# Patient Record
Sex: Male | Born: 1975 | Race: Black or African American | Hispanic: No | Marital: Single | Smoking: Current some day smoker
Health system: Southern US, Community
[De-identification: ages and names within clinical notes are randomized; demographics above are authoritative.]

## PROBLEM LIST (undated history)

## (undated) DIAGNOSIS — J849 Interstitial pulmonary disease, unspecified: Secondary | ICD-10-CM

## (undated) DIAGNOSIS — J9610 Chronic respiratory failure, unspecified whether with hypoxia or hypercapnia: Secondary | ICD-10-CM

## (undated) DIAGNOSIS — I10 Essential (primary) hypertension: Secondary | ICD-10-CM

## (undated) DIAGNOSIS — N529 Male erectile dysfunction, unspecified: Secondary | ICD-10-CM

## (undated) DIAGNOSIS — K859 Acute pancreatitis without necrosis or infection, unspecified: Secondary | ICD-10-CM

## (undated) DIAGNOSIS — E119 Type 2 diabetes mellitus without complications: Secondary | ICD-10-CM

## (undated) DIAGNOSIS — J8289 Other pulmonary eosinophilia, not elsewhere classified: Secondary | ICD-10-CM

## (undated) DIAGNOSIS — Z8701 Personal history of pneumonia (recurrent): Secondary | ICD-10-CM

## (undated) DIAGNOSIS — F319 Bipolar disorder, unspecified: Secondary | ICD-10-CM

## (undated) DIAGNOSIS — R000 Tachycardia, unspecified: Secondary | ICD-10-CM

## (undated) DIAGNOSIS — Z720 Tobacco use: Secondary | ICD-10-CM

## (undated) DIAGNOSIS — E785 Hyperlipidemia, unspecified: Secondary | ICD-10-CM

## (undated) DIAGNOSIS — K219 Gastro-esophageal reflux disease without esophagitis: Secondary | ICD-10-CM

## (undated) DIAGNOSIS — E669 Obesity, unspecified: Secondary | ICD-10-CM

## (undated) HISTORY — DX: Male erectile dysfunction, unspecified: N52.9

## (undated) HISTORY — DX: Essential (primary) hypertension: I10

## (undated) HISTORY — PX: INCISION AND DRAINAGE ABSCESS / HEMATOMA OF BURSA / KNEE / THIGH: SUR668

## (undated) HISTORY — DX: Interstitial pulmonary disease, unspecified: J84.9

## (undated) HISTORY — DX: Hyperlipidemia, unspecified: E78.5

## (undated) HISTORY — DX: Type 2 diabetes mellitus without complications: E11.9

## (undated) HISTORY — DX: Personal history of pneumonia (recurrent): Z87.01

## (undated) HISTORY — PX: APPENDECTOMY: SHX54

## (undated) HISTORY — DX: Bipolar disorder, unspecified: F31.9

## (undated) HISTORY — DX: Gastro-esophageal reflux disease without esophagitis: K21.9

## (undated) HISTORY — PX: KNEE SURGERY: SHX244

---

## 2003-09-11 ENCOUNTER — Emergency Department (HOSPITAL_COMMUNITY): Admission: EM | Admit: 2003-09-11 | Discharge: 2003-09-11 | Payer: Self-pay | Admitting: *Deleted

## 2003-10-20 ENCOUNTER — Ambulatory Visit (HOSPITAL_COMMUNITY): Admission: RE | Admit: 2003-10-20 | Discharge: 2003-10-20 | Payer: Self-pay | Admitting: Pulmonary Disease

## 2003-11-15 ENCOUNTER — Emergency Department (HOSPITAL_COMMUNITY): Admission: EM | Admit: 2003-11-15 | Discharge: 2003-11-15 | Payer: Self-pay | Admitting: Emergency Medicine

## 2004-03-18 ENCOUNTER — Emergency Department (HOSPITAL_COMMUNITY): Admission: EM | Admit: 2004-03-18 | Discharge: 2004-03-18 | Payer: Self-pay | Admitting: Emergency Medicine

## 2004-04-12 ENCOUNTER — Ambulatory Visit (HOSPITAL_COMMUNITY): Admission: RE | Admit: 2004-04-12 | Discharge: 2004-04-12 | Payer: Self-pay | Admitting: Pulmonary Disease

## 2004-05-05 ENCOUNTER — Emergency Department (HOSPITAL_COMMUNITY): Admission: EM | Admit: 2004-05-05 | Discharge: 2004-05-05 | Payer: Self-pay | Admitting: Emergency Medicine

## 2004-05-07 ENCOUNTER — Ambulatory Visit (HOSPITAL_COMMUNITY): Admission: RE | Admit: 2004-05-07 | Discharge: 2004-05-07 | Payer: Self-pay | Admitting: Pulmonary Disease

## 2004-05-14 ENCOUNTER — Emergency Department (HOSPITAL_COMMUNITY): Admission: EM | Admit: 2004-05-14 | Discharge: 2004-05-14 | Payer: Self-pay | Admitting: Emergency Medicine

## 2004-05-27 ENCOUNTER — Emergency Department (HOSPITAL_COMMUNITY): Admission: EM | Admit: 2004-05-27 | Discharge: 2004-05-27 | Payer: Self-pay | Admitting: Emergency Medicine

## 2004-07-06 ENCOUNTER — Inpatient Hospital Stay (HOSPITAL_COMMUNITY): Admission: EM | Admit: 2004-07-06 | Discharge: 2004-07-10 | Payer: Self-pay | Admitting: Emergency Medicine

## 2004-12-28 ENCOUNTER — Inpatient Hospital Stay (HOSPITAL_COMMUNITY): Admission: EM | Admit: 2004-12-28 | Discharge: 2005-01-03 | Payer: Self-pay | Admitting: *Deleted

## 2005-03-17 ENCOUNTER — Emergency Department (HOSPITAL_COMMUNITY): Admission: EM | Admit: 2005-03-17 | Discharge: 2005-03-17 | Payer: Self-pay

## 2006-04-28 ENCOUNTER — Emergency Department (HOSPITAL_COMMUNITY): Admission: EM | Admit: 2006-04-28 | Discharge: 2006-04-28 | Payer: Self-pay | Admitting: Emergency Medicine

## 2006-06-25 ENCOUNTER — Inpatient Hospital Stay (HOSPITAL_COMMUNITY): Admission: EM | Admit: 2006-06-25 | Discharge: 2006-06-29 | Payer: Self-pay | Admitting: *Deleted

## 2006-06-25 ENCOUNTER — Ambulatory Visit: Payer: Self-pay | Admitting: *Deleted

## 2006-07-15 ENCOUNTER — Emergency Department (HOSPITAL_COMMUNITY): Admission: EM | Admit: 2006-07-15 | Discharge: 2006-07-16 | Payer: Self-pay | Admitting: Emergency Medicine

## 2006-07-17 ENCOUNTER — Emergency Department (HOSPITAL_COMMUNITY): Admission: EM | Admit: 2006-07-17 | Discharge: 2006-07-18 | Payer: Self-pay | Admitting: Emergency Medicine

## 2006-07-18 ENCOUNTER — Emergency Department (HOSPITAL_COMMUNITY): Admission: EM | Admit: 2006-07-18 | Discharge: 2006-07-18 | Payer: Self-pay | Admitting: Emergency Medicine

## 2006-07-29 ENCOUNTER — Emergency Department (HOSPITAL_COMMUNITY): Admission: EM | Admit: 2006-07-29 | Discharge: 2006-07-30 | Payer: Self-pay | Admitting: Emergency Medicine

## 2006-08-16 ENCOUNTER — Emergency Department (HOSPITAL_COMMUNITY): Admission: EM | Admit: 2006-08-16 | Discharge: 2006-08-16 | Payer: Self-pay | Admitting: Emergency Medicine

## 2006-08-30 ENCOUNTER — Emergency Department (HOSPITAL_COMMUNITY): Admission: EM | Admit: 2006-08-30 | Discharge: 2006-08-30 | Payer: Self-pay | Admitting: Emergency Medicine

## 2007-05-11 ENCOUNTER — Inpatient Hospital Stay (HOSPITAL_COMMUNITY): Admission: EM | Admit: 2007-05-11 | Discharge: 2007-05-13 | Payer: Self-pay | Admitting: *Deleted

## 2008-03-11 ENCOUNTER — Other Ambulatory Visit: Payer: Self-pay | Admitting: Emergency Medicine

## 2008-03-11 ENCOUNTER — Ambulatory Visit: Payer: Self-pay | Admitting: Psychiatry

## 2008-03-11 ENCOUNTER — Other Ambulatory Visit: Payer: Self-pay

## 2008-03-11 ENCOUNTER — Inpatient Hospital Stay (HOSPITAL_COMMUNITY): Admission: AD | Admit: 2008-03-11 | Discharge: 2008-03-15 | Payer: Self-pay | Admitting: Psychiatry

## 2008-03-12 ENCOUNTER — Other Ambulatory Visit: Payer: Self-pay | Admitting: Emergency Medicine

## 2008-11-29 ENCOUNTER — Emergency Department (HOSPITAL_COMMUNITY): Admission: EM | Admit: 2008-11-29 | Discharge: 2008-11-30 | Payer: Self-pay | Admitting: Emergency Medicine

## 2010-06-22 ENCOUNTER — Encounter: Payer: Self-pay | Admitting: Pulmonary Disease

## 2010-09-08 LAB — BASIC METABOLIC PANEL
BUN: 7 mg/dL (ref 6–23)
CO2: 27 mEq/L (ref 19–32)
Calcium: 9.7 mg/dL (ref 8.4–10.5)
Chloride: 102 mEq/L (ref 96–112)
Creatinine, Ser: 0.72 mg/dL (ref 0.4–1.5)
GFR calc Af Amer: >60 mL/min (ref >60)
GFR calc non Af Amer: >60 mL/min (ref >60)
Glucose, Bld: 131 mg/dL — ABNORMAL HIGH (ref 70–99)
Potassium: 3.5 mEq/L (ref 3.5–5.1)
Sodium: 137 mEq/L (ref 135–145)

## 2010-09-08 LAB — URINALYSIS, ROUTINE W REFLEX MICROSCOPIC
Bilirubin Urine: NEGATIVE
Glucose, UA: NEGATIVE mg/dL
Hgb urine dipstick: NEGATIVE
Ketones, ur: NEGATIVE mg/dL
Nitrite: NEGATIVE
Protein, ur: NEGATIVE mg/dL
Specific Gravity, Urine: 1.02 (ref 1.005–1.030)
Urobilinogen, UA: 1 mg/dL (ref 0.0–1.0)
pH: 8.5 — ABNORMAL HIGH (ref 5.0–8.0)

## 2010-09-08 LAB — DIFFERENTIAL
Basophils Absolute: 0.1 10*3/uL (ref 0.0–0.1)
Basophils Relative: 1 % (ref 0–1)
Eosinophils Absolute: 0.6 10*3/uL (ref 0.0–0.7)
Eosinophils Relative: 6 % — ABNORMAL HIGH (ref 0–5)
Lymphocytes Relative: 23 % (ref 12–46)
Lymphs Abs: 2.3 10*3/uL (ref 0.7–4.0)
Monocytes Absolute: 1.2 10*3/uL — ABNORMAL HIGH (ref 0.1–1.0)
Monocytes Relative: 11 % (ref 3–12)
Neutro Abs: 6 10*3/uL (ref 1.7–7.7)
Neutrophils Relative %: 59 % (ref 43–77)

## 2010-09-08 LAB — CBC
HCT: 39.4 % (ref 39.0–52.0)
Hemoglobin: 14.1 g/dL (ref 13.0–17.0)
MCHC: 35.7 g/dL (ref 30.0–36.0)
MCV: 93.4 fL (ref 78.0–100.0)
Platelets: 160 10*3/uL (ref 150–400)
RBC: 4.22 MIL/uL (ref 4.22–5.81)
RDW: 14.4 % (ref 11.5–15.5)
WBC: 10.1 10*3/uL (ref 4.0–10.5)

## 2010-10-15 NOTE — H&P (Signed)
[name], [name]           ACCOUNT NO.:  [id number]   MEDICAL RECORD NO.:  [id number]          PATIENT TYPE:  INP   LOCATION:  A218                          FACILITY:  [facility]   PHYSICIAN:  Edward L. Juanetta Gosling, M.D.DATE OF BIRTH:  03-Oct-1975   DATE OF ADMISSION:  05/10/2007  DATE OF DISCHARGE:  LH                              HISTORY & PHYSICAL   [name] is a 35 year old African American male who came to the  emergency room with nausea and vomiting and was found to have diabetic  ketoacidosis.  He has a long known history of diabetes.  He has been on  insulin.  He has bipolar disease, so he frequently is noncompliant with  his medications.  He does live with his mother, but she has little  ability to get him to cooperate with medical treatment.  He says he had  been sick for a few days.  He has had nausea and vomiting and just does  not feel well. He has not had any fever.  He has not had any infection,  as far as he knows.   PAST MEDICAL HISTORY:  1. Diabetes.  2. Bipolar disorder.  3. He is had an abscess on his buttocks in the past.   FAMILY HISTORY:  Positive for hypertension, blood dyscrasias, diabetes,  and coronary disease.   PAST SURGICAL HISTORY:  1. He has had an appendectomy.  2. He has had some sort of an orthopedic procedure on his leg.   SOCIAL HISTORY:  He lives at home with his mother.  He does not drink  any alcohol.  He does not use any illicit drugs.  He does smoke about a  package of cigarettes or so a day, and has for several years.   He has no known drug allergies.   PHYSICAL EXAMINATION:  GENERAL:  He is awake and alert.  Says he is more  comfortable.  VITAL SIGNS:  Temperature is 98.1, pulse 116, respirations 20, blood  pressure 135/75, O2 saturations 98% on room air.  His last blood sugar  was 188.  HEENT:  His mucous membranes are slightly dry.  His pupils do react.  His nose and throat are clear otherwise.  NECK:  Supple, without  masses, bruits, or jugular venous distention.  CHEST:  Fairly clear, with some mild rhonchi bilaterally.  HEART:  Regular, without gallop.  ABDOMEN:  Soft.  No masses are felt.  He has minimal tenderness.  No  rebound tenderness.  Bowel sounds are present and active.  EXTREMITIES:  Showed no edema.  CENTRAL NERVOUS SYSTEM:  Grossly intact.   LABORATORY WORK:  CBC shows white count 8700, hemoglobin 16.8, platelets  220.  Urine negative.  Urine microscopic negative.  Comprehensive  metabolic profile showed his blood sugar was 743, BUN of 13, creatinine  1.71, bilirubin is 1.9, alkaline phosphatase 165, lipase 29.  Ketones  positive, small amount.  Chest x-ray not done yet.   ASSESSMENT:  He has diabetic ketoacidosis.  He is a smoker.  I am going  to go ahead and get a chest x-ray to be sure that  he has not had  pneumonia that set this off, although he does not have an elevated white  blood cell count.  He has been on an insulin drip.  He is now going to  be on a sliding scale with some Lantus.  I am going to have him  reevaluated by mental health.  He has not been terribly cooperative with  treatments from them, and I will otherwise see how he does.      Edward L. Juanetta Gosling, M.D.  Electronically Signed     ELH/MEDQ  D:  05/11/2007  T:  05/11/2007  Job:  [id number]

## 2010-10-15 NOTE — Group Therapy Note (Signed)
NAMETRINTON, PREWITT           ACCOUNT NO.:  [id number]   MEDICAL RECORD NO.:  [id number]          PATIENT TYPE:  INP   LOCATION:  A325                          FACILITY:  [facility]   PHYSICIAN:  Edward L. Juanetta Gosling, M.D.DATE OF BIRTH:  15-Jun-1975   DATE OF PROCEDURE:  DATE OF DISCHARGE:                                 PROGRESS NOTE   Mr. Wettstein was admitted with diabetic ketoacidosis.  He had also had  significant nausea and vomiting.  I am going to get a ultrasound of the  abdomen.  He says he is better.  He is not nauseated.  He is not  vomiting.  His blood sugar is better but still in the 200s.  I had a  long discussion with him about what is going on with his bipolar disease  and he says he is having no hallucinations.  He is not having any other  problems.  In the past, when he has had difficulty with hallucinations,  he has freely admitted it, and I do not do not know exactly what is  going on.  His mother, with whom he lives, says that he is having  terrible problems.  He says he is not.  He says he is taking his  insulin.  As best I can tell he has not been evaluated by the ACT team  yet.  I have told him he is going to have to have a psychiatric  evaluation to try to decide for sure what is going on.  I do not think  he is quite ready for discharge but he is getting close.      Edward L. Juanetta Gosling, M.D.  Electronically Signed     ELH/MEDQ  D:  05/12/2007  T:  05/12/2007  Job:  [id number]

## 2010-10-15 NOTE — Group Therapy Note (Signed)
[name], [name]           ACCOUNT NO.:  [id number]   MEDICAL RECORD NO.:  [id number]          PATIENT TYPE:  INP   LOCATION:  A325                          FACILITY:  [facility]   PHYSICIAN:  Edward L. Juanetta Gosling, M.D.DATE OF BIRTH:  [birth year]   DATE OF PROCEDURE:  DATE OF DISCHARGE:                                 PROGRESS NOTE   Mr. [name] seems to be doing better.  He has no new complaints.  His  blood sugars are now around 250, not perfect but certainly better.  He  is not in DKA.  He has had evaluation by the ACT team.  He does not need  to be committed, but they have had some suggestions for his home  situation and for followup with mental health.   EXAM:  Temperature is 98.3, pulse 69, respirations 20, blood pressure  114/66, blood sugar 217.  His chest is clear.  ABDOMEN:  Soft.  He looks very comfortable.   ASSESSMENT:  He is going to be discharged home.  Please see discharge  summary for details.      Edward L. Juanetta Gosling, M.D.  Electronically Signed     ELH/MEDQ  D:  05/13/2007  T:  05/13/2007  Job:  [id number]

## 2010-10-18 NOTE — H&P (Signed)
NAMEEVARISTO, TSUDA           ACCOUNT NO.:  [id number]   MEDICAL RECORD NO.:  [id number]          PATIENT TYPE:  INP   LOCATION:  A213                          FACILITY:  [facility]   PHYSICIAN:  Kingsley Callander. Ouida Sills, MD       DATE OF BIRTH:  12/06/1975   DATE OF ADMISSION:  07/06/2004  DATE OF DISCHARGE:  LH                                HISTORY & PHYSICAL   CHIEF COMPLAINT:  Vomiting.   HISTORY OF THE PRESENT ILLNESS:  This patient is a 35 year old African-  American male with bipolar disorder who presented to the emergency room  after vomiting twice today.  On evaluation, he was found to be hyperglycemia  at 874.  He admits recent polyuria, polydipsia and polyphagia.  He has had  fluctuation in his weight.  He also had a recent abscess on his left buttock  which was incised and drained by Dr. Margretta Ditty here in the emergency room.  He has not experienced fever or diarrhea.  He has felt constipated and has  not had a bowel movement in approximately a week.   PAST MEDICAL HISTORY:  1.  Bipolar disorder.  2.  Appendectomy.  3.  Seizures as a child.  4.  Asthma.   MEDICATIONS:  1.  Risperdal 1 mg b.i.d.  2.  Clonazepam 0.5 mg b.i.d.  3.  Depakote ER 1500 mg at bedtime.  4.  __________ 50 mg q.2w.   ALLERGIES:  None.   SOCIAL HISTORY:  He smokes cigarettes but does not drink alcohol or use  recreational drugs now.   FAMILY HISTORY:  His mother has diabetes.   REVIEW OF SYSTEMS:  Noncontributory.   PHYSICAL EXAMINATION:  VITAL SIGNS:  Temperature 98.4, pulse 101,  respirations 16, blood pressure 153/87.  GENERAL:  Alert and fully oriented.  HEENT:  The eyes are unremarkable.  The oropharynx is dry.  The neck is  supple without JVD or thyromegaly.  LUNGS:  Clear.  HEART:  Tachycardic with no murmurs.  ABDOMEN:  Soft, nontender, with no hepatosplenomegaly.  EXTREMITIES:  No cyanosis, clubbing or edema.  NEUROLOGIC:  Grossly intact.  SKIN:  He has a wound on his left buttock  where he has undergone an incision  and drainage and packing of a boil.   LABORATORY DATA:  Sodium 120, potassium 4.6, chloride 80, bicarb 27, glucose  874, BUN 13, creatinine 1.4, calcium 9.4, albumin 4.1.  Urinalysis reveals  trace ketones and no protein.  White count 7.9, hemoglobin 15.2, platelets  208.   IMPRESSION:  1.  New-onset type 2 diabetes.  He is being hydrated with normal saline, an      insulin drip has been started, he will be hospitalized and monitored      closely with hourly Accu-Cheks and modification of his insulin doses and      fluids.  His MET-7 will be repeated.  2.  Left buttock boil.  We will continue dressing changes and obtain surgery      consultation if needed.  3.  Bipolar disorder.  Continue Risperdal and Depakote ER.  4.  Smoker.  A Nicoderm patch will be provided.  5.  History of seizures.  6.  History of asthma.  7.  Hyponatremia.      ROF/MEDQ  D:  07/06/2004  T:  07/06/2004  Job:  [id number]   cc:   Ramon Dredge L. Juanetta Gosling, M.D.  [address]  [place]  [place] [address]  Fax: [phone]

## 2010-10-18 NOTE — Discharge Summary (Signed)
NAMEAMAL, [name]           ACCOUNT NO.:  [id number]   MEDICAL RECORD NO.:  [id number]          PATIENT TYPE:  INP   LOCATION:  A325                          FACILITY:  [facility]   PHYSICIAN:  Edward L. Juanetta Gosling, M.D.DATE OF BIRTH:  [birth year]   DATE OF ADMISSION:  05/10/2007  DATE OF DISCHARGE:  12/11/2008LH                               DISCHARGE SUMMARY   FINAL DISCHARGE DIAGNOSES:  1. Diabetic ketoacidosis.  2. Longstanding history of noncompliance with medications.  3. Bipolar disease.  4. History of abscess of the buttocks.  5. Nausea and vomiting likely related to DKA.  6. Mild acute renal insufficiency   HISTORY OF PRESENT ILLNESS:  Mr. [name] is a 35 year old African  American male who came to the emergency room and nausea, vomiting and  was found to be in DKA.  He has a long known history of diabetes. He has  been on insulin,  but he has bipolar disease so he is frequently  noncompliant with his medications both for his bipolar disease and for  his diabetes.  He has not had any recent active hallucinations, but he  has been uncooperative with his care.  He does live at home with his  mother and they have been budding head  over his care and he has  become resistant to any of her efforts to try to make him take his  medications.   PHYSICAL EXAMINATION:  Exam on admission:  VITAL SIGNS:  Temperature  is 98.1, pulse 116, respirations 20, blood  pressure 135/75, O2 sat 98% on room air.  HEENT: Mucous membranes were dry.  Pupils are reactive.  CHEST:  His chest showed some mild rhonchi bilaterally.  HEART:  Heart was regular without gallop.  ABDOMEN:  His abdomen was soft.  No real tenderness.  No evidence of any  sort of obstruction.   LABORATORY DATA:  Lab work white count 8700, hemoglobin 16.8, platelets  were 220,000.  His blood sugar was 743 on admission. BUN 13, creatinine  1.71  ketones were positive.   HOSPITAL COURSE:  He was started on insulin and showed  marked  improvement.  His blood sugar came under control very quickly.  He was  taken off of the insulin drip.  The rest of his hospitalization was  spent trying to make sure that he was safe at home. He had an evaluation  by the ACT team and it was felt that he was not suicidal. He did not  need to be committed and he was therefore discharged home on the 11th  to continue with his previous medications at home, if he will take them,  which of course is somewhat questionable.  He may need to be on Lantus.  He has been on a sliding scale of regular insulin, but this just does  not appear to me that we are going to have a good  solution to his problem.  I do not think he is competent, but I do think  he is resistant to taking his medications.  He is going to be on 70/30  insulin, 70 units b.i.d.  He  is going to be back on Depakote that he  takes through the health department. He had been receiving long-acting  Risperdal through my office and I am hopeful that he will take that.      Edward L. Juanetta Gosling, M.D.  Electronically Signed     ELH/MEDQ  D:  05/28/2007  T:  05/28/2007  Job:  [id number]

## 2010-10-18 NOTE — Group Therapy Note (Signed)
NAMEJACQUEZ, [name]           ACCOUNT NO.:  [id number]   MEDICAL RECORD NO.:  [id number]          PATIENT TYPE:  INP   LOCATION:  A215                          FACILITY:  [facility]   PHYSICIAN:  Edward L. Juanetta Gosling, M.D.DATE OF BIRTH:  10-05-1975   DATE OF PROCEDURE:  12/31/2004  DATE OF DISCHARGE:                                   PROGRESS NOTE   PROBLEM LIST:  1.  Diabetes.  2.  Bipolar disease.   SUBJECTIVE:  [name] says he is feeling a little better.  His blood  sugar, however, is still not controlled.  He has no new complaints.   OBJECTIVE:  VITAL SIGNS:  His temperature 97, pulse 77, respirations 20,  blood sugar 282.  It has been 280, then 282.  Blood pressure 121/64.  CHEST:  His chest is clear.  HEART:  Regular.  He looks more comfortable.   ASSESSMENT:  He has diabetes which is not well-controlled.   PLAN:  Continue his current treatments.  Increase his insulin by 5 units in  the morning and evening and continue sliding scale diabetic teaching,  although I am not sure how much he will absorb of that considering his  severe bipolar disease.       ELH/MEDQ  D:  12/31/2004  T:  12/31/2004  Job:  [id number]

## 2010-10-18 NOTE — Op Note (Signed)
[name], [name]           ACCOUNT NO.:  [id number]   MEDICAL RECORD NO.:  [id number]          PATIENT TYPE:  INP   LOCATION:  A213                          FACILITY:  [facility]   PHYSICIAN:  Barbaraann Barthel, M.D. DATE OF BIRTH:  12-31-1975   DATE OF PROCEDURE:  07/08/2004  DATE OF DISCHARGE:                                 OPERATIVE REPORT   PREOPERATIVE DIAGNOSIS:  Gluteal abscess, left gluteus.   POSTOPERATIVE DIAGNOSIS:  Gluteal abscess, left gluteus.   PROCEDURE:  Incision and drainage and debridement of left gluteal abscess.   SURGEON:  Barbaraann Barthel, M.D.   SPECIMENS:  Cultures.   NOTE:  This is a 35 year old black male who presented to the emergency room  with a gluteal abscess, and he was seen by the medical service for a recent  diagnosis of diabetes for management.  He had an abscess on his left gluteus  partially drained.  It required further drainage.  We made arrangements and  discussed this with the patient and his mother.  We discussed the  complications not limited to but including bleeding, infection, the  possibility that more debridement may be required.  Informed consent was  obtained.  The patient was begun on antibiotics, and we planned to take him  down to the minor room in the surgery suite for treatment.   TECHNIQUE:  The patient was placed in a prone position.  After prepping with  Betadine solution and draping in the usual manner, 1% Xylocaine was used to  anesthetize the area around the wound.  The wound was then extended and  opened in a T-like manner in order to remove the loculations that were  superior.  These were then debrided sharply and the wound was irrigated with  normal saline solution and the bleeding was controlled with the cautery  device.  The wound was then packed open with iodoform gauze and a sterile  dressing was applied.  Prior to closure, all sponge, needle and instrument  counts were found to be correct.  Estimated blood  loss was minimal.  The  patient received 8 mL of 1% Xylocaine without epinephrine.  There were no  complications.   The patient is returned to the floor without a change of service, and Dr.  Juanetta Gosling will continue to see him and treat him for his diabetes, etc.      WB/MEDQ  D:  07/08/2004  T:  07/08/2004  Job:  [id number]   cc:   Ramon Dredge L. Juanetta Gosling, M.D.  [address]  [place]  [place] [address]  Fax: [phone]

## 2010-10-18 NOTE — Consult Note (Signed)
NAMEYEHIA, MCBAIN NO.:  [id number]   MEDICAL RECORD NO.:  [id number]           PATIENT TYPE:  INP   LOCATION:  A213                          FACILITY:  [facility]   PHYSICIAN:  Barbaraann Barthel, M.D. DATE OF BIRTH:  1975/08/06   DATE OF CONSULTATION:  07/08/2004  DATE OF DISCHARGE:                                   CONSULTATION   Surgery was asked to see this 35 year old black male who has an abscess on  his left gluteus.  In essence, he is a type 2 diabetic on a sliding scale at  the present, who was admitted to the medical service and he was found to  have an abscess on his left gluteal area.  This was seen in the emergency  room and partially drained.  He does not recall how long he has had this  abscess.  This was not completely drained and packed  in the emergency room.  This needs further debridement.  We can do this under local anesthesia in  the minor room and I have discussed this with him.  An informed consent was  obtained.   PAST MEDICAL HISTORY:  Please see Dr. Juanetta Gosling admission note.  But in  essence, I remember this patient from 18 when he presented to me with  acute appendicitis.  Other medical problems have included a history of  seizure disorder and bipolar problems.   PLAN:  We will initiate antibiotics at this time and plan for an I&D in the  minor room down in surgery as soon as this is available to me.      WB/MEDQ  D:  07/08/2004  T:  07/08/2004  Job:  [id number]   cc:   Ramon Dredge L. Juanetta Gosling, M.D.  [address]  [place]  [place] [address]  Fax: [phone]

## 2010-10-18 NOTE — Group Therapy Note (Signed)
[name], [name]           ACCOUNT NO.:  [id number]   MEDICAL RECORD NO.:  [id number]          PATIENT TYPE:  INP   LOCATION:  A215                          FACILITY:  [facility]   PHYSICIAN:  Edward L. Juanetta Gosling, M.D.DATE OF BIRTH:  03-15-1976   DATE OF PROCEDURE:  12/30/2004  DATE OF DISCHARGE:                                   PROGRESS NOTE   PROBLEM:  Diabetes with DKA.   SUBJECTIVE:  [name] says he feels much better.  He is able to eat  now.  He has been able to keep some medicine down.   OBJECTIVE:  VITAL SIGNS:  His temperature 97.8, pulse 107, respirations 18.  Blood sugars 273.  Blood pressure 111/62.  CHEST:  Clear.  HEART:  Regular.  ABDOMEN:  Soft.   Liver function this morning normal.  CBC shows white count 8700, hemoglobin  17.  BMET shows his sodium is 130, potassium 3.2, CO2 is 20, which is  normal, BUN 13, creatinine 1.3.   ASSESSMENT:  Even though he does have elevated blood sugars, he appears to  be out of the diabetic ketoacidosis.   PLAN:  I am going to go ahead with reducing and tapering off the insulin  drip.  Replace his potassium.  Try to get him up and moving around.  Plans  for home are problematic because of his mental illness and his poor ability  to manage, but we will see what help we can get him at home.       ELH/MEDQ  D:  12/30/2004  T:  12/30/2004  Job:  [id number]

## 2010-10-18 NOTE — Discharge Summary (Signed)
NAMELEVONE, OTTEN           ACCOUNT NO.:  [id number]   MEDICAL RECORD NO.:  [id number]          PATIENT TYPE:  INP   LOCATION:  A213                          FACILITY:  [facility]   PHYSICIAN:  Edward L. Juanetta Gosling, M.D.DATE OF BIRTH:  07-11-1975   DATE OF ADMISSION:  07/06/2004  DATE OF DISCHARGE:  02/08/2006LH                                 DISCHARGE SUMMARY   FINAL DISCHARGE DIAGNOSES:  1.  New onset diabetes.  2.  Abscess of buttocks.  3.  Bipolar disorder.  4.  Hyponatremia.   HISTORY:  This is a 35 year old who has a known history of bipolar disorder,  who came to the emergency room after vomiting.  When he was seen in the  emergency room, he was hyperglycemia at 874.  He has had polyuria,  polydipsia, and polyphagia.  He has had some marked fluctuation in his  weight.  He developed an abscess on his left buttock which was incised and  drained in the emergency room.  He has had his blood sugar checked several  times because of a very positive family history of diabetes.   He is taking:  1.  Risperdal 1 mg b.i.d.  2.  Clonazepam 0.5 mg b.i.d.  3.  Depakote ER 1500 mg at bedtime.  4.  A long-acting version of Risperdal 50 mg every two weeks.   PHYSICAL EXAMINATION:  VITAL SIGNS:  Show his temp is 98.4, pulse 101,  respirations 16, blood pressure 153/87.  CHEST:  Clear.  HEART:  Tachycardic.  No murmurs.  ABDOMEN:  Soft.  BACK: He had a wound on his left buttock where he had the incision and  drainage done.   LABORATORY WORK:  Showed a sodium of 120, potassium 4.6, chloride 80, bicarb  27, glucose 874, BUN 13, creatinine 1.4.  White blood count 7,900,  hemoglobin 15.2.   Assessment at the time of admission was that he had new onset type 2  diabetes.  He was treated with intravenous fluids and given insulin drip.  He was put on a Nicoderm patch.  He was improved over the next several days.  His blood sugar came down into the 200-400 range, but he became very anxious  about being in the hospital and even though it was not the ideal situation,  I went ahead and discharged him home with home health services and to have  his mother who is also diabetic to help with management of his diabetes.  I  was concerned that he would check out AMA if I did not go ahead and  discharge him.  At least this way, he will have a good followup.   He is discharged home on:  1.  Humulin N 30 units in the morning and 25 units in the evening.  2.  Humalog by sliding scale:  Greater than 450 - 12 units; 400 to 450 - 10      units; 350 to 399 - 8 units; 300 to 349 - 6 units; 250 to 299 - 4 units;      200 to 249 - 2 units; less than 200 -  0 units.  3.  He is going to take Darvocet-N 100 one q.6h. p.r.n. pain.  4.  Cipro 500 mg b.i.d. x 5 days.   He is going to have home health followup.   He is going to check his blood sugars four times a day.  He has an Accu-Chek  machine.  He knows how to use it.   He is going to be on an 1800 calorie ADA diet and then follow up after that.      ELH/MEDQ  D:  07/10/2004  T:  07/10/2004  Job:  [id number]

## 2010-10-18 NOTE — Group Therapy Note (Signed)
NAMEJAYLON, BOYLEN           ACCOUNT NO.:  [id number]   MEDICAL RECORD NO.:  [id number]          PATIENT TYPE:  INP   LOCATION:  A306                          FACILITY:  [facility]   PHYSICIAN:  Edward L. Juanetta Gosling, M.D.DATE OF BIRTH:  10/13/1975   DATE OF PROCEDURE:  DATE OF DISCHARGE:  01/03/2005                                   PROGRESS NOTE   PROBLEM:  Diabetes.   SUBJECTIVE:  Mr. Ricketson says he feels much better.  He has no new  complaints.   His exam shows that his temperature is 97.7, pulse is 72, respirations are  20.  His blood sugar this morning 135.  Blood pressure 109/63.  O2 sats 99%  on room air.  His chest is clear.  His heart is regular. His abdomen is  soft.   His ultrasound was negative.   ASSESSMENT:  He is much improved.  Bipolar disease  does not appear to be  causing hallucinations, at least now.   My plan is to discharge him home.  Please see discharge summary for details.       ELH/MEDQ  D:  01/03/2005  T:  01/03/2005  Job:  [id number]
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
## 2010-10-18 NOTE — Group Therapy Note (Signed)
NAMEOTIS, PORTAL           ACCOUNT NO.:  [id number]   MEDICAL RECORD NO.:  [id number]          PATIENT TYPE:  INP   LOCATION:  A306                          FACILITY:  [facility]   PHYSICIAN:  Edward L. Juanetta Gosling, M.D.DATE OF BIRTH:  Sep 05, 1975   DATE OF PROCEDURE:  01/01/2005  DATE OF DISCHARGE:                                   PROGRESS NOTE   PROBLEM:  Diabetes.   SUBJECTIVE:  Mr. Latour overall is about the same.  His blood sugar this  morning is 216, so he is getting closer to being controlled.  He says that  his mother was concerned that he his abdomen appears to be enlarged.  His  abdomen is soft.  He does not have any peripheral edema, but he does  complain of some constipation, perhaps he has just not evacuated his bowels.  His mother also wants him switch to something that he can take one shot a  day which I think may be possible once he gets home and gets stabilized, but  right now he is on two long-acting injections plus sliding scale and he is  not controlled, so I don't think that switching him to something like Lantus  is feasible or advisable.   PHYSICAL EXAMINATION:  GENERAL:  His exam shows he is awake and alert.  VITAL SIGNS:  Temperature is 98.2, pulse 81, respirations 20, blood sugar  216, blood pressure 116/67, O2 saturations 99%.   ASSESSMENT:  She is doing well.   PLAN:  Plan is to continue treatments and follow.  Hopefully, his sugars  will come around the 200 mark all the time and I will be able to let him go  home soon.       ELH/MEDQ  D:  01/01/2005  T:  01/01/2005  Job:  [id number]

## 2010-10-18 NOTE — Group Therapy Note (Signed)
NAMEBERTEL, VENARD           ACCOUNT NO.:  [id number]   MEDICAL RECORD NO.:  [id number]          PATIENT TYPE:  INP   LOCATION:  A213                          [facility]ACILITY:  [facility]   PHYSICIAN:  Edward L. Juanetta Gosling, M.D.DATE OF BIRTH:  1975-06-03   DATE OF PROCEDURE:  DATE OF DISCHARGE:                                   PROGRESS NOTE   Mr. Melucci is about the same.  He is still hyperglycemic but better.  He  has no new complaints.  His temp is 97.4, pulse 75, respirations 18.  Blood  sugar this morning is 244.  Blood pressure 133/76.  His chest is much  clearer.  His heart is regular.  His BMET this morning shows potassium is up  to 4, and sodium is 133.   ASSESSMENT:  He is better.   PLAN:  I am going to discontinue the insulin drip, try go get him on some  long-acting insulin and see if he tolerates this.  I do not have any new  treatment planned.  I will ask Dr. Malvin Johns to see him for his abscess on  his buttocks.      ELH/MEDQ  D:  07/08/2004  T:  07/08/2004  Job:  [id number]

## 2010-10-18 NOTE — Group Therapy Note (Signed)
[name]           ACCOUNT NO.:  [id number]   MEDICAL RECORD NO.:  [id number]          PATIENT TYPE:  INP   LOCATION:  A213                          [facility]ACILITY:  [facility]   PHYSICIAN:  [name], M.D.DATE OF BIRTH:  02-21-1976   DATE OF PROCEDURE:  07/09/2004  DATE OF DISCHARGE:                                   PROGRESS NOTE   PROBLEM:  Diabetes, abscess on buttocks.   SUBJECTIVE:  [name] says he feels better this morning but he is still  having some problems.   PHYSICAL EXAMINATION:  VITAL SIGNS:  Blood pressure 113/65, pulse 69,  respirations 20, temperature 97.5.  His blood sugar 268, was 422.  I want to  add increasing doses of Humulin N today.  He is to receive his diabetic  teaching, start using his own monitor, etc.  PT has been consulted for help  with his buttocks.  Otherwise, will plan to continue with his treatments and  no other changes today.  CHEST:  Fairly clear without wheezes.  HEART:  Regular.  ABDOMEN:  Soft.   ASSESSMENT:  He is better.   PLAN:  Continue with his treatments and follow.      ELH/MEDQ  D:  07/09/2004  T:  07/09/2004  Job:  [id number]

## 2010-10-18 NOTE — Discharge Summary (Signed)
[name], [name]           ACCOUNT NO.:  [id number]   MEDICAL RECORD NO.:  [id number]          PATIENT TYPE:  INP   LOCATION:  A306                          [facility]ACILITY:  [facility]   PHYSICIAN:  Edward L. Juanetta Gosling, M.D.DATE OF BIRTH:  11-08-1975   DATE OF ADMISSION:  12/28/2004  DATE OF DISCHARGE:  08/04/2006LH                                 DISCHARGE SUMMARY   FINAL DISCHARGE DIAGNOSES:  1.  Diabetic ketoacidosis.  2.  Bipolar disorder.  3.  Tobacco abuse.   HISTORY OF PRESENT ILLNESS:  [name] is a 35 year old who has history  of diabetes that was diagnosed February 2006.  He had an abscess on his left  leg at that time that was incised and drained.  He was discharged home, was  on insulin, but somehow misunderstood instructions and was thought that he  was told that he should stop his insulin.  He had stopped his insulin for  about a month, came to my office, was found to have a blood sugar then of  about 400 on no medications.  He was started on Glucovance, but continued  having problems and came to the emergency room.  He was also given  Phenergan.  He had been seen at [facility] with his problems with his  bipolar disease, but he has hallucinations which are active with his bipolar  disease.  He has been receiving Risperdal injections in my office, but he is  not following with a psychiatrist at this point.   PHYSICAL EXAMINATION:  GENERAL APPEARANCE:  Well-developed, well-nourished,  mildly obese male who is in no acute distress.  VITAL SIGNS:  Blood pressure 133/84, weight 250.3, pulse 80, temperature  97.6.  HEENT:  Mucous membranes moist.  NECK:  Supple.  CHEST:  Decreased breath sounds.  HEART:  Regular.  ABDOMEN:  Obese, soft, nontender.  He had no edema.  Pulses were normal.  NEUROLOGICAL:  Normal except for his hallucinations.   LABORATORY DATA:  Blood sugar was very elevated with a low CO2 suggesting  BKA.  He was admitted and started on insulin drip.  His blood sugars came  under better control, and then he was titrated with Humulin N.  By the time  of discharge, his fasting blood sugar was in the 140's, and he is going to  be discharged home on Humulin N 45  units b.i.d.  He is going to continue with a sliding scale of Humulin R that  he already has at home.  He is going to check his blood sugars four times a  day.  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       ELH/MEDQ  D:  01/03/2005  T:  01/03/2005  Job:  [id number]

## 2010-10-18 NOTE — Group Therapy Note (Signed)
[name], [name]           ACCOUNT NO.:  [id number]   MEDICAL RECORD NO.:  [id number]          PATIENT TYPE:  INP   LOCATION:  A213                          [facility]ACILITY:  [facility]   PHYSICIAN:  Edward L. Juanetta Gosling, M.D.DATE OF BIRTH:  12/02/1975   DATE OF PROCEDURE:  07/07/2004  DATE OF DISCHARGE:                                   PROGRESS NOTE   SUBJECTIVE:  [name] came in yesterday with new onset diabetes.  He  has an abscess on his buttocks as well.  He says he is feeling a little  better now.  He has no new complaints.   PHYSICAL EXAMINATION:  VITAL SIGNS:  Temperature 97.8, pulse 82,  respirations 18, blood sugar was 317, blood pressure 106/66.  CHEST:  Relatively clear.  The abscessed area looks apparently better than  before.   LABORATORY DATA:  This morning, his sodium is 123, potassium 3.4, glucose  408 on the BMET, BUN 11, creatinine 1.   ASSESSMENT:  He has diabetes with very high blood sugars.  He is  hypokalemic.  He has mental illness which may be bipolar disease may be  schizophrenia, and he has a buttocks abscess.  I have discussed this with  him and told him that I think he is going to need to have an evaluation of  his abscess by a surgeon.  He is going to wait and talk to his family about  who they want to see him.  In the meantime, we will do wet-to-dry dressings,  get some teaching, continue with his insulin and continuous infusion,  replace his potassium, recheck labs tomorrow.      ELH/MEDQ  D:  07/07/2004  T:  07/07/2004  Job:  [id number]

## 2010-10-18 NOTE — H&P (Signed)
[name], [name]           ACCOUNT NO.:  [id number]   MEDICAL RECORD NO.:  [id number]          PATIENT TYPE:  INP   LOCATION:  A215                          [facility]ACILITY:  [facility]   PHYSICIAN:  [name], M.D.DATE OF BIRTH:  01/16/1976   DATE OF ADMISSION:  12/28/2004  DATE OF DISCHARGE:  LH                                HISTORY & PHYSICAL   HISTORY OF PRESENT ILLNESS:  The patient is a 35 year old African gentleman  who carries the diagnosis of diabetes mellitus diagnosed in February of 2006  when he presented to the hospital with a glucose of 874, and at that time  had a left buttock abscess which was incised and drained. He was discharged  home to be followed as an outpatient. He was apparently discharged on  Insulin which the patient stated he took for a while but then discontinued  it as his sugars were controlled. Apparently he lives with his mother who  assists with giving him his medication. He also carries a diagnosis of  bipolar disorder though the patient describes hallucinations which are  persistent despite his medications. He is followed by Mental Health as well,  though I am not sure he is compliant with follow up. In any event, he  presented to the emergency room with a several-day history of nausea and  vomiting, abdominal pain. He was seen by his primary care physician on the  day prior to admission and started on Glucovance. He was given Phenergan as  well but his symptoms persisted. The patient also gives a history of poor  appetite, fatigue, polyuria and decreased p.o. intake over the last couple  of weeks and weight loss as well.   In any event, he presented to the emergency room where his glucose was 596.  A CO2 was 16, his pH was slightly low at 7.34, and he is admitted to the  hospital with diabetic ketoacidosis. The patient was orthostatic at the time  of presentation. There was no fever. He is started on IV fluids and an  Insulin drip. The  patient has little insight into his medical issues. He  continues to smoke a pack per day. He denies any alcohol or tobacco use. He  lives with his mother. He is single and has no children. He has no siblings.  He is not employed.   SURGERIES:  Appendectomy.   FAMILY HISTORY:  Pertinent for a father with coronary artery disease,  apparently in his 59's. Mother with diabetes.   REVIEW OF SYSTEMS:  He is independent. He describes his hallucination as  cartoon stick figures carrying on a conversation, and script continuously  in his peripheral vision even as we are talking. These figures do not talk  to him or tell him anything to do. He is not afraid but he cannot control  them. He states the medications have been of no benefit. He was previously  on Depakote. He states he takes no medications now but his mother apparently  continues to give him p.r.n. Klonopin and Risperdal b.i.d. though he is  unaware. He is also getting Risperdal-Depo shots. The  patient otherwise  denies any GI, GU complaints up until the last 2 days. He has no shortness  of breath, cough, chest pain. He states he has chronic headaches. He states  no mental status changes. He has had no edema. Usually his appetite is good  up until the last couple of weeks.   PHYSICAL EXAMINATION:  GENERAL:  Well-developed, well-nourished young male  in no distress.  VITAL SIGNS:  Blood pressure sitting is 133/84. Weight 250.3. Pulse is in  the 80's now and regular. Temperature is 97.6.  HEENT:  His oropharynx is moist, teeth are in good repair.  NECK:  Supple, no JVD, adenopathy, or thyromegaly.  LUNGS:  Diminished but clear.  HEART:  Regular, no murmur or gallop.  ABDOMEN:  Obese, soft, nontender to deep palpation.  EXTREMITIES:  Without clubbing, cyanosis or edema. He has good dorsalis  pedis and posterior tibial pulses.  SKIN:  Without rash, lesion or break down.  NEUROLOGIC EXAM:  Except for the hallucinations as described  above, his  affect is appropriate, alert and pleasant, cooperative. No focal signs are  noted.  BUTTOCK:  Reveals a heaped up scar in the area of his I&D but no  fluctuance, tenderness or induration.   ASSESSMENT/PLAN:  1. Diabetic ketoacidosis.  2. Diabetes mellitus, untreated.  3. Bipolar disorder versus other. I am not sure what is going on here.      Certainly Mental Health follow up would be of benefit. Will continue      his medications as his mother says he has been taking.  4. Tobacco abuse. This was discussed with the patient, particularly in      light of his diagnosis.   The patient will be admitted to the hospital with treatment of his DKA and  monitoring of his electrolytes. He will resume Insulin, Lantus may be a good  choice for this gentleman.  It would be reasonable to see if he has reasonable Insulin levels and then  he may be classified as type 2 and would be able to tolerate oral agents in  additional to basal Insulin. This will be deferred to his primary care  physician.       FED/MEDQ  D:  12/29/2004  T:  12/29/2004  Job:  [id number]   cc:   [name], M.D.  [address]  [place]  [place] [address]  Fax: [phone]
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
## 2011-03-03 LAB — RAPID URINE DRUG SCREEN, HOSP PERFORMED
Amphetamines: NOT DETECTED
Barbiturates: NOT DETECTED
Benzodiazepines: NOT DETECTED
Cocaine: NOT DETECTED
Opiates: NOT DETECTED
Tetrahydrocannabinol: NOT DETECTED

## 2011-03-03 LAB — GLUCOSE, CAPILLARY
Glucose-Capillary: 325 — ABNORMAL HIGH
Glucose-Capillary: 355 — ABNORMAL HIGH
Glucose-Capillary: 174 — ABNORMAL HIGH
Glucose-Capillary: 207 — ABNORMAL HIGH
Glucose-Capillary: 219 — ABNORMAL HIGH
Glucose-Capillary: 222 — ABNORMAL HIGH
Glucose-Capillary: 225 — ABNORMAL HIGH
Glucose-Capillary: 235 — ABNORMAL HIGH
Glucose-Capillary: 269 — ABNORMAL HIGH
Glucose-Capillary: 270 — ABNORMAL HIGH
Glucose-Capillary: 274 — ABNORMAL HIGH
Glucose-Capillary: 275 — ABNORMAL HIGH

## 2011-03-03 LAB — CBC
HCT: 46.3
Hemoglobin: 15.6
MCHC: 33.8
MCV: 93.6
Platelets: 163
RBC: 4.94
RDW: 13.5
WBC: 11.2 — ABNORMAL HIGH

## 2011-03-03 LAB — DIFFERENTIAL
Basophils Absolute: 0.2 — ABNORMAL HIGH
Basophils Relative: 1
Eosinophils Absolute: 0.7
Eosinophils Relative: 6 — ABNORMAL HIGH
Lymphocytes Relative: 33
Lymphs Abs: 3.7
Monocytes Absolute: 0.9
Monocytes Relative: 8
Neutro Abs: 5.7
Neutrophils Relative %: 51

## 2011-03-03 LAB — BASIC METABOLIC PANEL
BUN: 10
CO2: 24
Calcium: 9.4
Chloride: 103
Creatinine, Ser: 0.74
GFR calc Af Amer: >60
GFR calc non Af Amer: >60
Glucose, Bld: 281 — ABNORMAL HIGH
Potassium: 4.1
Sodium: 136

## 2011-03-03 LAB — VALPROIC ACID LEVEL: Valproic Acid Lvl: <10 — ABNORMAL LOW

## 2011-03-03 LAB — ETHANOL: Alcohol, Ethyl (B): <5

## 2011-03-04 LAB — COMPREHENSIVE METABOLIC PANEL
Albumin: 3.4 — ABNORMAL LOW
Alkaline Phosphatase: 107
BUN: 14
Calcium: 9
Glucose, Bld: 181 — ABNORMAL HIGH
Potassium: 4.6
Total Protein: 6.8

## 2011-03-04 LAB — DIFFERENTIAL
Basophils Relative: 2 — ABNORMAL HIGH
Lymphocytes Relative: 27
Lymphs Abs: 2.4
Monocytes Absolute: 0.8
Monocytes Relative: 9
Neutro Abs: 5.1
Neutrophils Relative %: 56

## 2011-03-04 LAB — GLUCOSE, CAPILLARY: Glucose-Capillary: 189 — ABNORMAL HIGH

## 2011-03-04 LAB — CBC
HCT: 45.6
Hemoglobin: 15.2
MCHC: 33.2
Platelets: 188
RDW: 13.4

## 2011-03-10 LAB — DIFFERENTIAL
Eosinophils Absolute: 0.1 — ABNORMAL LOW
Eosinophils Relative: 1
Lymphocytes Relative: 24
Lymphs Abs: 2.1
Monocytes Absolute: 0.9

## 2011-03-10 LAB — BASIC METABOLIC PANEL
CO2: 28
CO2: 29
Chloride: 94 — ABNORMAL LOW
Chloride: 99
GFR calc Af Amer: >60
Glucose, Bld: 294 — ABNORMAL HIGH
Glucose, Bld: 383 — ABNORMAL HIGH
Potassium: 4.2
Sodium: 129 — ABNORMAL LOW
Sodium: 137

## 2011-03-10 LAB — COMPREHENSIVE METABOLIC PANEL
ALT: 22
AST: 20
Albumin: 3.7
CO2: 24
Chloride: 86 — ABNORMAL LOW
Creatinine, Ser: 1.71 — ABNORMAL HIGH
GFR calc Af Amer: 58 — ABNORMAL LOW
Sodium: 131 — ABNORMAL LOW
Total Bilirubin: 1.9 — ABNORMAL HIGH

## 2011-03-10 LAB — URINE MICROSCOPIC-ADD ON: Urine-Other: NONE SEEN

## 2011-03-10 LAB — CBC
MCV: 90.1
Platelets: 220
RBC: 5.61
WBC: 8.7

## 2011-03-10 LAB — URINALYSIS, ROUTINE W REFLEX MICROSCOPIC
Bilirubin Urine: NEGATIVE
Leukocytes, UA: NEGATIVE
Nitrite: NEGATIVE
Specific Gravity, Urine: <1.005 — ABNORMAL LOW
Urobilinogen, UA: 0.2
pH: 5.5

## 2013-05-28 ENCOUNTER — Emergency Department (HOSPITAL_COMMUNITY): Payer: Medicaid Other

## 2013-05-28 ENCOUNTER — Encounter (HOSPITAL_COMMUNITY): Payer: Self-pay | Admitting: Emergency Medicine

## 2013-05-28 ENCOUNTER — Emergency Department (HOSPITAL_COMMUNITY)
Admission: EM | Admit: 2013-05-28 | Discharge: 2013-05-29 | Disposition: A | Discharge status: Home / Self Care | Payer: Medicaid Other | Attending: Emergency Medicine | Admitting: Emergency Medicine

## 2013-05-28 DIAGNOSIS — E119 Type 2 diabetes mellitus without complications: Secondary | ICD-10-CM | POA: Insufficient documentation

## 2013-05-28 DIAGNOSIS — F17200 Nicotine dependence, unspecified, uncomplicated: Secondary | ICD-10-CM | POA: Insufficient documentation

## 2013-05-28 DIAGNOSIS — Z79899 Other long term (current) drug therapy: Secondary | ICD-10-CM | POA: Insufficient documentation

## 2013-05-28 DIAGNOSIS — K859 Acute pancreatitis without necrosis or infection, unspecified: Secondary | ICD-10-CM | POA: Insufficient documentation

## 2013-05-28 DIAGNOSIS — M549 Dorsalgia, unspecified: Secondary | ICD-10-CM | POA: Insufficient documentation

## 2013-05-28 DIAGNOSIS — Z794 Long term (current) use of insulin: Secondary | ICD-10-CM | POA: Insufficient documentation

## 2013-05-28 HISTORY — DX: Type 2 diabetes mellitus without complications: E11.9

## 2013-05-28 LAB — COMPREHENSIVE METABOLIC PANEL
ALT: 15 U/L (ref 0–53)
AST: 11 U/L (ref 0–37)
Albumin: 3.8 g/dL (ref 3.5–5.2)
Calcium: 10.2 mg/dL (ref 8.4–10.5)
Creatinine, Ser: 0.68 mg/dL (ref 0.50–1.35)
Sodium: 129 mEq/L — ABNORMAL LOW (ref 135–145)
Total Bilirubin: 0.3 mg/dL (ref 0.3–1.2)
Total Protein: 9 g/dL — ABNORMAL HIGH (ref 6.0–8.3)

## 2013-05-28 LAB — CBC WITH DIFFERENTIAL/PLATELET
Basophils Absolute: 0.1 10*3/uL (ref 0.0–0.1)
Basophils Relative: 1 % (ref 0–1)
Eosinophils Absolute: 0.5 10*3/uL (ref 0.0–0.7)
Eosinophils Relative: 4 % (ref 0–5)
Lymphocytes Relative: 27 % (ref 12–46)
MCH: 32.6 pg (ref 26.0–34.0)
MCHC: 35.8 g/dL (ref 30.0–36.0)
MCV: 91.3 fL (ref 78.0–100.0)
Neutrophils Relative %: 61 % (ref 43–77)
Platelets: 219 10*3/uL (ref 150–400)
RDW: 13 % (ref 11.5–15.5)
WBC: 13.5 10*3/uL — ABNORMAL HIGH (ref 4.0–10.5)

## 2013-05-28 MED ORDER — ONDANSETRON HCL 4 MG/2ML IJ SOLN
4.0000 mg | Freq: Once | INTRAMUSCULAR | Status: AC
  Administered 2013-05-28: 4 mg via INTRAVENOUS
  Filled 2013-05-28: qty 2

## 2013-05-28 MED ORDER — HYDROMORPHONE HCL PF 1 MG/ML IJ SOLN
1.0000 mg | Freq: Once | INTRAMUSCULAR | Status: AC
  Administered 2013-05-28: 1 mg via INTRAVENOUS
  Filled 2013-05-28: qty 1

## 2013-05-28 MED ORDER — SODIUM CHLORIDE 0.9 % IV BOLUS (SEPSIS)
1000.0000 mL | Freq: Once | INTRAVENOUS | Status: AC
  Administered 2013-05-28: 1000 mL via INTRAVENOUS

## 2013-05-28 NOTE — ED Provider Notes (Signed)
CSN: [id number]     Arrival date & time 05/28/13  1937 History  This chart was scribed for Benny Lennert, MD by Clydene Laming, ED Scribe. This patient was seen in room APA03/APA03 and the patient's care was started at 10:14 PM.   Chief Complaint  Patient presents with  . Abdominal Pain  . Back Pain    Patient is a 37 y.o. male presenting with abdominal pain and back pain. The history is provided by the patient. No language interpreter was used.  Abdominal Pain Pain location:  Suprapubic Pain quality: sharp   Pain radiates to:  L flank Pain severity:  Mild Onset quality:  Sudden Duration:  5 days Timing:  Constant Progression:  Worsening Associated symptoms: nausea   Associated symptoms: no constipation, no diarrhea, no dysuria, no hematochezia and no vomiting   Back Pain Associated symptoms: abdominal pain   Associated symptoms: no dysuria    HPI Comments: [name] is a 37 y.o. male who presents to the Emergency Department complaining of sudden abdominal pain that is radiating to the back with associated nausea onset 5 days ago. He states he begin to feel symptoms after eating on Monday. Pt denies abnormal bowel or bladder functions. Pt has a surgical hx of appendectomy when he was 37 y.o (19 years ago).   Past Medical History  Diagnosis Date  . Diabetes mellitus without complication    Past Surgical History  Procedure Laterality Date  . Appendectomy    . Knee surgery Bilateral    History reviewed. No pertinent family history. History  Substance Use Topics  . Smoking status: Current Every Day Smoker  . Smokeless tobacco: Not on file  . Alcohol Use: No    Review of Systems  Gastrointestinal: Positive for nausea and abdominal pain. Negative for vomiting, diarrhea, constipation and hematochezia.  Genitourinary: Negative for dysuria.  Musculoskeletal: Positive for back pain.    Allergies  Review of patient's allergies indicates no known allergies.  Home  Medications   Current Outpatient Rx  Name  Route  Sig  Dispense  Refill  . bismuth subsalicylate (PEPTO BISMOL) 262 MG/15ML suspension   Oral   Take 30 mLs by mouth once as needed for indigestion or diarrhea or loose stools.         . divalproex (DEPAKOTE) 500 MG DR tablet   Oral   Take 1,000 mg by mouth at bedtime.         . insulin aspart (NOVOLOG) 100 UNIT/ML injection   Subcutaneous   Inject 40 Units into the skin 2 (two) times daily.         . Insulin Aspart Prot & Aspart (NOVOLOG MIX 70/30 FLEXPEN) (70-30) 100 UNIT/ML SUPN   Subcutaneous   Inject 70 Units into the skin 2 (two) times daily.         . risperiDONE (RISPERDAL) 1 MG tablet   Oral   Take 1 mg by mouth at bedtime.         . simvastatin (ZOCOR) 20 MG tablet   Oral   Take 20 mg by mouth at bedtime.         . solifenacin (VESICARE) 10 MG tablet   Oral   Take 10 mg by mouth daily.          BP 128/86  Pulse 122  Temp(Src) 98.5 F (36.9 C)  Resp 18  Ht 6\' 1"  (1.854 m)  Wt 225 lb (102.059 kg)  BMI 29.69 kg/m2  SpO2  99% Physical Exam  Constitutional: He is oriented to person, place, and time. He appears well-developed.  HENT:  Head: Normocephalic.  Eyes: Conjunctivae and EOM are normal. No scleral icterus.  Neck: Neck supple. No thyromegaly present.  Cardiovascular: Normal rate and regular rhythm.   Pulmonary/Chest: No stridor. He has no wheezes. He has no rales. He exhibits no tenderness.  Abdominal: He exhibits no distension. There is no tenderness. There is no rebound.  Moderate suprapubic tenderness, left flank tenderness   Musculoskeletal: Normal range of motion. He exhibits no edema.  Lymphadenopathy:    He has no cervical adenopathy.  Neurological: He is alert and oriented to person, place, and time. He exhibits normal muscle tone. Coordination normal.  Skin: No rash noted. No erythema.  Psychiatric: He has a normal mood and affect. His behavior is normal.    ED Course   Procedures (including critical care time) DIAGNOSTIC STUDIES: Oxygen Saturation is 99% on RA, normal by my interpretation.    COORDINATION OF CARE: 10:18 PM- Discussed treatment plan with pt at bedside. Pt verbalized understanding and agreement with plan.   Labs Review Labs Reviewed - No data to display Imaging Review No results found.  EKG Interpretation   None       MDM  Pt improving with tx.  Will tx pancreatitis as out pt   Benny Lennert, MD 05/29/13 [phone]

## 2013-05-28 NOTE — ED Notes (Signed)
A couple of days ago I ate, and then I started getting sick. I am having abdominal pain and pain in my back per pt. Nauseated per pt. Normal bowel and bladder function per pt.

## 2013-05-29 LAB — URINALYSIS, ROUTINE W REFLEX MICROSCOPIC
Leukocytes, UA: NEGATIVE
Protein, ur: NEGATIVE mg/dL
Specific Gravity, Urine: 1.025 (ref 1.005–1.030)
Urobilinogen, UA: 0.2 mg/dL (ref 0.0–1.0)
pH: 6 (ref 5.0–8.0)

## 2013-05-29 LAB — LIPASE, BLOOD: Lipase: 95 U/L — ABNORMAL HIGH (ref 11–59)

## 2013-05-29 LAB — URINE MICROSCOPIC-ADD ON

## 2013-05-29 MED ORDER — OXYCODONE-ACETAMINOPHEN 5-325 MG PO TABS: 1.0000 | ORAL_TABLET | Freq: Four times a day (QID) | ORAL | Status: DC | PRN

## 2013-05-29 MED ORDER — PROMETHAZINE HCL 25 MG PO TABS: 25.0000 mg | ORAL_TABLET | Freq: Four times a day (QID) | ORAL | Status: DC | PRN

## 2013-06-03 ENCOUNTER — Inpatient Hospital Stay (HOSPITAL_COMMUNITY): Payer: Medicaid Other

## 2013-06-03 ENCOUNTER — Inpatient Hospital Stay (HOSPITAL_COMMUNITY)
Admission: EM | Admit: 2013-06-03 | Discharge: 2013-06-06 | DRG: 637 | Disposition: A | Discharge status: Home / Self Care | Payer: Medicaid Other | Attending: Pulmonary Disease | Admitting: Pulmonary Disease

## 2013-06-03 ENCOUNTER — Encounter: Payer: Self-pay | Admitting: Gastroenterology

## 2013-06-03 ENCOUNTER — Telehealth: Payer: Self-pay | Admitting: Gastroenterology

## 2013-06-03 ENCOUNTER — Ambulatory Visit (INDEPENDENT_AMBULATORY_CARE_PROVIDER_SITE_OTHER): Payer: Medicaid Other | Admitting: Gastroenterology

## 2013-06-03 ENCOUNTER — Encounter (HOSPITAL_COMMUNITY): Payer: Self-pay | Admitting: Emergency Medicine

## 2013-06-03 VITALS — BP 135/88 | HR 125 | Temp 98.4°F | Ht 73.0 in | Wt 223.6 lb

## 2013-06-03 DIAGNOSIS — R945 Abnormal results of liver function studies: Secondary | ICD-10-CM | POA: Diagnosis present

## 2013-06-03 DIAGNOSIS — Z23 Encounter for immunization: Secondary | ICD-10-CM

## 2013-06-03 DIAGNOSIS — R7989 Other specified abnormal findings of blood chemistry: Secondary | ICD-10-CM

## 2013-06-03 DIAGNOSIS — E785 Hyperlipidemia, unspecified: Secondary | ICD-10-CM | POA: Diagnosis present

## 2013-06-03 DIAGNOSIS — F3162 Bipolar disorder, current episode mixed, moderate: Secondary | ICD-10-CM | POA: Diagnosis present

## 2013-06-03 DIAGNOSIS — K8020 Calculus of gallbladder without cholecystitis without obstruction: Secondary | ICD-10-CM | POA: Diagnosis present

## 2013-06-03 DIAGNOSIS — F17200 Nicotine dependence, unspecified, uncomplicated: Secondary | ICD-10-CM | POA: Diagnosis present

## 2013-06-03 DIAGNOSIS — Z794 Long term (current) use of insulin: Secondary | ICD-10-CM

## 2013-06-03 DIAGNOSIS — K8010 Calculus of gallbladder with chronic cholecystitis without obstruction: Secondary | ICD-10-CM | POA: Diagnosis present

## 2013-06-03 DIAGNOSIS — E1010 Type 1 diabetes mellitus with ketoacidosis without coma: Principal | ICD-10-CM | POA: Diagnosis present

## 2013-06-03 DIAGNOSIS — E1110 Type 2 diabetes mellitus with ketoacidosis without coma: Secondary | ICD-10-CM | POA: Diagnosis present

## 2013-06-03 DIAGNOSIS — K859 Acute pancreatitis without necrosis or infection, unspecified: Secondary | ICD-10-CM | POA: Diagnosis present

## 2013-06-03 DIAGNOSIS — E1169 Type 2 diabetes mellitus with other specified complication: Secondary | ICD-10-CM | POA: Diagnosis present

## 2013-06-03 HISTORY — DX: Acute pancreatitis without necrosis or infection, unspecified: K85.90

## 2013-06-03 LAB — BASIC METABOLIC PANEL
BUN: 15 mg/dL (ref 6–23)
BUN: 15 mg/dL (ref 6–23)
BUN: 15 mg/dL (ref 6–23)
CALCIUM: 10 mg/dL (ref 8.4–10.5)
CALCIUM: 10.4 mg/dL (ref 8.4–10.5)
CO2: 18 meq/L — AB (ref 19–32)
CO2: 23 mEq/L (ref 19–32)
CO2: 23 mEq/L (ref 19–32)
CREATININE: 0.67 mg/dL (ref 0.50–1.35)
Calcium: 9.6 mg/dL (ref 8.4–10.5)
Chloride: 89 mEq/L — ABNORMAL LOW (ref 96–112)
Chloride: 95 mEq/L — ABNORMAL LOW (ref 96–112)
Chloride: 97 mEq/L (ref 96–112)
Creatinine, Ser: 0.68 mg/dL (ref 0.50–1.35)
Creatinine, Ser: 0.58 mg/dL (ref 0.50–1.35)
GFR calc non Af Amer: >90 mL/min (ref >90)
GLUCOSE: 546 mg/dL — AB (ref 70–99)
Glucose, Bld: 360 mg/dL — ABNORMAL HIGH (ref 70–99)
Glucose, Bld: 293 mg/dL — ABNORMAL HIGH (ref 70–99)
POTASSIUM: 4.3 meq/L (ref 3.7–5.3)
Potassium: 4.8 mEq/L (ref 3.5–5.3)
Potassium: 4.5 mEq/L (ref 3.7–5.3)
SODIUM: 132 meq/L — AB (ref 137–147)
SODIUM: 133 meq/L — AB (ref 137–147)
Sodium: 127 mEq/L — ABNORMAL LOW (ref 135–145)

## 2013-06-03 LAB — CBC WITH DIFFERENTIAL/PLATELET
Basophils Absolute: 0 10*3/uL (ref 0.0–0.1)
Basophils Relative: 0 % (ref 0–1)
EOS ABS: 0.4 10*3/uL (ref 0.0–0.7)
Eosinophils Relative: 3 % (ref 0–5)
HCT: 41.2 % (ref 39.0–52.0)
Hemoglobin: 14.6 g/dL (ref 13.0–17.0)
LYMPHS ABS: 2.6 10*3/uL (ref 0.7–4.0)
Lymphocytes Relative: 20 % (ref 12–46)
MCH: 32.7 pg (ref 26.0–34.0)
MCHC: 35.4 g/dL (ref 30.0–36.0)
MCV: 92.4 fL (ref 78.0–100.0)
Monocytes Absolute: 0.8 10*3/uL (ref 0.1–1.0)
Monocytes Relative: 6 % (ref 3–12)
NEUTROS ABS: 8.7 10*3/uL — AB (ref 1.7–7.7)
NEUTROS PCT: 69 % (ref 43–77)
Platelets: 278 10*3/uL (ref 150–400)
RBC: 4.46 MIL/uL (ref 4.22–5.81)
RDW: 12.9 % (ref 11.5–15.5)
WBC: 12.7 10*3/uL — ABNORMAL HIGH (ref 4.0–10.5)

## 2013-06-03 LAB — HEPATIC FUNCTION PANEL
ALK PHOS: 147 U/L — AB (ref 39–117)
ALT: 12 U/L (ref 0–53)
AST: 10 U/L (ref 0–37)
Albumin: 3.5 g/dL (ref 3.5–5.2)
BILIRUBIN TOTAL: 0.3 mg/dL (ref 0.3–1.2)
Bilirubin, Direct: <0.1 mg/dL (ref 0.0–0.3)
Total Protein: 9.1 g/dL — ABNORMAL HIGH (ref 6.0–8.3)

## 2013-06-03 LAB — CBC
HCT: 42.1 % (ref 39.0–52.0)
HEMOGLOBIN: 14.9 g/dL (ref 13.0–17.0)
MCH: 32.3 pg (ref 26.0–34.0)
MCHC: 35.4 g/dL (ref 30.0–36.0)
MCV: 91.3 fL (ref 78.0–100.0)
Platelets: 260 10*3/uL (ref 150–400)
RBC: 4.61 MIL/uL (ref 4.22–5.81)
RDW: 12.8 % (ref 11.5–15.5)
WBC: 13.2 10*3/uL — AB (ref 4.0–10.5)

## 2013-06-03 LAB — GLUCOSE, CAPILLARY
GLUCOSE-CAPILLARY: 287 mg/dL — AB (ref 70–99)
Glucose-Capillary: 355 mg/dL — ABNORMAL HIGH (ref 70–99)

## 2013-06-03 LAB — MRSA PCR SCREENING: MRSA by PCR: NEGATIVE

## 2013-06-03 LAB — LIPASE: LIPASE: 192 U/L — AB (ref 11–59)

## 2013-06-03 MED ORDER — SIMVASTATIN 20 MG PO TABS
20.0000 mg | ORAL_TABLET | Freq: Every day | ORAL | Status: DC
  Administered 2013-06-03 – 2013-06-05 (×3): 20 mg via ORAL
  Filled 2013-06-03 (×3): qty 1

## 2013-06-03 MED ORDER — LINACLOTIDE 145 MCG PO CAPS
145.0000 ug | ORAL_CAPSULE | Freq: Every day | ORAL | Status: DC
Start: 2013-06-04 — End: 2013-06-06
  Administered 2013-06-04 – 2013-06-06 (×3): 145 ug via ORAL
  Filled 2013-06-03 (×5): qty 1

## 2013-06-03 MED ORDER — RISPERIDONE 1 MG PO TABS
1.0000 mg | ORAL_TABLET | Freq: Every day | ORAL | Status: DC
  Administered 2013-06-03 – 2013-06-05 (×3): 1 mg via ORAL
  Filled 2013-06-03 (×4): qty 1

## 2013-06-03 MED ORDER — HYDROMORPHONE HCL PF 1 MG/ML IJ SOLN
0.5000 mg | INTRAMUSCULAR | Status: DC | PRN
  Administered 2013-06-03 – 2013-06-05 (×5): 0.5 mg via INTRAVENOUS
  Filled 2013-06-03 (×5): qty 1

## 2013-06-03 MED ORDER — SODIUM CHLORIDE 0.9 % IV SOLN
INTRAVENOUS | Status: DC
  Administered 2013-06-03: 3 [IU]/h via INTRAVENOUS
  Filled 2013-06-03: qty 1

## 2013-06-03 MED ORDER — ONDANSETRON HCL 4 MG PO TABS: 4.0000 mg | ORAL_TABLET | Freq: Four times a day (QID) | ORAL | Status: DC | PRN

## 2013-06-03 MED ORDER — LINACLOTIDE 145 MCG PO CAPS: 145.0000 ug | ORAL_CAPSULE | Freq: Every day | ORAL | Status: DC

## 2013-06-03 MED ORDER — ACETAMINOPHEN 325 MG PO TABS: 650.0000 mg | ORAL_TABLET | Freq: Four times a day (QID) | ORAL | Status: DC | PRN

## 2013-06-03 MED ORDER — ONDANSETRON HCL 4 MG/2ML IJ SOLN: 4.0000 mg | Freq: Four times a day (QID) | INTRAMUSCULAR | Status: DC | PRN

## 2013-06-03 MED ORDER — DEXTROSE-NACL 5-0.45 % IV SOLN
INTRAVENOUS | Status: DC
  Administered 2013-06-04 (×2): via INTRAVENOUS

## 2013-06-03 MED ORDER — ZOLPIDEM TARTRATE 5 MG PO TABS
5.0000 mg | ORAL_TABLET | Freq: Every evening | ORAL | Status: DC | PRN
  Administered 2013-06-04: 5 mg via ORAL
  Filled 2013-06-03: qty 1

## 2013-06-03 MED ORDER — SODIUM CHLORIDE 0.9 % IV SOLN
INTRAVENOUS | Status: DC
Start: 2013-06-03 — End: 2013-06-04
  Administered 2013-06-03: 22:00:00 via INTRAVENOUS

## 2013-06-03 MED ORDER — SODIUM CHLORIDE 0.9 % IJ SOLN
3.0000 mL | Freq: Two times a day (BID) | INTRAMUSCULAR | Status: DC
  Administered 2013-06-03 – 2013-06-06 (×5): 3 mL via INTRAVENOUS

## 2013-06-03 MED ORDER — ENOXAPARIN SODIUM 40 MG/0.4ML ~~LOC~~ SOLN
40.0000 mg | SUBCUTANEOUS | Status: DC
  Administered 2013-06-03 – 2013-06-05 (×3): 40 mg via SUBCUTANEOUS
  Filled 2013-06-03 (×3): qty 0.4

## 2013-06-03 MED ORDER — DARIFENACIN HYDROBROMIDE ER 7.5 MG PO TB24
7.5000 mg | ORAL_TABLET | Freq: Every day | ORAL | Status: DC
  Administered 2013-06-04 – 2013-06-06 (×3): 7.5 mg via ORAL
  Filled 2013-06-03 (×5): qty 1

## 2013-06-03 MED ORDER — ALUM & MAG HYDROXIDE-SIMETH 200-200-20 MG/5ML PO SUSP: 30.0000 mL | Freq: Four times a day (QID) | ORAL | Status: DC | PRN

## 2013-06-03 MED ORDER — DEXTROSE 50 % IV SOLN
25.0000 mL | INTRAVENOUS | Status: DC | PRN
Start: 2013-06-03 — End: 2013-06-06

## 2013-06-03 MED ORDER — DIVALPROEX SODIUM 250 MG PO DR TAB
1000.0000 mg | DELAYED_RELEASE_TABLET | Freq: Every day | ORAL | Status: DC
  Administered 2013-06-03 – 2013-06-05 (×3): 1000 mg via ORAL
  Filled 2013-06-03 (×3): qty 4

## 2013-06-03 MED ORDER — POTASSIUM CHLORIDE 10 MEQ/100ML IV SOLN
10.0000 meq | INTRAVENOUS | Status: AC
  Filled 2013-06-03: qty 100

## 2013-06-03 MED ORDER — PROMETHAZINE HCL 12.5 MG PO TABS: 25.0000 mg | ORAL_TABLET | Freq: Four times a day (QID) | ORAL | Status: DC | PRN

## 2013-06-03 MED ORDER — SODIUM CHLORIDE 0.9 % IV SOLN
INTRAVENOUS | Status: AC
  Administered 2013-06-03: 20:00:00 via INTRAVENOUS

## 2013-06-03 MED ORDER — HYDROCODONE-ACETAMINOPHEN 5-325 MG PO TABS
1.0000 | ORAL_TABLET | ORAL | Status: DC | PRN
  Administered 2013-06-03: 2 via ORAL
  Administered 2013-06-04 (×3): 1 via ORAL
  Administered 2013-06-05 – 2013-06-06 (×5): 2 via ORAL
  Filled 2013-06-03: qty 1
  Filled 2013-06-03 (×6): qty 2
  Filled 2013-06-03: qty 1
  Filled 2013-06-03: qty 2
  Filled 2013-06-03: qty 1

## 2013-06-03 NOTE — Patient Instructions (Addendum)
1. Please go to the lab at [facility] to have STAT labs drawn. 2. If your abdominal pain worsens, you develop fever or vomiting, go to the ER for admission. 3. For constipation, take Linzess 126mcg one pill daily. Samples provided.  4. For the pancreatitis, you need to stick to a strict clear liquid only diet until your abdominal pain improves. Then you may add back low-fat diet as tolerated. You will likely need to be on liquids only for 2-3 days.   Clear Liquid Diet The clear liquid diet consists of foods that are liquid or will become liquid at room temperature. Examples of foods allowed on a clear liquid diet include fruit juice, broth or bouillon, gelatin, or frozen ice pops. You should be able to see through the liquid. The purpose of this diet is to provide the necessary fluids, electrolytes (such as sodium and potassium), and energy to keep the body functioning during times when you are not able to consume a regular diet. A clear liquid diet should not be continued for long periods of time, as it is not nutritionally adequate.  A CLEAR LIQUID DIET MAY BE NEEDED:  When a sudden-onset (acute) condition occurs before or after surgery.   As the first step in oral feeding.   For fluid and electrolyte replacement in diarrheal diseases.   As a diet before certain medical tests are performed.  ADEQUACY The clear liquid diet is adequate only in ascorbic acid, according to the Recommended Dietary Allowances of the [employer].  CHOOSING FOODS Breads and Starches  Allowed: None are allowed.   Avoid: All are to be avoided.  Vegetables  Allowed: Strained vegetable juices.   Avoid: Any others.  Fruit  Allowed: Strained fruit juices and fruit drinks. Include 1 serving of citrus or vitamin C-enriched fruit juice daily.   Avoid: Any others.  Meat and Meat Substitutes  Allowed: None are allowed.   Avoid: All are to be avoided.  Milk  Products  Allowed: None are allowed.   Avoid: All are to be avoided.  Soups and Combination Foods  Allowed: Clear bouillon, broth, or strained broth-based soups.   Avoid: Any others.  Desserts and Sweets  Allowed: Sugar, honey. High-protein gelatin. Flavored gelatin, ices, or frozen ice pops that do not contain milk.   Avoid: Any others.  Fats and Oils  Allowed: None are allowed.   Avoid: All are to be avoided.  Beverages  Allowed: Cereal beverages, coffee (regular or decaffeinated), tea, or soda at the discretion of your health care provider.   Avoid: Any others.  Condiments  Allowed: Salt.   Avoid: Any others, including pepper.  Supplements  Allowed: Liquid nutrition beverages that you can see through.   Avoid: Any others that contain lactose or fiber. SAMPLE MEAL PLAN Breakfast  4 oz (120 mL) strained orange juice.   to 1 cup (120 to 240 mL) gelatin (plain or fortified).  1 cup (240 mL) beverage (coffee or tea).  Sugar, if desired. Midmorning Snack   cup (120 mL) gelatin (plain or fortified). Lunch  1 cup (240 mL) broth or consomm.  4 oz (120 mL) strained grapefruit juice.   cup (120 mL) gelatin (plain or fortified).  1 cup (240 mL) beverage (coffee or tea).  Sugar, if desired. Midafternoon Snack   cup (120 mL) fruit ice.   cup (120 mL) strained fruit juice. Dinner  1 cup (240 mL) broth or consomm.   cup (120 mL) cranberry juice.  cup (120 mL) flavored gelatin (plain or fortified).  1 cup (240 mL) beverage (coffee or tea).  Sugar, if desired. Evening Snack  4 oz (120 mL) strained apple juice (vitamin C-fortified).   cup (120 mL) flavored gelatin (plain or fortified). MAKE SURE YOU:  Understand these instructions.  Will watch your child's condition.  Will get help right away if your child is not doing well or gets worse. Document Released: 05/19/2005 Document Revised: 01/19/2013  Document Reviewed: 10/19/2012 [facility] Patient Information 2014 [place].  Acute Pancreatitis Acute pancreatitis is a disease in which the pancreas becomes suddenly irritated (inflamed). The pancreas is a large gland behind your stomach. The pancreas makes enzymes that help digest food. The pancreas also makes 2 hormones that help control your blood sugar. Acute pancreatitis happens when the enzymes attack and damage the pancreas. Most attacks last a couple of days and can cause serious problems. HOME CARE  Follow your doctor's diet instructions. You may need to avoid alcohol and limit fat in your diet.  Eat small meals often.  Drink enough fluids to keep your pee (urine) clear or pale yellow.  Only take medicines as told by your doctor.  Avoid drinking alcohol if it caused your disease.  Do not smoke.  Get plenty of rest.  Check your blood sugar at home as told by your doctor.  Keep all doctor visits as told. GET HELP RIGHT AWAY IF:   You are unable to eat or keep fluids down.  Your pain becomes severe.  You have a fever or lasting symptoms for more than 2 to 3 days.  You have a fever and your symptoms suddenly get worse.  Your skin or the white part of your eyes turn yellow (jaundice).  You throw up (vomit).  You feel dizzy, or you pass out (faint).  Your blood sugar is high (over 300 mg/dL).  You do not get better as quickly as expected.  You have new or worsening symptoms.  You have lasting pain, weakness, or feel sick to your stomach (nauseous).  You get better and then have another pain attack. MAKE SURE YOU:   Understand these instructions.  Will watch your condition.  Will get help right away if you are not doing well or get worse. Document Released: 11/05/2007 Document Revised: 11/18/2011 Document Reviewed: 08/28/2011 [facility] Patient Information 2014 [place].

## 2013-06-03 NOTE — Progress Notes (Signed)
REVIEWED.  

## 2013-06-03 NOTE — Assessment & Plan Note (Signed)
38 y/o male with acute pancreatitis (?biliary) diagnosed on 05/28/13. He denies etoh use. He has cholelithiasis noted on CT. Continues to have abdominal pain. No vomiting. Never followed clear liquid diet. Had chicken strip/french fries yesterday. Pain medication helps pain. Urine is clear in color. Discussed options of stat labs and outpatient management versus hospitalization. Patient did not want hospitalization.   STAT labs. Clear liquids for 2-3 days.  Go to ER if symptoms worse, fever, increased abdominal pain, vomiting.  OV in one week. Can address chronic rectal bleeding at that time as well as etiology of pancreatitis. Suspect next step would be getting gallbladder removed.

## 2013-06-03 NOTE — ED Notes (Signed)
Pt took 40units of novolog insulin today around 1pm and 70units of 70/30 insulin around1pm.

## 2013-06-03 NOTE — Progress Notes (Signed)
Primary Care Physician:  Alonza Bogus, MD  Primary Gastroenterologist:  Garfield Cornea, MD (per mother's request)    Chief Complaint  Patient presents with  . Abdominal Pain  . Pancreatitis    also has gallstones    HPI:  [name] is a 38 y.o. male here as a new patient appointment for further evaluation of pancreatitis, gallstones. His mother is a patient of Dr. Gala Romney.  Symptoms started about 7 days ago. After a sandwich, he developed acute onset left upper quadrant pain with radiation into his back. Denies any vomiting. Appetite has been poor. He was evaluated on December 27 in emergency department. He was noted to have mild acute pancreatitis, cholelithiasis at that time. His white blood cell count was 13,500, sodium 129, chloride 90, glucose 349, alkaline phosphatase 147. Lipase was 95 at that time. It was felt that he to be managed as an outpatient with pain medication.  First epidsode of pancreatitis. Patient states that he's been drinking plenty of liquids however he has also been eating solid foods. He never consumed just a clear liquid diet. He saw Dr. Luan Pulling on Monday and had repeat blood work as listed below. Continues to have significant pain luq and into back.  Last 24 hours, one chicken strip and three fries. Pain worse with meals. Chronic brbpr, small volume. BM regular before got sick. Last BM Saturday. No heartburn. Right now pain at about 7. Pain medications puts to sleep.  Current Outpatient Prescriptions  Medication Sig Dispense Refill  . divalproex (DEPAKOTE) 500 MG DR tablet Take 1,000 mg by mouth at bedtime.      . insulin aspart (NOVOLOG) 100 UNIT/ML injection Inject 40 Units into the skin 2 (two) times daily.      . Insulin Aspart Prot & Aspart (NOVOLOG MIX 70/30 FLEXPEN) (70-30) 100 UNIT/ML SUPN Inject 70 Units into the skin 2 (two) times daily.      . promethazine (PHENERGAN) 25 MG tablet Take 1 tablet (25 mg total) by mouth every 6 (six) hours as  needed for nausea or vomiting.  15 tablet  0  . risperiDONE (RISPERDAL) 1 MG tablet Take 1 mg by mouth at bedtime.      . simvastatin (ZOCOR) 20 MG tablet Take 20 mg by mouth at bedtime.      . solifenacin (VESICARE) 10 MG tablet Take 10 mg by mouth daily.       No current facility-administered medications for this visit.    Allergies as of 06/03/2013  . (No Known Allergies)    Past Medical History  Diagnosis Date  . Diabetes mellitus without complication   . Bipolar disorder   . Hyperlipidemia     Past Surgical History  Procedure Laterality Date  . Appendectomy    . Knee surgery Bilateral     Family History  Problem Relation Age of Onset  . Pancreatitis Neg Hx   . Colon cancer Neg Hx     History   Social History  . Marital Status: Single    Spouse Name: N/A    Number of Children: 0  . Years of Education: N/A   Occupational History  . Not on file.   Social History Main Topics  . Smoking status: Current Every Day Smoker  . Smokeless tobacco: Not on file  . Alcohol Use: No  . Drug Use: No  . Sexual Activity: Not on file   Other Topics Concern  . Not on file   Social History Narrative  .  No narrative on file      ROS:  General: Negative for anorexia, weight loss, fever, chills, fatigue, weakness. Eyes: Negative for vision changes.  ENT: Negative for hoarseness, difficulty swallowing , nasal congestion. CV: Negative for chest pain, angina, palpitations, dyspnea on exertion, peripheral edema.  Respiratory: Negative for dyspnea at rest, dyspnea on exertion, cough, sputum, wheezing.  GI: See history of present illness. GU:  Negative for dysuria, hematuria, urinary incontinence, urinary frequency, nocturnal urination.  MS: Negative for joint pain, low back pain.  Derm: Negative for rash or itching.  Neuro: Negative for weakness, abnormal sensation, seizure, frequent headaches, memory loss, confusion.  Psych: Negative for anxiety, depression, suicidal  ideation, hallucinations.  Endo: Negative for unusual weight change.  Heme: Negative for bruising or bleeding. Allergy: Negative for rash or hives.    Physical Examination:  BP 135/88  Pulse 125  Temp(Src) 98.4 F (36.9 C) (Oral)  Ht 6\' 1"  (1.854 m)  Wt 223 lb 9.6 oz (101.424 kg)  BMI 29.51 kg/m2   General: Well-nourished, well-developed in no acute distress. Accompanied by mother. Head: Normocephalic, atraumatic.   Eyes: Conjunctiva pink, no icterus. Mouth: Oropharyngeal mucosa moist and pink , no lesions erythema or exudate. Neck: Supple without thyromegaly, masses, or lymphadenopathy.  Lungs: Clear to auscultation bilaterally.  Heart: Regular rhythm, tachycardia,  no murmurs rubs or gallops.  Abdomen: Bowel sounds are normal, moderate LUQ tenderness, nondistended, no hepatosplenomegaly or masses, no abdominal bruits or    hernia , no rebound or guarding.   Rectal: not performed Extremities: No lower extremity edema. No clubbing or deformities.  Neuro: Alert and oriented x 4 , grossly normal neurologically.  Skin: Warm and dry, no rash or jaundice.   Psych: Alert and cooperative, normal mood and affect.  Labs: Lab Results  Component Value Date   CREATININE 0.68 05/28/2013   BUN 16 05/28/2013   NA 129* 05/28/2013   K 4.4 05/28/2013   CL 90* 05/28/2013   CO2 24 05/28/2013   Lab Results  Component Value Date   ALT 15 05/28/2013   AST 11 05/28/2013   ALKPHOS 147* 05/28/2013   BILITOT 0.3 05/28/2013   Lab Results  Component Value Date   CALCIUM 10.2 05/28/2013    Lab Results  Component Value Date   WBC 13.5* 05/28/2013   HGB 15.7 05/28/2013   HCT 43.9 05/28/2013   MCV 91.3 05/28/2013   PLT 219 05/28/2013   Lab Results  Component Value Date   LIPASE 95* 05/28/2013   Labs from 05/30/2013 show white blood cell count 12,600, hemoglobin 14.4, platelets 251,000, lipase 115  Imaging Studies: Ct Abdomen Pelvis Wo Contrast  05/29/2013   CLINICAL DATA:   Left-sided flank pain.  EXAM: CT ABDOMEN AND PELVIS WITHOUT CONTRAST  TECHNIQUE: Multidetector CT imaging of the abdomen and pelvis was performed following the standard protocol without intravenous contrast.  COMPARISON:  CT of the abdomen and pelvis from 11/30/2008  FINDINGS: Minimal bibasilar atelectasis is noted.  Mild soft tissue stranding is noted about the body and tail of the pancreas, raising concern for mild acute pancreatitis. The head of the pancreas is unremarkable in appearance. There is no evidence of pseudocyst formation. Evaluation for devascularization is limited without contrast.  There is diffuse fatty infiltration within the liver. The liver is otherwise unremarkable in appearance. The spleen is within normal limits. A small stone is noted within the gallbladder; the gallbladder is otherwise unremarkable. The adrenal glands are unremarkable.  The kidneys are unremarkable  in appearance. There is no evidence of hydronephrosis. No renal or ureteral stones are seen. No perinephric stranding is appreciated.  No free fluid is identified. The small bowel is unremarkable in appearance. The stomach is within normal limits. No acute vascular abnormalities are seen. Scattered calcification is noted along the abdominal aorta and its branches, advanced for age.  The patient is status post appendectomy. Contrast progresses to the level of the transverse colon. The colon is unremarkable in appearance.  The bladder is mildly distended and grossly unremarkable. The prostate remains normal in size. No inguinal lymphadenopathy is seen.  No acute osseous abnormalities are identified.  IMPRESSION: 1. Mild acute pancreatitis, with mild soft tissue inflammation about the body and tail of the pancreas. No evidence of pseudocyst formation. Evaluation for devascularization is limited without contrast. 2. Scattered calcification along the abdominal aorta and its branches, advanced for age. 3. Diffuse fatty infiltration  within the liver. 4. Cholelithiasis; gallbladder otherwise unremarkable in appearance.   Electronically Signed   By: Garald Balding M.D.   On: 05/29/2013 00:07

## 2013-06-03 NOTE — ED Notes (Signed)
cbg in triage was Dyess r.n. Aware. Franck Vinal

## 2013-06-03 NOTE — ED Notes (Signed)
Pt reports was told by Neil Crouch PA to bring pt to ER for evaluation of elevated blood sugar.  Family says was told pt was to be admitted to Dr. Luan Pulling.  Reports had blood work this am and was told glucose was over 500.  Pt has history of pancreatitis.

## 2013-06-03 NOTE — H&P (Signed)
[name] MRN: [id number] DOB/AGE: [birth year] 38 y.o. Primary Care Physician:Damonta Cossey L, MD Admit date: 06/03/2013 Chief Complaint: Abdominal Pain HPI: This is a 38 year old who was diagnosed with pancreatitis approximately a week ago. He initially did pretty well and had been seen in my office twice and seemed to be getting better. However today I had him seen by the gastroenterologist a.m. it was recommended that he be admitted. He did not want to do that he was told that if he continued having abdominal pain particularly if hoarse he should come to the emergency department. He did have more problems with abdominal pain he was also found to have uncontrolled diabetes and diabetic ketoacidosis. His lipase level has gone up. His pain is worse. He has not been vomiting he has been nauseated. This CT of the abdomen when the diagnosis was made also showed he had stones in his gallbladder but did not appear to have choledocholithiasis. He also did not seem to have cholecystitis. Is admitted for control of his blood sugar in treatment of his abdominal pain.  Past Medical History  Diagnosis Date  . Diabetes mellitus without complication   . Bipolar disorder   . Hyperlipidemia   . Pancreatitis    Past Surgical History  Procedure Laterality Date  . Appendectomy    . Knee surgery Bilateral         Family History  Problem Relation Age of Onset  . Pancreatitis Neg Hx   . Colon cancer Neg Hx     Social History:  reports that he has been smoking.  He does not have any smokeless tobacco history on file. He reports that he does not drink alcohol or use illicit drugs.   Allergies: No Known Allergies  Medications Prior to Admission  Medication Sig Dispense Refill  . divalproex (DEPAKOTE) 500 MG DR tablet Take 1,000 mg by mouth at bedtime.      Marland Kitchen Hydrocodone-Acetaminophen (VICODIN PO) Take 1 tablet by mouth every 6 (six) hours.      . insulin aspart (NOVOLOG) 100 UNIT/ML injection  Inject 40 Units into the skin 2 (two) times daily.      . Insulin Aspart Prot & Aspart (NOVOLOG MIX 70/30 FLEXPEN) (70-30) 100 UNIT/ML SUPN Inject 70 Units into the skin 2 (two) times daily.      . Linaclotide (LINZESS) 145 MCG CAPS capsule Take 1 capsule (145 mcg total) by mouth daily.  5 capsule  0  . promethazine (PHENERGAN) 25 MG tablet Take 1 tablet (25 mg total) by mouth every 6 (six) hours as needed for nausea or vomiting.  15 tablet  0  . risperiDONE (RISPERDAL) 1 MG tablet Take 1 mg by mouth at bedtime.      . simvastatin (ZOCOR) 20 MG tablet Take 20 mg by mouth at bedtime.      . solifenacin (VESICARE) 10 MG tablet Take 10 mg by mouth daily.           NTI:RWERX from the symptoms mentioned above,there are no other symptoms referable to all systems reviewed.  Physical Exam: Blood pressure 130/93, pulse 103, temperature 97.9 F (36.6 C), temperature source Axillary, resp. rate 18, height 6\' 1"  (1.854 m), weight 99.6 kg (219 lb 9.3 oz), SpO2 97.00%. He is awake and alert. He does not appear to be in acute distress. His  pupils are reactive. His mucous membranes are dry. His neck is supple. His chest is clear. His heart is regular without gallop. His abdomen is mildly tender  diffusely more so in the epigastric region. Extremities showed no edema. Central nervous system examination is grossly intact.     Recent Labs  06/03/13 1040  WBC 12.7*  NEUTROABS 8.7*  HGB 14.6  HCT 41.2  MCV 92.4  PLT 278    Recent Labs  06/03/13 1040  NA 127*  K 4.8  CL 89*  CO2 18*  GLUCOSE 546*  BUN 15  CREATININE 0.67  CALCIUM 10.0  lablast2(ast:2,ALT:2,alkphos:2,bilitot:2,prot:2,albumin:2)@    No results found for this or any previous visit (from the past 240 hour(s)).   Ct Abdomen Pelvis Wo Contrast  05/29/2013   CLINICAL DATA:  Left-sided flank pain.  EXAM: CT ABDOMEN AND PELVIS WITHOUT CONTRAST  TECHNIQUE: Multidetector CT imaging of the abdomen and pelvis was performed following  the standard protocol without intravenous contrast.  COMPARISON:  CT of the abdomen and pelvis from 11/30/2008  FINDINGS: Minimal bibasilar atelectasis is noted.  Mild soft tissue stranding is noted about the body and tail of the pancreas, raising concern for mild acute pancreatitis. The head of the pancreas is unremarkable in appearance. There is no evidence of pseudocyst formation. Evaluation for devascularization is limited without contrast.  There is diffuse fatty infiltration within the liver. The liver is otherwise unremarkable in appearance. The spleen is within normal limits. A small stone is noted within the gallbladder; the gallbladder is otherwise unremarkable. The adrenal glands are unremarkable.  The kidneys are unremarkable in appearance. There is no evidence of hydronephrosis. No renal or ureteral stones are seen. No perinephric stranding is appreciated.  No free fluid is identified. The small bowel is unremarkable in appearance. The stomach is within normal limits. No acute vascular abnormalities are seen. Scattered calcification is noted along the abdominal aorta and its branches, advanced for age.  The patient is status post appendectomy. Contrast progresses to the level of the transverse colon. The colon is unremarkable in appearance.  The bladder is mildly distended and grossly unremarkable. The prostate remains normal in size. No inguinal lymphadenopathy is seen.  No acute osseous abnormalities are identified.  IMPRESSION: 1. Mild acute pancreatitis, with mild soft tissue inflammation about the body and tail of the pancreas. No evidence of pseudocyst formation. Evaluation for devascularization is limited without contrast. 2. Scattered calcification along the abdominal aorta and its branches, advanced for age. 3. Diffuse fatty infiltration within the liver. 4. Cholelithiasis; gallbladder otherwise unremarkable in appearance.   Electronically Signed   By: Garald Balding M.D.   On: 05/29/2013 00:07    Impression: He has what appears to be diabetic ketoacidosis. He has pancreatitis. He has cholelithiasis but I don't think he has cholecystitis. He has baseline diabetes which has been poorly controlled. He has some mental illness which I believe is bipolar disease Active Problems:   DKA (diabetic ketoacidosis)   DKA (diabetic ketoacidoses)     Plan: He will be started on insulin continuous infusion. He will have pain medications ordered. To have an ultrasound of the gallbladder area tomorrow. He will need to have a cholecystectomy at some point.      Odeal Welden L Pager [phone]  06/03/2013, 7:41 PM

## 2013-06-03 NOTE — Telephone Encounter (Signed)
Spoke with mother, please schedule patient with me for 06/09/13 at 2:30.

## 2013-06-03 NOTE — Progress Notes (Signed)
REVIEWED.  PT CONTACTED PCP FOR DIRECT ADMISSION JAN 2. AG 24.

## 2013-06-04 ENCOUNTER — Inpatient Hospital Stay (HOSPITAL_COMMUNITY): Payer: Medicaid Other

## 2013-06-04 DIAGNOSIS — F3162 Bipolar disorder, current episode mixed, moderate: Secondary | ICD-10-CM | POA: Diagnosis present

## 2013-06-04 LAB — CBC WITH DIFFERENTIAL/PLATELET
Basophils Absolute: 0.1 10*3/uL (ref 0.0–0.1)
Basophils Relative: 0 % (ref 0–1)
EOS PCT: 6 % — AB (ref 0–5)
Eosinophils Absolute: 0.7 10*3/uL (ref 0.0–0.7)
HCT: 38.4 % — ABNORMAL LOW (ref 39.0–52.0)
Hemoglobin: 13.5 g/dL (ref 13.0–17.0)
LYMPHS ABS: 3.3 10*3/uL (ref 0.7–4.0)
LYMPHS PCT: 27 % (ref 12–46)
MCH: 31.9 pg (ref 26.0–34.0)
MCHC: 35.2 g/dL (ref 30.0–36.0)
MCV: 90.8 fL (ref 78.0–100.0)
Monocytes Absolute: 1.1 10*3/uL — ABNORMAL HIGH (ref 0.1–1.0)
Monocytes Relative: 9 % (ref 3–12)
NEUTROS ABS: 7.3 10*3/uL (ref 1.7–7.7)
Neutrophils Relative %: 59 % (ref 43–77)
PLATELETS: 281 10*3/uL (ref 150–400)
RBC: 4.23 MIL/uL (ref 4.22–5.81)
RDW: 12.8 % (ref 11.5–15.5)
WBC: 12.4 10*3/uL — AB (ref 4.0–10.5)

## 2013-06-04 LAB — GLUCOSE, CAPILLARY
GLUCOSE-CAPILLARY: 138 mg/dL — AB (ref 70–99)
GLUCOSE-CAPILLARY: 170 mg/dL — AB (ref 70–99)
GLUCOSE-CAPILLARY: 179 mg/dL — AB (ref 70–99)
GLUCOSE-CAPILLARY: 166 mg/dL — AB (ref 70–99)
GLUCOSE-CAPILLARY: 231 mg/dL — AB (ref 70–99)
Glucose-Capillary: 184 mg/dL — ABNORMAL HIGH (ref 70–99)
Glucose-Capillary: 170 mg/dL — ABNORMAL HIGH (ref 70–99)
Glucose-Capillary: 154 mg/dL — ABNORMAL HIGH (ref 70–99)
Glucose-Capillary: 152 mg/dL — ABNORMAL HIGH (ref 70–99)
Glucose-Capillary: 153 mg/dL — ABNORMAL HIGH (ref 70–99)
Glucose-Capillary: 228 mg/dL — ABNORMAL HIGH (ref 70–99)
Glucose-Capillary: 233 mg/dL — ABNORMAL HIGH (ref 70–99)
Glucose-Capillary: 171 mg/dL — ABNORMAL HIGH (ref 70–99)
Glucose-Capillary: 170 mg/dL — ABNORMAL HIGH (ref 70–99)
Glucose-Capillary: 270 mg/dL — ABNORMAL HIGH (ref 70–99)
Glucose-Capillary: 251 mg/dL — ABNORMAL HIGH (ref 70–99)
Glucose-Capillary: 256 mg/dL — ABNORMAL HIGH (ref 70–99)

## 2013-06-04 LAB — BASIC METABOLIC PANEL
BUN: 15 mg/dL (ref 6–23)
BUN: 15 mg/dL (ref 6–23)
BUN: 15 mg/dL (ref 6–23)
CALCIUM: 9.5 mg/dL (ref 8.4–10.5)
CHLORIDE: 100 meq/L (ref 96–112)
CHLORIDE: 99 meq/L (ref 96–112)
CO2: 24 mEq/L (ref 19–32)
CO2: 24 mEq/L (ref 19–32)
CO2: 25 meq/L (ref 19–32)
Calcium: 9.5 mg/dL (ref 8.4–10.5)
Calcium: 9.4 mg/dL (ref 8.4–10.5)
Chloride: 99 mEq/L (ref 96–112)
Creatinine, Ser: 0.54 mg/dL (ref 0.50–1.35)
Creatinine, Ser: 0.57 mg/dL (ref 0.50–1.35)
Creatinine, Ser: 0.51 mg/dL (ref 0.50–1.35)
GFR calc Af Amer: >90 mL/min (ref >90)
GFR calc Af Amer: >90 mL/min (ref >90)
GFR calc non Af Amer: >90 mL/min (ref >90)
GFR calc non Af Amer: >90 mL/min (ref >90)
GFR calc non Af Amer: >90 mL/min (ref >90)
GLUCOSE: 159 mg/dL — AB (ref 70–99)
Glucose, Bld: 224 mg/dL — ABNORMAL HIGH (ref 70–99)
Glucose, Bld: 165 mg/dL — ABNORMAL HIGH (ref 70–99)
POTASSIUM: 3.7 meq/L (ref 3.7–5.3)
Potassium: 3.4 mEq/L — ABNORMAL LOW (ref 3.7–5.3)
Potassium: 3.5 mEq/L — ABNORMAL LOW (ref 3.7–5.3)
SODIUM: 134 meq/L — AB (ref 137–147)
Sodium: 134 mEq/L — ABNORMAL LOW (ref 137–147)
Sodium: 134 mEq/L — ABNORMAL LOW (ref 137–147)

## 2013-06-04 LAB — LIPASE, BLOOD: Lipase: 211 U/L — ABNORMAL HIGH (ref 11–59)

## 2013-06-04 MED ORDER — INSULIN ASPART 100 UNIT/ML ~~LOC~~ SOLN
6.0000 [IU] | Freq: Three times a day (TID) | SUBCUTANEOUS | Status: DC
  Administered 2013-06-04 – 2013-06-05 (×3): 6 [IU] via SUBCUTANEOUS

## 2013-06-04 MED ORDER — INSULIN ASPART 100 UNIT/ML ~~LOC~~ SOLN
0.0000 [IU] | Freq: Three times a day (TID) | SUBCUTANEOUS | Status: DC
  Administered 2013-06-04: 7 [IU] via SUBCUTANEOUS
  Administered 2013-06-05 (×2): 15 [IU] via SUBCUTANEOUS
  Administered 2013-06-05: 11 [IU] via SUBCUTANEOUS
  Administered 2013-06-06: 7 [IU] via SUBCUTANEOUS

## 2013-06-04 MED ORDER — INSULIN DETEMIR 100 UNIT/ML ~~LOC~~ SOLN
30.0000 [IU] | Freq: Every day | SUBCUTANEOUS | Status: DC
  Administered 2013-06-04: 30 [IU] via SUBCUTANEOUS
  Filled 2013-06-04 (×2): qty 0.3

## 2013-06-04 MED ORDER — INSULIN DETEMIR 100 UNIT/ML ~~LOC~~ SOLN
30.0000 [IU] | Freq: Every day | SUBCUTANEOUS | Status: DC
  Filled 2013-06-04 (×2): qty 0.3

## 2013-06-04 MED ORDER — INSULIN ASPART 100 UNIT/ML ~~LOC~~ SOLN
0.0000 [IU] | Freq: Every day | SUBCUTANEOUS | Status: DC
  Administered 2013-06-04: 3 [IU] via SUBCUTANEOUS
  Administered 2013-06-05: 4 [IU] via SUBCUTANEOUS

## 2013-06-04 NOTE — Progress Notes (Signed)
Subjective: He was admitted last night with worsening trouble with pancreatitis and what appeared to be in mild diabetic ketoacidosis. He says this morning he feels better. He is hungry. His blood sugar has been less than 200 for the last 3 checks. His abdominal pain is better  Objective: Vital signs in last 24 hours: Temp:  [97.9 F (36.6 C)-98.7 F (37.1 C)] 98.2 F (36.8 C) (01/03 0730) Pulse Rate:  [88-122] 92 (01/03 0800) Resp:  [10-26] 10 (01/03 0700) BP: (114-144)/(69-101) 114/80 mmHg (01/03 0800) SpO2:  [93 %-100 %] 94 % (01/03 0800) Weight:  [99.6 kg (219 lb 9.3 oz)-102.3 kg (225 lb 8.5 oz)] 102.3 kg (225 lb 8.5 oz) (01/03 0500) Weight change:     Intake/Output from previous day: 01/02 0701 - 01/03 0700 In: 1000 [I.V.:1000] Out: -   PHYSICAL EXAM General appearance: alert, cooperative and mild distress Resp: clear to auscultation bilaterally Cardio: regular rate and rhythm, S1, S2 normal, no murmur, click, rub or gallop GI: Mildly diffusely tender particularly in the epigastric region Extremities: extremities normal, atraumatic, no cyanosis or edema  Lab Results:    Basic Metabolic Panel:  Recent Labs  06/04/13 0246 06/04/13 0531  NA 134* 134*  K 3.4* 3.5*  CL 99 99  CO2 25 24  GLUCOSE 165* 159*  BUN 15 15  CREATININE 0.57 0.51  CALCIUM 9.5 9.4   Liver Function Tests:  Recent Labs  06/03/13 1040  AST 10  ALT 12  ALKPHOS 147*  BILITOT 0.3  PROT 9.1*  ALBUMIN 3.5    Recent Labs  06/03/13 1040 06/04/13 0531  LIPASE 192* 211*   No results found for this basename: AMMONIA,  in the last 72 hours CBC:  Recent Labs  06/03/13 1040 06/03/13 1959 06/04/13 0531  WBC 12.7* 13.2* 12.4*  NEUTROABS 8.7*  --  7.3  HGB 14.6 14.9 13.5  HCT 41.2 42.1 38.4*  MCV 92.4 91.3 90.8  PLT 278 260 281   Cardiac Enzymes: No results found for this basename: CKTOTAL, CKMB, CKMBINDEX, TROPONINI,  in the last 72 hours BNP: No results found for this  basename: PROBNP,  in the last 72 hours D-Dimer: No results found for this basename: DDIMER,  in the last 72 hours CBG:  Recent Labs  06/04/13 0418 06/04/13 0512 06/04/13 0614 06/04/13 0719 06/04/13 0815 06/04/13 0941  GLUCAP 154* 152* 153* 171* 170* 179*   Hemoglobin A1C: No results found for this basename: HGBA1C,  in the last 72 hours Fasting Lipid Panel: No results found for this basename: CHOL, HDL, LDLCALC, TRIG, CHOLHDL, LDLDIRECT,  in the last 72 hours Thyroid Function Tests: No results found for this basename: TSH, T4TOTAL, FREET4, T3FREE, THYROIDAB,  in the last 72 hours Anemia Panel: No results found for this basename: VITAMINB12, FOLATE, FERRITIN, TIBC, IRON, RETICCTPCT,  in the last 72 hours Coagulation: No results found for this basename: LABPROT, INR,  in the last 72 hours Urine Drug Screen: Drugs of Abuse     Component Value Date/Time   LABOPIA NONE DETECTED 03/11/2008 0801   COCAINSCRNUR NONE DETECTED 03/11/2008 0801   LABBENZ NONE DETECTED 03/11/2008 0801   AMPHETMU NONE DETECTED 03/11/2008 0801   THCU NONE DETECTED 03/11/2008 0801   LABBARB  Value: NONE DETECTED        DRUG SCREEN FOR MEDICAL PURPOSES ONLY.  IF CONFIRMATION IS NEEDED FOR ANY PURPOSE, NOTIFY LAB WITHIN 5 DAYS. 03/11/2008 0801    Alcohol Level: No results found for this basename: ETH,  in the last 72 hours Urinalysis: No results found for this basename: COLORURINE, APPERANCEUR, LABSPEC, PHURINE, GLUCOSEU, HGBUR, BILIRUBINUR, KETONESUR, PROTEINUR, UROBILINOGEN, NITRITE, LEUKOCYTESUR,  in the last 72 hours Misc. Labs:  ABGS No results found for this basename: PHART, PCO2, PO2ART, TCO2, HCO3,  in the last 72 hours CULTURES Recent Results (from the past 240 hour(s))  MRSA PCR SCREENING     Status: None   Collection Time    06/03/13  8:25 PM      Result Value Range Status   MRSA by PCR NEGATIVE  NEGATIVE Final   Comment:            The GeneXpert MRSA Assay (FDA     approved for NASAL  specimens     only), is one component of a     comprehensive MRSA colonization     surveillance program. It is not     intended to diagnose MRSA     infection nor to guide or     monitor treatment for     MRSA infections.   Studies/Results: Acute Abdominal Series  06/04/2013   CLINICAL DATA:  Pancreatitis  EXAM: ACUTE ABDOMEN SERIES (ABDOMEN 2 VIEW & CHEST 1 VIEW)  COMPARISON:  None.  FINDINGS: Low lung volumes with interstitial opacities bilaterally. No effusion or cephalized pulmonary blood flow. Normal heart size.  No pneumoperitoneum or dilated bowel. No abnormal mass effect or intra-abdominal calcification. Negative osseous structures.  IMPRESSION: 1. Low lung volumes with basilar atelectasis. 2. Nonobstructive bowel gas pattern.   Electronically Signed   By: Jorje Guild M.D.   On: 06/04/2013 02:39    Medications:  Prior to Admission:  Prescriptions prior to admission  Medication Sig Dispense Refill  . divalproex (DEPAKOTE) 500 MG DR tablet Take 1,000 mg by mouth at bedtime.      Marland Kitchen Hydrocodone-Acetaminophen (VICODIN PO) Take 1 tablet by mouth every 6 (six) hours.      . insulin aspart (NOVOLOG) 100 UNIT/ML injection Inject 40 Units into the skin 2 (two) times daily.      . Insulin Aspart Prot & Aspart (NOVOLOG MIX 70/30 FLEXPEN) (70-30) 100 UNIT/ML SUPN Inject 70 Units into the skin 2 (two) times daily.      . Linaclotide (LINZESS) 145 MCG CAPS capsule Take 1 capsule (145 mcg total) by mouth daily.  5 capsule  0  . promethazine (PHENERGAN) 25 MG tablet Take 1 tablet (25 mg total) by mouth every 6 (six) hours as needed for nausea or vomiting.  15 tablet  0  . risperiDONE (RISPERDAL) 1 MG tablet Take 1 mg by mouth at bedtime.      . simvastatin (ZOCOR) 20 MG tablet Take 20 mg by mouth at bedtime.      . solifenacin (VESICARE) 10 MG tablet Take 10 mg by mouth daily.       Scheduled: . darifenacin  7.5 mg Oral Daily  . divalproex  1,000 mg Oral QHS  . enoxaparin (LOVENOX) injection   40 mg Subcutaneous Q24H  . insulin aspart  0-20 Units Subcutaneous TID WC  . insulin aspart  0-5 Units Subcutaneous QHS  . insulin aspart  6 Units Subcutaneous TID WC  . insulin detemir  30 Units Subcutaneous QHS  . Linaclotide  145 mcg Oral Daily  . risperiDONE  1 mg Oral QHS  . simvastatin  20 mg Oral QHS  . sodium chloride  3 mL Intravenous Q12H   Continuous: . sodium chloride 150 mL/hr at 06/03/13 2215  .  dextrose 5 % and 0.45% NaCl 50 mL/hr at 06/04/13 0010   SWF:[id number], alum & mag hydroxide-simeth, dextrose, HYDROcodone-acetaminophen, HYDROmorphone (DILAUDID) injection, ondansetron (ZOFRAN) IV, ondansetron, promethazine, zolpidem  Assesment: He has mild diabetic ketoacidosis and is doing better. He has cholelithiasis not cholecystitis clinically. He has bipolar disease. He has pancreatitis in gotten worse clinically and by laboratory. Active Problems:   DKA (diabetic ketoacidosis)   DKA (diabetic ketoacidoses)    Plan: I hope I can transition him off of the continuous insulin infusion. He'll have ultrasound of the abdomen. I'm going to go and get surgical consult is going to have to have cholecystectomy at some point although I don't think he will be done this admission.    LOS: 1 day   Sharon Rubis L 06/04/2013, 10:11 AM

## 2013-06-04 NOTE — Progress Notes (Addendum)
Nutrition Brief Note  Patient identified on the Malnutrition Screening Tool (MST) Report  Wt Readings from Last 15 Encounters:  06/04/13 225 lb 8.5 oz (102.3 kg)  06/03/13 223 lb 9.6 oz (101.424 kg)  05/28/13 225 lb (102.059 kg)    Body mass index is 29.76 kg/(m^2). Patient meets criteria for overweight based on current BMI.   Current diet order is full liquid, patient is consuming approximately n/a% of meals at this time. Labs and medications reviewed.   No nutrition interventions warranted at this time. If nutrition issues arise, please consult RD.   Edita Weyenberg A. Jimmye Norman, RD, LDN Pager: [phone]

## 2013-06-05 LAB — GLUCOSE, CAPILLARY
GLUCOSE-CAPILLARY: 306 mg/dL — AB (ref 70–99)
Glucose-Capillary: 315 mg/dL — ABNORMAL HIGH (ref 70–99)
Glucose-Capillary: 328 mg/dL — ABNORMAL HIGH (ref 70–99)
Glucose-Capillary: 260 mg/dL — ABNORMAL HIGH (ref 70–99)

## 2013-06-05 LAB — CBC WITH DIFFERENTIAL/PLATELET
BASOS PCT: 0 % (ref 0–1)
Basophils Absolute: 0 10*3/uL (ref 0.0–0.1)
EOS ABS: 0.7 10*3/uL (ref 0.0–0.7)
Eosinophils Relative: 8 % — ABNORMAL HIGH (ref 0–5)
HCT: 37.2 % — ABNORMAL LOW (ref 39.0–52.0)
Hemoglobin: 12.9 g/dL — ABNORMAL LOW (ref 13.0–17.0)
LYMPHS ABS: 3 10*3/uL (ref 0.7–4.0)
Lymphocytes Relative: 30 % (ref 12–46)
MCH: 31.5 pg (ref 26.0–34.0)
MCHC: 34.7 g/dL (ref 30.0–36.0)
MCV: 90.7 fL (ref 78.0–100.0)
Monocytes Absolute: 0.7 10*3/uL (ref 0.1–1.0)
Monocytes Relative: 7 % (ref 3–12)
NEUTROS PCT: 56 % (ref 43–77)
Neutro Abs: 5.5 10*3/uL (ref 1.7–7.7)
Platelets: 261 10*3/uL (ref 150–400)
RBC: 4.1 MIL/uL — AB (ref 4.22–5.81)
RDW: 12.7 % (ref 11.5–15.5)
WBC: 9.9 10*3/uL (ref 4.0–10.5)

## 2013-06-05 LAB — BASIC METABOLIC PANEL
BUN: 12 mg/dL (ref 6–23)
CO2: 24 mEq/L (ref 19–32)
Calcium: 9.5 mg/dL (ref 8.4–10.5)
Chloride: 94 mEq/L — ABNORMAL LOW (ref 96–112)
Creatinine, Ser: 0.54 mg/dL (ref 0.50–1.35)
Glucose, Bld: 296 mg/dL — ABNORMAL HIGH (ref 70–99)
Potassium: 3.5 mEq/L — ABNORMAL LOW (ref 3.7–5.3)
SODIUM: 133 meq/L — AB (ref 137–147)

## 2013-06-05 LAB — LIPASE, BLOOD: Lipase: 88 U/L — ABNORMAL HIGH (ref 11–59)

## 2013-06-05 MED ORDER — INSULIN DETEMIR 100 UNIT/ML ~~LOC~~ SOLN
50.0000 [IU] | Freq: Every day | SUBCUTANEOUS | Status: DC
  Administered 2013-06-05: 50 [IU] via SUBCUTANEOUS
  Filled 2013-06-05 (×2): qty 0.5

## 2013-06-05 MED ORDER — INSULIN ASPART 100 UNIT/ML ~~LOC~~ SOLN
10.0000 [IU] | Freq: Three times a day (TID) | SUBCUTANEOUS | Status: DC
  Administered 2013-06-05 – 2013-06-06 (×2): 10 [IU] via SUBCUTANEOUS

## 2013-06-05 MED ORDER — INFLUENZA VAC SPLIT QUAD 0.5 ML IM SUSP
0.5000 mL | INTRAMUSCULAR | Status: AC
  Administered 2013-06-06: 0.5 mL via INTRAMUSCULAR
  Filled 2013-06-05: qty 0.5

## 2013-06-05 NOTE — Progress Notes (Signed)
[name], [name]           ACCOUNT NO.:  [id number]  MEDICAL RECORD NO.:  [id number]  LOCATION:  D326                          FACILITY:  [facility]  PHYSICIAN:  Xinyi Batton L. Luan Pulling, M.D.DATE OF BIRTH:  11/15/1975  DATE OF PROCEDURE: DATE OF DISCHARGE:                                PROGRESS NOTE   [name] was admitted with pancreatitis, abdominal pain, cholelithiasis, but not cholecystitis, diabetes, mild DKA.  He says he feels better.  He was able to eat without much difficulty.  He has no other new complaints.  He wants to know when he is going to be able to go home.  PHYSICAL EXAMINATION:  VITAL SIGNS:  Shows his temperature is 98.3, pulse 93, respirations 20, blood pressure 130/86, O2 sats 96%. CHEST:  Clear. ABDOMEN:  Minimally if any tender.  His bowel sounds are present and active. EXTREMITIES:  No edema.  Laboratory work shows that his white count is down to 9900.  His sodium 133, potassium 3.5, his lipase is down in the 80s now.  Blood sugar has been in the 300.  My assessment then is that he has diabetic ketoacidosis that was mild. His blood sugar is better, but not controlled.  He has pancreatitis which seems to be improving.  He has cholelithiasis without cholecystitis and he has bipolar disease.  His blood sugar has been up and I think some of it is because as part of protocol.  He was given D5 W IV and I am going to discontinue that.  I am going to advance his diet.  I am going to add mealtime insulin.  He will have surgical and GI consultation.  I do not think he will be ready for surgery this admission, but I think he will need to have a cholecystectomy at some point.  He understands.     Monette Omara L. Luan Pulling, M.D.     ELH/MEDQ  D:  06/05/2013  T:  06/05/2013  Job:  [id number]

## 2013-06-06 DIAGNOSIS — K8020 Calculus of gallbladder without cholecystitis without obstruction: Secondary | ICD-10-CM | POA: Diagnosis present

## 2013-06-06 DIAGNOSIS — E1010 Type 1 diabetes mellitus with ketoacidosis without coma: Secondary | ICD-10-CM

## 2013-06-06 DIAGNOSIS — K859 Acute pancreatitis without necrosis or infection, unspecified: Secondary | ICD-10-CM

## 2013-06-06 LAB — CBC WITH DIFFERENTIAL/PLATELET
HCT: 42 %
HGB: 14.4 g/dL
LIPASE: 115 U/L — AB (ref 0–53)
Platelets: 251 10*3/uL
WBC: 12.6

## 2013-06-06 LAB — HEPATIC FUNCTION PANEL
ALT: 13 U/L (ref 0–53)
AST: 18 U/L (ref 0–37)
Albumin: 2.9 g/dL — ABNORMAL LOW (ref 3.5–5.2)
Alkaline Phosphatase: 121 U/L — ABNORMAL HIGH (ref 39–117)
Bilirubin, Direct: <0.2 mg/dL (ref 0.0–0.3)
Total Protein: 7.5 g/dL (ref 6.0–8.3)

## 2013-06-06 LAB — GLUCOSE, CAPILLARY: Glucose-Capillary: 214 mg/dL — ABNORMAL HIGH (ref 70–99)

## 2013-06-06 LAB — TRIGLYCERIDES: TRIGLYCERIDES: 171 mg/dL — AB (ref <150)

## 2013-06-06 NOTE — Progress Notes (Signed)
Subjective:  Please see full consult note from 06/03/13. I encouraged patient to be admitted to hospital for pancreatitis and glucose/electrolyte abnormalities but at that time he refused. Subsequently was admitted. He has h/o first episode of pancreatitis diagnosed on 05/28/13 in ER by CT and lipase. Patient was not admitted at that time. He had mild pancreatitis and cholelithiasis on CT. F/u abd u/s on 06/03/13 showed cholelithiasis. No biliary dilation. Only new medication was vesicare. Patient is feeling much better and is ready to go home. Abdominal pain minimal at this time. Tolerating low-fat diet.   Objective: Vital signs in last 24 hours: Temp:  [97.7 F (36.5 C)-98.1 F (36.7 C)] 97.7 F (36.5 C) (01/05 0557) Pulse Rate:  [71-89] 71 (01/05 0557) Resp:  [20] 20 (01/05 0557) BP: (115-124)/(73-83) 115/73 mmHg (01/05 0557) SpO2:  [97 %-99 %] 97 % (01/05 0557) Weight:  [223 lb 3.2 oz (101.243 kg)] 223 lb 3.2 oz (101.243 kg) (01/05 0557) Last BM Date: 06/04/13 General:   Alert,  Well-developed, well-nourished, pleasant and cooperative in NAD Head:  Normocephalic and atraumatic. Eyes:  Sclera clear, no icterus.   Abdomen:  Soft, nontender and nondistended. No masses, hepatosplenomegaly or hernias noted. Normal bowel sounds, without guarding, and without rebound.   Extremities:  Without clubbing, deformity or edema. Neurologic:  Alert and  oriented x4;  grossly normal neurologically. Skin:  Intact without significant lesions or rashes. Psych:  Alert and cooperative. Normal mood and affect.  Intake/Output from previous day: 01/04 0701 - 01/05 0700 In: 1080 [P.O.:1080] Out: -  Intake/Output this shift:    Lab Results: CBC  Recent Labs  06/03/13 1959 06/04/13 0531 06/05/13 0626  WBC 13.2* 12.4* 9.9  HGB 14.9 13.5 12.9*  HCT 42.1 38.4* 37.2*  MCV 91.3 90.8 90.7  PLT 260 281 261   BMET  Recent Labs  06/04/13 0246 06/04/13 0531 06/05/13 0626  NA 134* 134* 133*  K 3.4*  3.5* 3.5*  CL 99 99 94*  CO2 25 24 24   GLUCOSE 165* 159* 296*  BUN 15 15 12   CREATININE 0.57 0.51 0.54  CALCIUM 9.5 9.4 9.5   LFTs  Recent Labs  06/03/13 1040  BILITOT 0.3  BILIDIR <0.1  ALKPHOS 147*  AST 10  ALT 12  PROT 9.1*  ALBUMIN 3.5    Recent Labs  06/03/13 1040 06/04/13 0531 06/05/13 0626  LIPASE 192* 211* 88*   PT/INR No results found for this basename: LABPROT, INR,  in the last 72 hours    Imaging Studies: Ct Abdomen Pelvis Wo Contrast  05/29/2013   CLINICAL DATA:  Left-sided flank pain.  EXAM: CT ABDOMEN AND PELVIS WITHOUT CONTRAST  TECHNIQUE: Multidetector CT imaging of the abdomen and pelvis was performed following the standard protocol without intravenous contrast.  COMPARISON:  CT of the abdomen and pelvis from 11/30/2008  FINDINGS: Minimal bibasilar atelectasis is noted.  Mild soft tissue stranding is noted about the body and tail of the pancreas, raising concern for mild acute pancreatitis. The head of the pancreas is unremarkable in appearance. There is no evidence of pseudocyst formation. Evaluation for devascularization is limited without contrast.  There is diffuse fatty infiltration within the liver. The liver is otherwise unremarkable in appearance. The spleen is within normal limits. A small stone is noted within the gallbladder; the gallbladder is otherwise unremarkable. The adrenal glands are unremarkable.  The kidneys are unremarkable in appearance. There is no evidence of hydronephrosis. No renal or ureteral stones are seen. No perinephric  stranding is appreciated.  No free fluid is identified. The small bowel is unremarkable in appearance. The stomach is within normal limits. No acute vascular abnormalities are seen. Scattered calcification is noted along the abdominal aorta and its branches, advanced for age.  The patient is status post appendectomy. Contrast progresses to the level of the transverse colon. The colon is unremarkable in appearance.   The bladder is mildly distended and grossly unremarkable. The prostate remains normal in size. No inguinal lymphadenopathy is seen.  No acute osseous abnormalities are identified.  IMPRESSION: 1. Mild acute pancreatitis, with mild soft tissue inflammation about the body and tail of the pancreas. No evidence of pseudocyst formation. Evaluation for devascularization is limited without contrast. 2. Scattered calcification along the abdominal aorta and its branches, advanced for age. 3. Diffuse fatty infiltration within the liver. 4. Cholelithiasis; gallbladder otherwise unremarkable in appearance.   Electronically Signed   By: Garald Balding M.D.   On: 05/29/2013 00:07   US Abdomen Complete  06/04/2013   CLINICAL DATA:  Pancreatitis, cholecystitis  EXAM: ULTRASOUND ABDOMEN COMPLETE  COMPARISON:  CT abdomen 05/28/2013  FINDINGS: Gallbladder:  There are gallstones present. There is no pericholecystic fluid or gallbladder wall thickening. Negative sonographic Murphy's sign.  Common bile duct:  Diameter: 5.3 mm  Liver:  No focal lesion identified. The liver is increased in echogenicity as can be seen with hepatic steatosis. There is relative hypo echogenicity surrounding the gallbladder fossa likely reflecting focal fatty sparing.  IVC:  No abnormality visualized.  Pancreas:  Visualized portion unremarkable. The pancreatic tail is obscured by overlying bowel gas.  Spleen:  Size and appearance within normal limits.  Right Kidney:  Length: 13.5 cm. Echogenicity within normal limits. No mass or hydronephrosis visualized.  Left Kidney:  Length: 12.6 cm. Echogenicity within normal limits. No mass or hydronephrosis visualized.  Abdominal aorta:  No aneurysm visualized.  Other findings:  None.  IMPRESSION: 1. Cholelithiasis without sonographic evidence of acute cholecystitis.  2.  Hepatic steatosis.   Electronically Signed   By: Kathreen Devoid   On: 06/04/2013 10:56   Acute Abdominal Series  06/04/2013   CLINICAL DATA:   Pancreatitis  EXAM: ACUTE ABDOMEN SERIES (ABDOMEN 2 VIEW & CHEST 1 VIEW)  COMPARISON:  None.  FINDINGS: Low lung volumes with interstitial opacities bilaterally. No effusion or cephalized pulmonary blood flow. Normal heart size.  No pneumoperitoneum or dilated bowel. No abnormal mass effect or intra-abdominal calcification. Negative osseous structures.  IMPRESSION: 1. Low lung volumes with basilar atelectasis. 2. Nonobstructive bowel gas pattern.   Electronically Signed   By: Jorje Guild M.D.   On: 06/04/2013 02:39  [2 weeks]   Assessment: 38 y/o male with history of insulin-dependent diabetes who was admitted for pancreatitis and DKA. Noted to have gallstones. No etoh history. Only new medication was vesicare which is not likely cause of pancreatitis. Zocor is a Class 1a drug and Risperidone Class IV with association with pancreatitis (although still very rare to develop drug-induced pancreatitis). We need to evaluate his triglycerides and recheck his LFTs. Suspect biliary etiology for his pancreatitis.    Plan: 1. Discussed with Dr. Luan Pulling, patient is clinically stable for discharge from GI standpoint.  2. We will add-on LFTs and triglycerides.  3. Consider elective cholecystectomy.  4. Outpatient follow-up.   LOS: 3 days   Neil Crouch  06/06/2013, 9:56 AM

## 2013-06-06 NOTE — Progress Notes (Signed)
cc'd to pcp 

## 2013-06-06 NOTE — Telephone Encounter (Signed)
Routing to [employer].

## 2013-06-06 NOTE — Progress Notes (Signed)
Utilization Review Complete  

## 2013-06-06 NOTE — Telephone Encounter (Signed)
Discussed with patient at hospital today. Likely not necessary to see him this week, would prefer waiting couple of weeks. OK to leave as scheduled if patient wishes (we probably should ask the patient since he is of age).

## 2013-06-06 NOTE — Telephone Encounter (Signed)
Pt's mother called back about the appointment. Mother wants to keep appointment for 06/09/13 at 2:30. Pt's mother states pt is in the hospital and should be out by Thursday if not she will call back 06/08/13 to cancel.

## 2013-06-06 NOTE — Telephone Encounter (Signed)
Called both numbers for pt, LMOM about appointment 06/09/13 2:30.

## 2013-06-06 NOTE — Progress Notes (Signed)
Patient being d/c home with instructions. IV cath removed and intact. No pain/swelling at site. Will follow up with GI if have increased abdominal pain.

## 2013-06-07 ENCOUNTER — Encounter: Payer: Self-pay | Admitting: Gastroenterology

## 2013-06-07 NOTE — Discharge Summary (Signed)
Physician Discharge Summary  Patient ID: [name] MRN: [id number] DOB/AGE: [birth year] 38 y.o. Primary Care Physician:Nicolaos Mitrano L, MD Admit date: 06/03/2013 Discharge date: 06/07/2013    Discharge Diagnoses:   Active Problems:   Abnormal LFTs   Pancreatitis, acute   DKA (diabetic ketoacidosis)   DKA (diabetic ketoacidoses)   Bipolar 1 disorder, mixed, moderate   Cholelithiasis     Medication List         divalproex 500 MG DR tablet  Commonly known as:  DEPAKOTE  Take 1,000 mg by mouth at bedtime.     insulin aspart 100 UNIT/ML injection  Commonly known as:  novoLOG  Inject 40 Units into the skin 2 (two) times daily.     Linaclotide 145 MCG Caps capsule  Commonly known as:  LINZESS  Take 1 capsule (145 mcg total) by mouth daily.     NOVOLOG MIX 70/30 FLEXPEN (70-30) 100 UNIT/ML Pen  Generic drug:  Insulin Aspart Prot & Aspart  Inject 70 Units into the skin 2 (two) times daily.     promethazine 25 MG tablet  Commonly known as:  PHENERGAN  Take 1 tablet (25 mg total) by mouth every 6 (six) hours as needed for nausea or vomiting.     risperiDONE 1 MG tablet  Commonly known as:  RISPERDAL  Take 1 mg by mouth at bedtime.     simvastatin 20 MG tablet  Commonly known as:  ZOCOR  Take 20 mg by mouth at bedtime.     solifenacin 10 MG tablet  Commonly known as:  VESICARE  Take 10 mg by mouth daily.     VICODIN PO  Take 1 tablet by mouth every 6 (six) hours.     HYDROcodone-acetaminophen 10-325 MG per tablet  Commonly known as:  NORCO  Take 1 tablet by mouth 4 (four) times daily.        Discharged Condition: Improved    Consults: Gastroenterology  Significant Diagnostic Studies: Ct Abdomen Pelvis Wo Contrast  05/29/2013   CLINICAL DATA:  Left-sided flank pain.  EXAM: CT ABDOMEN AND PELVIS WITHOUT CONTRAST  TECHNIQUE: Multidetector CT imaging of the abdomen and pelvis was performed following the standard protocol without intravenous contrast.   COMPARISON:  CT of the abdomen and pelvis from 11/30/2008  FINDINGS: Minimal bibasilar atelectasis is noted.  Mild soft tissue stranding is noted about the body and tail of the pancreas, raising concern for mild acute pancreatitis. The head of the pancreas is unremarkable in appearance. There is no evidence of pseudocyst formation. Evaluation for devascularization is limited without contrast.  There is diffuse fatty infiltration within the liver. The liver is otherwise unremarkable in appearance. The spleen is within normal limits. A small stone is noted within the gallbladder; the gallbladder is otherwise unremarkable. The adrenal glands are unremarkable.  The kidneys are unremarkable in appearance. There is no evidence of hydronephrosis. No renal or ureteral stones are seen. No perinephric stranding is appreciated.  No free fluid is identified. The small bowel is unremarkable in appearance. The stomach is within normal limits. No acute vascular abnormalities are seen. Scattered calcification is noted along the abdominal aorta and its branches, advanced for age.  The patient is status post appendectomy. Contrast progresses to the level of the transverse colon. The colon is unremarkable in appearance.  The bladder is mildly distended and grossly unremarkable. The prostate remains normal in size. No inguinal lymphadenopathy is seen.  No acute osseous abnormalities are identified.  IMPRESSION: 1. Mild acute  pancreatitis, with mild soft tissue inflammation about the body and tail of the pancreas. No evidence of pseudocyst formation. Evaluation for devascularization is limited without contrast. 2. Scattered calcification along the abdominal aorta and its branches, advanced for age. 3. Diffuse fatty infiltration within the liver. 4. Cholelithiasis; gallbladder otherwise unremarkable in appearance.   Electronically Signed   By: Garald Balding M.D.   On: 05/29/2013 00:07   US Abdomen Complete  06/04/2013   CLINICAL DATA:   Pancreatitis, cholecystitis  EXAM: ULTRASOUND ABDOMEN COMPLETE  COMPARISON:  CT abdomen 05/28/2013  FINDINGS: Gallbladder:  There are gallstones present. There is no pericholecystic fluid or gallbladder wall thickening. Negative sonographic Murphy's sign.  Common bile duct:  Diameter: 5.3 mm  Liver:  No focal lesion identified. The liver is increased in echogenicity as can be seen with hepatic steatosis. There is relative hypo echogenicity surrounding the gallbladder fossa likely reflecting focal fatty sparing.  IVC:  No abnormality visualized.  Pancreas:  Visualized portion unremarkable. The pancreatic tail is obscured by overlying bowel gas.  Spleen:  Size and appearance within normal limits.  Right Kidney:  Length: 13.5 cm. Echogenicity within normal limits. No mass or hydronephrosis visualized.  Left Kidney:  Length: 12.6 cm. Echogenicity within normal limits. No mass or hydronephrosis visualized.  Abdominal aorta:  No aneurysm visualized.  Other findings:  None.  IMPRESSION: 1. Cholelithiasis without sonographic evidence of acute cholecystitis.  2.  Hepatic steatosis.   Electronically Signed   By: Kathreen Devoid   On: 06/04/2013 10:56   Acute Abdominal Series  06/04/2013   CLINICAL DATA:  Pancreatitis  EXAM: ACUTE ABDOMEN SERIES (ABDOMEN 2 VIEW & CHEST 1 VIEW)  COMPARISON:  None.  FINDINGS: Low lung volumes with interstitial opacities bilaterally. No effusion or cephalized pulmonary blood flow. Normal heart size.  No pneumoperitoneum or dilated bowel. No abnormal mass effect or intra-abdominal calcification. Negative osseous structures.  IMPRESSION: 1. Low lung volumes with basilar atelectasis. 2. Nonobstructive bowel gas pattern.   Electronically Signed   By: Jorje Guild M.D.   On: 06/04/2013 02:39    Lab Results: Basic Metabolic Panel:  Recent Labs  06/05/13 0626  NA 133*  K 3.5*  CL 94*  CO2 24  GLUCOSE 296*  BUN 12  CREATININE 0.54  CALCIUM 9.5   Liver Function Tests:  Recent Labs   06/06/13 0952  AST 18  ALT 13  ALKPHOS 121*  BILITOT <0.2*  PROT 7.5  ALBUMIN 2.9*     CBC:  Recent Labs  06/05/13 0626  WBC 9.9  NEUTROABS 5.5  HGB 12.9*  HCT 37.2*  MCV 90.7  PLT 261    Recent Results (from the past 240 hour(s))  MRSA PCR SCREENING     Status: None   Collection Time    06/03/13  8:25 PM      Result Value Range Status   MRSA by PCR NEGATIVE  NEGATIVE Final   Comment:            The GeneXpert MRSA Assay (FDA     approved for NASAL specimens     only), is one component of a     comprehensive MRSA colonization     surveillance program. It is not     intended to diagnose MRSA     infection nor to guide or     monitor treatment for     MRSA infections.     Hospital Course: This is a 38 year old who was diagnosed with pancreatitis  approximately a week ago in the emergency department. He was able to keep down fluids and his pain was not too bad so he was discharged home. He has followed up in my office twice and seemed to be doing pretty well and arrangements were made for him to have GI consultation. When he went to the GI physicians office he was found to be in some distress his lipase had gone up and he was having much more problem with keeping food down with abdominal pain. It was strongly suggested that he be admitted to the hospital but he refused. Later that day he agreed that he would go to the hospital. He was found to be in mild diabetic ketoacidosis. He was admitted to this and was treated with intravenous insulin he was made n.p.o. given pain medications and improved. His abdominal pain improved rapidly his blood sugar came under much better control. He was transferred to the floor his diet was advanced with no difficulty. He had GI consultation it was felt that he did not need any further inpatient procedures.  Discharge Exam: Blood pressure 115/73, pulse 71, temperature 97.7 F (36.5 C), temperature source Oral, resp. rate 20, height 6\' 1"  (1.854  m), weight 101.243 kg (223 lb 3.2 oz), SpO2 97.00%. He is awake and alert. His chest is clear. His heart is regular. His abdomen is soft.  Disposition: Home he will need surgical consultation for cholecystectomy      Discharge Orders   Future Appointments Provider Department Dept Phone   06/09/2013 2:30 PM [facility] [phone]   Future Orders Complete By Expires   Discharge patient  As directed         Signed: Maeli Spacek L Pager [phone]  06/07/2013, 8:55 AM

## 2013-06-07 NOTE — Telephone Encounter (Signed)
Pt went home yesterday from the hospital. Called pt and [facility] to return my call about changing appointment.

## 2013-06-09 ENCOUNTER — Ambulatory Visit: Payer: Medicaid Other | Admitting: Gastroenterology

## 2013-06-30 ENCOUNTER — Encounter (HOSPITAL_COMMUNITY): Payer: Self-pay

## 2013-06-30 NOTE — H&P (Signed)
  NTS SOAP Note  Vital Signs:  Vitals as of: 7/74/1287: Systolic 867: Diastolic 89: Heart Rate 672: Temp 97.43F: Height 88ft 1in: Weight 237Lbs 0 Ounces: BMI 31.27  BMI : 31.27 kg/m2  Subjective:w This 38 Years 10 Months old Male presents for of gallstone pancreatitis. Was hospitalized, found on u/s to have cholelithiasis, normal common bile duct.  Now presents for cholecystectomy.  Currently is asymptomatic.   Review of Symptoms:  Constitutional:unremarkable   Head:unremarkable    Eyes:unremarkable   Nose/Mouth/Throat:unremarkable Cardiovascular:  unremarkable   Respiratory:unremarkable   Gastrointestinal:  unremarkable   Genitourinary:unremarkable     Musculoskeletal:unremarkable   Skin:unremarkable Hematolgic/Lymphatic:unremarkable     Allergic/Immunologic:unremarkable     Past Medical History:    Reviewed  Past Medical History  Surgical History: knee surgery, appendectomy Medical Problems: IDDM, bipolar disorder Allergies: nkda Medications: depakote, insulin, linzess, risperidone, phenergansimvastatin, folifenacin, vicodin   Social History:Reviewed  Social History  Preferred Language: English Race:  Black or African American Ethnicity: Not Hispanic / Latino Age: 38 Years 0 Months Marital Status:  S Alcohol: unknown   Smoking Status: Current every day smoker reviewed on 06/30/2013 Started Date: 06/02/1996 Packs per day: 1.00 Functional Status reviewed on 06/30/2013 ------------------------------------------------ Bathing: Normal Cooking: Normal Dressing: Normal Driving: Normal Eating: Normal Managing Meds: Normal Oral Care: Normal Shopping: Normal Toileting: Normal Transferring: Normal Walking: Normal Cognitive Status reviewed on 06/30/2013 ------------------------------------------------ Attention: Normal Decision Making: Normal Language: Normal Memory: Normal Motor: Normal Perception: Normal Problem  Solving: Normal Visual and Spatial: Normal   Family History:  [place] History Mother, Living; Diabetes mellitus, unspecified type;  Father, Living; Healthy; healthy    Objective Information: General:  Well appearing, well nourished in no distress.   no scleral icterus Heart:  RRR, no murmur Lungs:    CTA bilaterally, no wheezes, rhonchi, rales.  Breathing unlabored. Abdomen:Soft, NT/ND, normal bowel sounds, no HSM, no masses.  No peritoneal signs.  Assessment:Cholelithiasis, biliary colic, h/o pancreatitis  Diagnoses: 574.20 Gallstone (Calculus of gallbladder without cholecystitis without obstruction)  Procedures: 09470 - OFFICE OUTPATIENT NEW 30 MINUTES    Plan:  Scheduled for laparoscopic cholecystectomy with cholangiograms on 07/11/13.   Patient Education:Alternative treatments to surgery were discussed with patient (and family).  Risks and benefits  of procedure including bleeding, infection, hepatobiliary injury, and the possibility of an open procedure were fully explained to the patient (and family) who gave informed consent. Patient/family questions were addressed.  Follow-up:Pending Surgery

## 2013-07-04 NOTE — Patient Instructions (Signed)
[name]  07/04/2013   Your procedure is scheduled on:  Monday,February 9  Report to [facility] at 0710 AM.  Call this number if you have problems the morning of surgery: [phone]   Remember:   Do not eat food or drink liquids after midnight.   Take these medicines the morning of surgery with A SIP OF WATER:    Do not wear jewelry, make-up or nail polish.  Do not wear lotions, powders, or perfumes. You may wear deodorant.  Do not shave 48 hours prior to surgery. Men may shave face and neck.  Do not bring valuables to the hospital.  [facility] is not responsible for any belongings or valuables.               Contacts, dentures or bridgework may not be worn into surgery.  Leave suitcase in the car. After surgery it may be brought to your room.  For patients admitted to the hospital, discharge time is determined by your  treatment team.               Patients discharged the day of surgery will not be allowed to drive home.  Name and phone number of your driver: Family or friend  Special Instructions: Shower using CHG 2 nights before surgery and the night before surgery.  If you shower the day of surgery use CHG.  Use special wash - you have one bottle of CHG for all showers.  You should use approximately 1/3 of the bottle for each shower.   Please read over the following fact sheets that you were given: Pain Booklet, Coughing and Deep Breathing, Surgical Site Infection Prevention, Anesthesia Post-op Instructions and Care and Recovery After Surgery  PATIENT INSTRUCTIONS POST-ANESTHESIA  IMMEDIATELY FOLLOWING SURGERY:  Do not drive or operate machinery for the first twenty four hours after surgery.  Do not make any important decisions for twenty four hours after surgery or while taking narcotic pain medications or sedatives.  If you develop intractable nausea and vomiting or a severe headache please notify your doctor immediately.  FOLLOW-UP:  Please make an appointment with  your surgeon as instructed. You do not need to follow up with anesthesia unless specifically instructed to do so.  WOUND CARE INSTRUCTIONS (if applicable):  Keep a dry clean dressing on the anesthesia/puncture wound site if there is drainage.  Once the wound has quit draining you may leave it open to air.  Generally you should leave the bandage intact for twenty four hours unless there is drainage.  If the epidural site drains for more than 36-48 hours please call the anesthesia department.  QUESTIONS?:  Please feel free to call your physician or the hospital operator if you have any questions, and they will be happy to assist you.     Laparoscopic Cholecystectomy Laparoscopic cholecystectomy is surgery to remove the gallbladder. The gallbladder is located in the upper right part of the abdomen, behind the liver. It is a storage sac for bile produced in the liver. Bile aids in the digestion and absorption of fats. Cholecystectomy is often done for inflammation of the gallbladder (cholecystitis). This condition is usually caused by a buildup of gallstones (cholelithiasis) in your gallbladder. Gallstones can block the flow of bile, resulting in inflammation and pain. In severe cases, emergency surgery may be required. When emergency surgery is not required, you will have time to prepare for the procedure. Laparoscopic surgery is an alternative to open surgery. Laparoscopic surgery has a  shorter recovery time. Your common bile duct may also need to be examined during the procedure. If stones are found in the common bile duct, they may be removed. LET [facility] CARE PROVIDER KNOW ABOUT:  Any allergies you have.  All medicines you are taking, including vitamins, herbs, eye drops, creams, and over-the-counter medicines.  Previous problems you or members of your family have had with the use of anesthetics.  Any blood disorders you have.  Previous surgeries you have had.  Medical conditions you  have. RISKS AND COMPLICATIONS Generally, this is a safe procedure. However, as with any procedure, complications can occur. Possible complications include:  Infection.  Damage to the common bile duct, nerves, arteries, veins, or other internal organs such as the stomach, liver, or intestines.  Bleeding.  A stone may remain in the common bile duct.  A bile leak from the cyst duct that is clipped when your gallbladder is removed.  The need to convert to open surgery, which requires a larger incision in the abdomen. This may be necessary if your surgeon thinks it is not safe to continue with a laparoscopic procedure. BEFORE THE PROCEDURE  Ask your health care provider about changing or stopping any regular medicines. You will need to stop taking aspirin or blood thinners at least 5 days prior to surgery.  Do not eat or drink anything after midnight the night before surgery.  Let your health care provider know if you develop a cold or other infectious problem before surgery. PROCEDURE   You will be given medicine to make you sleep through the procedure (general anesthetic). A breathing tube will be placed in your mouth.  When you are asleep, your surgeon will make several small cuts (incisions) in your abdomen.  A thin, lighted tube with a tiny camera on the end (laparoscope) is inserted through one of the small incisions. The camera on the laparoscope sends a picture to a TV screen in the operating room. This gives the surgeon a good view inside your abdomen.  A gas will be pumped into your abdomen. This expands your abdomen so that the surgeon has more room to perform the surgery.  Other tools needed for the procedure are inserted through the other incisions. The gallbladder is removed through one of the incisions.  After the removal of your gallbladder, the incisions will be closed with stitches, staples, or skin glue. AFTER THE PROCEDURE  You will be taken to a recovery area  where your progress will be checked often.  You may be allowed to go home the same day if your pain is controlled and you can tolerate liquids. Document Released: 05/19/2005 Document Revised: 03/09/2013 Document Reviewed: 12/29/2012 [facility] Patient Information 2014 [place].

## 2013-07-05 ENCOUNTER — Encounter (HOSPITAL_COMMUNITY)
Admission: RE | Admit: 2013-07-05 | Discharge: 2013-07-05 | Disposition: A | Discharge status: Home / Self Care | Payer: Medicaid Other | Source: Ambulatory Visit | Attending: General Surgery | Admitting: General Surgery

## 2013-07-05 ENCOUNTER — Encounter (HOSPITAL_COMMUNITY): Payer: Self-pay

## 2013-07-05 DIAGNOSIS — Z01812 Encounter for preprocedural laboratory examination: Secondary | ICD-10-CM | POA: Insufficient documentation

## 2013-07-05 LAB — CBC WITH DIFFERENTIAL/PLATELET
Basophils Absolute: 0 10*3/uL (ref 0.0–0.1)
Basophils Relative: 0 % (ref 0–1)
Eosinophils Absolute: 0.8 10*3/uL — ABNORMAL HIGH (ref 0.0–0.7)
Eosinophils Relative: 6 % — ABNORMAL HIGH (ref 0–5)
HEMATOCRIT: 46.4 % (ref 39.0–52.0)
HEMOGLOBIN: 15.9 g/dL (ref 13.0–17.0)
LYMPHS ABS: 3.3 10*3/uL (ref 0.7–4.0)
Lymphocytes Relative: 26 % (ref 12–46)
MCH: 32.1 pg (ref 26.0–34.0)
MCHC: 34.3 g/dL (ref 30.0–36.0)
MCV: 93.7 fL (ref 78.0–100.0)
MONO ABS: 1.5 10*3/uL — AB (ref 0.1–1.0)
Monocytes Relative: 12 % (ref 3–12)
NEUTROS PCT: 56 % (ref 43–77)
Neutro Abs: 7.2 10*3/uL (ref 1.7–7.7)
Platelets: 227 10*3/uL (ref 150–400)
RBC: 4.95 MIL/uL (ref 4.22–5.81)
RDW: 14.4 % (ref 11.5–15.5)
WBC: 12.8 10*3/uL — ABNORMAL HIGH (ref 4.0–10.5)

## 2013-07-05 LAB — HEPATIC FUNCTION PANEL
ALBUMIN: 3.7 g/dL (ref 3.5–5.2)
ALT: 30 U/L (ref 0–53)
AST: 14 U/L (ref 0–37)
Alkaline Phosphatase: 159 U/L — ABNORMAL HIGH (ref 39–117)
Bilirubin, Direct: <0.2 mg/dL (ref 0.0–0.3)
Total Bilirubin: 0.5 mg/dL (ref 0.3–1.2)
Total Protein: 8 g/dL (ref 6.0–8.3)

## 2013-07-05 LAB — BASIC METABOLIC PANEL
BUN: 27 mg/dL — AB (ref 6–23)
CHLORIDE: 93 meq/L — AB (ref 96–112)
CO2: 20 meq/L (ref 19–32)
Calcium: 9.7 mg/dL (ref 8.4–10.5)
Creatinine, Ser: 1.27 mg/dL (ref 0.50–1.35)
GFR calc Af Amer: 83 mL/min — ABNORMAL LOW (ref >90)
GFR, EST NON AFRICAN AMERICAN: 71 mL/min — AB (ref >90)
Glucose, Bld: 328 mg/dL — ABNORMAL HIGH (ref 70–99)
POTASSIUM: 5.5 meq/L — AB (ref 3.7–5.3)
Sodium: 133 mEq/L — ABNORMAL LOW (ref 137–147)

## 2013-07-06 ENCOUNTER — Ambulatory Visit: Payer: Medicaid Other | Admitting: Gastroenterology

## 2013-07-06 NOTE — Pre-Procedure Instructions (Signed)
Dr Patsey Berthold notified of glucose 328. Patient called and instructed to take full dosage of Insulin the night before his surgery per order.

## 2013-07-08 NOTE — OR Nursing (Signed)
Potassium reported to Dr. Patsey Berthold and orderes in epic for istat on arrival on morning of surgery.

## 2013-07-11 ENCOUNTER — Encounter (HOSPITAL_COMMUNITY): Payer: Medicaid Other | Admitting: Anesthesiology

## 2013-07-11 ENCOUNTER — Ambulatory Visit (HOSPITAL_COMMUNITY): Payer: Medicaid Other | Admitting: Anesthesiology

## 2013-07-11 ENCOUNTER — Ambulatory Visit (HOSPITAL_COMMUNITY): Payer: Medicaid Other

## 2013-07-11 ENCOUNTER — Ambulatory Visit (HOSPITAL_COMMUNITY)
Admission: RE | Admit: 2013-07-11 | Discharge: 2013-07-11 | Disposition: A | Discharge status: Home / Self Care | Payer: Medicaid Other | Source: Ambulatory Visit | Attending: General Surgery | Admitting: General Surgery

## 2013-07-11 ENCOUNTER — Encounter (HOSPITAL_COMMUNITY): Payer: Self-pay | Admitting: *Deleted

## 2013-07-11 ENCOUNTER — Encounter (HOSPITAL_COMMUNITY)
Admission: RE | Disposition: A | Discharge status: Home / Self Care | Payer: Self-pay | Source: Ambulatory Visit | Attending: General Surgery

## 2013-07-11 DIAGNOSIS — E119 Type 2 diabetes mellitus without complications: Secondary | ICD-10-CM | POA: Insufficient documentation

## 2013-07-11 DIAGNOSIS — F17200 Nicotine dependence, unspecified, uncomplicated: Secondary | ICD-10-CM | POA: Insufficient documentation

## 2013-07-11 DIAGNOSIS — Z794 Long term (current) use of insulin: Secondary | ICD-10-CM | POA: Insufficient documentation

## 2013-07-11 DIAGNOSIS — K8010 Calculus of gallbladder with chronic cholecystitis without obstruction: Secondary | ICD-10-CM | POA: Insufficient documentation

## 2013-07-11 DIAGNOSIS — Z01812 Encounter for preprocedural laboratory examination: Secondary | ICD-10-CM | POA: Insufficient documentation

## 2013-07-11 HISTORY — PX: CHOLECYSTECTOMY: SHX55

## 2013-07-11 LAB — POCT I-STAT 4, (NA,K, GLUC, HGB,HCT)
Glucose, Bld: 274 mg/dL — ABNORMAL HIGH (ref 70–99)
HEMATOCRIT: 41 % (ref 39.0–52.0)
HEMOGLOBIN: 13.9 g/dL (ref 13.0–17.0)
Potassium: 4.3 mEq/L (ref 3.7–5.3)
SODIUM: 140 meq/L (ref 137–147)

## 2013-07-11 LAB — GLUCOSE, CAPILLARY: GLUCOSE-CAPILLARY: 236 mg/dL — AB (ref 70–99)

## 2013-07-11 SURGERY — LAPAROSCOPIC CHOLECYSTECTOMY
Anesthesia: General | Site: Abdomen

## 2013-07-11 MED ORDER — 0.9 % SODIUM CHLORIDE (POUR BTL) OPTIME
TOPICAL | Status: DC | PRN
  Administered 2013-07-11: 1000 mL

## 2013-07-11 MED ORDER — SUCCINYLCHOLINE CHLORIDE 20 MG/ML IJ SOLN
INTRAMUSCULAR | Status: AC
  Filled 2013-07-11: qty 1

## 2013-07-11 MED ORDER — GLYCOPYRROLATE 0.2 MG/ML IJ SOLN
INTRAMUSCULAR | Status: AC
  Filled 2013-07-11: qty 1

## 2013-07-11 MED ORDER — OXYCODONE-ACETAMINOPHEN 5-325 MG PO TABS: 1.0000 | ORAL_TABLET | ORAL | Status: DC | PRN

## 2013-07-11 MED ORDER — LACTATED RINGERS IV SOLN
INTRAVENOUS | Status: DC
  Administered 2013-07-11: 08:00:00 via INTRAVENOUS
  Administered 2013-07-11: 1000 mL via INTRAVENOUS

## 2013-07-11 MED ORDER — BUPIVACAINE HCL 0.5 % IJ SOLN
INTRAMUSCULAR | Status: DC | PRN
  Administered 2013-07-11: 10 mL

## 2013-07-11 MED ORDER — CLINDAMYCIN PHOSPHATE 900 MG/50ML IV SOLN
INTRAVENOUS | Status: AC
  Filled 2013-07-11: qty 50

## 2013-07-11 MED ORDER — LIDOCAINE HCL (PF) 1 % IJ SOLN
INTRAMUSCULAR | Status: AC
  Filled 2013-07-11: qty 5

## 2013-07-11 MED ORDER — FENTANYL CITRATE 0.05 MG/ML IJ SOLN
INTRAMUSCULAR | Status: DC | PRN
  Administered 2013-07-11 (×2): 50 ug via INTRAVENOUS
  Administered 2013-07-11: 100 ug via INTRAVENOUS
  Administered 2013-07-11 (×3): 50 ug via INTRAVENOUS

## 2013-07-11 MED ORDER — BUPIVACAINE HCL (PF) 0.5 % IJ SOLN
INTRAMUSCULAR | Status: AC
  Filled 2013-07-11: qty 30

## 2013-07-11 MED ORDER — ONDANSETRON HCL 4 MG/2ML IJ SOLN
INTRAMUSCULAR | Status: AC
  Filled 2013-07-11: qty 2

## 2013-07-11 MED ORDER — CLINDAMYCIN PHOSPHATE 900 MG/50ML IV SOLN
900.0000 mg | Freq: Once | INTRAVENOUS | Status: AC
  Administered 2013-07-11: 900 mg via INTRAVENOUS

## 2013-07-11 MED ORDER — ONDANSETRON HCL 4 MG/2ML IJ SOLN
4.0000 mg | Freq: Once | INTRAMUSCULAR | Status: AC
  Administered 2013-07-11: 4 mg via INTRAVENOUS

## 2013-07-11 MED ORDER — FENTANYL CITRATE 0.05 MG/ML IJ SOLN
INTRAMUSCULAR | Status: AC
  Filled 2013-07-11: qty 5

## 2013-07-11 MED ORDER — KETOROLAC TROMETHAMINE 30 MG/ML IJ SOLN
INTRAMUSCULAR | Status: AC
  Filled 2013-07-11: qty 1

## 2013-07-11 MED ORDER — CHLORHEXIDINE GLUCONATE 4 % EX LIQD: 1.0000 "application " | Freq: Once | CUTANEOUS | Status: DC

## 2013-07-11 MED ORDER — ROCURONIUM BROMIDE 50 MG/5ML IV SOLN
INTRAVENOUS | Status: AC
  Filled 2013-07-11: qty 1

## 2013-07-11 MED ORDER — PROPOFOL 10 MG/ML IV BOLUS
INTRAVENOUS | Status: DC | PRN
  Administered 2013-07-11: 200 mg via INTRAVENOUS

## 2013-07-11 MED ORDER — NEOSTIGMINE METHYLSULFATE 1 MG/ML IJ SOLN
INTRAMUSCULAR | Status: DC | PRN
  Administered 2013-07-11: 3.5 mg via INTRAVENOUS

## 2013-07-11 MED ORDER — FENTANYL CITRATE 0.05 MG/ML IJ SOLN
25.0000 ug | INTRAMUSCULAR | Status: DC | PRN
  Administered 2013-07-11 (×2): 25 ug via INTRAVENOUS

## 2013-07-11 MED ORDER — KETOROLAC TROMETHAMINE 30 MG/ML IJ SOLN
30.0000 mg | Freq: Once | INTRAMUSCULAR | Status: AC
  Administered 2013-07-11: 30 mg via INTRAVENOUS

## 2013-07-11 MED ORDER — SUCCINYLCHOLINE CHLORIDE 20 MG/ML IJ SOLN
INTRAMUSCULAR | Status: DC | PRN
  Administered 2013-07-11: 200 mg via INTRAVENOUS

## 2013-07-11 MED ORDER — PROPOFOL 10 MG/ML IV BOLUS
INTRAVENOUS | Status: AC
  Filled 2013-07-11: qty 20

## 2013-07-11 MED ORDER — ROCURONIUM BROMIDE 100 MG/10ML IV SOLN
INTRAVENOUS | Status: DC | PRN
  Administered 2013-07-11: 25 mg via INTRAVENOUS
  Administered 2013-07-11: 5 mg via INTRAVENOUS

## 2013-07-11 MED ORDER — MIDAZOLAM HCL 2 MG/2ML IJ SOLN
1.0000 mg | INTRAMUSCULAR | Status: DC | PRN
  Administered 2013-07-11: 2 mg via INTRAVENOUS

## 2013-07-11 MED ORDER — GLYCOPYRROLATE 0.2 MG/ML IJ SOLN
INTRAMUSCULAR | Status: DC | PRN
  Administered 2013-07-11: .6 mg via INTRAVENOUS

## 2013-07-11 MED ORDER — LIDOCAINE HCL 1 % IJ SOLN
INTRAMUSCULAR | Status: DC | PRN
  Administered 2013-07-11: 5 mg via INTRADERMAL

## 2013-07-11 MED ORDER — MIDAZOLAM HCL 2 MG/2ML IJ SOLN
INTRAMUSCULAR | Status: AC
  Filled 2013-07-11: qty 2

## 2013-07-11 MED ORDER — ONDANSETRON HCL 4 MG/2ML IJ SOLN: 4.0000 mg | Freq: Once | INTRAMUSCULAR | Status: DC | PRN

## 2013-07-11 MED ORDER — GLYCOPYRROLATE 0.2 MG/ML IJ SOLN
0.2000 mg | Freq: Once | INTRAMUSCULAR | Status: AC
  Administered 2013-07-11: 0.2 mg via INTRAVENOUS

## 2013-07-11 MED ORDER — GLYCOPYRROLATE 0.2 MG/ML IJ SOLN
INTRAMUSCULAR | Status: AC
  Filled 2013-07-11: qty 4

## 2013-07-11 MED ORDER — NEOSTIGMINE METHYLSULFATE 1 MG/ML IJ SOLN
INTRAMUSCULAR | Status: AC
  Filled 2013-07-11: qty 1

## 2013-07-11 SURGICAL SUPPLY — 53 items
APPLIER CLIP LAPSCP 10X32 DD (CLIP) ×4 IMPLANT
BAG HAMPER (MISCELLANEOUS) ×4 IMPLANT
BAG SPEC RTRVL LRG 6X4 10 (ENDOMECHANICALS) ×2
CATH CHOLANGIOGRAM 4.5FR (CATHETERS) ×4 IMPLANT
CLOTH BEACON ORANGE TIMEOUT ST (SAFETY) ×4 IMPLANT
COVER LIGHT HANDLE STERIS (MISCELLANEOUS) ×8 IMPLANT
COVER MAYO STAND XLG (DRAPE) ×4 IMPLANT
DECANTER SPIKE VIAL GLASS SM (MISCELLANEOUS) ×4 IMPLANT
DISSECTOR BLUNT TIP ENDO 5MM (MISCELLANEOUS) IMPLANT
DRAPE C-ARM FOLDED MOBILE STRL (DRAPES) ×4 IMPLANT
DURAPREP 26ML APPLICATOR (WOUND CARE) ×4 IMPLANT
ELECT REM PT RETURN 9FT ADLT (ELECTROSURGICAL) ×4
ELECTRODE REM PT RTRN 9FT ADLT (ELECTROSURGICAL) ×2 IMPLANT
FILTER SMOKE EVAC LAPAROSHD (FILTER) ×4 IMPLANT
FORMALIN 10 PREFIL 120ML (MISCELLANEOUS) ×4 IMPLANT
GLOVE BIO SURGEON STRL SZ7.5 (GLOVE) ×4 IMPLANT
GLOVE BIOGEL PI IND STRL 7.0 (GLOVE) ×2 IMPLANT
GLOVE BIOGEL PI IND STRL 8 (GLOVE) ×2 IMPLANT
GLOVE BIOGEL PI IND STRL 8.5 (GLOVE) ×1 IMPLANT
GLOVE BIOGEL PI INDICATOR 7.0 (GLOVE) ×4
GLOVE BIOGEL PI INDICATOR 8 (GLOVE) ×2
GLOVE BIOGEL PI INDICATOR 8.5 (GLOVE) ×2
GLOVE ECLIPSE 7.0 STRL STRAW (GLOVE) ×3 IMPLANT
GLOVE SS BIOGEL STRL SZ 6.5 (GLOVE) ×1 IMPLANT
GLOVE SUPERSENSE BIOGEL SZ 6.5 (GLOVE) ×2
GOWN STRL REUS W/TWL LRG LVL3 (GOWN DISPOSABLE) ×12 IMPLANT
HEMOSTAT SNOW SURGICEL 2X4 (HEMOSTASIS) ×4 IMPLANT
INST SET LAPROSCOPIC AP (KITS) ×4 IMPLANT
KIT ROOM TURNOVER APOR (KITS) ×4 IMPLANT
LIGASURE LAP ATLAS 10MM 37CM (INSTRUMENTS) ×3 IMPLANT
MANIFOLD NEPTUNE II (INSTRUMENTS) ×4 IMPLANT
NEEDLE INSUFFLATION 120MM (ENDOMECHANICALS) ×4 IMPLANT
NS IRRIG 1000ML POUR BTL (IV SOLUTION) ×4 IMPLANT
OMNIPAQUE 350MG/50ML ×147 IMPLANT
PACK LAP CHOLE LZT030E (CUSTOM PROCEDURE TRAY) ×4 IMPLANT
PAD ARMBOARD 7.5X6 YLW CONV (MISCELLANEOUS) ×4 IMPLANT
POUCH SPECIMEN RETRIEVAL 10MM (ENDOMECHANICALS) ×4 IMPLANT
SET BASIN LINEN APH (SET/KITS/TRAYS/PACK) ×4 IMPLANT
SET TUBE IRRIG SUCTION NO TIP (IRRIGATION / IRRIGATOR) IMPLANT
SLEEVE ENDOPATH XCEL 5M (ENDOMECHANICALS) ×4 IMPLANT
SPONGE GAUZE 2X2 8PLY STER LF (GAUZE/BANDAGES/DRESSINGS) ×4
SPONGE GAUZE 2X2 8PLY STRL LF (GAUZE/BANDAGES/DRESSINGS) ×12 IMPLANT
STAPLER VISISTAT (STAPLE) ×4 IMPLANT
SUT VICRYL 0 UR6 27IN ABS (SUTURE) ×4 IMPLANT
SYR 20CC LL (SYRINGE) ×4 IMPLANT
SYR 30ML LL (SYRINGE) ×4 IMPLANT
TAPE CLOTH SURG 4X10 WHT LF (GAUZE/BANDAGES/DRESSINGS) ×3 IMPLANT
TROCAR ENDO BLADELESS 11MM (ENDOMECHANICALS) ×4 IMPLANT
TROCAR XCEL NON-BLD 5MMX100MML (ENDOMECHANICALS) ×4 IMPLANT
TROCAR XCEL UNIV SLVE 11M 100M (ENDOMECHANICALS) ×4 IMPLANT
TUBING INSUFFLATION (TUBING) ×4 IMPLANT
WARMER LAPAROSCOPE (MISCELLANEOUS) ×4 IMPLANT
YANKAUER SUCT 12FT TUBE ARGYLE (SUCTIONS) ×4 IMPLANT

## 2013-07-11 NOTE — Interval H&P Note (Signed)
History and Physical Interval Note:  07/11/2013 8:08 AM  [name]  has presented today for surgery, with the diagnosis of cholelithiasis  The various methods of treatment have been discussed with the patient and family. After consideration of risks, benefits and other options for treatment, the patient has consented to  Procedure(s): LAPAROSCOPIC CHOLECYSTECTOMY WITH INTRAOPERATIVE CHOLANGIOGRAM (N/A) as a surgical intervention .  The patient's history has been reviewed, patient examined, no change in status, stable for surgery.  I have reviewed the patient's chart and labs.  Questions were answered to the patient's satisfaction.     Aviva Signs A

## 2013-07-11 NOTE — Interval H&P Note (Signed)
History and Physical Interval Note:  07/11/2013 8:09 AM  [name]  has presented today for surgery, with the diagnosis of cholelithiasis  The various methods of treatment have been discussed with the patient and family. After consideration of risks, benefits and other options for treatment, the patient has consented to  Procedure(s): LAPAROSCOPIC CHOLECYSTECTOMY WITH INTRAOPERATIVE CHOLANGIOGRAM (N/A) as a surgical intervention .  The patient's history has been reviewed, patient examined, no change in status, stable for surgery.  I have reviewed the patient's chart and labs.  Questions were answered to the patient's satisfaction.     Aviva Signs A

## 2013-07-11 NOTE — Transfer of Care (Signed)
Immediate Anesthesia Transfer of Care Note  Patient: [name]  Procedure(s) Performed: Procedure(s): LAPAROSCOPIC CHOLECYSTECTOMY (N/A)  Patient Location: PACU  Anesthesia Type:General  Level of Consciousness: awake  Airway & Oxygen Therapy: Patient Spontanous Breathing and Patient connected to face mask oxygen  Post-op Assessment: Report given to PACU RN  Post vital signs: Reviewed and stable  Complications: No apparent anesthesia complications

## 2013-07-11 NOTE — Anesthesia Postprocedure Evaluation (Signed)
  Anesthesia Post-op Note  Patient: [name]  Procedure(s) Performed: Procedure(s): LAPAROSCOPIC CHOLECYSTECTOMY (N/A)  Patient Location: PACU  Anesthesia Type:General  Level of Consciousness: awake and alert   Airway and Oxygen Therapy: Patient Spontanous Breathing and Patient connected to face mask oxygen  Post-op Pain: none  Post-op Assessment: Post-op Vital signs reviewed, Patient's Cardiovascular Status Stable, Respiratory Function Stable, Patent Airway and No signs of Nausea or vomiting  Post-op Vital Signs: Reviewed and stable  Complications: No apparent anesthesia complications

## 2013-07-11 NOTE — Anesthesia Preprocedure Evaluation (Signed)
Anesthesia Evaluation  Patient identified by MRN, date of birth, ID band Patient awake    Reviewed: Allergy & Precautions, H&P , NPO status , Patient's Chart, lab work & pertinent test results  Airway Mallampati: II TM Distance: >3 FB     Dental  (+) Teeth Intact   Pulmonary Current Smoker,  breath sounds clear to auscultation        Cardiovascular negative cardio ROS  Rhythm:Regular Rate:Normal     Neuro/Psych PSYCHIATRIC DISORDERS Bipolar Disorder    GI/Hepatic negative GI ROS, Pancratitis   Endo/Other  diabetes, Type 2, Oral Hypoglycemic Agents and Insulin Dependent  Renal/GU      Musculoskeletal   Abdominal   Peds  Hematology   Anesthesia Other Findings   Reproductive/Obstetrics                           Anesthesia Physical Anesthesia Plan  ASA: III  Anesthesia Plan: General   Post-op Pain Management:    Induction: Intravenous, Rapid sequence and Cricoid pressure planned  Airway Management Planned: Oral ETT  Additional Equipment:   Intra-op Plan:   Post-operative Plan: Extubation in OR  Informed Consent: I have reviewed the patients History and Physical, chart, labs and discussed the procedure including the risks, benefits and alternatives for the proposed anesthesia with the patient or authorized representative who has indicated his/her understanding and acceptance.     Plan Discussed with:   Anesthesia Plan Comments:         Anesthesia Quick Evaluation

## 2013-07-11 NOTE — Discharge Instructions (Signed)
Laparoscopic Cholecystectomy, Care After °Refer to this sheet in the next few weeks. These instructions provide you with information on caring for yourself after your procedure. Your health care provider may also give you more specific instructions. Your treatment has been planned according to current medical practices, but problems sometimes occur. Call your health care provider if you have any problems or questions after your procedure. °WHAT TO EXPECT AFTER THE PROCEDURE °After your procedure, it is typical to have the following: °· Pain at your incision sites. You will be given pain medicines to control the pain. °· Mild nausea or vomiting. This should improve after the first 24 hours. °· Bloating and possibly shoulder pain from the gas used during the procedure. This will improve after the first 24 hours. °HOME CARE INSTRUCTIONS  °· Change bandages (dressings) as directed by your health care provider. °· Keep the wound dry and clean. You may wash the wound gently with soap and water. Gently blot or dab the area dry. °· Do not take baths or use swimming pools or hot tubs for 2 weeks or until your health care provider approves. °· Only take over-the-counter or prescription medicines as directed by your health care provider. °· Continue your normal diet as directed by your health care provider. °· Do not lift anything heavier than 10 pounds (4.5 kg) until your health care provider approves. °· Do not play contact sports for 1 week or until your health care provider approves. °SEEK MEDICAL CARE IF:  °· You have redness, swelling, or increasing pain in the wound. °· You notice yellowish-white fluid (pus) coming from the wound. °· You have drainage from the wound that lasts longer than 1 day. °· You notice a bad smell coming from the wound or dressing. °· Your surgical cuts (incisions) break open. °SEEK IMMEDIATE MEDICAL CARE IF:  °· You develop a rash. °· You have difficulty breathing. °· You have chest pain. °· You  have a fever. °· You have increasing pain in the shoulders (shoulder strap areas). °· You have dizzy episodes or faint while standing. °· You have severe abdominal pain. °· You feel sick to your stomach (nauseous) or throw up (vomit) and this lasts for more than 1 day. °Document Released: 05/19/2005 Document Revised: 03/09/2013 Document Reviewed: 12/29/2012 °[employer]® Patient Information ©2014 [employer]. ° °

## 2013-07-11 NOTE — Op Note (Signed)
Patient:  [name]  DOB:  07/23/1975  MRN:  [id number]   Preop Diagnosis:  Cholecystitis, cholelithiasis, history of pancreatitis  Postop Diagnosis:  Same  Procedure:  Laparoscopic cholecystectomy  Surgeon:  Aviva Signs, M.D.  Anes:  General endotracheal  Indications:  Patient is a 38 year old black male who previously was diagnosed with gallstone pancreatitis. Ultrasound of gallbladder revealed cholelithiasis but a normal common bile duct. He now presents for laparoscopic cholecystectomy with possible intraoperative cholangiogram. The risks and benefits of the procedure including bleeding, infection, hepatobiliary injury, and the possibility of an open procedure were fully explained to the patient, who gave informed consent.  Procedure note:  The patient was placed in the supine position. After induction of general endotracheal anesthesia, the abdomen was prepped and draped using usual sterile technique with DuraPrep. Surgical site confirmation was performed.  A supraumbilical incision was made down to the fascia. A Veress needle was introduced into the abdominal cavity and confirmation of placement was done using the saline drop test. The abdomen was then insufflated to 16 mm mercury pressure. An 11 mm trocar was introduced into the abdominal cavity under direct visualization without difficulty. The patient was placed in reverse Trendelenburg position and additional 11 mm trocar was placed the epigastric region and 5 mm trochars were placed in the right upper quadrant and right flank regions. The liver was inspected and noted within normal limits. There were some adhesions of omentum to the intra-abdominal wall which were taken down using the LigaSure. This is probably from his previous open appendectomy. The gallbladder was then retracted in a dynamic fashion in order to expose the triangle of Calot. The cystic duct was first identified. Its junction to the infundibulum was fully  identified. The cystic duct was small in caliber and somewhat sclerotic. This precluded an attempt to do a cholangiogram. Endoclips were placed proximally and distally on the cystic duct, and the cystic duct was divided. This was likewise done the cystic artery. The gallbladder was then freed away from the gallbladder fossa using Bovie electrocautery. The gallbladder was delivered through the epigastric trocar site using an Endo Catch bag. The gallbladder fossa was inspected and no abnormal bleeding or bile leakage was noted. Surgicel is placed the gallbladder fossa. All fluid and air were then evacuated from the abdominal cavity prior to removal of the trochars.  All wounds were irrigated with normal saline. All wounds were injected with 0.5% Sensorcaine. The supraumbilical fascia was reapproximated using 0 Vicryl interrupted suture. All skin incisions were closed using staples. Betadine ointment and dry sterile dressings were applied.  All tape and needle counts were correct at the end of the procedure. Patient was extubated in the operating room and transferred to PACU in stable condition.  Complications:  None  EBL:  Minimal  Specimen:  Gallbladder

## 2013-07-11 NOTE — Anesthesia Procedure Notes (Signed)
Procedure Name: Intubation Date/Time: 07/11/2013 8:26 AM Performed by: Tressie Stalker E Pre-anesthesia Checklist: Patient identified, Patient being monitored, Timeout performed, Emergency Drugs available and Suction available Patient Re-evaluated:Patient Re-evaluated prior to inductionOxygen Delivery Method: Circle System Utilized Preoxygenation: Pre-oxygenation with 100% oxygen Intubation Type: IV induction, Rapid sequence and Cricoid Pressure applied Ventilation: Mask ventilation without difficulty Laryngoscope Size: Mac and 3 Grade View: Grade I Tube type: Oral Tube size: 7.0 mm Number of attempts: 1 Airway Equipment and Method: stylet Placement Confirmation: ETT inserted through vocal cords under direct vision,  positive ETCO2 and breath sounds checked- equal and bilateral Secured at: 23 cm Tube secured with: Tape Dental Injury: Teeth and Oropharynx as per pre-operative assessment

## 2013-07-14 ENCOUNTER — Encounter (HOSPITAL_COMMUNITY): Payer: Self-pay | Admitting: General Surgery

## 2014-02-10 ENCOUNTER — Ambulatory Visit: Payer: Medicaid Other | Admitting: Endocrinology

## 2014-02-17 ENCOUNTER — Ambulatory Visit (INDEPENDENT_AMBULATORY_CARE_PROVIDER_SITE_OTHER): Payer: Medicaid Other | Admitting: Endocrinology

## 2014-02-17 ENCOUNTER — Encounter: Payer: Self-pay | Admitting: Endocrinology

## 2014-02-17 VITALS — BP 112/66 | HR 94 | Temp 98.1°F | Ht 73.0 in | Wt 261.0 lb

## 2014-02-17 DIAGNOSIS — IMO0002 Reserved for concepts with insufficient information to code with codable children: Secondary | ICD-10-CM

## 2014-02-17 DIAGNOSIS — E1065 Type 1 diabetes mellitus with hyperglycemia: Secondary | ICD-10-CM

## 2014-02-17 MED ORDER — INSULIN GLARGINE 100 UNIT/ML SOLOSTAR PEN: 200.0000 [IU] | PEN_INJECTOR | SUBCUTANEOUS | Status: DC

## 2014-02-17 NOTE — Patient Instructions (Addendum)
good diet and exercise habits significanly improve the control of your diabetes.  please let me know if you wish to be referred to a dietician.  high blood sugar is very risky to your health.  you should see an eye doctor and dentist every year.  You are at higher than average risk for pneumonia and hepatitis-B.  You should be vaccinated against both.   controlling your blood pressure and cholesterol drastically reduces the damage diabetes does to your body.  this also applies to quitting smoking.  please discuss these with your doctor.  check your blood sugar 4 times a day: before the 3 meals, and at bedtime.  also check if you have symptoms of your blood sugar being too high or too low.  please keep a record of the readings and bring it to your next appointment here.  You can write it on any piece of paper.  please call us sooner if your blood sugar goes below 70, or if you have a lot of readings over 200. Please stop taking the novolog mix, and: Take lantus, 200 units each morning, and: Take novolog 5 units for any blood sugar in the 200's, and 10 whenever it is over 300.   Please come back for a follow-up appointment in 2 weeks.

## 2014-02-17 NOTE — Progress Notes (Signed)
Subjective:    Patient ID: [name], male    DOB: 01/18/1976, 38 y.o.   MRN: [id number]  HPI pt states DM was dx'ed in 2004, when he presented with DKA; he has had 3 more episodes of DKA since then--each with going off his insulin; he has mild if any neuropathy of the lower extremities; he is unaware of any associated chronic complications; he has been on insulin since dx; pt says his diet and exercise are "ok;"  he had gallstone pancreatitis in late 2014 and early 2015; he has never had severe hypoglycemia. He says he has mild hypoglycemia after he misses a meal, approx once a month.   Past Medical History  Diagnosis Date  . Diabetes mellitus without complication   . Bipolar disorder   . Hyperlipidemia   . Pancreatitis     Past Surgical History  Procedure Laterality Date  . Appendectomy    . Knee surgery Bilateral   . Cholecystectomy N/A 07/11/2013    Procedure: LAPAROSCOPIC CHOLECYSTECTOMY;  Surgeon: Jamesetta So, MD;  Location: [facility];  Service: General;  Laterality: N/A;    History   Social History  . Marital Status: Single    Spouse Name: N/A    Number of Children: 0  . Years of Education: N/A   Occupational History  . Not on file.   Social History Main Topics  . Smoking status: Current Every Day Smoker -- 0.50 packs/day for 10 years    Types: Cigarettes  . Smokeless tobacco: Not on file  . Alcohol Use: No  . Drug Use: No  . Sexual Activity: No   Other Topics Concern  . Not on file   Social History Narrative  . No narrative on file    Current Outpatient Prescriptions on File Prior to Visit  Medication Sig Dispense Refill  . divalproex (DEPAKOTE) 500 MG DR tablet Take 1,000 mg by mouth at bedtime.      . risperiDONE (RISPERDAL) 1 MG tablet Take 1 mg by mouth at bedtime.      . simvastatin (ZOCOR) 20 MG tablet Take 20 mg by mouth at bedtime.      . solifenacin (VESICARE) 10 MG tablet Take 10 mg by mouth daily.      . Testosterone (ANDROGEL PUMP  TD) Place 2 application onto the skin 2 (two) times daily.       No current facility-administered medications on file prior to visit.    No Known Allergies  Family History  Problem Relation Age of Onset  . Pancreatitis Neg Hx   . Colon cancer Neg Hx   . Diabetes Mother   . Diabetes Maternal Grandmother     BP 112/66  Pulse 94  Temp(Src) 98.1 F (36.7 C) (Oral)  Ht 6\' 1"  (1.854 m)  Wt 261 lb (118.389 kg)  BMI 34.44 kg/m2  SpO2 98%   Review of Systems denies weight loss, headache, chest pain, sob, n/v, urinary frequency, depression, cold intolerance, rhinorrhea, and easy bruising.  He has lost a few lbs.  He has blurry vision (will see opthal soon).  He has leg cramps and excessive diaphoresis.    Objective:   Physical Exam VS: see vs page GEN: no distress HEAD: head: no deformity eyes: no periorbital swelling, no proptosis external nose and ears are normal mouth: no lesion seen NECK: supple, thyroid is not enlarged CHEST WALL: no deformity LUNGS: clear to auscultation BREASTS:  No gynecomastia CV: reg rate and rhythm, no murmur  ABD: abdomen is soft, nontender.  no hepatosplenomegaly.  not distended.  no hernia MUSCULOSKELETAL: muscle bulk and strength are grossly normal.  no obvious joint swelling.  gait is normal and steady EXTEMITIES: no deformity.  no ulcer on the feet.  feet are of normal color and temp.  no edema PULSES: dorsalis pedis intact bilat.  no carotid bruit NEURO:  cn 2-12 grossly intact.   readily moves all 4's.  sensation is intact to touch on the feet SKIN:  Normal texture and temperature.  No rash or suspicious lesion is visible.   NODES:  None palpable at the neck PSYCH: alert, well-oriented.  Does not appear anxious nor depressed.   outside test results are reviewed: A1c=10.2%  Radiol: i reviewed [place] and CT abd reports from 2014.  i have reviewed the following outside records: 2014 and 2015 notes    Assessment & Plan:  DM: severe  exacerbation Bipolar disorder, new to me.  This limits rx of DM.  We discussed different insulin regimens, and he chooses qd lantus, and prn novolog. Pancreatitis: uncertain if this is related to DM   Patient is advised the following: Patient Instructions  good diet and exercise habits significanly improve the control of your diabetes.  please let me know if you wish to be referred to a dietician.  high blood sugar is very risky to your health.  you should see an eye doctor and dentist every year.  You are at higher than average risk for pneumonia and hepatitis-B.  You should be vaccinated against both.   controlling your blood pressure and cholesterol drastically reduces the damage diabetes does to your body.  this also applies to quitting smoking.  please discuss these with your doctor.  check your blood sugar 4 times a day: before the 3 meals, and at bedtime.  also check if you have symptoms of your blood sugar being too high or too low.  please keep a record of the readings and bring it to your next appointment here.  You can write it on any piece of paper.  please call us sooner if your blood sugar goes below 70, or if you have a lot of readings over 200. Please stop taking the novolog mix, and: Take lantus, 200 units each morning, and: Take novolog 5 units for any blood sugar in the 200's, and 10 whenever it is over 300.   Please come back for a follow-up appointment in 2 weeks.

## 2014-02-18 DIAGNOSIS — E1065 Type 1 diabetes mellitus with hyperglycemia: Secondary | ICD-10-CM | POA: Insufficient documentation

## 2014-02-18 DIAGNOSIS — IMO0002 Reserved for concepts with insufficient information to code with codable children: Secondary | ICD-10-CM | POA: Insufficient documentation

## 2014-03-03 ENCOUNTER — Ambulatory Visit: Payer: Medicaid Other | Admitting: Endocrinology

## 2014-03-10 ENCOUNTER — Encounter: Payer: Self-pay | Admitting: Endocrinology

## 2014-03-10 ENCOUNTER — Ambulatory Visit (INDEPENDENT_AMBULATORY_CARE_PROVIDER_SITE_OTHER): Payer: Medicaid Other | Admitting: Endocrinology

## 2014-03-10 VITALS — BP 130/82 | HR 107 | Temp 98.2°F | Ht 73.0 in | Wt 251.0 lb

## 2014-03-10 DIAGNOSIS — E1065 Type 1 diabetes mellitus with hyperglycemia: Secondary | ICD-10-CM

## 2014-03-10 DIAGNOSIS — IMO0002 Reserved for concepts with insufficient information to code with codable children: Secondary | ICD-10-CM

## 2014-03-10 NOTE — Progress Notes (Signed)
Subjective:    Patient ID: [name], male    DOB: [birth year], 38 y.o.   MRN: [id number]  HPI Pt returns for f/u of diabetes mellitus: DM type: 1 Dx'ed: 0109 Complications: none Therapy: insulin since dx.   DKA: he has had 3 more episodes since dx--each with going off his insulin Severe hypoglycemia: never Pancreatitis: he had gallstone pancreatitis in late 2014 Other: we discussed different insulin regimens, and he chooses qd lantus, and prn novolog Interval history:  History is from pt and his mother.  no cbg record, but states cbg's vary from 110-190.  It is in general higher as the day goes on.  He decreased the lantus to 150 units qd, due to hypoglycemia.  He says he is not having to take the novolog.  pt states he feels well in general. Past Medical History  Diagnosis Date  . Diabetes mellitus without complication   . Bipolar disorder   . Hyperlipidemia   . Pancreatitis     Past Surgical History  Procedure Laterality Date  . Appendectomy    . Knee surgery Bilateral   . Cholecystectomy N/A 07/11/2013    Procedure: LAPAROSCOPIC CHOLECYSTECTOMY;  Surgeon: Jamesetta So, MD;  Location: [facility];  Service: General;  Laterality: N/A;    History   Social History  . Marital Status: Single    Spouse Name: N/A    Number of Children: 0  . Years of Education: N/A   Occupational History  . Not on file.   Social History Main Topics  . Smoking status: Current Every Day Smoker -- 0.50 packs/day for 10 years    Types: Cigarettes  . Smokeless tobacco: Not on file  . Alcohol Use: No  . Drug Use: No  . Sexual Activity: No   Other Topics Concern  . Not on file   Social History Narrative  . No narrative on file    Current Outpatient Prescriptions on File Prior to Visit  Medication Sig Dispense Refill  . amoxicillin (AMOXIL) 500 MG capsule Take 500 mg by mouth 3 (three) times daily.      . divalproex (DEPAKOTE) 500 MG DR tablet Take 1,000 mg by mouth at bedtime.       . insulin aspart (NOVOLOG FLEXPEN) 100 UNIT/ML FlexPen Inject into the skin 3 (three) times daily with meals. Take up to 4 times a day: 5 units for any blood sugar in the 200's, and 10 units if in the 300's.      . risperiDONE (RISPERDAL) 1 MG tablet Take 1 mg by mouth at bedtime.      . simvastatin (ZOCOR) 20 MG tablet Take 20 mg by mouth at bedtime.      . solifenacin (VESICARE) 10 MG tablet Take 10 mg by mouth daily.      . Testosterone (ANDROGEL PUMP TD) Place 2 application onto the skin 2 (two) times daily.       No current facility-administered medications on file prior to visit.    No Known Allergies  Family History  Problem Relation Age of Onset  . Pancreatitis Neg Hx   . Colon cancer Neg Hx   . Diabetes Mother   . Diabetes Maternal Grandmother     BP 130/82  Pulse 107  Temp(Src) 98.2 F (36.8 C) (Oral)  Ht 6\' 1"  (1.854 m)  Wt 251 lb (113.853 kg)  BMI 33.12 kg/m2  SpO2 94%   Review of Systems He denies LOC.  He has weight gain.  Objective:   Physical Exam VITAL SIGNS:  See vs page.  GENERAL: no distress.  SKIN:  Insulin injection sites at the anterior abdomen are normal.         Assessment & Plan:  DM: moderate exacerbation Weight gain: new.  He is encouraged to re-lose  Patient is advised the following: Patient Instructions  check your blood sugar 4 times a day: before the 3 meals, and at bedtime.  also check if you have symptoms of your blood sugar being too high or too low.  please keep a record of the readings and bring it to your next appointment here.  You can write it on any piece of paper.  please call us sooner if your blood sugar goes below 70, or if you have a lot of readings over 200.   Please increase lantus to 160 units each morning, and:  Take novolog 5 units for any blood sugar in the 200's, and 10 whenever it is over 300.   Please come back for a follow-up appointment in 6 weeks.

## 2014-03-10 NOTE — Patient Instructions (Addendum)
check your blood sugar 4 times a day: before the 3 meals, and at bedtime.  also check if you have symptoms of your blood sugar being too high or too low.  please keep a record of the readings and bring it to your next appointment here.  You can write it on any piece of paper.  please call us sooner if your blood sugar goes below 70, or if you have a lot of readings over 200.   Please increase lantus to 160 units each morning, and:  Take novolog 5 units for any blood sugar in the 200's, and 10 whenever it is over 300.   Please come back for a follow-up appointment in 6 weeks.

## 2014-04-21 ENCOUNTER — Ambulatory Visit: Payer: Medicaid Other | Admitting: Endocrinology

## 2014-05-17 ENCOUNTER — Encounter: Payer: Self-pay | Admitting: Endocrinology

## 2014-06-05 ENCOUNTER — Ambulatory Visit (INDEPENDENT_AMBULATORY_CARE_PROVIDER_SITE_OTHER): Payer: Medicaid Other | Admitting: Endocrinology

## 2014-06-05 ENCOUNTER — Encounter: Payer: Self-pay | Admitting: Endocrinology

## 2014-06-05 VITALS — BP 136/92 | HR 92 | Temp 97.9°F | Ht 73.0 in | Wt 258.0 lb

## 2014-06-05 DIAGNOSIS — E1065 Type 1 diabetes mellitus with hyperglycemia: Secondary | ICD-10-CM

## 2014-06-05 DIAGNOSIS — IMO0002 Reserved for concepts with insufficient information to code with codable children: Secondary | ICD-10-CM

## 2014-06-05 NOTE — Progress Notes (Signed)
Subjective:    Patient ID: [name], male    DOB: [birth year], 39 y.o.   MRN: [id number]  HPI Pt returns for f/u of diabetes mellitus: DM type: 1 Dx'ed: 7829 Complications: none Therapy: insulin since dx.   DKA: he has had 3 more episodes since dx--each with going off his insulin Severe hypoglycemia: never Pancreatitis: he had gallstone pancreatitis in late 2014 Other: we discussed different insulin regimens, and he chooses qd lantus, and prn novolog.  He self-administers insulin.   Interval history: History is from pt and his mother.  no cbg record, but states cbg's vary from 180-200.  It is in general higher as the day goes on.  pt states he feels well in general.   Pt says he does not feel better despite testosterone injections. Past Medical History  Diagnosis Date  . Diabetes mellitus without complication   . Bipolar disorder   . Hyperlipidemia   . Pancreatitis     Past Surgical History  Procedure Laterality Date  . Appendectomy    . Knee surgery Bilateral   . Cholecystectomy N/A 07/11/2013    Procedure: LAPAROSCOPIC CHOLECYSTECTOMY;  Surgeon: Jamesetta So, MD;  Location: [facility];  Service: General;  Laterality: N/A;    History   Social History  . Marital Status: Single    Spouse Name: N/A    Number of Children: 0  . Years of Education: N/A   Occupational History  . Not on file.   Social History Main Topics  . Smoking status: Current Every Day Smoker -- 0.50 packs/day for 10 years    Types: Cigarettes  . Smokeless tobacco: Not on file  . Alcohol Use: No  . Drug Use: No  . Sexual Activity: No   Other Topics Concern  . Not on file   Social History Narrative    Current Outpatient Prescriptions on File Prior to Visit  Medication Sig Dispense Refill  . amoxicillin (AMOXIL) 500 MG capsule Take 500 mg by mouth 3 (three) times daily.    . divalproex (DEPAKOTE) 500 MG DR tablet Take 1,000 mg by mouth at bedtime.    . insulin aspart (NOVOLOG FLEXPEN)  100 UNIT/ML FlexPen Inject into the skin 3 (three) times daily with meals. Take up to 4 times a day: 5 units for any blood sugar in the 200's, and 10 units if in the 300's.    . Insulin Glargine (LANTUS) 100 UNIT/ML Solostar Pen Inject 160 Units into the skin every morning. And pen needles 5/day    . risperiDONE (RISPERDAL) 1 MG tablet Take 1 mg by mouth at bedtime.    . simvastatin (ZOCOR) 20 MG tablet Take 20 mg by mouth at bedtime.    . solifenacin (VESICARE) 10 MG tablet Take 10 mg by mouth daily.     No current facility-administered medications on file prior to visit.    No Known Allergies  Family History  Problem Relation Age of Onset  . Pancreatitis Neg Hx   . Colon cancer Neg Hx   . Diabetes Mother   . Diabetes Maternal Grandmother     BP 136/92 mmHg  Pulse 92  Temp(Src) 97.9 F (36.6 C) (Oral)  Ht 6\' 1"  (1.854 m)  Wt 258 lb (117.028 kg)  BMI 34.05 kg/m2  SpO2 95%  Review of Systems He denies hypoglycemia.  He has weight gain    Objective:   Physical Exam VITAL SIGNS:  See vs page GENERAL: no distress Pulses: dorsalis pedis intact  bilat.   MSK: no deformity of the feet CV: no leg edema Skin:  no ulcer on the feet.  normal color and temp on the feet. Neuro: sensation is intact to touch on the feet GENITALIA:Normal scrotum, and penis.  Testicles are slightly small and soft.      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   Patient is advised the following: Patient Instructions  check your blood sugar 4 times a day: before the 3 meals, and at bedtime.  also check if you have symptoms of your blood sugar being too high or too low.  please keep a record of the readings and bring it to your next appointment here.  You can write it on any piece of paper.  please call us sooner if your blood sugar goes below 70, or if you have a lot of readings over 200.   Take novolog 5 units  for any blood sugar in the 200's, and 10 whenever it is over 300.   Please come back for a follow-up appointment in 6 weeks.   blood tests are being requested for you today.  We'll let you know about the results. i'll let you know when i get the records from Dr Luan Pulling.

## 2014-06-05 NOTE — Patient Instructions (Addendum)
check your blood sugar 4 times a day: before the 3 meals, and at bedtime.  also check if you have symptoms of your blood sugar being too high or too low.  please keep a record of the readings and bring it to your next appointment here.  You can write it on any piece of paper.  please call us sooner if your blood sugar goes below 70, or if you have a lot of readings over 200.   Take novolog 5 units for any blood sugar in the 200's, and 10 whenever it is over 300.   Please come back for a follow-up appointment in 6 weeks.   blood tests are being requested for you today.  We'll let you know about the results. i'll let you know when i get the records from Dr Luan Pulling.

## 2014-06-07 LAB — MICROALBUMIN / CREATININE URINE RATIO
Creatinine, Urine: 206.9 mg/dL
Microalb Creat Ratio: 8.7 mg/g (ref 0.0–30.0)
Microalb, Ur: 1.8 mg/dL (ref <2.0)

## 2014-06-07 LAB — HEMOGLOBIN A1C
HEMOGLOBIN A1C: 8 % — AB (ref <5.7)
MEAN PLASMA GLUCOSE: 183 mg/dL — AB (ref <117)

## 2014-07-18 ENCOUNTER — Ambulatory Visit: Payer: Medicaid Other | Admitting: Endocrinology

## 2014-09-12 ENCOUNTER — Encounter: Payer: Self-pay | Admitting: Endocrinology

## 2014-09-12 ENCOUNTER — Ambulatory Visit (INDEPENDENT_AMBULATORY_CARE_PROVIDER_SITE_OTHER): Payer: Medicaid Other | Admitting: Endocrinology

## 2014-09-12 VITALS — BP 136/86 | HR 93 | Temp 97.8°F | Ht 73.0 in | Wt 264.0 lb

## 2014-09-12 DIAGNOSIS — E291 Testicular hypofunction: Secondary | ICD-10-CM | POA: Diagnosis not present

## 2014-09-12 DIAGNOSIS — E1065 Type 1 diabetes mellitus with hyperglycemia: Secondary | ICD-10-CM | POA: Diagnosis not present

## 2014-09-12 DIAGNOSIS — IMO0002 Reserved for concepts with insufficient information to code with codable children: Secondary | ICD-10-CM

## 2014-09-12 LAB — CBC WITH DIFFERENTIAL/PLATELET
BASOS ABS: 0 10*3/uL (ref 0.0–0.1)
Basophils Relative: 0.5 % (ref 0.0–3.0)
EOS ABS: 0.9 10*3/uL — AB (ref 0.0–0.7)
Eosinophils Relative: 9.6 % — ABNORMAL HIGH (ref 0.0–5.0)
HCT: 44.5 % (ref 39.0–52.0)
Hemoglobin: 15.4 g/dL (ref 13.0–17.0)
LYMPHS PCT: 33.8 % (ref 12.0–46.0)
Lymphs Abs: 3.2 10*3/uL (ref 0.7–4.0)
MCHC: 34.8 g/dL (ref 30.0–36.0)
MCV: 90.4 fl (ref 78.0–100.0)
MONOS PCT: 5.8 % (ref 3.0–12.0)
Monocytes Absolute: 0.6 10*3/uL (ref 0.1–1.0)
NEUTROS ABS: 4.8 10*3/uL (ref 1.4–7.7)
Neutrophils Relative %: 50.3 % (ref 43.0–77.0)
Platelets: 198 10*3/uL (ref 150.0–400.0)
RBC: 4.92 Mil/uL (ref 4.22–5.81)
RDW: 15.1 % (ref 11.5–15.5)
WBC: 9.6 10*3/uL (ref 4.0–10.5)

## 2014-09-12 LAB — HEMOGLOBIN A1C: Hgb A1c MFr Bld: 8.4 % — ABNORMAL HIGH (ref 4.6–6.5)

## 2014-09-12 LAB — TESTOSTERONE: TESTOSTERONE: 252.62 ng/dL — AB (ref 300.00–890.00)

## 2014-09-12 NOTE — Progress Notes (Signed)
Subjective:    Patient ID: [name], male    DOB: [birth year], 39 y.o.   MRN: [id number]  HPI Pt returns for f/u of diabetes mellitus: DM type: 1 Dx'ed: 5102 Complications: none Therapy: insulin since dx.   DKA: he has had 3 more episodes since dx--each with going off his insulin Severe hypoglycemia: never Pancreatitis: he had gallstone pancreatitis in late 2014 Other: we discussed different insulin regimens, and he chooses qd lantus, and prn novolog.  He self-administers insulin.   Interval history: History is from pt and his mother.  no cbg record, but states cbg's vary from 110-300.  It is in general higher as the day goes on. He has received testosterone injections every 2 weeks, at Dr Luan Pulling' office, since 2014.  Last dose was 08/31/14.  He says ED sxs persist despite this medication.   Past Medical History  Diagnosis Date  . Diabetes mellitus without complication   . Bipolar disorder   . Hyperlipidemia   . Pancreatitis     Past Surgical History  Procedure Laterality Date  . Appendectomy    . Knee surgery Bilateral   . Cholecystectomy N/A 07/11/2013    Procedure: LAPAROSCOPIC CHOLECYSTECTOMY;  Surgeon: Jamesetta So, MD;  Location: [facility];  Service: General;  Laterality: N/A;    History   Social History  . Marital Status: Single    Spouse Name: N/A  . Number of Children: 0  . Years of Education: N/A   Occupational History  . Not on file.   Social History Main Topics  . Smoking status: Current Every Day Smoker -- 0.50 packs/day for 10 years    Types: Cigarettes  . Smokeless tobacco: Not on file  . Alcohol Use: No  . Drug Use: No  . Sexual Activity: No   Other Topics Concern  . Not on file   Social History Narrative    Current Outpatient Prescriptions on File Prior to Visit  Medication Sig Dispense Refill  . amoxicillin (AMOXIL) 500 MG capsule Take 500 mg by mouth 3 (three) times daily.    . divalproex (DEPAKOTE) 500 MG DR tablet Take 1,000 mg  by mouth at bedtime.    . insulin aspart (NOVOLOG FLEXPEN) 100 UNIT/ML FlexPen Inject 30 Units into the skin daily with supper.     . Insulin Glargine (LANTUS) 100 UNIT/ML Solostar Pen Inject 140 Units into the skin every morning. And pen needles 5/day    . risperiDONE (RISPERDAL) 1 MG tablet Take 1 mg by mouth at bedtime.    . simvastatin (ZOCOR) 20 MG tablet Take 20 mg by mouth at bedtime.    . solifenacin (VESICARE) 10 MG tablet Take 10 mg by mouth daily.     No current facility-administered medications on file prior to visit.    No Known Allergies  Family History  Problem Relation Age of Onset  . Pancreatitis Neg Hx   . Colon cancer Neg Hx   . Diabetes Mother   . Diabetes Maternal Grandmother     BP 136/86 mmHg  Pulse 93  Temp(Src) 97.8 F (36.6 C) (Oral)  Ht 6\' 1"  (1.854 m)  Wt 264 lb (119.75 kg)  BMI 34.84 kg/m2  SpO2 97%  Review of Systems He denies hypoglycemia.  He has gained a few lbs.      Objective:   Physical Exam VITAL SIGNS:  See vs page GENERAL: no distress Pulses: dorsalis pedis intact bilat.   MSK: no deformity of the feet CV:  no leg edema Skin:  no ulcer on the feet.  normal color and temp on the feet. Neuro: sensation is intact to touch on the feet  Lab Results  Component Value Date   HGBA1C 8.4* 09/12/2014   Lab Results  Component Value Date   TESTOSTERONE 252.62* 09/12/2014       Assessment & Plan:  Hypogonadism: uncertain etiology (he would need to d/c supplement in order to w/u).  His testosterone level is low, but he is on maximum dosage.  i have nothing further to offer, unless he is willing to go off injections for a period of months for w/u.  DM: worse.   Patient is advised the following: Patient Instructions  check your blood sugar 4 times a day: before the 3 meals, and at bedtime.  also check if you have symptoms of your blood sugar being too high or too low.  please keep a record of the readings and bring it to your next  appointment here.  You can write it on any piece of paper.  please call us sooner if your blood sugar goes below 70, or if you have a lot of readings over 200.   Please reduce the lantus to 140 units daily, and:  Take novolog 30 units with the evening meal.   Please come back for a follow-up appointment in 6 weeks.   blood tests are being requested for you today.  We'll let you know about the results.

## 2014-09-12 NOTE — Patient Instructions (Addendum)
check your blood sugar 4 times a day: before the 3 meals, and at bedtime.  also check if you have symptoms of your blood sugar being too high or too low.  please keep a record of the readings and bring it to your next appointment here.  You can write it on any piece of paper.  please call us sooner if your blood sugar goes below 70, or if you have a lot of readings over 200.   Please reduce the lantus to 140 units daily, and:  Take novolog 30 units with the evening meal.   Please come back for a follow-up appointment in 6 weeks.   blood tests are being requested for you today.  We'll let you know about the results.

## 2014-09-13 LAB — PROLACTIN: Prolactin: 12 ng/mL (ref 2.1–17.1)

## 2014-10-24 ENCOUNTER — Ambulatory Visit: Payer: Medicaid Other | Admitting: Endocrinology

## 2014-11-28 ENCOUNTER — Ambulatory Visit: Payer: Medicaid Other | Admitting: Endocrinology

## 2014-12-15 ENCOUNTER — Ambulatory Visit: Payer: Medicaid Other | Admitting: Endocrinology

## 2015-01-10 ENCOUNTER — Ambulatory Visit: Payer: Medicaid Other | Admitting: Endocrinology

## 2015-03-01 ENCOUNTER — Other Ambulatory Visit: Payer: Self-pay | Admitting: Endocrinology

## 2015-10-09 ENCOUNTER — Ambulatory Visit: Payer: Medicaid Other | Admitting: Endocrinology

## 2015-10-17 ENCOUNTER — Encounter: Payer: Self-pay | Admitting: Endocrinology

## 2015-10-17 ENCOUNTER — Ambulatory Visit (INDEPENDENT_AMBULATORY_CARE_PROVIDER_SITE_OTHER): Payer: Medicaid Other | Admitting: Endocrinology

## 2015-10-17 VITALS — BP 126/84 | HR 108 | Temp 98.5°F | Wt 224.0 lb

## 2015-10-17 DIAGNOSIS — E109 Type 1 diabetes mellitus without complications: Secondary | ICD-10-CM | POA: Diagnosis not present

## 2015-10-17 DIAGNOSIS — E1065 Type 1 diabetes mellitus with hyperglycemia: Principal | ICD-10-CM

## 2015-10-17 DIAGNOSIS — IMO0001 Reserved for inherently not codable concepts without codable children: Secondary | ICD-10-CM

## 2015-10-17 LAB — POCT GLYCOSYLATED HEMOGLOBIN (HGB A1C): HEMOGLOBIN A1C: 9.7

## 2015-10-17 NOTE — Patient Instructions (Addendum)
check your blood sugar 4 times a day: before the 3 meals, and at bedtime.  also check if you have symptoms of your blood sugar being too high or too low.  please keep a record of the readings and bring it to your next appointment here.  You can write it on any piece of paper.  please call us sooner if your blood sugar goes below 70, or if you have a lot of readings over 200.   Please increase the lantus to 100 units each morning, and:  continue the same novolog: 30 units with the evening meal.   Please come back for a follow-up appointment in 2 months.

## 2015-10-17 NOTE — Progress Notes (Signed)
Subjective:    Patient ID: [name], male    DOB: 1976-02-25, 40 y.o.   MRN: [id number]  HPI Pt returns for f/u of diabetes mellitus: DM type: 1 Dx'ed: 123XX123 Complications: none Therapy: insulin since dx.   DKA: he has had 3 more episodes since dx--each with going off his insulin.  Severe hypoglycemia: never Pancreatitis: he had gallstone pancreatitis in late 2014. Other: we discussed different insulin regimens, and he chooses qd lantus, and prn novolog.  He self-administers insulin.   Interval history: pt has reduced insulin, due to weight loss.  He now takes lantus 80/d, and novolog 30/d.  no cbg record, but states cbg's are in the 100's at hs   Hypogonadism: He has received testosterone injections every 2 weeks, at Dr Luan Pulling' office, since 2014. Last dose of testosterone was last week.   Past Medical History  Diagnosis Date  . Diabetes mellitus without complication ([place])   . Bipolar disorder ([place])   . Hyperlipidemia   . Pancreatitis     Past Surgical History  Procedure Laterality Date  . Appendectomy    . Knee surgery Bilateral   . Cholecystectomy N/A 07/11/2013    Procedure: LAPAROSCOPIC CHOLECYSTECTOMY;  Surgeon: Jamesetta So, MD;  Location: [facility];  Service: General;  Laterality: N/A;    Social History   Social History  . Marital Status: Single    Spouse Name: N/A  . Number of Children: 0  . Years of Education: N/A   Occupational History  . Not on file.   Social History Main Topics  . Smoking status: Current Every Day Smoker -- 0.50 packs/day for 10 years    Types: Cigarettes  . Smokeless tobacco: Not on file  . Alcohol Use: No  . Drug Use: No  . Sexual Activity: No   Other Topics Concern  . Not on file   Social History Narrative    Current Outpatient Prescriptions on File Prior to Visit  Medication Sig Dispense Refill  . amoxicillin (AMOXIL) 500 MG capsule Take 500 mg by mouth 3 (three) times daily.    . divalproex (DEPAKOTE) 500 MG DR  tablet Take 1,000 mg by mouth at bedtime.    . insulin aspart (NOVOLOG FLEXPEN) 100 UNIT/ML FlexPen Inject 30 Units into the skin daily with supper.     . risperiDONE (RISPERDAL) 1 MG tablet Take 1 mg by mouth at bedtime.    . simvastatin (ZOCOR) 20 MG tablet Take 20 mg by mouth at bedtime.    . solifenacin (VESICARE) 10 MG tablet Take 10 mg by mouth daily.     No current facility-administered medications on file prior to visit.    No Known Allergies  Family History  Problem Relation Age of Onset  . Pancreatitis Neg Hx   . Colon cancer Neg Hx   . Diabetes Mother   . Diabetes Maternal Grandmother     BP 126/84 mmHg  Pulse 108  Temp(Src) 98.5 F (36.9 C) (Oral)  Wt 224 lb (101.606 kg)  SpO2 96%   Review of Systems He has lost a lot of weight over the past year.      Objective:   Physical Exam VITAL SIGNS:  See vs page GENERAL: no distress Pulses: dorsalis pedis intact bilat.   MSK: no deformity of the feet CV: no leg edema Skin:  no ulcer on the feet, but there are heavy calluses.  normal color and temp on the feet. Neuro: sensation is intact to touch  on the feet.    Lab Results  Component Value Date   HGBA1C 9.7 10/17/2015       Assessment & Plan:  DM: worse.   Noncompliance with cbg recording and f/u ov's. Hypogonadism: pt says he'll stop the testosterone injections, and we'll recheck labs upon return.  Patient is advised the following: Patient Instructions  check your blood sugar 4 times a day: before the 3 meals, and at bedtime.  also check if you have symptoms of your blood sugar being too high or too low.  please keep a record of the readings and bring it to your next appointment here.  You can write it on any piece of paper.  please call us sooner if your blood sugar goes below 70, or if you have a lot of readings over 200.   Please increase the lantus to 100 units each morning, and:  continue the same novolog: 30 units with the evening meal.   Please come  back for a follow-up appointment in 2 months.

## 2015-12-17 ENCOUNTER — Ambulatory Visit: Payer: Medicaid Other | Admitting: Endocrinology

## 2016-01-24 ENCOUNTER — Ambulatory Visit (INDEPENDENT_AMBULATORY_CARE_PROVIDER_SITE_OTHER): Payer: Medicaid Other | Admitting: Endocrinology

## 2016-01-24 ENCOUNTER — Encounter: Payer: Self-pay | Admitting: Endocrinology

## 2016-01-24 VITALS — BP 118/86 | HR 94 | Ht 73.0 in | Wt 231.0 lb

## 2016-01-24 DIAGNOSIS — IMO0001 Reserved for inherently not codable concepts without codable children: Secondary | ICD-10-CM

## 2016-01-24 DIAGNOSIS — E109 Type 1 diabetes mellitus without complications: Secondary | ICD-10-CM | POA: Diagnosis not present

## 2016-01-24 DIAGNOSIS — E1065 Type 1 diabetes mellitus with hyperglycemia: Principal | ICD-10-CM

## 2016-01-24 LAB — POCT GLYCOSYLATED HEMOGLOBIN (HGB A1C): Hemoglobin A1C: 8.4

## 2016-01-24 MED ORDER — INSULIN ASPART 100 UNIT/ML FLEXPEN: 30.0000 [IU] | PEN_INJECTOR | Freq: Two times a day (BID) | SUBCUTANEOUS | 11 refills | Status: DC

## 2016-01-24 NOTE — Patient Instructions (Addendum)
check your blood sugar 4 times a day: before the 3 meals, and at bedtime.  also check if you have symptoms of your blood sugar being too high or too low.  please keep a record of the readings and bring it to your next appointment here.  You can write it on any piece of paper.  please call us sooner if your blood sugar goes below 70, or if you have a lot of readings over 200.   Please continue the lantus,100 units each morning, and:  take novolog: 30 units twice daily: with breakfast, and with the evening meal.   Please come back for a follow-up appointment in 3 months.

## 2016-01-24 NOTE — Progress Notes (Signed)
Subjective:    Patient ID: [name], male    DOB: June 21, 1975, 40 y.o.   MRN: [id number]  HPI Pt returns for f/u of diabetes mellitus: DM type: 1 Dx'ed: 123XX123 Complications: none Therapy: insulin since dx.   DKA: he has had 3 more episodes since dx--each with going off his insulin.  Severe hypoglycemia: never Pancreatitis: he had gallstone pancreatitis in late 2014. Other: we discussed different insulin regimens, and he chooses qd lantus, and prn novolog.  He self-administers insulin.   Interval history: He now takes lantus 100/d, and novolog 30 units qd with breakfast.  He then takes 10 more units at hs, if cbg is high then.  no cbg record, but states cbg's are in the 100's at hs.  He says his diet has been good recently.  He says he never misses the insulin.   Past Medical History:  Diagnosis Date  . Bipolar disorder ([place])   . Diabetes mellitus without complication ([place])   . Hyperlipidemia   . Pancreatitis     Past Surgical History:  Procedure Laterality Date  . APPENDECTOMY    . CHOLECYSTECTOMY N/A 07/11/2013   Procedure: LAPAROSCOPIC CHOLECYSTECTOMY;  Surgeon: Jamesetta So, MD;  Location: [facility];  Service: General;  Laterality: N/A;  . KNEE SURGERY Bilateral     Social History   Social History  . Marital status: Single    Spouse name: N/A  . Number of children: 0  . Years of education: N/A   Occupational History  . Not on file.   Social History Main Topics  . Smoking status: Current Every Day Smoker    Packs/day: 0.50    Years: 10.00    Types: Cigarettes  . Smokeless tobacco: Not on file  . Alcohol use No  . Drug use: No  . Sexual activity: No   Other Topics Concern  . Not on file   Social History Narrative  . No narrative on file    Current Outpatient Prescriptions on File Prior to Visit  Medication Sig Dispense Refill  . divalproex (DEPAKOTE) 500 MG DR tablet Take 1,000 mg by mouth at bedtime.    . Insulin Glargine (LANTUS) 100 UNIT/ML  Solostar Pen Inject 100 Units into the skin every morning.    . risperiDONE (RISPERDAL) 1 MG tablet Take 1 mg by mouth at bedtime.    . simvastatin (ZOCOR) 20 MG tablet Take 20 mg by mouth at bedtime.    . solifenacin (VESICARE) 10 MG tablet Take 10 mg by mouth daily.     No current facility-administered medications on file prior to visit.     No Known Allergies  Family History  Problem Relation Age of Onset  . Pancreatitis Neg Hx   . Colon cancer Neg Hx   . Diabetes Mother   . Diabetes Maternal Grandmother     BP 118/86   Pulse 94   Ht 6\' 1"  (1.854 m)   Wt 231 lb (104.8 kg)   BMI 30.48 kg/m   Review of Systems He denies hypoglycemia.     Objective:   Physical Exam VITAL SIGNS:  See vs page GENERAL: no distress Pulses: dorsalis pedis intact bilat.   MSK: no deformity of the feet.  CV: no leg edema.  Skin:  no ulcer on the feet.  normal color and temp on the feet.   Neuro: sensation is intact to touch on the feet.    A1c=8.4%    Assessment & Plan:  Type  1 DM: he needs increased rx.

## 2016-04-27 NOTE — Progress Notes (Deleted)
   Subjective:    Patient ID: [name], male    DOB: 02-27-1976, 40 y.o.   MRN: [id number]  HPI Pt returns for f/u of diabetes mellitus: DM type: 1 Dx'ed: 123XX123 Complications: none Therapy: insulin since dx.   DKA: he has had 3 more episodes since dx--each with going off his insulin.  Severe hypoglycemia: never Pancreatitis: he had gallstone pancreatitis in late 2014. Other: we discussed different insulin regimens, and he chooses qd lantus, and prn novolog.  He self-administers insulin.   Interval history: He now takes lantus 100/d, and novolog 30 units qd with breakfast.  He then takes 10 more units at hs, if cbg is high then.  no cbg record, but states cbg's are in the 100's at hs.  He says his diet has been good recently.  He says he never misses the insulin.   Review of Systems     Objective:   Physical Exam VITAL SIGNS:  See vs page GENERAL: no distress Pulses: dorsalis pedis intact bilat.   MSK: no deformity of the feet.  CV: no leg edema.  Skin:  no ulcer on the feet.  normal color and temp on the feet.   Neuro: sensation is intact to touch on the feet.        Assessment & Plan:  Type 1 DM: he needs increased rx.

## 2016-04-28 ENCOUNTER — Ambulatory Visit: Payer: Medicaid Other | Admitting: Endocrinology

## 2016-04-28 DIAGNOSIS — Z0289 Encounter for other administrative examinations: Secondary | ICD-10-CM

## 2016-10-13 ENCOUNTER — Encounter: Payer: Self-pay | Admitting: Endocrinology

## 2016-10-13 ENCOUNTER — Ambulatory Visit (INDEPENDENT_AMBULATORY_CARE_PROVIDER_SITE_OTHER): Payer: Medicaid Other | Admitting: Endocrinology

## 2016-10-13 VITALS — BP 122/82 | HR 96 | Ht 73.0 in | Wt 223.0 lb

## 2016-10-13 DIAGNOSIS — E1065 Type 1 diabetes mellitus with hyperglycemia: Secondary | ICD-10-CM

## 2016-10-13 DIAGNOSIS — IMO0001 Reserved for inherently not codable concepts without codable children: Secondary | ICD-10-CM

## 2016-10-13 DIAGNOSIS — E291 Testicular hypofunction: Secondary | ICD-10-CM

## 2016-10-13 LAB — IBC PANEL
Iron: 58 ug/dL (ref 42–165)
Saturation Ratios: 15 % — ABNORMAL LOW (ref 20.0–50.0)
TRANSFERRIN: 277 mg/dL (ref 212.0–360.0)

## 2016-10-13 LAB — LUTEINIZING HORMONE: LH: 2.18 m[IU]/mL (ref 1.50–9.30)

## 2016-10-13 LAB — FOLLICLE STIMULATING HORMONE: FSH: 2.5 m[IU]/mL (ref 1.4–18.1)

## 2016-10-13 LAB — TSH: TSH: 1.78 u[IU]/mL (ref 0.35–4.50)

## 2016-10-13 LAB — POCT GLYCOSYLATED HEMOGLOBIN (HGB A1C): HEMOGLOBIN A1C: 11.4

## 2016-10-13 MED ORDER — INSULIN GLARGINE 100 UNIT/ML SOLOSTAR PEN: 130.0000 [IU] | PEN_INJECTOR | SUBCUTANEOUS | 11 refills | Status: DC

## 2016-10-13 NOTE — Patient Instructions (Addendum)
check your blood sugar 4 times a day: before the 3 meals, and at bedtime.  also check if you have symptoms of your blood sugar being too high or too low.  please keep a record of the readings and bring it to your next appointment here.  You can write it on any piece of paper.  please call us sooner if your blood sugar goes below 70, or if you have a lot of readings over 200.   Please increase the lantus to 130 units each morning, and:  continue novolog: 30 units twice daily: with breakfast, and with the evening meal.  blood tests are requested for you today.  We'll let you know about the results.   Please come back for a follow-up appointment in 2 months.

## 2016-10-13 NOTE — Progress Notes (Signed)
Subjective:    Patient ID: [name], male    DOB: [birth year], 41 y.o.   MRN: [id number]  HPI Pt returns for f/u of diabetes mellitus: DM type: 1 Dx'ed: 7829 Complications: none Therapy: insulin since dx.   DKA: he has had 3 more episodes since dx--each with going off his insulin.  Severe hypoglycemia: never Pancreatitis: he had gallstone pancreatitis in late 2014.  Other: we discussed different insulin regimens, and he chooses qd lantus, and prn novolog.  He self-administers insulin.   Interval history: He says he never misses the insulin.  pt states he feels well in general.  Specifically, he denies n/v/sob.  He has been off testosterone injections x a few months.   Past Medical History:  Diagnosis Date  . Bipolar disorder ([place])   . Diabetes mellitus without complication ([place])   . Hyperlipidemia   . Pancreatitis     Past Surgical History:  Procedure Laterality Date  . APPENDECTOMY    . CHOLECYSTECTOMY N/A 07/11/2013   Procedure: LAPAROSCOPIC CHOLECYSTECTOMY;  Surgeon: Jamesetta So, MD;  Location: [facility];  Service: General;  Laterality: N/A;  . KNEE SURGERY Bilateral     Social History   Social History  . Marital status: Single    Spouse name: N/A  . Number of children: 0  . Years of education: N/A   Occupational History  . Not on file.   Social History Main Topics  . Smoking status: Current Every Day Smoker    Packs/day: 0.50    Years: 10.00    Types: Cigarettes  . Smokeless tobacco: Never Used  . Alcohol use No  . Drug use: No  . Sexual activity: No   Other Topics Concern  . Not on file   Social History Narrative  . No narrative on file    Current Outpatient Prescriptions on File Prior to Visit  Medication Sig Dispense Refill  . divalproex (DEPAKOTE) 500 MG DR tablet Take 1,000 mg by mouth at bedtime.    . insulin aspart (NOVOLOG FLEXPEN) 100 UNIT/ML FlexPen Inject 30 Units into the skin 2 (two) times daily with a meal. 15 mL 11  .  risperiDONE (RISPERDAL) 1 MG tablet Take 1 mg by mouth at bedtime.    . simvastatin (ZOCOR) 20 MG tablet Take 20 mg by mouth at bedtime.    . solifenacin (VESICARE) 10 MG tablet Take 10 mg by mouth daily.     No current facility-administered medications on file prior to visit.     No Known Allergies  Family History  Problem Relation Age of Onset  . Pancreatitis Neg Hx   . Colon cancer Neg Hx   . Diabetes Mother   . Diabetes Maternal Grandmother     BP 122/82   Pulse 96   Ht 6\' 1"  (1.854 m)   Wt 223 lb (101.2 kg)   SpO2 96%   BMI 29.42 kg/m    Review of Systems He denies hypoglycemia.      Objective:   Physical Exam VITAL SIGNS:  See vs page GENERAL: no distress Pulses: dorsalis pedis intact bilat.   MSK: no deformity of the feet CV: no leg edema Skin:  no ulcer on the feet.  normal color and temp on the feet. Neuro: sensation is intact to touch on the feet   A1c=11.4%    Assessment & Plan:  Insulin-requiring type 2 DM: worse Hypogonadism, uncertain etiology.    Patient Instructions  check your blood sugar 4  times a day: before the 3 meals, and at bedtime.  also check if you have symptoms of your blood sugar being too high or too low.  please keep a record of the readings and bring it to your next appointment here.  You can write it on any piece of paper.  please call us sooner if your blood sugar goes below 70, or if you have a lot of readings over 200.   Please increase the lantus to 130 units each morning, and:  continue novolog: 30 units twice daily: with breakfast, and with the evening meal.  blood tests are requested for you today.  We'll let you know about the results.   Please come back for a follow-up appointment in 2 months.

## 2016-10-14 LAB — PROLACTIN: Prolactin: 8.3 ng/mL (ref 2.0–18.0)

## 2016-10-15 LAB — TESTOSTERONE,FREE AND TOTAL
TESTOSTERONE: 158 ng/dL — AB (ref 264–916)
Testosterone, Free: 9.6 pg/mL (ref 8.7–25.1)

## 2016-12-15 ENCOUNTER — Ambulatory Visit: Payer: Medicaid Other | Admitting: Endocrinology

## 2016-12-26 ENCOUNTER — Encounter: Payer: Self-pay | Admitting: Endocrinology

## 2016-12-26 ENCOUNTER — Ambulatory Visit (INDEPENDENT_AMBULATORY_CARE_PROVIDER_SITE_OTHER): Payer: Medicaid Other | Admitting: Endocrinology

## 2016-12-26 VITALS — BP 118/78 | HR 112 | Ht 73.0 in | Wt 231.0 lb

## 2016-12-26 DIAGNOSIS — E1065 Type 1 diabetes mellitus with hyperglycemia: Secondary | ICD-10-CM | POA: Diagnosis not present

## 2016-12-26 DIAGNOSIS — IMO0001 Reserved for inherently not codable concepts without codable children: Secondary | ICD-10-CM

## 2016-12-26 LAB — POCT GLYCOSYLATED HEMOGLOBIN (HGB A1C): Hemoglobin A1C: 7.6

## 2016-12-26 MED ORDER — INSULIN ASPART 100 UNIT/ML FLEXPEN
35.0000 [IU] | PEN_INJECTOR | Freq: Two times a day (BID) | SUBCUTANEOUS | 11 refills | Status: DC
Start: 2016-12-26 — End: 2017-04-08

## 2016-12-26 MED ORDER — SILDENAFIL CITRATE 20 MG PO TABS: 20.0000 mg | ORAL_TABLET | Freq: Every day | ORAL | 3 refills | Status: DC | PRN

## 2016-12-26 NOTE — Patient Instructions (Addendum)
check your blood sugar 4 times a day: before the 3 meals, and at bedtime.  also check if you have symptoms of your blood sugar being too high or too low.  please keep a record of the readings and bring it to your next appointment here.  You can write it on any piece of paper.  please call us sooner if your blood sugar goes below 70, or if you have a lot of readings over 200.   Please continue the same lantus: 130 units each morning, and:  Increase the novolog to 35 units twice daily: with breakfast, and with the evening meal.   I have sent a prescription to [facility], for the ED symptoms Please come back for a follow-up appointment in 3 months.

## 2016-12-26 NOTE — Progress Notes (Signed)
Subjective:    Patient ID: [name], male    DOB: 07-30-1975, 41 y.o.   MRN: [id number]  HPI Pt returns for f/u of diabetes mellitus: DM type: 1 Dx'ed: 1324 Complications: none.   Therapy: insulin since dx.   DKA: he has had 3 more episodes since dx--each with going off his insulin.  Severe hypoglycemia: never Pancreatitis: he had gallstone pancreatitis in late 2014.  Other: we discussed different insulin regimens, and he chooses qd lantus, and prn novolog.  He self-administers insulin.   Interval history: He says he never misses the insulin.  pt states he feels well in general.  no cbg record, but states cbg's vary from 160-200's.  There is no trend throughout the day.   ED sxs persist.  Past Medical History:  Diagnosis Date  . Bipolar disorder ([place])   . Diabetes mellitus without complication ([place])   . Hyperlipidemia   . Pancreatitis     Past Surgical History:  Procedure Laterality Date  . APPENDECTOMY    . CHOLECYSTECTOMY N/A 07/11/2013   Procedure: LAPAROSCOPIC CHOLECYSTECTOMY;  Surgeon: Jamesetta So, MD;  Location: [facility];  Service: General;  Laterality: N/A;  . KNEE SURGERY Bilateral     Social History   Social History  . Marital status: Single    Spouse name: N/A  . Number of children: 0  . Years of education: N/A   Occupational History  . Not on file.   Social History Main Topics  . Smoking status: Current Every Day Smoker    Packs/day: 0.50    Years: 10.00    Types: Cigarettes  . Smokeless tobacco: Never Used  . Alcohol use No  . Drug use: No  . Sexual activity: No   Other Topics Concern  . Not on file   Social History Narrative  . No narrative on file    Current Outpatient Prescriptions on File Prior to Visit  Medication Sig Dispense Refill  . divalproex (DEPAKOTE) 500 MG DR tablet Take 1,000 mg by mouth at bedtime.    . Insulin Glargine (LANTUS) 100 UNIT/ML Solostar Pen Inject 130 Units into the skin every morning. 45 mL 11  .  risperiDONE (RISPERDAL) 1 MG tablet Take 1 mg by mouth at bedtime.    . simvastatin (ZOCOR) 20 MG tablet Take 20 mg by mouth at bedtime.    . solifenacin (VESICARE) 10 MG tablet Take 10 mg by mouth daily.     No current facility-administered medications on file prior to visit.     No Known Allergies  Family History  Problem Relation Age of Onset  . Pancreatitis Neg Hx   . Colon cancer Neg Hx   . Diabetes Mother   . Diabetes Maternal Grandmother     BP 118/78   Pulse (!) 112   Ht 6\' 1"  (1.854 m)   Wt 231 lb (104.8 kg)   SpO2 96%   BMI 30.48 kg/m    Review of Systems He denies hypoglycemia.      Objective:   Physical Exam VITAL SIGNS:  See vs page GENERAL: no distress Pulses: foot pulses are intact bilaterally.   MSK: no deformity of the feet or ankles.  CV: no edema of the legs or ankles Skin:  no ulcer on the feet or ankles.  normal color and temp on the feet and ankles Neuro: sensation is intact to touch on the feet and ankles.     Lab Results  Component Value Date  HGBA1C 7.6 12/26/2016      Assessment & Plan:  Insulin-requiring type 2 DM, with: he needs increased rx.  ED: he requests rx.   Patient Instructions  check your blood sugar 4 times a day: before the 3 meals, and at bedtime.  also check if you have symptoms of your blood sugar being too high or too low.  please keep a record of the readings and bring it to your next appointment here.  You can write it on any piece of paper.  please call us sooner if your blood sugar goes below 70, or if you have a lot of readings over 200.   Please continue the same lantus: 130 units each morning, and:  Increase the novolog to 35 units twice daily: with breakfast, and with the evening meal.   I have sent a prescription to [facility], for the ED symptoms Please come back for a follow-up appointment in 3 months.

## 2017-03-30 ENCOUNTER — Ambulatory Visit: Payer: Medicaid Other | Admitting: Endocrinology

## 2017-04-08 ENCOUNTER — Encounter: Payer: Self-pay | Admitting: Endocrinology

## 2017-04-08 ENCOUNTER — Ambulatory Visit: Payer: Medicaid Other | Admitting: Endocrinology

## 2017-04-08 VITALS — BP 140/84 | HR 105

## 2017-04-08 DIAGNOSIS — E1065 Type 1 diabetes mellitus with hyperglycemia: Secondary | ICD-10-CM

## 2017-04-08 LAB — POCT GLYCOSYLATED HEMOGLOBIN (HGB A1C): HEMOGLOBIN A1C: 9.5

## 2017-04-08 MED ORDER — INSULIN GLARGINE 100 UNIT/ML SOLOSTAR PEN
150.0000 [IU] | PEN_INJECTOR | SUBCUTANEOUS | 11 refills | Status: DC
Start: 2017-04-08 — End: 2017-11-23

## 2017-04-08 MED ORDER — SILDENAFIL CITRATE 100 MG PO TABS: 50.0000 mg | ORAL_TABLET | Freq: Every day | ORAL | 11 refills | Status: DC | PRN

## 2017-04-08 MED ORDER — INSULIN ASPART 100 UNIT/ML FLEXPEN: 30.0000 [IU] | PEN_INJECTOR | Freq: Two times a day (BID) | SUBCUTANEOUS | 11 refills | Status: DC

## 2017-04-08 NOTE — Progress Notes (Signed)
Subjective:    Patient ID: [name], male    DOB: Nov 07, 1975, 41 y.o.   MRN: [id number]  HPI Pt returns for f/u of diabetes mellitus: DM type: 1 Dx'ed: 7893 Complications: none.   Therapy: insulin since dx.   DKA: he has had 3 more episodes since dx--each with going off his insulin.  Severe hypoglycemia: never Pancreatitis: he had gallstone pancreatitis in late 2014.  Other: we discussed different insulin regimens, and he chooses qd lantus, and BID novolog.  He self-administers insulin.   Interval history: He says he never misses the insulin.  pt states he feels well in general.  no cbg record, but states cbg's vary from 140-170 at hs, which is the only time of day he checks.  There is no trend throughout the day.  He takes lantus 120 qd, and novolog 30 units, twice a day (just before meals).  Past Medical History:  Diagnosis Date  . Bipolar disorder ([place])   . Diabetes mellitus without complication ([place])   . Hyperlipidemia   . Pancreatitis     Past Surgical History:  Procedure Laterality Date  . APPENDECTOMY    . KNEE SURGERY Bilateral     Social History   Socioeconomic History  . Marital status: Single    Spouse name: Not on file  . Number of children: 0  . Years of education: Not on file  . Highest education level: Not on file  Social Needs  . Financial resource strain: Not on file  . Food insecurity - worry: Not on file  . Food insecurity - inability: Not on file  . Transportation needs - medical: Not on file  . Transportation needs - non-medical: Not on file  Occupational History  . Not on file  Tobacco Use  . Smoking status: Current Every Day Smoker    Packs/day: 0.50    Years: 10.00    Pack years: 5.00    Types: Cigarettes  . Smokeless tobacco: Never Used  Substance and Sexual Activity  . Alcohol use: No  . Drug use: No  . Sexual activity: No  Other Topics Concern  . Not on file  Social History Narrative  . Not on file    Current  Outpatient Medications on File Prior to Visit  Medication Sig Dispense Refill  . divalproex (DEPAKOTE) 500 MG DR tablet Take 1,000 mg by mouth at bedtime.    . risperiDONE (RISPERDAL) 1 MG tablet Take 1 mg by mouth at bedtime.    . simvastatin (ZOCOR) 20 MG tablet Take 20 mg by mouth at bedtime.    . solifenacin (VESICARE) 10 MG tablet Take 10 mg by mouth daily.     No current facility-administered medications on file prior to visit.     No Known Allergies  Family History  Problem Relation Age of Onset  . Pancreatitis Neg Hx   . Colon cancer Neg Hx   . Diabetes Mother   . Diabetes Maternal Grandmother     BP 140/84 (BP Location: Left Arm, Patient Position: Sitting, Cuff Size: Normal)   Pulse (!) 105   SpO2 97%   Review of Systems He denies hypoglycemia.       Objective:   Physical Exam VITAL SIGNS:  See vs page GENERAL: no distress Pulses: dorsalis pedis intact bilat.   MSK: no deformity of the feet CV: no leg edema Skin:  no ulcer on the feet.  bilat plantar calluses.  normal color and temp on the feet.  Neuro: sensation is intact to touch on the feet.    Lab Results  Component Value Date   HGBA1C 9.5 04/08/2017       Assessment & Plan:  Insulin-requiring type 2 DM: worse.  He declines to increase insulin now.   Noncompliance with cbg recording: this complicates the rx of DM  Patient Instructions  check your blood sugar 4 times a day: before the 3 meals, and at bedtime.  also check if you have symptoms of your blood sugar being too high or too low.  please keep a record of the readings and bring it to your next appointment here.  You can write it on any piece of paper.  please call us sooner if your blood sugar goes below 70, or if you have a lot of readings over 200.   Please continue the same lantus to 150 units each morning, and:  Please continue the same novolog.   Please come back for a follow-up appointment in 2 months.

## 2017-04-08 NOTE — Patient Instructions (Addendum)
check your blood sugar 4 times a day: before the 3 meals, and at bedtime.  also check if you have symptoms of your blood sugar being too high or too low.  please keep a record of the readings and bring it to your next appointment here.  You can write it on any piece of paper.  please call us sooner if your blood sugar goes below 70, or if you have a lot of readings over 200.   Please continue the same lantus to 150 units each morning, and:  Please continue the same novolog.   Please come back for a follow-up appointment in 2 months.

## 2017-06-08 ENCOUNTER — Ambulatory Visit: Payer: Medicaid Other | Admitting: Endocrinology

## 2017-06-08 DIAGNOSIS — Z0289 Encounter for other administrative examinations: Secondary | ICD-10-CM

## 2017-06-09 ENCOUNTER — Encounter (HOSPITAL_COMMUNITY): Payer: Self-pay | Admitting: *Deleted

## 2017-06-09 ENCOUNTER — Emergency Department (HOSPITAL_COMMUNITY): Payer: Medicaid Other

## 2017-06-09 DIAGNOSIS — Z5321 Procedure and treatment not carried out due to patient leaving prior to being seen by health care provider: Secondary | ICD-10-CM | POA: Diagnosis not present

## 2017-06-09 DIAGNOSIS — R0602 Shortness of breath: Secondary | ICD-10-CM | POA: Diagnosis present

## 2017-06-09 NOTE — ED Triage Notes (Signed)
Pt with sob for past few days, np cough, denies fever or N/V

## 2017-06-10 ENCOUNTER — Emergency Department (HOSPITAL_COMMUNITY)
Admission: EM | Admit: 2017-06-10 | Discharge: 2017-06-10 | Disposition: A | Discharge status: Home / Self Care | Payer: Medicaid Other | Attending: Emergency Medicine | Admitting: Emergency Medicine

## 2017-08-19 ENCOUNTER — Ambulatory Visit: Payer: Medicaid Other | Admitting: Endocrinology

## 2017-08-19 ENCOUNTER — Encounter: Payer: Self-pay | Admitting: Endocrinology

## 2017-08-19 VITALS — BP 122/82 | HR 98 | Ht 73.0 in | Wt 229.0 lb

## 2017-08-19 DIAGNOSIS — E1065 Type 1 diabetes mellitus with hyperglycemia: Secondary | ICD-10-CM

## 2017-08-19 LAB — HEMOGLOBIN A1C: Hgb A1c MFr Bld: 6.9 % — ABNORMAL HIGH (ref 4.6–6.5)

## 2017-08-19 NOTE — Progress Notes (Signed)
Subjective:    Patient ID: [name], male    DOB: 1976-01-26, 42 y.o.   MRN: [id number]  HPI Pt returns for f/u of diabetes mellitus: DM type: 1 Dx'ed: 0865 Complications: none.   Therapy: insulin since dx.   DKA: he has had 3 more episodes since dx--each with going off his insulin.  Severe hypoglycemia: never Pancreatitis: he had gallstone pancreatitis in late 2014.  Other: we discussed different insulin regimens, and he chooses qd lantus, and BID novolog.  He self-administers insulin.   Interval history: He says he never misses the insulin.  pt states he feels better in general, since recent pneumonia.  no cbg record, but states cbg's are well-controlled.  However, he seldom checks cbg.  There is no trend throughout the day.  He takes lantus 120 qd, and novolog 30 units, twice a day (just before meals).  He says he feels as though he has mild hypoglycemia when he was sick, but not since then.  Past Medical History:  Diagnosis Date  . Bipolar disorder ([place])   . Diabetes mellitus without complication ([place])   . Hyperlipidemia   . Pancreatitis     Past Surgical History:  Procedure Laterality Date  . APPENDECTOMY    . CHOLECYSTECTOMY N/A 07/11/2013   Procedure: LAPAROSCOPIC CHOLECYSTECTOMY;  Surgeon: Jamesetta So, MD;  Location: [facility];  Service: General;  Laterality: N/A;  . KNEE SURGERY Bilateral     Social History   Socioeconomic History  . Marital status: Single    Spouse name: Not on file  . Number of children: 0  . Years of education: Not on file  . Highest education level: Not on file  Occupational History  . Not on file  Social Needs  . Financial resource strain: Not on file  . Food insecurity:    Worry: Not on file    Inability: Not on file  . Transportation needs:    Medical: Not on file    Non-medical: Not on file  Tobacco Use  . Smoking status: Current Every Day Smoker    Packs/day: 0.50    Years: 10.00    Pack years: 5.00    Types:  Cigarettes  . Smokeless tobacco: Never Used  Substance and Sexual Activity  . Alcohol use: No  . Drug use: No  . Sexual activity: Never  Lifestyle  . Physical activity:    Days per week: Not on file    Minutes per session: Not on file  . Stress: Not on file  Relationships  . Social connections:    Talks on phone: Not on file    Gets together: Not on file    Attends religious service: Not on file    Active member of club or organization: Not on file    Attends meetings of clubs or organizations: Not on file    Relationship status: Not on file  . Intimate partner violence:    Fear of current or ex partner: Not on file    Emotionally abused: Not on file    Physically abused: Not on file    Forced sexual activity: Not on file  Other Topics Concern  . Not on file  Social History Narrative  . Not on file    Current Outpatient Medications on File Prior to Visit  Medication Sig Dispense Refill  . divalproex (DEPAKOTE) 500 MG DR tablet Take 1,000 mg by mouth at bedtime.    . insulin aspart (NOVOLOG FLEXPEN) 100 UNIT/ML  FlexPen Inject 30 Units 2 (two) times daily with a meal into the skin. 30 mL 11  . Insulin Glargine (LANTUS) 100 UNIT/ML Solostar Pen Inject 150 Units every morning into the skin. 20 pen 11  . risperiDONE (RISPERDAL) 1 MG tablet Take 1 mg by mouth at bedtime.    . sildenafil (VIAGRA) 100 MG tablet Take 0.5-1 tablets (50-100 mg total) daily as needed by mouth for erectile dysfunction. 30 tablet 11  . simvastatin (ZOCOR) 20 MG tablet Take 20 mg by mouth at bedtime.    . solifenacin (VESICARE) 10 MG tablet Take 10 mg by mouth daily.     No current facility-administered medications on file prior to visit.     No Known Allergies  Family History  Problem Relation Age of Onset  . Diabetes Mother   . Diabetes Maternal Grandmother   . Pancreatitis Neg Hx   . Colon cancer Neg Hx     BP 122/82   Pulse 98   Ht 6\' 1"  (1.854 m)   Wt 229 lb (103.9 kg)   SpO2 96%   BMI  30.21 kg/m   Review of Systems Denies LOC    Objective:   Physical Exam VITAL SIGNS:  See vs page GENERAL: no distress Pulses: foot pulses are intact bilaterally.   MSK: no deformity of the feet or ankles.  CV: no edema of the legs or ankles Skin:  no ulcer on the feet or ankles.  normal color and temp on the feet and ankles Neuro: sensation is intact to touch on the feet and ankles.     Lab Results  Component Value Date   HGBA1C 6.9 (H) 08/19/2017      Assessment & Plan:  Type 1 DM: well-controlled.   Noncompliance with cbg recording: we discussed need to monitor cbg's.   Patient Instructions  check your blood sugar 4 times a day: before the 3 meals, and at bedtime.  also check if you have symptoms of your blood sugar being too high or too low.  please keep a record of the readings and bring it to your next appointment here.  You can write it on any piece of paper.  please call us sooner if your blood sugar goes below 70, or if you have a lot of readings over 200.   A diabetes blood test is requested for you today.  We'll let you know about the results.     Please come back for a follow-up appointment in 2 months.

## 2017-08-19 NOTE — Patient Instructions (Addendum)
check your blood sugar 4 times a day: before the 3 meals, and at bedtime.  also check if you have symptoms of your blood sugar being too high or too low.  please keep a record of the readings and bring it to your next appointment here.  You can write it on any piece of paper.  please call us sooner if your blood sugar goes below 70, or if you have a lot of readings over 200.   A diabetes blood test is requested for you today.  We'll let you know about the results.     Please come back for a follow-up appointment in 2 months.

## 2017-10-19 ENCOUNTER — Ambulatory Visit: Payer: Medicaid Other | Admitting: Endocrinology

## 2017-10-27 ENCOUNTER — Ambulatory Visit (HOSPITAL_COMMUNITY)
Admission: RE | Admit: 2017-10-27 | Discharge: 2017-10-27 | Disposition: A | Discharge status: Home / Self Care | Payer: Medicaid Other | Source: Ambulatory Visit | Attending: Pulmonary Disease | Admitting: Pulmonary Disease

## 2017-10-27 ENCOUNTER — Other Ambulatory Visit (HOSPITAL_COMMUNITY): Payer: Self-pay | Admitting: Pulmonary Disease

## 2017-10-27 DIAGNOSIS — J849 Interstitial pulmonary disease, unspecified: Secondary | ICD-10-CM | POA: Insufficient documentation

## 2017-10-27 DIAGNOSIS — J189 Pneumonia, unspecified organism: Secondary | ICD-10-CM

## 2017-11-23 ENCOUNTER — Encounter: Payer: Self-pay | Admitting: Endocrinology

## 2017-11-23 ENCOUNTER — Ambulatory Visit: Payer: Medicaid Other | Admitting: Endocrinology

## 2017-11-23 VITALS — BP 102/64 | HR 101 | Wt 213.0 lb

## 2017-11-23 DIAGNOSIS — E1065 Type 1 diabetes mellitus with hyperglycemia: Secondary | ICD-10-CM | POA: Diagnosis not present

## 2017-11-23 LAB — POCT GLYCOSYLATED HEMOGLOBIN (HGB A1C): Hemoglobin A1C: 5.8 % — AB (ref 4.0–5.6)

## 2017-11-23 MED ORDER — INSULIN GLARGINE 100 UNIT/ML SOLOSTAR PEN: 120.0000 [IU] | PEN_INJECTOR | SUBCUTANEOUS | 11 refills | Status: DC

## 2017-11-23 NOTE — Patient Instructions (Addendum)
check your blood sugar 4 times a day: before the 3 meals, and at bedtime.  also check if you have symptoms of your blood sugar being too high or too low.  please keep a record of the readings and bring it to your next appointment here.  You can write it on any piece of paper.  please call us sooner if your blood sugar goes below 70, or if you have a lot of readings over 200.   Please reduce the lantus to 120 units each morning, and: Please continue the same novolog.    Please come back for a follow-up appointment in 2 months.

## 2017-11-23 NOTE — Progress Notes (Signed)
Subjective:    Patient ID: [name], male    DOB: 1975/12/29, 42 y.o.   MRN: [id number]  HPI Pt returns for f/u of diabetes mellitus: DM type: 1 Dx'ed: 7829 Complications: renal insuff Therapy: insulin since dx.   DKA: he has had 3 episodes since dx--each with going off his insulin.  Severe hypoglycemia: never Pancreatitis: he had gallstone pancreatitis in late 2014.  Other: we discussed different insulin regimens, and he chooses qd lantus, and BID novolog.  He self-administers insulin.   Interval history: He says he never misses the insulin.  pt states he feels well in general.  he seldom checks cbg.  He has frequent mild hypoglycemia several times per week.  This usually happens at bedtime.   Past Medical History:  Diagnosis Date  . Bipolar disorder ([place])   . Diabetes mellitus without complication ([place])   . Hyperlipidemia   . Pancreatitis     Past Surgical History:  Procedure Laterality Date  . APPENDECTOMY    . CHOLECYSTECTOMY N/A 07/11/2013   Procedure: LAPAROSCOPIC CHOLECYSTECTOMY;  Surgeon: Jamesetta So, MD;  Location: [facility];  Service: General;  Laterality: N/A;  . KNEE SURGERY Bilateral     Social History   Socioeconomic History  . Marital status: Single    Spouse name: Not on file  . Number of children: 0  . Years of education: Not on file  . Highest education level: Not on file  Occupational History  . Not on file  Social Needs  . Financial resource strain: Not on file  . Food insecurity:    Worry: Not on file    Inability: Not on file  . Transportation needs:    Medical: Not on file    Non-medical: Not on file  Tobacco Use  . Smoking status: Current Every Day Smoker    Packs/day: 0.50    Years: 10.00    Pack years: 5.00    Types: Cigarettes  . Smokeless tobacco: Never Used  Substance and Sexual Activity  . Alcohol use: No  . Drug use: No  . Sexual activity: Never  Lifestyle  . Physical activity:    Days per week: Not on file   Minutes per session: Not on file  . Stress: Not on file  Relationships  . Social connections:    Talks on phone: Not on file    Gets together: Not on file    Attends religious service: Not on file    Active member of club or organization: Not on file    Attends meetings of clubs or organizations: Not on file    Relationship status: Not on file  . Intimate partner violence:    Fear of current or ex partner: Not on file    Emotionally abused: Not on file    Physically abused: Not on file    Forced sexual activity: Not on file  Other Topics Concern  . Not on file  Social History Narrative  . Not on file    Current Outpatient Medications on File Prior to Visit  Medication Sig Dispense Refill  . divalproex (DEPAKOTE) 500 MG DR tablet Take 1,000 mg by mouth at bedtime.    . insulin aspart (NOVOLOG FLEXPEN) 100 UNIT/ML FlexPen Inject 30 Units 2 (two) times daily with a meal into the skin. 30 mL 11  . risperiDONE (RISPERDAL) 1 MG tablet Take 1 mg by mouth at bedtime.    . sildenafil (VIAGRA) 100 MG tablet Take 0.5-1 tablets (  50-100 mg total) daily as needed by mouth for erectile dysfunction. 30 tablet 11  . simvastatin (ZOCOR) 20 MG tablet Take 20 mg by mouth at bedtime.    . solifenacin (VESICARE) 10 MG tablet Take 10 mg by mouth daily.     No current facility-administered medications on file prior to visit.     No Known Allergies  Family History  Problem Relation Age of Onset  . Diabetes Mother   . Diabetes Maternal Grandmother   . Pancreatitis Neg Hx   . Colon cancer Neg Hx     BP 102/64   Pulse (!) 101   Wt 213 lb (96.6 kg)   SpO2 95%   BMI 28.10 kg/m   Review of Systems He has lost weight, due to recent pneumonia.      Objective:   Physical Exam VITAL SIGNS:  See vs page GENERAL: no distress Pulses: dorsalis pedis intact bilat.   MSK: no deformity of the feet CV: no leg edema Skin:  no ulcer on the feet.  normal color and temp on the feet. Neuro: sensation is  intact to touch on the feet Ext: heavy calluses on the plantar aspect of the right foot   Lab Results  Component Value Date   HGBA1C 5.8 (A) 11/23/2017   Lab Results  Component Value Date   CREATININE 1.27 07/05/2013   BUN 27 (H) 07/05/2013   NA 140 07/11/2013   K 4.3 07/11/2013   CL 93 (L) 07/05/2013   CO2 20 07/05/2013       Assessment & Plan:  Type 1 DM, with renal insuff: overcontrolled.    Patient Instructions  check your blood sugar 4 times a day: before the 3 meals, and at bedtime.  also check if you have symptoms of your blood sugar being too high or too low.  please keep a record of the readings and bring it to your next appointment here.  You can write it on any piece of paper.  please call us sooner if your blood sugar goes below 70, or if you have a lot of readings over 200.   Please reduce the lantus to 120 units each morning, and: Please continue the same novolog.    Please come back for a follow-up appointment in 2 months.

## 2017-11-24 ENCOUNTER — Ambulatory Visit (HOSPITAL_COMMUNITY)
Admission: RE | Admit: 2017-11-24 | Discharge: 2017-11-24 | Disposition: A | Discharge status: Home / Self Care | Payer: Medicaid Other | Source: Ambulatory Visit | Attending: Pulmonary Disease | Admitting: Pulmonary Disease

## 2017-11-24 ENCOUNTER — Other Ambulatory Visit (HOSPITAL_COMMUNITY): Payer: Self-pay | Admitting: Pulmonary Disease

## 2017-11-24 DIAGNOSIS — J984 Other disorders of lung: Secondary | ICD-10-CM | POA: Insufficient documentation

## 2017-11-24 DIAGNOSIS — J189 Pneumonia, unspecified organism: Secondary | ICD-10-CM

## 2017-12-02 ENCOUNTER — Other Ambulatory Visit (HOSPITAL_COMMUNITY): Payer: Self-pay | Admitting: Pulmonary Disease

## 2017-12-02 ENCOUNTER — Ambulatory Visit (HOSPITAL_COMMUNITY)
Admission: RE | Admit: 2017-12-02 | Discharge: 2017-12-02 | Disposition: A | Discharge status: Home / Self Care | Payer: Medicaid Other | Source: Ambulatory Visit | Attending: Pulmonary Disease | Admitting: Pulmonary Disease

## 2017-12-02 DIAGNOSIS — R590 Localized enlarged lymph nodes: Secondary | ICD-10-CM | POA: Insufficient documentation

## 2017-12-02 DIAGNOSIS — R0602 Shortness of breath: Secondary | ICD-10-CM | POA: Insufficient documentation

## 2017-12-02 DIAGNOSIS — J690 Pneumonitis due to inhalation of food and vomit: Secondary | ICD-10-CM | POA: Diagnosis present

## 2017-12-02 DIAGNOSIS — J849 Interstitial pulmonary disease, unspecified: Secondary | ICD-10-CM | POA: Insufficient documentation

## 2017-12-04 ENCOUNTER — Other Ambulatory Visit (HOSPITAL_COMMUNITY): Payer: Self-pay | Admitting: Pulmonary Disease

## 2017-12-22 ENCOUNTER — Institutional Professional Consult (permissible substitution): Payer: Medicaid Other | Admitting: Internal Medicine

## 2018-01-25 ENCOUNTER — Ambulatory Visit: Payer: Medicaid Other | Admitting: Endocrinology

## 2018-01-25 DIAGNOSIS — Z0289 Encounter for other administrative examinations: Secondary | ICD-10-CM

## 2018-01-28 ENCOUNTER — Encounter: Payer: Self-pay | Admitting: *Deleted

## 2018-02-02 ENCOUNTER — Encounter: Payer: Self-pay | Admitting: Internal Medicine

## 2018-02-02 ENCOUNTER — Ambulatory Visit (INDEPENDENT_AMBULATORY_CARE_PROVIDER_SITE_OTHER): Payer: Medicaid Other | Admitting: Internal Medicine

## 2018-02-02 ENCOUNTER — Other Ambulatory Visit (INDEPENDENT_AMBULATORY_CARE_PROVIDER_SITE_OTHER): Payer: Medicaid Other

## 2018-02-02 VITALS — BP 120/84 | HR 113 | Ht 72.5 in | Wt 207.6 lb

## 2018-02-02 DIAGNOSIS — R05 Cough: Secondary | ICD-10-CM

## 2018-02-02 DIAGNOSIS — J849 Interstitial pulmonary disease, unspecified: Secondary | ICD-10-CM | POA: Diagnosis not present

## 2018-02-02 DIAGNOSIS — R0600 Dyspnea, unspecified: Secondary | ICD-10-CM | POA: Diagnosis not present

## 2018-02-02 DIAGNOSIS — R059 Cough, unspecified: Secondary | ICD-10-CM

## 2018-02-02 LAB — NITRIC OXIDE: NITRIC OXIDE: 11

## 2018-02-02 LAB — SEDIMENTATION RATE: Sed Rate: 130 mm/hr — ABNORMAL HIGH (ref 0–15)

## 2018-02-02 NOTE — Progress Notes (Signed)
 Subjective:    Patient ID: [name], male    DOB: 08/07/1975, 42 y.o.   MRN: [id number]  PCP Hawkins, Edward, MD   HPI  IOV 02/02/2018  Chief Complaint  Patient presents with  . Consult   42-year-old African-American male referred by Dr. Edward Hawkins who is his primary care physician. Patient is diabetic with a prior history of gastroparesis but started on acid reflux medications today. He tells me that is also a smoker but does not vAPE. He smokes tobacco. He is living with his parents/mom with 3 years ago and moved to an apartment that apparently had mold but he moved out of that apartment 3 months ago. Starting January 2019 he had an episode of pneumonia that never really fully cleared and since then has residual cough and shortness of breath and fatigue. He also has noisy breathing that he can hear audibly. He has had 2 or 3 course of antibiotic/prednisone with temporary transient relief only to have the symptoms worsen again.he's had several chest x-rays that showed evidence of ILD but finally resulted in CT scan of the chest without contrast in July 2019 all of which detailed below. Walking desaturation test showed he dropped pulse ox greater than 3 points and got very tachycardic but did not cross the threshold for starting oxygen which is below 88%.   Radiologic images personally visualized. In 2010 and 2014 he had CT abdomen of which I personally visualized the lung images at the base and there was no evidence of ILD. The next set of radiologic imaging starts in January 2019 with a chest x-ray that shows chronic ILD changes. This was then confirmed on a CT scan of the chest without contrast not high resolution glycol 2019 that shows diffuse bilateral groundglass opacitiesthat based on 2018 ATS criteria truly indeterminate for UIP in my opinion v alternate diagnosis   [place]  ILD questionnaire  Symptoms: he reports to shortness of breath that started suddenly and since it  startedit has remained the same. He does haveepisodes of dyspnea. He has level to dyspnea for showering and doing laundry but level for dyspnea for walking on a level course of the same age and shopping and walking up stairs and sexual activities. He also admits to a cough since July of this year. The cough is present at night associated with thick mucus but no hemoptysis. Cough is stable since it started he does avoid social places because of the cough and it does limit his physical activities. He also has associated wheezing  Past medical history: He has a history of asthma not otherwise specified for few months probably in the midst of this ILD disease this level was given. Denies any COPD or heart failure or rheumatoid arthritis or other connective tissue diseases or pulmonary hypertension or stroke. He does have diabetes for a few decades but no heart disease  Review of systems: He has fatigue for the last several months but no arthralgia or dryness or discoloration of his fingers. He does have a 20 pound weight loss for the last several months but no oral ulcers  Family history of pulmonary fibrosis: Denies  Exposure history personal: He smokes cigarettes since 1997. Smokes around 8-10 cigarettes per day. Has smoked marijuana but not currently. His never used any cocaine no IV drug use  Home exposures history: He lives in an apartment for the last 3 months and in setting. But before that he lived in another apartment that had mold    ILD related occupational history: Completely denies100 times of exposure  Medication history related to ILD: Denies    Simple office walk 185 feet x  3 laps goal with forehead probe 02/02/2018 feno 11 ppb and normal  O2 used Room air  Number laps completed 3  Comments about pace Slow mode  Resting Pulse Ox/HR 98% and 115/min  Final Pulse Ox/HR 91% and 140/min  Desaturated </= 88% no  Desaturated <= 3% points yes  Got Tachycardic >/= 90/min yes  Symptoms at  end of test x  Miscellaneous comments x       Review of Systems  Constitutional: Positive for appetite change (Patient stated he does not eat at much), chills, fatigue and unexpected weight change. Negative for activity change.  HENT: Positive for congestion and sore throat.   Eyes: Negative.   Respiratory: Positive for cough, shortness of breath and wheezing (Was told by prior doctor he had rattling sound).   Cardiovascular: Negative.   Gastrointestinal: Negative.   Endocrine: Positive for polydipsia.  Genitourinary: Negative.   Musculoskeletal: Positive for arthralgias.  Skin: Positive for wound (boils - gets antibiotic when needed to treat).  Allergic/Immunologic: Negative.   Neurological: Positive for weakness (If blood sugar gets too low).  Hematological: Negative.   Psychiatric/Behavioral: Negative.       has a past medical history of Bipolar disorder ([facility]), Diabetes mellitus without complication ([facility]), Hyperlipidemia, and Pancreatitis.   reports that he has been smoking cigarettes. He has a 5.00 pack-year smoking history. He has never used smokeless tobacco.  Past Surgical History:  Procedure Laterality Date  . APPENDECTOMY    . CHOLECYSTECTOMY N/A 07/11/2013   Procedure: LAPAROSCOPIC CHOLECYSTECTOMY;  Surgeon: Mark A Jenkins, MD;  Location: [facility];  Service: General;  Laterality: N/A;  . KNEE SURGERY Bilateral     No Known Allergies  Immunization History  Administered Date(s) Administered  . Influenza,inj,Quad PF,6+ Mos 06/06/2013    Family History  Problem Relation Age of Onset  . Diabetes Mother   . Diabetes Maternal Grandmother   . Pancreatitis Neg Hx   . Colon cancer Neg Hx      Current Outpatient Medications:  .  divalproex (DEPAKOTE) 500 MG DR tablet, Take 1,000 mg by mouth at bedtime., Disp: , Rfl:  .  insulin aspart (NOVOLOG FLEXPEN) 100 UNIT/ML FlexPen, Inject 30 Units 2 (two) times daily with a meal into the skin., Disp: 30 mL, Rfl: 11 .   Insulin Glargine (LANTUS) 100 UNIT/ML Solostar Pen, Inject 120 Units into the skin every morning., Disp: 15 pen, Rfl: 11 .  risperiDONE (RISPERDAL) 1 MG tablet, Take 1 mg by mouth at bedtime., Disp: , Rfl:  .  sildenafil (VIAGRA) 100 MG tablet, Take 0.5-1 tablets (50-100 mg total) daily as needed by mouth for erectile dysfunction., Disp: 30 tablet, Rfl: 11 .  simvastatin (ZOCOR) 20 MG tablet, Take 20 mg by mouth at bedtime., Disp: , Rfl:  .  solifenacin (VESICARE) 10 MG tablet, Take 10 mg by mouth daily., Disp: , Rfl:       Objective:   Physical Exam  Constitutional: He is oriented to person, place, and time. He appears well-developed and well-nourished. No distress.  HENT:  Head: Normocephalic and atraumatic.  Right Ear: External ear normal.  Left Ear: External ear normal.  Mouth/Throat: Oropharynx is clear and moist. No oropharyngeal exudate.  Eyes: Pupils are equal, round, and reactive to light. Conjunctivae and EOM are normal. Right eye exhibits no discharge. Left eye exhibits no   discharge. No scleral icterus.  Neck: Normal range of motion. Neck supple. No JVD present. No tracheal deviation present. No thyromegaly present.  Cardiovascular: Normal rate, regular rhythm and intact distal pulses. Exam reveals no gallop and no friction rub.  No murmur heard. Pulmonary/Chest: Effort normal and breath sounds normal. No respiratory distress. He has no wheezes. He has no rales. He exhibits no tenderness.  Diffuse fine crackles  Abdominal: Soft. Bowel sounds are normal. He exhibits no distension and no mass. There is no tenderness. There is no rebound and no guarding.  Musculoskeletal: Normal range of motion. He exhibits no edema or tenderness.  Clubbing +  Lymphadenopathy:    He has no cervical adenopathy.  Neurological: He is alert and oriented to person, place, and time. He has normal reflexes. No cranial nerve deficit. Coordination normal.  Skin: Skin is warm and dry. No rash noted. He is  not diaphoretic. No erythema. No pallor.  Psychiatric: He has a normal mood and affect. His behavior is normal. Judgment and thought content normal.  Nursing note and vitals reviewed.   Vitals:   02/02/18 1417  BP: 120/84  Pulse: (!) 113  SpO2: 95%  Weight: 207 lb 9.6 oz (94.2 kg)  Height: 6' 0.5" (1.842 m)    Estimated body mass index is 27.77 kg/m as calculated from the following:   Height as of this encounter: 6' 0.5" (1.842 m).   Weight as of this encounter: 207 lb 9.6 oz (94.2 kg).        Assessment & Plan:     ICD-10-CM   1. ILD (interstitial lung disease) ([place]) J84.9        - IYou  have Interstitial Lung Disease (ILD); a type of inflammation in the lung  -  There are > 100 varieties of this; the cause is not known in your case (as yet)  PLAN - To narrow down possibilities and assess severity please do the following tests  - do spirometry and dlco test (choose a location depending on schedule and convenience)  -  do overnight oxygen test on room air  - do autoimmune panel: Serum: ESR, ANA, DS-DNA, RF, anti-CCP, ssA, ssB, scl-70, ANCA, MPO and PR-3 antibodies, Total CK,  Aldolase,  Hypersensitivity Pneumonitis Panel - will call with results  - schedule bronchoscopy with lavage 02/09/18 at [facility] @ 11am slot  - if you get worse in between let us know immediately [phone]  Followup  - 3 weeks wth Dr Chase Caller or  with APP - Lynelle Smoke or Judson Roch or Aaron Edelman to discuss results and followup       SIGNATURE    Dr. Brand Males, M.D., F.C.C.P,  Pulmonary and Critical Care Medicine Staff Physician, [place] Director - Interstitial Lung Disease  Program  Pulmonary [place] [facility] [place], [place], [address]  Pager: [phone], If no answer or between  15:00h - 7:00h: call [phone] Telephone: [phone]  3:03 PM 02/02/2018

## 2018-02-02 NOTE — Patient Instructions (Addendum)
ICD-10-CM   1. ILD (interstitial lung disease) ([place]) J84.9      - You  have Interstitial Lung Disease (ILD); a type of inflammation in the lung  -  There are > 100 varieties of this; the cause is not known in your case (as yet)  PLAN - To narrow down possibilities and assess severity please do the following tests  - do spirometry and dlco test (choose a location depending on schedule and convenience)  -  do overnight oxygen test on room air  - do autoimmune panel: Serum: ESR, ANA, DS-DNA, RF, anti-CCP, ssA, ssB, scl-70, ANCA, MPO and PR-3 antibodies, Total CK,  Aldolase,  Hypersensitivity Pneumonitis Panel - will call with results  - schedule bronchoscopy with lavage 02/09/18 at [facility] @ 11am slot  - if you get worse in between let us know immediately [phone]  Followup  - 3 weeks wth Dr Chase Caller or  with APP - [name] or Judson Roch or Aaron Edelman to discuss results and followup

## 2018-02-02 NOTE — H&P (View-Only) (Signed)
Subjective:    Patient ID: [name], male    DOB: 28-Sep-1975, 42 y.o.   MRN: [id number]  PCP Sinda Du, MD   HPI  IOV 02/02/2018  Chief Complaint  Patient presents with  . Consult   42 year old African-American male referred by Dr. Sinda Du who is his primary care physician. Patient is diabetic with a prior history of gastroparesis but started on acid reflux medications today. He tells me that is also a smoker but does not vAPE. He smokes tobacco. He is living with his parents/mom with 3 years ago and moved to an apartment that apparently had mold but he moved out of that apartment 3 months ago. Starting January 2019 he had an episode of pneumonia that never really fully cleared and since then has residual cough and shortness of breath and fatigue. He also has noisy breathing that he can hear audibly. He has had 2 or 3 course of antibiotic/prednisone with temporary transient relief only to have the symptoms worsen again.he's had several chest x-rays that showed evidence of ILD but finally resulted in CT scan of the chest without contrast in July 2019 all of which detailed below. Walking desaturation test showed he dropped pulse ox greater than 3 points and got very tachycardic but did not cross the threshold for starting oxygen which is below 88%.   Radiologic images personally visualized. In 2010 and 2014 he had CT abdomen of which I personally visualized the lung images at the base and there was no evidence of ILD. The next set of radiologic imaging starts in January 2019 with a chest x-ray that shows chronic ILD changes. This was then confirmed on a CT scan of the chest without contrast not high resolution glycol 2019 that shows diffuse bilateral groundglass opacitiesthat based on 2018 ATS criteria truly indeterminate for UIP in my opinion v alternate diagnosis   [place]  ILD questionnaire  Symptoms: he reports to shortness of breath that started suddenly and since it  startedit has remained the same. He does haveepisodes of dyspnea. He has level to dyspnea for showering and doing laundry but level for dyspnea for walking on a level course of the same age and shopping and walking up stairs and sexual activities. He also admits to a cough since July of this year. The cough is present at night associated with thick mucus but no hemoptysis. Cough is stable since it started he does avoid social places because of the cough and it does limit his physical activities. He also has associated wheezing  Past medical history: He has a history of asthma not otherwise specified for few months probably in the midst of this ILD disease this level was given. Denies any COPD or heart failure or rheumatoid arthritis or other connective tissue diseases or pulmonary hypertension or stroke. He does have diabetes for a few decades but no heart disease  Review of systems: He has fatigue for the last several months but no arthralgia or dryness or discoloration of his fingers. He does have a 20 pound weight loss for the last several months but no oral ulcers  Family history of pulmonary fibrosis: Denies  Exposure history personal: He smokes cigarettes since 1997. Smokes around 8-10 cigarettes per day. Has smoked marijuana but not currently. His never used any cocaine no IV drug use  Home exposures history: He lives in an apartment for the last 3 months and in setting. But before that he lived in another apartment that had mold  ILD related occupational history: Completely denies100 times of exposure  Medication history related to ILD: Denies    Simple office walk 185 feet x  3 laps goal with forehead probe 02/02/2018 feno 11 ppb and normal  O2 used Room air  Number laps completed 3  Comments about pace Slow mode  Resting Pulse Ox/HR 98% and 115/min  Final Pulse Ox/HR 91% and 140/min  Desaturated </= 88% no  Desaturated <= 3% points yes  Got Tachycardic >/= 90/min yes  Symptoms at  end of test x  Miscellaneous comments x       Review of Systems  Constitutional: Positive for appetite change (Patient stated he does not eat at much), chills, fatigue and unexpected weight change. Negative for activity change.  HENT: Positive for congestion and sore throat.   Eyes: Negative.   Respiratory: Positive for cough, shortness of breath and wheezing (Was told by prior doctor he had rattling sound).   Cardiovascular: Negative.   Gastrointestinal: Negative.   Endocrine: Positive for polydipsia.  Genitourinary: Negative.   Musculoskeletal: Positive for arthralgias.  Skin: Positive for wound (boils - gets antibiotic when needed to treat).  Allergic/Immunologic: Negative.   Neurological: Positive for weakness (If blood sugar gets too low).  Hematological: Negative.   Psychiatric/Behavioral: Negative.       has a past medical history of Bipolar disorder ([place]), Diabetes mellitus without complication ([place]), Hyperlipidemia, and Pancreatitis.   reports that he has been smoking cigarettes. He has a 5.00 pack-year smoking history. He has never used smokeless tobacco.  Past Surgical History:  Procedure Laterality Date  . APPENDECTOMY    . CHOLECYSTECTOMY N/A 07/11/2013   Procedure: LAPAROSCOPIC CHOLECYSTECTOMY;  Surgeon: Jamesetta So, MD;  Location: [facility];  Service: General;  Laterality: N/A;  . KNEE SURGERY Bilateral     No Known Allergies  Immunization History  Administered Date(s) Administered  . Influenza,inj,Quad PF,6+ Mos 06/06/2013    Family History  Problem Relation Age of Onset  . Diabetes Mother   . Diabetes Maternal Grandmother   . Pancreatitis Neg Hx   . Colon cancer Neg Hx      Current Outpatient Medications:  .  divalproex (DEPAKOTE) 500 MG DR tablet, Take 1,000 mg by mouth at bedtime., Disp: , Rfl:  .  insulin aspart (NOVOLOG FLEXPEN) 100 UNIT/ML FlexPen, Inject 30 Units 2 (two) times daily with a meal into the skin., Disp: 30 mL, Rfl: 11 .   Insulin Glargine (LANTUS) 100 UNIT/ML Solostar Pen, Inject 120 Units into the skin every morning., Disp: 15 pen, Rfl: 11 .  risperiDONE (RISPERDAL) 1 MG tablet, Take 1 mg by mouth at bedtime., Disp: , Rfl:  .  sildenafil (VIAGRA) 100 MG tablet, Take 0.5-1 tablets (50-100 mg total) daily as needed by mouth for erectile dysfunction., Disp: 30 tablet, Rfl: 11 .  simvastatin (ZOCOR) 20 MG tablet, Take 20 mg by mouth at bedtime., Disp: , Rfl:  .  solifenacin (VESICARE) 10 MG tablet, Take 10 mg by mouth daily., Disp: , Rfl:       Objective:   Physical Exam  Constitutional: He is oriented to person, place, and time. He appears well-developed and well-nourished. No distress.  HENT:  Head: Normocephalic and atraumatic.  Right Ear: External ear normal.  Left Ear: External ear normal.  Mouth/Throat: Oropharynx is clear and moist. No oropharyngeal exudate.  Eyes: Pupils are equal, round, and reactive to light. Conjunctivae and EOM are normal. Right eye exhibits no discharge. Left eye exhibits no  discharge. No scleral icterus.  Neck: Normal range of motion. Neck supple. No JVD present. No tracheal deviation present. No thyromegaly present.  Cardiovascular: Normal rate, regular rhythm and intact distal pulses. Exam reveals no gallop and no friction rub.  No murmur heard. Pulmonary/Chest: Effort normal and breath sounds normal. No respiratory distress. He has no wheezes. He has no rales. He exhibits no tenderness.  Diffuse fine crackles  Abdominal: Soft. Bowel sounds are normal. He exhibits no distension and no mass. There is no tenderness. There is no rebound and no guarding.  Musculoskeletal: Normal range of motion. He exhibits no edema or tenderness.  Clubbing +  Lymphadenopathy:    He has no cervical adenopathy.  Neurological: He is alert and oriented to person, place, and time. He has normal reflexes. No cranial nerve deficit. Coordination normal.  Skin: Skin is warm and dry. No rash noted. He is  not diaphoretic. No erythema. No pallor.  Psychiatric: He has a normal mood and affect. His behavior is normal. Judgment and thought content normal.  Nursing note and vitals reviewed.   Vitals:   02/02/18 1417  BP: 120/84  Pulse: (!) 113  SpO2: 95%  Weight: 207 lb 9.6 oz (94.2 kg)  Height: 6' 0.5" (1.842 m)    Estimated body mass index is 27.77 kg/m as calculated from the following:   Height as of this encounter: 6' 0.5" (1.842 m).   Weight as of this encounter: 207 lb 9.6 oz (94.2 kg).        Assessment & Plan:     ICD-10-CM   1. ILD (interstitial lung disease) ([place]) J84.9        - IYou  have Interstitial Lung Disease (ILD); a type of inflammation in the lung  -  There are > 100 varieties of this; the cause is not known in your case (as yet)  PLAN - To narrow down possibilities and assess severity please do the following tests  - do spirometry and dlco test (choose a location depending on schedule and convenience)  -  do overnight oxygen test on room air  - do autoimmune panel: Serum: ESR, ANA, DS-DNA, RF, anti-CCP, ssA, ssB, scl-70, ANCA, MPO and PR-3 antibodies, Total CK,  Aldolase,  Hypersensitivity Pneumonitis Panel - will call with results  - schedule bronchoscopy with lavage 02/09/18 at [facility] @ 11am slot  - if you get worse in between let us know immediately [phone]  Followup  - 3 weeks wth Dr Chase Caller or  with APP - Lynelle Smoke or Judson Roch or Aaron Edelman to discuss results and followup       SIGNATURE    Dr. Brand Males, M.D., F.C.C.P,  Pulmonary and Critical Care Medicine Staff Physician, [place] Director - Interstitial Lung Disease  Program  Pulmonary [place] at [place], [place], [address]  Pager: [phone], If no answer or between  15:00h - 7:00h: call [phone] Telephone: [phone]  3:03 PM 02/02/2018

## 2018-02-02 NOTE — Progress Notes (Signed)
   Subjective:    Patient ID: [name], male    DOB: 02/15/1976, 42 y.o.   MRN: [id number]  HPI    Review of Systems     Objective:   Physical Exam        Assessment & Plan:

## 2018-02-04 ENCOUNTER — Encounter: Payer: Self-pay | Admitting: Internal Medicine

## 2018-02-04 LAB — MPO/PR-3 (ANCA) ANTIBODIES: Myeloperoxidase Abs: <1 AI

## 2018-02-04 LAB — CK TOTAL AND CKMB (NOT AT ARMC)
CK, MB: 1.6 ng/mL (ref 0–5.0)
Relative Index: 2.4 (ref 0–4.0)
Total CK: 68 U/L (ref 44–196)

## 2018-02-04 LAB — ANA: Anti Nuclear Antibody(ANA): NEGATIVE

## 2018-02-04 LAB — ANTI-SCLERODERMA ANTIBODY: SCLERODERMA (SCL-70) (ENA) ANTIBODY, IGG: NEGATIVE AI

## 2018-02-04 LAB — ANCA SCREEN W REFLEX TITER: ANCA Screen: NEGATIVE

## 2018-02-04 LAB — RHEUMATOID FACTOR

## 2018-02-04 LAB — CYCLIC CITRUL PEPTIDE ANTIBODY, IGG

## 2018-02-04 LAB — ANTI-DNA ANTIBODY, DOUBLE-STRANDED: ds DNA Ab: <1 IU/mL

## 2018-02-05 LAB — HYPERSENSITIVITY PNEUMONITIS
A. PULLULANS ABS: NEGATIVE
A.Fumigatus #1 Abs: NEGATIVE
Micropolyspora faeni, IgG: NEGATIVE
PIGEON SERUM ABS: NEGATIVE
THERMOACT. SACCHARII: NEGATIVE
Thermoactinomyces vulgaris, IgG: NEGATIVE

## 2018-02-09 ENCOUNTER — Encounter (HOSPITAL_COMMUNITY): Payer: Self-pay | Admitting: Respiratory Therapy

## 2018-02-09 ENCOUNTER — Ambulatory Visit (HOSPITAL_COMMUNITY)
Admission: RE | Admit: 2018-02-09 | Discharge: 2018-02-09 | Disposition: A | Discharge status: Home / Self Care | Payer: Medicaid Other | Source: Ambulatory Visit | Attending: Internal Medicine | Admitting: Internal Medicine

## 2018-02-09 ENCOUNTER — Encounter (HOSPITAL_COMMUNITY)
Admission: RE | Disposition: A | Discharge status: Home / Self Care | Payer: Self-pay | Source: Ambulatory Visit | Attending: Internal Medicine

## 2018-02-09 DIAGNOSIS — K3184 Gastroparesis: Secondary | ICD-10-CM | POA: Diagnosis not present

## 2018-02-09 DIAGNOSIS — E1143 Type 2 diabetes mellitus with diabetic autonomic (poly)neuropathy: Secondary | ICD-10-CM | POA: Insufficient documentation

## 2018-02-09 DIAGNOSIS — Z79899 Other long term (current) drug therapy: Secondary | ICD-10-CM | POA: Insufficient documentation

## 2018-02-09 DIAGNOSIS — Z794 Long term (current) use of insulin: Secondary | ICD-10-CM | POA: Diagnosis not present

## 2018-02-09 DIAGNOSIS — E785 Hyperlipidemia, unspecified: Secondary | ICD-10-CM | POA: Insufficient documentation

## 2018-02-09 DIAGNOSIS — J8410 Pulmonary fibrosis, unspecified: Secondary | ICD-10-CM | POA: Diagnosis present

## 2018-02-09 DIAGNOSIS — F17210 Nicotine dependence, cigarettes, uncomplicated: Secondary | ICD-10-CM | POA: Insufficient documentation

## 2018-02-09 DIAGNOSIS — J45909 Unspecified asthma, uncomplicated: Secondary | ICD-10-CM | POA: Diagnosis not present

## 2018-02-09 DIAGNOSIS — J849 Interstitial pulmonary disease, unspecified: Secondary | ICD-10-CM

## 2018-02-09 DIAGNOSIS — K219 Gastro-esophageal reflux disease without esophagitis: Secondary | ICD-10-CM | POA: Diagnosis not present

## 2018-02-09 HISTORY — PX: VIDEO BRONCHOSCOPY: SHX5072

## 2018-02-09 LAB — BODY FLUID CELL COUNT WITH DIFFERENTIAL
EOS FL: 23 %
LYMPHS FL: 2 %
Monocyte-Macrophage-Serous Fluid: 12 % — ABNORMAL LOW (ref 50–90)
NEUTROPHIL FLUID: 63 % — AB (ref 0–25)
WBC FLUID: 165 uL (ref 0–1000)

## 2018-02-09 LAB — GLUCOSE, CAPILLARY: GLUCOSE-CAPILLARY: 104 mg/dL — AB (ref 70–99)

## 2018-02-09 SURGERY — VIDEO BRONCHOSCOPY WITHOUT FLUORO
Anesthesia: Moderate Sedation | Laterality: Bilateral

## 2018-02-09 MED ORDER — LIDOCAINE HCL 2 % EX GEL
1.0000 "application " | Freq: Once | CUTANEOUS | Status: DC
  Filled 2018-02-09: qty 5

## 2018-02-09 MED ORDER — LIDOCAINE HCL 1 % IJ SOLN
INTRAMUSCULAR | Status: DC | PRN
  Administered 2018-02-09: 6 mL via RESPIRATORY_TRACT

## 2018-02-09 MED ORDER — MIDAZOLAM HCL 10 MG/2ML IJ SOLN
INTRAMUSCULAR | Status: DC | PRN
  Administered 2018-02-09: 1 mg via INTRAVENOUS
  Administered 2018-02-09: 2 mg via INTRAVENOUS

## 2018-02-09 MED ORDER — BUTAMBEN-TETRACAINE-BENZOCAINE 2-2-14 % EX AERO: 1.0000 | INHALATION_SPRAY | Freq: Once | CUTANEOUS | Status: DC

## 2018-02-09 MED ORDER — FENTANYL CITRATE (PF) 100 MCG/2ML IJ SOLN
INTRAMUSCULAR | Status: DC | PRN
  Administered 2018-02-09 (×3): 25 ug via INTRAVENOUS

## 2018-02-09 MED ORDER — MIDAZOLAM HCL 5 MG/ML IJ SOLN
INTRAMUSCULAR | Status: AC
  Filled 2018-02-09: qty 2

## 2018-02-09 MED ORDER — LIDOCAINE HCL URETHRAL/MUCOSAL 2 % EX GEL
CUTANEOUS | Status: DC | PRN
  Administered 2018-02-09: 1

## 2018-02-09 MED ORDER — INSULIN GLARGINE 100 UNIT/ML SOLOSTAR PEN: 100.0000 [IU] | PEN_INJECTOR | Freq: Every day | SUBCUTANEOUS | 0 refills | Status: DC

## 2018-02-09 MED ORDER — PHENYLEPHRINE HCL 0.25 % NA SOLN
NASAL | Status: DC | PRN
  Administered 2018-02-09: 2 via NASAL

## 2018-02-09 MED ORDER — PHENYLEPHRINE HCL 0.25 % NA SOLN: 1.0000 | Freq: Four times a day (QID) | NASAL | Status: DC | PRN

## 2018-02-09 MED ORDER — INSULIN GLARGINE 100 UNIT/ML SOLOSTAR PEN: PEN_INJECTOR | SUBCUTANEOUS | 0 refills | Status: DC

## 2018-02-09 MED ORDER — FENTANYL CITRATE (PF) 100 MCG/2ML IJ SOLN
INTRAMUSCULAR | Status: AC
  Filled 2018-02-09: qty 4

## 2018-02-09 MED ORDER — SODIUM CHLORIDE 0.9 % IV SOLN
INTRAVENOUS | Status: DC
  Administered 2018-02-09: 10:00:00 via INTRAVENOUS

## 2018-02-09 NOTE — Interval H&P Note (Signed)
Interim hx  - NPO confirmed. Sugar 101. No new complaints. Vital stable - pulse ox on arrival 92-94% on room air. Lung exam crackles. Fully ooriented. Moes all 4s. abd soft. Normal heart sounds  LABS - ESR > 100, Autoimmune/vasculitis  - negative  A - ILD NOS  P Risks of pneumothorax, hemothorax, sedation/anesthesia complications such as cardiac or respiratory arrest or hypotension, stroke and bleeding all explained. Benefits of diagnosis but limitations of non-diagnosis also explained. Patient verbalized understanding and wished to proceed.       SIGNATURE    Dr. Brand Males, M.D., F.C.C.P,  Pulmonary and Critical Care Medicine Staff Physician, [place] Director - Interstitial Lung Disease  Program  Pulmonary [place] at [place], [place], [address]  Pager: [phone], If no answer or between  15:00h - 7:00h: call [phone] Telephone: [phone]  11:17 AM 02/09/2018

## 2018-02-09 NOTE — Progress Notes (Signed)
Video Bronchoscopy done Intervention Bronchial washing done Procedure tolerated well 

## 2018-02-09 NOTE — Discharge Instructions (Signed)
Flexible Bronchoscopy, Care After These instructions give you information on caring for yourself after your procedure. Your doctor may also give you more specific instructions. Call your doctor if you have any problems or questions after your procedure. Follow these instructions at home:  Do not eat or drink anything for 2 hours after your procedure. If you try to eat or drink before the medicine wears off, food or drink could go into your lungs. You could also burn yourself.  After 2 hours have passed and when you can cough and gag normally, you may eat soft food and drink liquids slowly.  The day after the test, you may eat your normal diet.  You may do your normal activities.  Keep all doctor visits. Get help right away if:  You get more and more short of breath.  You get light-headed.  You feel like you are going to pass out (faint).  You have chest pain.  You have new problems that worry you.  You cough up more than a little blood.  You cough up more blood than before. This information is not intended to replace advice given to you by your health care provider. Make sure you discuss any questions you have with your health care provider. Document Released: 03/16/2009 Document Revised: 10/25/2015 Document Reviewed: 01/21/2013 Elsevier Interactive Patient Education  2017 [place].  Nothing to eat drink until   1:30  pm  today   02/10/2016 Any Questions or concerns please call  [phone]    FOLLOWUP Future Appointments  Date Time Provider [place]  02/16/2018 12:00 PM Parrett, Fonnie Mu, NP [facility] None

## 2018-02-09 NOTE — Op Note (Addendum)
Name:  [name] MRN:  [id number] DOB:  10/16/1975  PROCEDURE NOTE  Procedure(s): Flexible bronchoscopy [phone]) Bronchial alveolar lavage [phone]) of the right middle lobe   Indications:  ILD, Cough, alveolar infiltrates  Consent:  Procedure, benefits, risks and alternatives discussed.  Questions answered.  Consent obtained.  Anesthesia:  Moderate Sedation  Location:  [facility] bronch suite  Procedure summary:  Appropriate equipment was assembled.  The patient was brought to the procedure suite room and identified as [name] with [birth year]  Safety timeout was performed. The patient was placed supine on the  table, airway and moderate sedation administered by this operator  After the appropriate level of moderation was assured, flexible video bronchoscope was lubricated and inserted through the endotracheal tube.  Total of 25 mL of 1% Lidocaine were administered through the bronchoscope to augment moderate sedation. Versed 71m, Fentanyl 740m  Airway examination was only performed at carina, trachea, RMB to subsegmental level.  Minimal clear secretions were noted, mucosa appeared normal and no endobronchial lesions were identified.  Bronchial alveolar lavage of the right middle  was performed with 100 mL of normal saline in 3 aliquots Total return of 30-40 mL of fluid - cloudy, grey, with mucus after which the bronchoscope was withdrawn.      After hemostasis was assure, the bronchoscope was withdrawn.  The patient was recovered and then  transferred to recovery area  Post-procedure chest x-ray was not needed so not ordered.  Specimens sent: Bronchial alveolar lavage specimen of the right middle  for cell count), microbiology and cytology.  Complications:  No immediate complications were noted.  Hemodynamic parameters and oxygenation remained stable throughout the procedure.  Estimated blood loss:  none  IMPRESSION 1. RML BAL 2. Negatie seriology but  high ESR > 100  PLAN  - if infection negative, then have to decide between empiric steroids (he is DM-2 and needs coordination with endocrine) v surgical lung bx  - elicit GERD, aspiration hx esp wit DM hx at time of followup  Followup Future Appointments  Date Time Provider [place]9/17/2019 12:00 PM Parrett, TaFonnie MuNP [facility] None     Dr. MuBrand MalesM.D., F.[facility].P Pulmonary and Critical Care Medicine Staff Physician [facility] and Critical Care Pager: [phone] no answer or between  15:00h - 7:00h: call [phone]  02/09/2018 11:33 AM

## 2018-02-10 LAB — PNEUMOCYSTIS JIROVECI SMEAR BY DFA: PNEUMOCYSTIS JIROVECI AG: NEGATIVE

## 2018-02-11 ENCOUNTER — Encounter (HOSPITAL_COMMUNITY): Payer: Self-pay | Admitting: Internal Medicine

## 2018-02-11 ENCOUNTER — Encounter (HOSPITAL_COMMUNITY): Payer: Medicaid Other

## 2018-02-11 LAB — CULTURE, BAL-QUANTITATIVE W GRAM STAIN: Culture: 40000 — AB

## 2018-02-11 LAB — ACID FAST SMEAR (AFB, MYCOBACTERIA): Acid Fast Smear: NEGATIVE

## 2018-02-11 LAB — CULTURE, BAL-QUANTITATIVE

## 2018-02-12 LAB — MTB NAA WITHOUT AFB CULTURE: M Tuberculosis, Naa: NEGATIVE

## 2018-02-15 ENCOUNTER — Telehealth: Payer: Self-pay | Admitting: Internal Medicine

## 2018-02-15 NOTE — Telephone Encounter (Signed)
[name]  You are seeing 02/16/18 [name] for BAL results. Shows eosinophlia and neutrophilia. Prelim culture negative. Autoimmune negative. ESR 130.   Also ONO 02/04/18 - < 88% at 9.5 hours  A Eosinophilic pneumonia  Plan - start night o2 - consider ex o2  Depending on walk test - see if any wet exposure hx  -pleas review some of his meds for eos elevation in side effect  - needs prednisone starting 23m and over 3 months (he is diabetic and call to his diabetologist needed)  Thanks    SIGNATURE    Dr. MBrand Males M.D., F.C.C.P,  Pulmonary and Critical Care Medicine Staff Physician, CMany FarmsDirector - Interstitial Lung Disease  Program  [place] [id number] Pager: [phone] If no answer or between  15:00h - 7:00h: call [phone] Telephone: [phone]  4:22 PM 02/15/2018    Results for BAVNER, STRODER(MRN [id number] as of 02/15/2018 16:20  Ref. Range 02/09/2018 11:27  Monocyte-Macrophage-Serous Fluid Latest Ref Range: 50 - 90 % 12 (L)  Other Cells, Fluid Latest Units: % CORRELATE WITH CYTOLOGY.  Fluid Type-FCT Unknown BRONCHIAL ALVEOLAR LAVAGE  Color, Fluid Latest Ref Range: YELLOW  COLORLESS (A)  WBC, Fluid Latest Ref Range: 0 - 1,000 cu mm 165  Lymphs, Fluid Latest Units: % 2  Eos, Fluid Latest Units: % 23  Appearance, Fluid Latest Ref Range: CLEAR  CLEAR  Neutrophil Count, Fluid Latest Ref Range: 0 - 25 % 63 (H)

## 2018-02-16 ENCOUNTER — Other Ambulatory Visit: Payer: Self-pay | Admitting: Internal Medicine

## 2018-02-16 ENCOUNTER — Ambulatory Visit (INDEPENDENT_AMBULATORY_CARE_PROVIDER_SITE_OTHER): Payer: Medicaid Other | Admitting: Adult Health

## 2018-02-16 ENCOUNTER — Other Ambulatory Visit (INDEPENDENT_AMBULATORY_CARE_PROVIDER_SITE_OTHER): Payer: Medicaid Other

## 2018-02-16 ENCOUNTER — Telehealth: Payer: Self-pay | Admitting: Adult Health

## 2018-02-16 ENCOUNTER — Encounter: Payer: Self-pay | Admitting: Adult Health

## 2018-02-16 VITALS — BP 146/88 | HR 109 | Ht 72.5 in | Wt 209.8 lb

## 2018-02-16 DIAGNOSIS — J9611 Chronic respiratory failure with hypoxia: Secondary | ICD-10-CM | POA: Insufficient documentation

## 2018-02-16 DIAGNOSIS — J82 Pulmonary eosinophilia, not elsewhere classified: Secondary | ICD-10-CM

## 2018-02-16 DIAGNOSIS — J849 Interstitial pulmonary disease, unspecified: Secondary | ICD-10-CM

## 2018-02-16 DIAGNOSIS — J8281 Chronic eosinophilic pneumonia: Secondary | ICD-10-CM | POA: Insufficient documentation

## 2018-02-16 LAB — CBC WITH DIFFERENTIAL/PLATELET
BASOS PCT: 1.4 % (ref 0.0–3.0)
Basophils Absolute: 0.2 10*3/uL — ABNORMAL HIGH (ref 0.0–0.1)
EOS PCT: 7.2 % — AB (ref 0.0–5.0)
Eosinophils Absolute: 0.9 10*3/uL — ABNORMAL HIGH (ref 0.0–0.7)
HEMATOCRIT: 34.4 % — AB (ref 39.0–52.0)
HEMOGLOBIN: 11.6 g/dL — AB (ref 13.0–17.0)
LYMPHS PCT: 16.2 % (ref 12.0–46.0)
Lymphs Abs: 2.1 10*3/uL (ref 0.7–4.0)
MCHC: 33.6 g/dL (ref 30.0–36.0)
MCV: 79.9 fl (ref 78.0–100.0)
MONO ABS: 1.1 10*3/uL — AB (ref 0.1–1.0)
MONOS PCT: 8.7 % (ref 3.0–12.0)
Neutro Abs: 8.6 10*3/uL — ABNORMAL HIGH (ref 1.4–7.7)
Neutrophils Relative %: 66.5 % (ref 43.0–77.0)
Platelets: 353 10*3/uL (ref 150.0–400.0)
RBC: 4.3 Mil/uL (ref 4.22–5.81)
RDW: 16.4 % — AB (ref 11.5–15.5)
WBC: 12.9 10*3/uL — ABNORMAL HIGH (ref 4.0–10.5)

## 2018-02-16 MED ORDER — PREDNISONE 20 MG PO TABS: 60.0000 mg | ORAL_TABLET | Freq: Every day | ORAL | 0 refills | Status: DC

## 2018-02-16 NOTE — Assessment & Plan Note (Signed)
ONO positive for desats, ambulatory desats on room air  Begin O2 2l/m with act and At bedtime

## 2018-02-16 NOTE — Assessment & Plan Note (Signed)
LD -BAL and Peripheral CBC show eosinophilia - consistent with idiopathic Eosinphilic PNA . Check CBC w/ diff and IgE  Long discussion with patient regarding his test results.  And treatment options.  Patient will need to begin prednisone.  Unfortunately patient has underlying diabetes that is insulin-dependent along with bipolar disorder which steroids can have undesirable side effects especially with these particular disorders.  Have advised him to contact his endocrinologist and advised them that he is beginning steroids.  Have also advised him to check blood sugars more often and report if blood sugars are greater than 250.  Last A1c was 5.9.  Blood sugars average around 104 at home. Patient does have underlying bipolar disorder he says he is very well controlled.  Of advised if he has increased depression or manic type feelings that he is to report immediately.  He is also to contact his primary care physician for a follow-up visit which he has in 1 week to discuss that steroids will be started.  He will follow back in our office in a very short period of time for follow-up.  Hopefully he will have a good clinical response and we can taper off steroids as soon as possible. Extensive patient education on steroids given.  Plan  Patient Instructions  Begin Prednisone 63m daily with food.  Begin Oxygen 2l/m with activity and At bedtime  .  Need follow up with Dr. ELoanne Drillingfor Diabetes . If your blood sugar is >250 please call our office or Dr. ELoanne Drillingoffice .  Follow up with Dr. RChase Calleror Parrett NP in 2 weeks and As needed   Labs today .  Please contact office for sooner follow up if symptoms do not improve or worsen or seek emergency care

## 2018-02-16 NOTE — Progress Notes (Addendum)
_0  ID: [name], male    DOB: 1976-05-24, 42 y.o.   MRN: [id number]  Chief Complaint  Patient presents with  . Follow-up    ILD     Referring provider: Sinda Du, MD  HPI: 42 year old male active smoker seen for pulmonary consult February 02, 2018 for recurrent pneumonia .  Medical history significant for insulin-dependent diabetes and bipolar disorder    02/16/2018 Follow up : ILD , Bronch results  Patient presents for a 2-week follow-up.  Patient was seen last visit for a pulmonary consult.  Patient is a smoker.  He has a medical history significant for insulin-dependent diabetes and bipolar disorder.  Patient noticed in January of this year a cough shortness of breath and was diagnosed with pneumonia.  He continued to have progressive symptoms.  Was treated by his primary care physician Dr. Luan Pulling for pneumonia with antibiotics.  He had 2-3 courses of antibiotics and prednisone with only temporary improvement.  Chest x-ray showed chronic interstitial changes.  Patient underwent a CT chest on July 2019 that showed mild lymphadenopathy.  Diffuse chronic appearing interstitial lung disease with mild bronchiectatic changes. Patient does smoke cigarettes.  He has occasional use of marijuana but is not currently using.  He has never used cocaine heroin.  No alcohol.  He is disabled since his early 37s from diabetes and bipolar disorder.. Over the last 9 months breathing has progressively worsened with increased shortness of breath.  He does not have any significant travel history.  He has no pets.  He has no basement.  No hot tub.  No known exposure to birds or chickens.  Patient did have mold exposure in a previous apartment that he lived in for about 2 years.  Has been out of this apartment for 3 months.  In his current apartment he does not notice any obvious mold.  He does not use nonsteroidals. Patient began a work-up for interstitial lung disease.  Autoimmune and  connective tissue was essentially negative except for an elevated sed rate at 130.  Patient underwent bronchoscopy On February 09, 2018 results showed negative preliminary cultures.  BAL results showed eosinophilia and neutrophilia.  Patient had an overnight oximetry test that showed desaturations.  Walk test in the office shows desaturations with ambulation at 86% on room air.O2 saturation at rest 95%. We reviewed all his test results.  Questions were answered.  No Known Allergies  Immunization History  Administered Date(s) Administered  . Influenza,inj,Quad PF,6+ Mos 06/06/2013    Past Medical History:  Diagnosis Date  . Bipolar disorder ([place])   . Diabetes mellitus without complication ([place])   . Hyperlipidemia   . Pancreatitis     Tobacco History: Social History   Tobacco Use  Smoking Status Current Every Day Smoker  . Packs/day: 0.50  . Years: 10.00  . Pack years: 5.00  . Types: Cigarettes  Smokeless Tobacco Never Used   Ready to quit: No Counseling given: Yes   Outpatient Medications Prior to Visit  Medication Sig Dispense Refill  . amoxicillin (AMOXIL) 500 MG capsule Take 500 mg by mouth at bedtime.    . divalproex (DEPAKOTE) 500 MG DR tablet Take 1,000 mg by mouth at bedtime.    . insulin aspart (NOVOLOG FLEXPEN) 100 UNIT/ML FlexPen Inject 30 Units 2 (two) times daily with a meal into the skin. 30 mL 11  . lisinopril (PRINIVIL,ZESTRIL) 20 MG tablet Take 20 mg by mouth at bedtime.    Marland Kitchen omeprazole (PRILOSEC) 20 MG  capsule Take 20 mg by mouth at bedtime.    . risperidone (RISPERDAL) 4 MG tablet Take 4 mg by mouth at bedtime.     . sildenafil (VIAGRA) 100 MG tablet Take 0.5-1 tablets (50-100 mg total) daily as needed by mouth for erectile dysfunction. 30 tablet 11  . simvastatin (ZOCOR) 20 MG tablet Take 20 mg by mouth at bedtime.    . solifenacin (VESICARE) 5 MG tablet Take 5 mg by mouth at bedtime.     . Insulin Glargine (LANTUS) 100 UNIT/ML Solostar Pen patinet self  titrates - most recent dose is 100 units in morning 1 pen 0   No facility-administered medications prior to visit.      Review of Systems  Constitutional:   No  weight loss, night sweats,  Fevers, chills,  +fatigue, or  lassitude.  HEENT:   No headaches,  Difficulty swallowing,  Tooth/dental problems, or  Sore throat,                No sneezing, itching, ear ache, nasal congestion, post nasal drip,   CV:  No chest pain,  Orthopnea, PND, swelling in lower extremities, anasarca, dizziness, palpitations, syncope.   GI  No heartburn, indigestion, abdominal pain, nausea, vomiting, diarrhea, change in bowel habits, loss of appetite, bloody stools.   Resp:    No chest wall deformity  Skin: no rash or lesions.  GU: no dysuria, change in color of urine, no urgency or frequency.  No flank pain, no hematuria   MS:  No joint pain or swelling.  No decreased range of motion.  No back pain.    Physical Exam  BP (!) 146/88 (BP Location: Left Arm, Cuff Size: Normal)   Pulse (!) 109   Ht 6' 0.5" (1.842 m)   Wt 209 lb 12.8 oz (95.2 kg)   SpO2 95%   BMI 28.06 kg/m   GEN: A/Ox3; pleasant , NAD, well nourished    HEENT:  [place]/AT,  EACs-clear, TMs-wnl, NOSE-clear, THROAT-clear, no lesions, no postnasal drip or exudate noted. Dental caries noted   NECK:  Supple w/ fair ROM; no JVD; normal carotid impulses w/o bruits; no thyromegaly or nodules palpated; no lymphadenopathy.    RESP  BB crackles . no accessory muscle use, no dullness to percussion  CARD:  RRR, no m/r/g, no peripheral edema, pulses intact, no cyanosis or clubbing.  GI:   Soft & nt; nml bowel sounds; no organomegaly or masses detected.   Musco: Warm bil, no deformities or joint swelling noted.   Neuro: alert, no focal deficits noted.    Skin: Warm, no lesions or rashes    Lab Results:  CBC  No results found for: BNP  ProBNP No results found for: PROBNP  Imaging: No results found.   Assessment & Plan:   ILD  (interstitial lung disease) ([place]) ILD -BAL and Peripheral CBC show eosinophilia - consistent with idiopathic Eosinphilic PNA  Long discussion with patient regarding his test results.  And treatment options.  Patient will need to begin prednisone.  Unfortunately patient has underlying diabetes that is insulin-dependent along with bipolar disorder which steroids can have undesirable side effects especially with these particular disorders.  Have advised him to contact his endocrinologist and advised them that he is beginning steroids.  Have also advised him to check blood sugars more often and report if blood sugars are greater than 250.  Last A1c was 5.9.  Blood sugars average around 104 at home. Patient does have underlying bipolar  disorder he says he is very well controlled.  Of advised if he has increased depression or manic type feelings that he is to report immediately.  He is also to contact his primary care physician for a follow-up visit which he has in 1 week to discuss that steroids will be started.  He will follow back in our office in a very short period of time for follow-up.  Hopefully he will have a good clinical response and we can taper off steroids as soon as possible. Extensive patient education on steroids given.  Plan  Patient Instructions  Begin Prednisone 59m daily with food.  Begin Oxygen 2l/m with activity and At bedtime  .  Need follow up with Dr. ELoanne Drillingfor Diabetes . If your blood sugar is >250 please call our office or Dr. ELoanne Drillingoffice .  Follow up with Dr. RChase Calleror Jouri Threat NP in 2 weeks and As needed   Labs today .  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 Check CBC w/ diff and IgE  Long discussion with patient regarding his test results.  And treatment options.  Patient  will need to begin prednisone.  Unfortunately patient has underlying diabetes that is insulin-dependent along with bipolar disorder which steroids can have undesirable side effects especially with these particular disorders.  Have advised him to contact his endocrinologist and advised them that he is beginning steroids.  Have also advised him to check blood sugars more often and report if blood sugars are greater than 250.  Last A1c was 5.9.  Blood sugars average around 104 at home. Patient does have underlying bipolar disorder he says he is very well controlled.  Of advised if he has increased depression or manic type feelings that he is to report immediately.  He is also to contact his primary care physician for a follow-up visit which he has in 1 week to discuss that steroids will be started.  He will follow back in our office in a very short period of time for follow-up.  Hopefully he will have a good clinical response and we can taper off steroids as soon as possible. Extensive patient education on steroids given.  Plan  Patient Instructions  Begin Prednisone 631mdaily with food.  Begin Oxygen 2l/m with activity and At bedtime  .  Need follow up with Dr. ElLoanne Drillingor Diabetes . If your blood sugar is >250 please call our office or Dr. ElLoanne Drillingffice .  Follow up with Dr. RaChase Callerr Alijah Akram NP in 2 weeks and As needed   Labs today .  Please contact office for sooner follow up if symptoms do not improve or worsen or seek emergency care        Chronic respiratory failure with hypoxia ([place] positive for desats, ambulatory desats on room air  Begin O2 2l/m with act and At bedtime       TaRexene EdisonNP 02/16/2018

## 2018-02-16 NOTE — Patient Instructions (Signed)
Begin Prednisone 60mg  daily with food.  Begin Oxygen 2l/m with activity and At bedtime  .  Need follow up with Dr. Loanne Drilling for Diabetes . If your blood sugar is >250 please call our office or Dr. Loanne Drilling office .  Follow up with Dr. Chase Caller or Ramsey Guadamuz NP in 2 weeks and As needed   Labs today .  Please contact office for sooner follow up if symptoms do not improve or worsen or seek emergency care

## 2018-02-16 NOTE — Assessment & Plan Note (Signed)
ILD -BAL and Peripheral CBC show eosinophilia - consistent with idiopathic Eosinphilic PNA  Long discussion with patient regarding his test results.  And treatment options.  Patient will need to begin prednisone.  Unfortunately patient has underlying diabetes that is insulin-dependent along with bipolar disorder which steroids can have undesirable side effects especially with these particular disorders.  Have advised him to contact his endocrinologist and advised them that he is beginning steroids.  Have also advised him to check blood sugars more often and report if blood sugars are greater than 250.  Last A1c was 5.9.  Blood sugars average around 104 at home. Patient does have underlying bipolar disorder he says he is very well controlled.  Of advised if he has increased depression or manic type feelings that he is to report immediately.  He is also to contact his primary care physician for a follow-up visit which he has in 1 week to discuss that steroids will be started.  He will follow back in our office in a very short period of time for follow-up.  Hopefully he will have a good clinical response and we can taper off steroids as soon as possible. Extensive patient education on steroids given.  Plan  Patient Instructions  Begin Prednisone 51m daily with food.  Begin Oxygen 2l/m with activity and At bedtime  .  Need follow up with Dr. ELoanne Drillingfor Diabetes . If your blood sugar is >250 please call our office or Dr. ELoanne Drillingoffice .  Follow up with Dr. RChase Calleror Lamarius Dirr NP in 2 weeks and As needed   Labs today .  Please contact office for sooner follow up if symptoms do not improve or worsen or seek emergency care

## 2018-02-16 NOTE — Telephone Encounter (Signed)
I called [place] but there was no answer. Left message on machine for someone to call back.

## 2018-02-16 NOTE — Telephone Encounter (Signed)
Awaiting form to be faxed over. Will route to Jessica/[name] as FYI.

## 2018-02-16 NOTE — Telephone Encounter (Signed)
Erica with [employer] returning call, CB is [phone].  States TP faxed over RX but it is not a good RX because patient only needs O2 at night.  She's going to fax over another RX and requests someone sign off on it.  States it will have Dr. Golden Pop name on it but TP can cross his name out and sign above it.

## 2018-02-17 LAB — IGE: IgE (Immunoglobulin E), Serum: 369 kU/L — ABNORMAL HIGH (ref <=114)

## 2018-02-17 NOTE — Telephone Encounter (Signed)
Form was placed on my desk Form has been signed by TP and faxed back to [place] Thank you

## 2018-02-17 NOTE — Telephone Encounter (Signed)
[facility] is calling [place] back  [phone]

## 2018-02-17 NOTE — Telephone Encounter (Signed)
I checked in MR's and TP's lookat and can not locate the form  I called and LMTCB for [place] ap[facility]  We can ask them to send it to triage fax to ensure this gets handled  Will await call back

## 2018-02-17 NOTE — Telephone Encounter (Signed)
Called and spoke with [name], advised her that we have not received the form. Triage fax number given and will await forms.

## 2018-02-18 NOTE — Telephone Encounter (Signed)
Called patient unable to reach left message to give us a call back.

## 2018-02-18 NOTE — Telephone Encounter (Signed)
LMOM TCB x1 for [facility] to see if the form was received

## 2018-02-18 NOTE — Telephone Encounter (Signed)
[facility] has received the O2 order from [place] and he is scheduled to be set up. [phone]

## 2018-02-19 NOTE — Telephone Encounter (Signed)
Left message informing patient, [name] would be providing him with oxygen. Nothing further needed at this time.

## 2018-02-23 ENCOUNTER — Telehealth: Payer: Self-pay | Admitting: Internal Medicine

## 2018-02-23 NOTE — Telephone Encounter (Signed)
Notes recorded by Melvenia Needles, NP on 02/18/2018 at 2:15 PM EDT Labs show elevated eosinophils , cont w/ prednisone . How is he doing .  Did he make ov with Dr. Loanne Drilling for his DM . Make sure blood sugars are okay on steroids  Please contact office for sooner follow up if symptoms do not improve or worsen or seek emergency care  ------------------------------------------------------------------------------ Attempted to call pt. I did not receive an answer. I have left a message for pt to return our call.

## 2018-02-24 NOTE — Telephone Encounter (Signed)
Spoke with pt. He is aware of results. Nothing further was needed. 

## 2018-03-01 NOTE — Progress Notes (Signed)
_0  ID: [name], male    DOB: Sep 25, 1975, 42 y.o.   MRN: [id number]  Chief Complaint  Patient presents with  . Follow-up    ILD follow-up / Eosinophil PNU     Referring provider: Sinda Du, MD  HPI:  42 year old male every day smoker initially seen for pulmonary care consult on 02/02/2018.  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 Chase Caller   Recent [place] [facility] Encounters:   02/16/2018-office visit- TP Patient presents for two-week follow-up visit after pulmonary care consult on bronchoscopy.  Patient is an every day smoker.  Patient underwent CT of chest in July/2019 that showed mild lymphadenopathy.  Diffuse chronic appearing interstitial lung disease with mild bronchiectatic changes.  Patient with occasional marijuana use.  Denies cocaine and heroin. Patient began a work-up for interstitial lung disease.  Autoimmune and connective tissue was essentially negative except for an elevated sed rate at 130.  Patient underwent bronchoscopy. On February 09, 2018 results showed negative preliminary cultures.  BAL results showed eosinophilia and neutrophilia.  Patient had an overnight oximetry test that showed desaturations.  Walk test in the office shows desaturations with ambulation at 86% on room air.O2 saturation at rest 95%. Plan: Begin prednisone 60 mg daily with food, begin 2 L of oxygen with activity and at bedtime, follow-up with Dr. Loanne Drilling for diabetes.  If your blood sugars are greater than 250 please call our office or Dr. Cordelia Pen office.  Follow-up with Dr. Chase Caller or TP in 2 weeks and as needed..    03/02/2018  - Visit   42 year old male every day smoker presenting today for follow-up for eosinophilic pneumonia.  Patient reports adherence to 60 mg of prednisone  daily and feels that his breathing has significantly improved since starting prednisone.  Unfortunately patient is still smoking 1 to 2 cigarettes a day.  Patient reports that he typically wears his oxygen at night but will take it off occasionally as he sleeps because he does not want his oxygen to "blow up or overheat".  Patient reports that his blood sugars have been "okay".  Fasting blood sugar today 143.  This was prior to taking prednisone or eating.  Patient has not followed up with Dr. Loanne Drilling or Dr. Luan Pulling regarding blood sugars as he was previously instructed at 02/16/2018 office visit with TP.  Patient reports that he "forgot to schedule the appointment".  Patient denied any sort of acute symptoms today.  Patient reports that his shortness of breath has improved.  No other complaints today.   Tests:    02/16/2018-CBC with differential- eosinophils 7.2, neutrophils 8.6, eosinophils absolute 0.9 02/16/2018-IgE 369 02/09/2018- culture- Candida albicans 02/09/2018-AFB-negative 02/09/2018- BAL- neutrophil count 63, eosinophils 23 02/09/2018- pathology report-bronchial lavage-benign reactive reparative changes, acute inflammation 02/02/2018-sed rate-130 02/02/2018 anti-DNA, ANA, rheumatoid factor, CCP, anti-scleroderma, ANCA, CK-MB, hypersensitivity pneumonitis panel -negative, normal   12/02/17-CT chest without contrast- no evidence of pulmonary consolidation or pleural effusion, diffuse chronic appearing interstitial lung disease is seen throughout both lungs with mild bronchiectasis, no significant subpleural honeycombing, similar findings were seen on images through lung bases abdomen CT of 05/28/2013 >>>Mild bilateral mediastinal lymphadenopathy >>>Consider high-res lung CT for further evaluation   Chart Review:  Pulmonary function test>>>   Specialty Problems      Pulmonary Problems   ILD (interstitial lung disease) ([place])  02/16/2018-CBC with differential- eosinophils 7.2, neutrophils  8.6, eosinophils absolute 0.9 02/16/2018-IgE 369 02/09/2018- culture- Candida albicans 02/09/2018-AFB-negative 02/09/2018- BAL- neutrophil count 63, eosinophils 23 02/09/2018- pathology report-bronchial lavage-benign reactive reparative changes, acute inflammation 02/02/2018-sed rate-130 02/02/2018 anti-DNA, ANA, rheumatoid factor, CCP, anti-scleroderma, ANCA, CK-MB, hypersensitivity pneumonitis panel -negative, normal   12/02/17-CT chest without contrast- no evidence of pulmonary consolidation or pleural effusion, diffuse chronic appearing interstitial lung disease is seen throughout both lungs with mild bronchiectasis, no significant subpleural honeycombing, similar findings were seen on images through lung bases abdomen CT of 05/28/2013 >>>Mild bilateral mediastinal lymphadenopathy >>>Consider high-res lung CT for further evaluation  Pulmonary function test>>>      Chronic respiratory failure with hypoxia (HCC)    02/16/18>>> Patient had an overnight oximetry test that showed desaturations.  Walk test in the office shows desaturations with ambulation at 86% on room air.O2 saturation at rest 95%.      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[place])    02/16/2018-CBC with differential- eosinophils 7.2, neutrophils 8.6, eosinophils absolute 0.9 02/16/2018-IgE 369 02/09/2018- culture- Candida albicans 02/09/2018-AFB-negative 02/09/2018- BAL- neutrophil count 63, eosinophils 23 02/09/2018- pathology report-bronchial lavage-benign reactive reparative changes, acute inflammation 02/02/2018-sed rate-130 02/02/2018 anti-DNA, ANA, rheumatoid factor, CCP, anti-scleroderma, ANCA, CK-MB, hypersensitivity pneumonitis panel -negative, normal  12/02/17-CT chest without contrast- no evidence of pulmonary consolidation or pleural effusion, diffuse chronic appearing interstitial lung disease is seen throughout both lungs with mild bronchiectasis, no significant subpleural honeycombing, similar findings were seen on images through lung bases  abdomen CT of 05/28/2013 >>>Mild bilateral mediastinal lymphadenopathy >>>Consider high-res lung CT for further evaluation  Pulmonary function test>>>         No Known Allergies  Immunization History  Administered Date(s) Administered  . Influenza,inj,Quad PF,6+ Mos 06/06/2013    Past Medical History:  Diagnosis Date  . Bipolar disorder ([place])   . Diabetes mellitus without complication ([place])   . Hyperlipidemia   . Pancreatitis     Tobacco History: Social History   Tobacco Use  Smoking Status Current Some Day Smoker  . Packs/day: 0.50  . Years: 10.00  . Pack years: 5.00  . Types: Cigarettes  Smokeless Tobacco Never Used   Ready to quit: Yes Counseling given: Yes  Patient reports that he still smoking 1 to 2 cigarettes in the morning every day.  Patient is willing to try to stop smoking.  Smoking assessment and cessation counseling  Patient currently smoking: 1-2 cigarettes a day I have advised the patient to quit/stop smoking as soon as possible due to high risk for multiple medical problems.  It will also be very difficult for [place] to manage patient's  respiratory symptoms and status if we continue to expose her lungs to a known irritant.  We do not advise e-cigarettes as a form of stopping smoking.  Patient is willing to quit smoking.  I have advised the patient that we can assist and have options of nicotine replacement therapy, provided smoking cessation education today, provided smoking cessation counseling, and provided cessation resources.  Follow-up next office visit office visit for assessment of smoking cessation.  Smoking cessation counseling advised for: 3 min    Outpatient Encounter Medications as of 03/02/2018  Medication Sig  . amoxicillin (AMOXIL) 500 MG capsule Take 500 mg by mouth at bedtime.  . divalproex (DEPAKOTE) 500 MG DR tablet Take 1,000 mg by mouth at bedtime.  . insulin aspart (NOVOLOG FLEXPEN) 100 UNIT/ML FlexPen Inject 30 Units 2  (two) times daily with a meal into the skin.  . Insulin  Glargine (LANTUS SOLOSTAR) 100 UNIT/ML Solostar Pen INJECT 100 UNITS SUBCUTANEOUSLY EVERY MORNING. (PT SELF TITRATES)  . lisinopril (PRINIVIL,ZESTRIL) 20 MG tablet Take 20 mg by mouth at bedtime.  Marland Kitchen omeprazole (PRILOSEC) 20 MG capsule Take 20 mg by mouth at bedtime.  . predniSONE (DELTASONE) 20 MG tablet Take 3 tablets (60 mg total) by mouth daily with breakfast.  . risperidone (RISPERDAL) 4 MG tablet Take 4 mg by mouth at bedtime.   . sildenafil (VIAGRA) 100 MG tablet Take 0.5-1 tablets (50-100 mg total) daily as needed by mouth for erectile dysfunction.  . simvastatin (ZOCOR) 20 MG tablet Take 20 mg by mouth at bedtime.  . solifenacin (VESICARE) 5 MG tablet Take 5 mg by mouth at bedtime.   . predniSONE (DELTASONE) 10 MG tablet Take 1 tablet (10 mg total) by mouth daily with breakfast.   No facility-administered encounter medications on file as of 03/02/2018.      Review of Systems  Review of Systems  Constitutional: Negative for activity change, chills, fatigue, fever and unexpected weight change.  HENT: Negative for postnasal drip, rhinorrhea, sinus pressure, sinus pain, sneezing and sore throat.   Eyes: Negative.   Respiratory: Positive for shortness of breath (improved since last office visit ). Negative for cough and wheezing.   Cardiovascular: Negative for chest pain and palpitations.  Gastrointestinal: Negative for constipation, diarrhea, nausea and vomiting.       Denies indigestion   Endocrine: Negative.   Musculoskeletal: Negative.   Skin: Negative.   Neurological: Negative for dizziness and headaches.  Psychiatric/Behavioral: Negative.  Negative for dysphoric mood. The patient is not nervous/anxious.   All other systems reviewed and are negative.    Physical Exam  BP 138/90 (BP Location: Left Arm, Cuff Size: Normal)   Pulse (!) 116   Ht _0  (1.854 m)   Wt 208 lb (94.3 kg)   SpO2 97%   BMI 27.44 kg/m   Wt  Readings from Last 5 Encounters:  03/02/18 208 lb (94.3 kg)  02/16/18 209 lb 12.8 oz (95.2 kg)  02/02/18 207 lb 9.6 oz (94.2 kg)  11/23/17 213 lb (96.6 kg)  08/19/17 229 lb (103.9 kg)    Physical Exam  Constitutional: He is oriented to person, place, and time and well-developed, well-nourished, and in no distress. No distress.  HENT:  Head: Normocephalic and atraumatic.  Right Ear: Hearing, tympanic membrane, external ear and ear canal normal.  Left Ear: Hearing, tympanic membrane, external ear and ear canal normal.  Nose: Nose normal. Right sinus exhibits no maxillary sinus tenderness and no frontal sinus tenderness. Left sinus exhibits no maxillary sinus tenderness and no frontal sinus tenderness.  Mouth/Throat: Uvula is midline and oropharynx is clear and moist. No oropharyngeal exudate.  Eyes: Pupils are equal, round, and reactive to light.  Neck: Normal range of motion. Neck supple. No JVD present.  Cardiovascular: Regular rhythm and normal heart sounds. Tachycardia present.  Pulmonary/Chest: Effort normal. No accessory muscle usage. No respiratory distress. He has no decreased breath sounds. He has no wheezes. He has no rhonchi.  +Diffuse fine crackles more prominent bibasilar area  Abdominal: Soft. Bowel sounds are normal. There is no tenderness.  Musculoskeletal: Normal range of motion. He exhibits no edema.  Lymphadenopathy:    He has no cervical adenopathy.  Neurological: He is alert and oriented to person, place, and time. Gait normal.  Skin: Skin is warm and dry. He is not diaphoretic. No erythema.  Psychiatric: Mood, memory, affect and judgment normal.  Nursing note and vitals reviewed.     Lab Results:  CBC    Component Value Date/Time   WBC 12.9 (H) 02/16/2018 1214   RBC 4.30 02/16/2018 1214   HGB 11.6 (L) 02/16/2018 1214   HGB 14.4 05/30/2013   HCT 34.4 (L) 02/16/2018 1214   HCT 42 05/30/2013   PLT 353.0 02/16/2018 1214   PLT 251 05/30/2013   MCV 79.9  02/16/2018 1214   MCH 32.1 07/05/2013 0900   MCHC 33.6 02/16/2018 1214   RDW 16.4 (H) 02/16/2018 1214   LYMPHSABS 2.1 02/16/2018 1214   MONOABS 1.1 (H) 02/16/2018 1214   EOSABS 0.9 (H) 02/16/2018 1214   BASOSABS 0.2 (H) 02/16/2018 1214    BMET    Component Value Date/Time   NA 140 07/11/2013 0746   K 4.3 07/11/2013 0746   CL 93 (L) 07/05/2013 0900   CO2 20 07/05/2013 0900   GLUCOSE 274 (H) 07/11/2013 0746   BUN 27 (H) 07/05/2013 0900   CREATININE 1.27 07/05/2013 0900   CREATININE 0.67 06/03/2013 1040   CALCIUM 9.7 07/05/2013 0900   GFRNONAA 71 (L) 07/05/2013 0900   GFRAA 83 (L) 07/05/2013 0900    BNP No results found for: BNP  ProBNP No results found for: PROBNP  Imaging: No results found.    Assessment & Plan:   Pleasant 42 year old patient seen office visit today will decrease patient's prednisone to 50 mg daily.  Send in prescription for 10 mg tablets which she can use in addition to his 20 mg tablets.  Reviewed this with patient patient states he understands.  He will take two 20 mg tablets and one 10 mg tablet daily for the next 2 weeks.  He will take this with food in the morning.  Patient will follow-up with our office in 2 weeks with a chest x-ray prior to that appointment.  Patient to continue 2 L of oxygen with exertion when needed in order to maintain oxygen saturations greater than 90%.  Patient to continue 2 L of oxygen at night.  I explained to the patient that his oxygen concentrator will not blow up or over he needs to wear this oxygen throughout the night whenever he is sleeping.  Patient to go to our patient care coordinators today to schedule a follow-up visit with Dr. Loanne Drilling for management of his blood sugars.  Patient still needs to complete pulmonary function test.  Also could consider high-res CT at the completion of prednisone therapy to follow-up July/2019 CT.  Uncontrolled type 1 diabetes mellitus Please proceed to the patient care coordinators  today to schedule appointment with Dr. Loanne Drilling for management of your diabetes  Keep follow-up with Dr. Luan Pulling   Chronic respiratory failure with hypoxia ([place]) Continue 2 L of oxygen with exertion as needed to maintain oxygen saturations greater than 90% Continue 2 L of oxygen at night  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HCC) We will drop your prednisone to 50 mg daily for the next 2 weeks >>> Please take two 20 mg tablets and one 10 mg tablet daily in the morning with food  >>> Do this for 2 weeks  Follow-up with our office in 2 weeks with chest x-ray  Continue oxygen therapy at night >>>It is important that you wear oxygen throughout the entire night  We recommend that you stop smoking.       Lauraine Rinne, NP 03/02/2018

## 2018-03-02 ENCOUNTER — Encounter: Payer: Self-pay | Admitting: Pulmonary Disease

## 2018-03-02 ENCOUNTER — Ambulatory Visit (INDEPENDENT_AMBULATORY_CARE_PROVIDER_SITE_OTHER): Payer: Medicaid Other | Admitting: Pulmonary Disease

## 2018-03-02 VITALS — BP 138/90 | HR 116 | Ht 73.0 in | Wt 208.0 lb

## 2018-03-02 DIAGNOSIS — F17210 Nicotine dependence, cigarettes, uncomplicated: Secondary | ICD-10-CM | POA: Diagnosis not present

## 2018-03-02 DIAGNOSIS — J8281 Chronic eosinophilic pneumonia: Secondary | ICD-10-CM

## 2018-03-02 DIAGNOSIS — J82 Pulmonary eosinophilia, not elsewhere classified: Secondary | ICD-10-CM | POA: Diagnosis not present

## 2018-03-02 DIAGNOSIS — J849 Interstitial pulmonary disease, unspecified: Secondary | ICD-10-CM

## 2018-03-02 DIAGNOSIS — E1065 Type 1 diabetes mellitus with hyperglycemia: Secondary | ICD-10-CM | POA: Diagnosis not present

## 2018-03-02 DIAGNOSIS — J9611 Chronic respiratory failure with hypoxia: Secondary | ICD-10-CM | POA: Diagnosis not present

## 2018-03-02 LAB — CULTURE, FUNGUS WITHOUT SMEAR

## 2018-03-02 MED ORDER — PREDNISONE 10 MG PO TABS: 10.0000 mg | ORAL_TABLET | Freq: Every day | ORAL | 0 refills | Status: DC

## 2018-03-02 NOTE — Assessment & Plan Note (Signed)
We will drop your prednisone to 50 mg daily for the next 2 weeks >>> Please take two 20 mg tablets and one 10 mg tablet daily in the morning with food  >>> Do this for 2 weeks  Follow-up with our office in 2 weeks with chest x-ray  Continue oxygen therapy at night >>>It is important that you wear oxygen throughout the entire night  We recommend that you stop smoking.

## 2018-03-02 NOTE — Assessment & Plan Note (Signed)
Please proceed to the patient care coordinators today to schedule appointment with Dr. Loanne Drilling for management of your diabetes  Keep follow-up with Dr. Luan Pulling

## 2018-03-02 NOTE — Assessment & Plan Note (Signed)
Continue 2 L of oxygen with exertion as needed to maintain oxygen saturations greater than 90% Continue 2 L of oxygen at night

## 2018-03-02 NOTE — Patient Instructions (Addendum)
Please proceed to the patient care coordinators today to schedule appointment with Dr. Loanne Drilling for management of your diabetes  Keep follow-up with Dr. Luan Pulling  We will drop your prednisone to 50 mg daily for the next 2 weeks >>> Please take two 20 mg tablets and one 10 mg tablet daily in the morning with food  >>> Do this for 2 weeks  Follow-up with our office in 2 weeks with chest x-ray  Continue oxygen therapy at night >>>It is important that you wear oxygen throughout the entire night  Continue omeprazole daily for management of GERD  We recommend that you stop smoking.   Smoking Cessation Resources:  1 800 QUIT NOW  >>> Patient to call this resource and utilize it to help support her quit smoking >>> Keep up your hard work with stopping smoking  You can also contact the [facility] >>>For smoking cessation classes call [phone]  We do not recommend using e-cigarettes as a form of stopping smoking      It is flu season:   >>>Remember to be washing your hands regularly, using hand sanitizer, be careful to use around herself with has contact with people who are sick will increase her chances of getting sick yourself. >>> Best ways to protect herself from the flu: Receive the yearly flu vaccine, practice good hand hygiene washing with soap and also using hand sanitizer when available, eat a nutritious meals, get adequate rest, hydrate appropriately    As of 04/05/2018 we will be moving! We will no longer be at our [place] location.   Our new address and phone number will be:  [address] [place], [place] [address] Telephone number: [phone]   Please contact the office if your symptoms worsen or you have concerns that you are not improving.   Thank you for choosing [facility] for your healthcare, and for allowing [place] to partner with you on your healthcare journey. I am thankful to be able to provide care to you today.   Wyn Quaker FNP-C    Coping with Quitting Smoking Quitting smoking is a physical and mental challenge. You will face cravings, withdrawal symptoms, and temptation. Before quitting, work with your health care provider to make a plan that can help you cope. Preparation can help you quit and keep you from giving in. How can I cope with cravings? Cravings usually last for 5-10 minutes. If you get through it, the craving will pass. Consider taking the following actions to help you cope with cravings:  Keep your mouth busy: ? Chew sugar-free gum. ? Suck on hard candies or a straw. ? Brush your teeth.  Keep your hands and body busy: ? Immediately change to a different activity when you feel a craving. ? Squeeze or play with a ball. ? Do an activity or a hobby, like making bead jewelry, practicing needlepoint, or working with wood. ? Mix up your normal routine. ? Take a short exercise break. Go for a quick walk or run up and down stairs. ? Spend time in public places where smoking is not allowed.  Focus on doing something kind or helpful for someone else.  Call a friend or family member to talk during a craving.  Join a support group.  Call a quit line, such as 1-800-QUIT-NOW.  Talk with your health care provider about medicines that might help you cope with cravings and make quitting easier for you.  How can I deal with  withdrawal symptoms? Your body may experience negative effects as it tries to get used to not having nicotine in the system. These effects are called withdrawal symptoms. They may include:  Feeling hungrier than normal.  Trouble concentrating.  Irritability.  Trouble sleeping.  Feeling depressed.  Restlessness and agitation.  Craving a cigarette.  To manage withdrawal symptoms:  Avoid places, people, and activities that trigger your cravings.  Remember why you want to quit.  Get plenty of sleep.  Avoid coffee and other caffeinated drinks. These may worsen  some of your symptoms.  How can I handle social situations? Social situations can be difficult when you are quitting smoking, especially in the first few weeks. To manage this, you can:  Avoid parties, bars, and other social situations where people might be smoking.  Avoid alcohol.  Leave right away if you have the urge to smoke.  Explain to your family and friends that you are quitting smoking. Ask for understanding and support.  Plan activities with friends or family where smoking is not an option.  What are some ways I can cope with stress? Wanting to smoke may cause stress, and stress can make you want to smoke. Find ways to manage your stress. Relaxation techniques can help. For example:  Breathe slowly and deeply, in through your nose and out through your mouth.  Listen to soothing, relaxing music.  Talk with a family member or friend about your stress.  Light a candle.  Soak in a bath or take a shower.  Think about a peaceful place.  What are some ways I can prevent weight gain? Be aware that many people gain weight after they quit smoking. However, not everyone does. To keep from gaining weight, have a plan in place before you quit and stick to the plan after you quit. Your plan should include:  Having healthy snacks. When you have a craving, it may help to: ? Eat plain popcorn, crunchy carrots, celery, or other cut vegetables. ? Chew sugar-free gum.  Changing how you eat: ? Eat small portion sizes at meals. ? Eat 4-6 small meals throughout the day instead of 1-2 large meals a day. ? Be mindful when you eat. Do not watch television or do other things that might distract you as you eat.  Exercising regularly: ? Make time to exercise each day. If you do not have time for a long workout, do short bouts of exercise for 5-10 minutes several times a day. ? Do some form of strengthening exercise, like weight lifting, and some form of aerobic exercise, like running or  swimming.  Drinking plenty of water or other low-calorie or no-calorie drinks. Drink 6-8 glasses of water daily, or as much as instructed by your health care provider.  Summary  Quitting smoking is a physical and mental challenge. You will face cravings, withdrawal symptoms, and temptation to smoke again. Preparation can help you as you go through these challenges.  You can cope with cravings by keeping your mouth busy (such as by chewing gum), keeping your body and hands busy, and making calls to family, friends, or a helpline for people who want to quit smoking.  You can cope with withdrawal symptoms by avoiding places where people smoke, avoiding drinks with caffeine, and getting plenty of rest.  Ask your health care provider about the different ways to prevent weight gain, avoid stress, and handle social situations. This information is not intended to replace advice given to you by your health care  provider. Make sure you discuss any questions you have with your health care provider. Document Released: 05/16/2016 Document Revised: 05/16/2016 Document Reviewed: 05/16/2016 Elsevier Interactive Patient Education  2018 [place] for Gastroesophageal Reflux Disease, Adult When you have gastroesophageal reflux disease (GERD), the foods you eat and your eating habits are very important. Choosing the right foods can help ease your discomfort. What guidelines do I need to follow?  Choose fruits, vegetables, whole grains, and low-fat dairy products.  Choose low-fat meat, fish, and poultry.  Limit fats such as oils, salad dressings, butter, nuts, and avocado.  Keep a food diary. This helps you identify foods that cause symptoms.  Avoid foods that cause symptoms. These may be different for everyone.  Eat small meals often instead of 3 large meals a day.  Eat your meals slowly, in a place where you are relaxed.  Limit fried foods.  Cook foods using  methods other than frying.  Avoid drinking alcohol.  Avoid drinking large amounts of liquids with your meals.  Avoid bending over or lying down until 2-3 hours after eating. What foods are not recommended? These are some foods and drinks that may make your symptoms worse: Vegetables Tomatoes. Tomato juice. Tomato and spaghetti sauce. Chili peppers. Onion and garlic. Horseradish. Fruits Oranges, grapefruit, and lemon (fruit and juice). Meats High-fat meats, fish, and poultry. This includes hot dogs, ribs, ham, sausage, salami, and bacon. Dairy Whole milk and chocolate milk. Sour cream. Cream. Butter. Ice cream. Cream cheese. Drinks Coffee and tea. Bubbly (carbonated) drinks or energy drinks. Condiments Hot sauce. Barbecue sauce. Sweets/Desserts Chocolate and cocoa. Donuts. Peppermint and spearmint. Fats and Oils High-fat foods. This includes [place] fries and potato chips. Other Vinegar. Strong spices. This includes black pepper, white pepper, red pepper, cayenne, curry powder, cloves, ginger, and chili powder. The items listed above may not be a complete list of foods and drinks to avoid. Contact your dietitian for more information. This information is not intended to replace advice given to you by your health care provider. Make sure you discuss any questions you have with your health care provider. Document Released: 11/18/2011 Document Revised: 10/25/2015 Document Reviewed: 03/23/2013 Elsevier Interactive Patient Education  2017 [employer].        Gastroesophageal Reflux Disease, Adult Normally, food travels down the esophagus and stays in the stomach to be digested. If a person has gastroesophageal reflux disease (GERD), food and stomach acid move back up into the esophagus. When this happens, the esophagus becomes sore and swollen (inflamed). Over time, GERD can make small holes (ulcers) in the lining of the esophagus. Follow these instructions at home: Diet  Follow  a diet as told by your doctor. You may need to avoid foods and drinks such as: ? Coffee and tea (with or without caffeine). ? Drinks that contain alcohol. ? Energy drinks and sports drinks. ? Carbonated drinks or sodas. ? Chocolate and cocoa. ? Peppermint and mint flavorings. ? Garlic and onions. ? Horseradish. ? Spicy and acidic foods, such as peppers, chili powder, curry powder, vinegar, hot sauces, and BBQ sauce. ? Citrus fruit juices and citrus fruits, such as oranges, lemons, and limes. ? Tomato-based foods, such as red sauce, chili, salsa, and pizza with red sauce. ? Fried and fatty foods, such as donuts, french fries, potato chips, and high-fat dressings. ? High-fat meats, such as hot dogs, rib eye steak, sausage, ham, and bacon. ? High-fat dairy items,  such as whole milk, butter, and cream cheese.  Eat small meals often. Avoid eating large meals.  Avoid drinking large amounts of liquid with your meals.  Avoid eating meals during the 2-3 hours before bedtime.  Avoid lying down right after you eat.  Do not exercise right after you eat. General instructions  Pay attention to any changes in your symptoms.  Take over-the-counter and prescription medicines only as told by your doctor. Do not take aspirin, ibuprofen, or other NSAIDs unless your doctor says it is okay.  Do not use any tobacco products, including cigarettes, chewing tobacco, and e-cigarettes. If you need help quitting, ask your doctor.  Wear loose clothes. Do not wear anything tight around your waist.  Raise (elevate) the head of your bed about 6 inches (15 cm).  Try to lower your stress. If you need help doing this, ask your doctor.  If you are overweight, lose an amount of weight that is healthy for you. Ask your doctor about a safe weight loss goal.  Keep all follow-up visits as told by your doctor. This is important. Contact a doctor if:  You have new symptoms.  You lose weight and you do not know why  it is happening.  You have trouble swallowing, or it hurts to swallow.  You have wheezing or a cough that keeps happening.  Your symptoms do not get better with treatment.  You have a hoarse voice. Get help right away if:  You have pain in your arms, neck, jaw, teeth, or back.  You feel sweaty, dizzy, or light-headed.  You have chest pain or shortness of breath.  You throw up (vomit) and your throw up looks like blood or coffee grounds.  You pass out (faint).  Your poop (stool) is bloody or black.  You cannot swallow, drink, or eat. This information is not intended to replace advice given to you by your health care provider. Make sure you discuss any questions you have with your health care provider. Document Released: 11/05/2007 Document Revised: 10/25/2015 Document Reviewed: 09/13/2014 Elsevier Interactive Patient Education  [employer].

## 2018-03-08 ENCOUNTER — Encounter: Payer: Self-pay | Admitting: Endocrinology

## 2018-03-08 ENCOUNTER — Ambulatory Visit (INDEPENDENT_AMBULATORY_CARE_PROVIDER_SITE_OTHER): Payer: Medicaid Other | Admitting: Endocrinology

## 2018-03-08 VITALS — BP 126/90 | HR 114 | Ht 73.0 in | Wt 210.2 lb

## 2018-03-08 DIAGNOSIS — E1065 Type 1 diabetes mellitus with hyperglycemia: Secondary | ICD-10-CM

## 2018-03-08 LAB — POCT GLYCOSYLATED HEMOGLOBIN (HGB A1C): Hemoglobin A1C: 7.5 % — AB (ref 4.0–5.6)

## 2018-03-08 MED ORDER — GLUCOSE BLOOD VI STRP: 1.0000 | ORAL_STRIP | Freq: Two times a day (BID) | 12 refills | Status: DC

## 2018-03-08 MED ORDER — INSULIN GLARGINE 100 UNIT/ML SOLOSTAR PEN: 100.0000 [IU] | PEN_INJECTOR | SUBCUTANEOUS | 11 refills | Status: DC

## 2018-03-08 MED ORDER — INSULIN ASPART 100 UNIT/ML FLEXPEN: 30.0000 [IU] | PEN_INJECTOR | Freq: Two times a day (BID) | SUBCUTANEOUS | 11 refills | Status: DC

## 2018-03-08 NOTE — Progress Notes (Signed)
Subjective:    Patient ID: [name], male    DOB: [birth year], 42 y.o.   MRN: [id number]  HPI Pt returns for f/u of diabetes mellitus: DM type: 1 Dx'ed: 5366 Complications: renal insuff Therapy: insulin since dx.   DKA: he has had 3 episodes since dx--each with going off his insulin.  Severe hypoglycemia: never Pancreatitis: he had gallstone pancreatitis in late 2014.  Other: we discussed different insulin regimens, and he chooses qd lantus, and BID novolog.  He self-administers insulin.   Interval history: He says he never misses the insulin. He has been on prednisone for pulm fibrosis.  He now takes 50 mg qam.  no cbg record, but states cbg's vary from 85-120.  It is in general higher as the day goes on.   Past Medical History:  Diagnosis Date  . Bipolar disorder ([place])   . Diabetes mellitus without complication ([place])   . Hyperlipidemia   . Pancreatitis     Past Surgical History:  Procedure Laterality Date  . APPENDECTOMY    . CHOLECYSTECTOMY N/A 07/11/2013   Procedure: LAPAROSCOPIC CHOLECYSTECTOMY;  Surgeon: Jamesetta So, MD;  Location: [facility];  Service: General;  Laterality: N/A;  . KNEE SURGERY Bilateral   . VIDEO BRONCHOSCOPY Bilateral 02/09/2018   Procedure: VIDEO BRONCHOSCOPY WITHOUT FLUORO;  Surgeon: Brand Males, MD;  Location: [facility];  Service: Cardiopulmonary;  Laterality: Bilateral;    Social History   Socioeconomic History  . Marital status: Single    Spouse name: Not on file  . Number of children: 0  . Years of education: Not on file  . Highest education level: Not on file  Occupational History  . Not on file  Social Needs  . Financial resource strain: Not on file  . Food insecurity:    Worry: Not on file    Inability: Not on file  . Transportation needs:    Medical: Not on file    Non-medical: Not on file  Tobacco Use  . Smoking status: Current Some Day Smoker    Packs/day: 0.50    Years: 10.00    Pack years: 5.00    Types:  Cigarettes  . Smokeless tobacco: Never Used  Substance and Sexual Activity  . Alcohol use: No  . Drug use: No  . Sexual activity: Never  Lifestyle  . Physical activity:    Days per week: Not on file    Minutes per session: Not on file  . Stress: Not on file  Relationships  . Social connections:    Talks on phone: Not on file    Gets together: Not on file    Attends religious service: Not on file    Active member of club or organization: Not on file    Attends meetings of clubs or organizations: Not on file    Relationship status: Not on file  . Intimate partner violence:    Fear of current or ex partner: Not on file    Emotionally abused: Not on file    Physically abused: Not on file    Forced sexual activity: Not on file  Other Topics Concern  . Not on file  Social History Narrative  . Not on file    Current Outpatient Medications on File Prior to Visit  Medication Sig Dispense Refill  . amoxicillin (AMOXIL) 500 MG capsule Take 500 mg by mouth at bedtime.    . divalproex (DEPAKOTE) 500 MG DR tablet Take 1,000 mg by mouth at bedtime.    Marland Kitchen  lisinopril (PRINIVIL,ZESTRIL) 20 MG tablet Take 20 mg by mouth at bedtime.    Marland Kitchen omeprazole (PRILOSEC) 20 MG capsule Take 20 mg by mouth at bedtime.    . predniSONE (DELTASONE) 10 MG tablet Take 1 tablet (10 mg total) by mouth daily with breakfast. 30 tablet 0  . predniSONE (DELTASONE) 20 MG tablet Take 3 tablets (60 mg total) by mouth daily with breakfast. 90 tablet 0  . risperidone (RISPERDAL) 4 MG tablet Take 4 mg by mouth at bedtime.     . sildenafil (VIAGRA) 100 MG tablet Take 0.5-1 tablets (50-100 mg total) daily as needed by mouth for erectile dysfunction. 30 tablet 11  . simvastatin (ZOCOR) 20 MG tablet Take 20 mg by mouth at bedtime.    . solifenacin (VESICARE) 5 MG tablet Take 5 mg by mouth at bedtime.      No current facility-administered medications on file prior to visit.     No Known Allergies  Family History  Problem  Relation Age of Onset  . Diabetes Mother   . Diabetes Maternal Grandmother   . Pancreatitis Neg Hx   . Colon cancer Neg Hx     BP 126/90   Pulse (!) 114   Ht 6\' 1"  (1.854 m)   Wt 210 lb 3.2 oz (95.3 kg)   SpO2 94%   BMI 27.73 kg/m   Review of Systems He denies hypoglycemia    Objective:   Physical Exam VITAL SIGNS:  See vs page GENERAL: no distress Pulses: foot pulses are intact bilaterally.   MSK: no deformity of the feet or ankles.  CV: no edema of the legs or ankles Skin:  no ulcer on the feet or ankles.  normal color and temp on the feet and ankles Neuro: sensation is intact to touch on the feet and ankles.    Lab Results  Component Value Date   TSH 1.78 10/13/2016    Lab Results  Component Value Date   HGBA1C 7.5 (A) 03/08/2018       Assessment & Plan:  Insulin-requiring type 2 DM, with renal insuff: worse pulm fibrosis: steroid rx is affecting A1c  Patient Instructions  check your blood sugar twice a day.  vary the time of day when you check, between before the 3 meals, and at bedtime.  also check if you have symptoms of your blood sugar being too high or too low.  please keep a record of the readings and bring it to your next appointment here (or you can bring the meter itself).  You can write it on any piece of paper.  please call us sooner if your blood sugar goes below 70, or if you have a lot of readings over 200. Please continue the same insulins.    Please come back for a follow-up appointment in 2 months.

## 2018-03-08 NOTE — Patient Instructions (Addendum)
check your blood sugar twice a day.  vary the time of day when you check, between before the 3 meals, and at bedtime.  also check if you have symptoms of your blood sugar being too high or too low.  please keep a record of the readings and bring it to your next appointment here (or you can bring the meter itself).  You can write it on any piece of paper.  please call us sooner if your blood sugar goes below 70, or if you have a lot of readings over 200. Please continue the same insulins.    Please come back for a follow-up appointment in 2 months.

## 2018-03-15 NOTE — Progress Notes (Deleted)
_0  ID: [name], male    DOB: Mar 01, 1976, 42 y.o.   MRN: [id number]  No chief complaint on file.   Referring provider: Sinda Du, MD  HPI:  42 year old male every day smoker initially seen for pulmonary care consult on 02/02/2018.  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 Chase Caller   Recent [facility] Encounters:   02/16/2018-office visit- TP Patient presents for two-week follow-up visit after pulmonary care consult on bronchoscopy.  Patient is an every day smoker.  Patient underwent CT of chest in July/2019 that showed mild lymphadenopathy.  Diffuse chronic appearing interstitial lung disease with mild bronchiectatic changes.  Patient with occasional marijuana use.  Denies cocaine and heroin. Patient began a work-up for interstitial lung disease.  Autoimmune and connective tissue was essentially negative except for an elevated sed rate at 130.  Patient underwent bronchoscopy. On February 09, 2018 results showed negative preliminary cultures.  BAL results showed eosinophilia and neutrophilia.  Patient had an overnight oximetry test that showed desaturations.  Walk test in the office shows desaturations with ambulation at 86% on room air.O2 saturation at rest 95%. Plan: Begin prednisone 60 mg daily with food, begin 2 L of oxygen with activity and at bedtime, follow-up with Dr. Loanne Drilling for diabetes.  If your blood sugars are greater than 250 please call our office or Dr. Cordelia Pen office.  Follow-up with Dr. Chase Caller or TP in 2 weeks and as needed..    03/02/2018  - Visit   42 year old male every day smoker presenting today for follow-up for eosinophilic pneumonia.  Patient reports adherence to 60 mg of prednisone daily and feels that his breathing has significantly improved  since starting prednisone.  Unfortunately patient is still smoking 1 to 2 cigarettes a day.  Patient reports that he typically wears his oxygen at night but will take it off occasionally as he sleeps because he does not want his oxygen to "blow up or overheat".  Patient reports that his blood sugars have been "okay".  Fasting blood sugar today 143.  This was prior to taking prednisone or eating.  Patient has not followed up with Dr. Loanne Drilling or Dr. Luan Pulling regarding blood sugars as he was previously instructed at 02/16/2018 office visit with TP.  Patient reports that he "forgot to schedule the appointment".  Patient denied any sort of acute symptoms today.  Patient reports that his shortness of breath has improved.  No other complaints today.   Tests:    03/15/2018  - Visit   HPI   Tests:    02/16/2018-CBC with differential- eosinophils 7.2, neutrophils 8.6, eosinophils absolute 0.9 02/16/2018-IgE 369 02/09/2018- culture- Candida albicans 02/09/2018-AFB-negative 02/09/2018- BAL- neutrophil count 63, eosinophils 23 02/09/2018- pathology report-bronchial lavage-benign reactive reparative changes, acute inflammation 02/02/2018-sed rate-130 02/02/2018 anti-DNA, ANA, rheumatoid factor, CCP, anti-scleroderma, ANCA, CK-MB, hypersensitivity pneumonitis panel -negative, normal   12/02/17-CT chest without contrast- no evidence of pulmonary consolidation or pleural effusion, diffuse chronic appearing interstitial lung disease is seen throughout both lungs with mild bronchiectasis, no significant subpleural honeycombing, similar findings were seen on images through lung bases abdomen CT of 05/28/2013 >>>Mild bilateral mediastinal lymphadenopathy >>>Consider high-res lung CT for further evaluation   Chart Review:  Pulmonary function test>>>   FENO:  Lab Results  Component Value Date   NITRICOXIDE 11 02/02/2018    PFT: No flowsheet  data found.  Imaging: No results found.  Chart Review:     Specialty Problems      Pulmonary Problems   ILD (interstitial lung disease) ([place])    02/16/2018-CBC with differential- eosinophils 7.2, neutrophils 8.6, eosinophils absolute 0.9 02/16/2018-IgE 369 02/09/2018- culture- Candida albicans 02/09/2018-AFB-negative 02/09/2018- BAL- neutrophil count 63, eosinophils 23 02/09/2018- pathology report-bronchial lavage-benign reactive reparative changes, acute inflammation 02/02/2018-sed rate-130 02/02/2018 anti-DNA, ANA, rheumatoid factor, CCP, anti-scleroderma, ANCA, CK-MB, hypersensitivity pneumonitis panel -negative, normal   12/02/17-CT chest without contrast- no evidence of pulmonary consolidation or pleural effusion, diffuse chronic appearing interstitial lung disease is seen throughout both lungs with mild bronchiectasis, no significant subpleural honeycombing, similar findings were seen on images through lung bases abdomen CT of 05/28/2013 >>>Mild bilateral mediastinal lymphadenopathy >>>Consider high-res lung CT for further evaluation  Pulmonary function test>>>      Chronic respiratory failure with hypoxia (HCC)    02/16/18>>> Patient had an overnight oximetry test that showed desaturations.  Walk test in the office shows desaturations with ambulation at 86% on room air.O2 saturation at rest 95%.      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[place])    02/16/2018-CBC with differential- eosinophils 7.2, neutrophils 8.6, eosinophils absolute 0.9 02/16/2018-IgE 369 02/09/2018- culture- Candida albicans 02/09/2018-AFB-negative 02/09/2018- BAL- neutrophil count 63, eosinophils 23 02/09/2018- pathology report-bronchial lavage-benign reactive reparative changes, acute inflammation 02/02/2018-sed rate-130 02/02/2018 anti-DNA, ANA, rheumatoid factor, CCP, anti-scleroderma, ANCA, CK-MB, hypersensitivity pneumonitis panel -negative, normal  12/02/17-CT chest without contrast- no evidence of pulmonary consolidation or pleural effusion, diffuse chronic appearing interstitial lung  disease is seen throughout both lungs with mild bronchiectasis, no significant subpleural honeycombing, similar findings were seen on images through lung bases abdomen CT of 05/28/2013 >>>Mild bilateral mediastinal lymphadenopathy >>>Consider high-res lung CT for further evaluation  Pulmonary function test>>>         No Known Allergies  Immunization History  Administered Date(s) Administered  . Influenza,inj,Quad PF,6+ Mos 06/06/2013    Past Medical History:  Diagnosis Date  . Bipolar disorder ([place])   . Diabetes mellitus without complication ([place])   . Hyperlipidemia   . Pancreatitis     Tobacco History: Social History   Tobacco Use  Smoking Status Current Some Day Smoker  . Packs/day: 0.50  . Years: 10.00  . Pack years: 5.00  . Types: Cigarettes  Smokeless Tobacco Never Used   Ready to quit: Not Answered Counseling given: Not Answered   Outpatient Encounter Medications as of 03/16/2018  Medication Sig  . amoxicillin (AMOXIL) 500 MG capsule Take 500 mg by mouth at bedtime.  . divalproex (DEPAKOTE) 500 MG DR tablet Take 1,000 mg by mouth at bedtime.  Marland Kitchen glucose blood (ONETOUCH VERIO) test strip 1 each by Other route 2 (two) times daily. And lancets 2/day  . insulin aspart (NOVOLOG FLEXPEN) 100 UNIT/ML FlexPen Inject 30 Units into the skin 2 (two) times daily with a meal.  . Insulin Glargine (LANTUS SOLOSTAR) 100 UNIT/ML Solostar Pen Inject 100 Units into the skin every morning.  Marland Kitchen lisinopril (PRINIVIL,ZESTRIL) 20 MG tablet Take 20 mg by mouth at bedtime.  Marland Kitchen omeprazole (PRILOSEC) 20 MG capsule Take 20 mg by mouth at bedtime.  . predniSONE (DELTASONE) 10 MG tablet Take 1 tablet (10 mg total) by mouth daily with breakfast.  . predniSONE (DELTASONE) 20 MG tablet Take 3 tablets (60 mg total) by mouth daily with breakfast.  . risperidone (RISPERDAL) 4 MG tablet Take 4 mg by mouth at bedtime.   . sildenafil (VIAGRA) 100 MG tablet Take 0.5-1 tablets (  50-100 mg total) daily as  needed by mouth for erectile dysfunction.  . simvastatin (ZOCOR) 20 MG tablet Take 20 mg by mouth at bedtime.  . solifenacin (VESICARE) 5 MG tablet Take 5 mg by mouth at bedtime.    No facility-administered encounter medications on file as of 03/16/2018.      Review of Systems  Review of Systems   Physical Exam  There were no vitals taken for this visit.  Wt Readings from Last 5 Encounters:  03/08/18 210 lb 3.2 oz (95.3 kg)  03/02/18 208 lb (94.3 kg)  02/16/18 209 lb 12.8 oz (95.2 kg)  02/02/18 207 lb 9.6 oz (94.2 kg)  11/23/17 213 lb (96.6 kg)     Physical Exam    Lab Results:  CBC    Component Value Date/Time   WBC 12.9 (H) 02/16/2018 1214   RBC 4.30 02/16/2018 1214   HGB 11.6 (L) 02/16/2018 1214   HGB 14.4 05/30/2013   HCT 34.4 (L) 02/16/2018 1214   HCT 42 05/30/2013   PLT 353.0 02/16/2018 1214   PLT 251 05/30/2013   MCV 79.9 02/16/2018 1214   MCH 32.1 07/05/2013 0900   MCHC 33.6 02/16/2018 1214   RDW 16.4 (H) 02/16/2018 1214   LYMPHSABS 2.1 02/16/2018 1214   MONOABS 1.1 (H) 02/16/2018 1214   EOSABS 0.9 (H) 02/16/2018 1214   BASOSABS 0.2 (H) 02/16/2018 1214    BMET    Component Value Date/Time   NA 140 07/11/2013 0746   K 4.3 07/11/2013 0746   CL 93 (L) 07/05/2013 0900   CO2 20 07/05/2013 0900   GLUCOSE 274 (H) 07/11/2013 0746   BUN 27 (H) 07/05/2013 0900   CREATININE 1.27 07/05/2013 0900   CREATININE 0.67 06/03/2013 1040   CALCIUM 9.7 07/05/2013 0900   GFRNONAA 71 (L) 07/05/2013 0900   GFRAA 83 (L) 07/05/2013 0900    BNP No results found for: BNP  ProBNP No results found for: PROBNP    Assessment & Plan:     No problem-specific Assessment & Plan notes found for this encounter.     Lauraine Rinne, NP 03/15/2018

## 2018-03-16 ENCOUNTER — Ambulatory Visit: Payer: Medicaid Other | Admitting: Pulmonary Disease

## 2018-03-29 LAB — ACID FAST CULTURE WITH REFLEXED SENSITIVITIES

## 2018-03-29 LAB — ACID FAST CULTURE WITH REFLEXED SENSITIVITIES (MYCOBACTERIA): Acid Fast Culture: NEGATIVE

## 2018-04-12 ENCOUNTER — Encounter (HOSPITAL_COMMUNITY): Payer: Self-pay | Admitting: Internal Medicine

## 2018-04-27 ENCOUNTER — Ambulatory Visit (HOSPITAL_COMMUNITY)
Admission: RE | Admit: 2018-04-27 | Discharge: 2018-04-27 | Disposition: A | Discharge status: Home / Self Care | Payer: Medicaid Other | Source: Ambulatory Visit | Attending: Pulmonary Disease | Admitting: Pulmonary Disease

## 2018-04-27 ENCOUNTER — Other Ambulatory Visit (HOSPITAL_COMMUNITY): Payer: Self-pay | Admitting: Pulmonary Disease

## 2018-04-27 DIAGNOSIS — J849 Interstitial pulmonary disease, unspecified: Secondary | ICD-10-CM

## 2018-05-10 ENCOUNTER — Ambulatory Visit: Payer: Medicaid Other | Admitting: Endocrinology

## 2018-07-12 ENCOUNTER — Ambulatory Visit: Payer: Medicaid Other | Admitting: Endocrinology

## 2018-07-12 ENCOUNTER — Encounter: Payer: Self-pay | Admitting: Endocrinology

## 2018-07-12 ENCOUNTER — Other Ambulatory Visit: Payer: Self-pay

## 2018-07-12 VITALS — BP 128/86 | HR 102 | Temp 98.4°F | Resp 17 | Wt 220.5 lb

## 2018-07-12 DIAGNOSIS — E1065 Type 1 diabetes mellitus with hyperglycemia: Secondary | ICD-10-CM | POA: Diagnosis not present

## 2018-07-12 LAB — POCT GLYCOSYLATED HEMOGLOBIN (HGB A1C): HEMOGLOBIN A1C: 9.8 % — AB (ref 4.0–5.6)

## 2018-07-12 MED ORDER — INSULIN ASPART 100 UNIT/ML FLEXPEN: PEN_INJECTOR | SUBCUTANEOUS | 11 refills | Status: DC

## 2018-07-12 NOTE — Progress Notes (Signed)
Subjective:    Patient ID: [name], male    DOB: 17-May-1976, 42 y.o.   MRN: [id number]  HPI Pt returns for f/u of diabetes mellitus: DM type: 1 Dx'ed: 5053 Complications: renal insuff and foot ulcer Therapy: insulin since dx.   DKA: he has had 3 episodes since dx--each with going off his insulin.  Severe hypoglycemia: never Pancreatitis: he had gallstone pancreatitis in late 2014.  Other: we discussed different insulin regimens, and he chooses qd lantus, and BID novolog.  He self-administers insulin.   Interval history: He says he never misses the insulin. He takes prednisone for pulm fibrosis.  He now takes 60 mg qam.  no cbg record, but states cbg's vary from 90-200.  It is in general higher as the day goes on.  He says it is low in the middle of the night, but he does not check cbg then.  Past Medical History:  Diagnosis Date  . Bipolar disorder ([place])   . Diabetes mellitus without complication ([place])   . Hyperlipidemia   . Pancreatitis     Past Surgical History:  Procedure Laterality Date  . APPENDECTOMY    . CHOLECYSTECTOMY N/A 07/11/2013   Procedure: LAPAROSCOPIC CHOLECYSTECTOMY;  Surgeon: Jamesetta So, MD;  Location: [facility];  Service: General;  Laterality: N/A;  . KNEE SURGERY Bilateral   . VIDEO BRONCHOSCOPY Bilateral 02/09/2018   Procedure: VIDEO BRONCHOSCOPY WITHOUT FLUORO;  Surgeon: Brand Males, MD;  Location: [facility];  Service: Cardiopulmonary;  Laterality: Bilateral;    Social History   Socioeconomic History  . Marital status: Single    Spouse name: Not on file  . Number of children: 0  . Years of education: Not on file  . Highest education level: Not on file  Occupational History  . Not on file  Social Needs  . Financial resource strain: Not on file  . Food insecurity:    Worry: Not on file    Inability: Not on file  . Transportation needs:    Medical: Not on file    Non-medical: Not on file  Tobacco Use  . Smoking status:  Current Some Day Smoker    Packs/day: 0.50    Years: 10.00    Pack years: 5.00    Types: Cigarettes  . Smokeless tobacco: Never Used  Substance and Sexual Activity  . Alcohol use: No  . Drug use: No  . Sexual activity: Never  Lifestyle  . Physical activity:    Days per week: Not on file    Minutes per session: Not on file  . Stress: Not on file  Relationships  . Social connections:    Talks on phone: Not on file    Gets together: Not on file    Attends religious service: Not on file    Active member of club or organization: Not on file    Attends meetings of clubs or organizations: Not on file    Relationship status: Not on file  . Intimate partner violence:    Fear of current or ex partner: Not on file    Emotionally abused: Not on file    Physically abused: Not on file    Forced sexual activity: Not on file  Other Topics Concern  . Not on file  Social History Narrative  . Not on file    Current Outpatient Medications on File Prior to Visit  Medication Sig Dispense Refill  . amoxicillin (AMOXIL) 500 MG capsule Take 500 mg by mouth  at bedtime.    . divalproex (DEPAKOTE) 500 MG DR tablet Take 1,000 mg by mouth at bedtime.    Marland Kitchen glucose blood (ONETOUCH VERIO) test strip 1 each by Other route 2 (two) times daily. And lancets 2/day 100 each 12  . Insulin Glargine (LANTUS SOLOSTAR) 100 UNIT/ML Solostar Pen Inject 100 Units into the skin every morning. 15 pen 11  . lisinopril (PRINIVIL,ZESTRIL) 20 MG tablet Take 20 mg by mouth at bedtime.    Marland Kitchen omeprazole (PRILOSEC) 20 MG capsule Take 20 mg by mouth at bedtime.    . predniSONE (DELTASONE) 20 MG tablet Take 3 tablets (60 mg total) by mouth daily with breakfast. 90 tablet 0  . risperidone (RISPERDAL) 4 MG tablet Take 4 mg by mouth at bedtime.     . sildenafil (VIAGRA) 100 MG tablet Take 0.5-1 tablets (50-100 mg total) daily as needed by mouth for erectile dysfunction. 30 tablet 11  . simvastatin (ZOCOR) 20 MG tablet Take 20 mg by  mouth at bedtime.    . solifenacin (VESICARE) 5 MG tablet Take 5 mg by mouth at bedtime.      No current facility-administered medications on file prior to visit.     No Known Allergies  Family History  Problem Relation Age of Onset  . Diabetes Mother   . Diabetes Maternal Grandmother   . Pancreatitis Neg Hx   . Colon cancer Neg Hx     BP 128/86   Pulse (!) 102   Temp 98.4 F (36.9 C) (Oral)   Resp 17   Wt 220 lb 8 oz (100 kg)   SpO2 98%   BMI 29.09 kg/m  Review of Systems He denies hypoglycemia.  He has gained weight.      Objective:   Physical Exam VITAL SIGNS:  See vs page GENERAL: no distress Pulses: dorsalis pedis intact bilat.   MSK: no deformity of the feet CV: no leg edema Skin:  Healed ulcer at the plantar aspect of the right foot, under the MTP.  normal color and temp on the feet. Neuro: sensation is intact to touch on the feet   Lab Results  Component Value Date   HGBA1C 9.8 (A) 07/12/2018       Assessment & Plan:  Type 1 DM, with foot ulcer: worse Renal insuff: humalog is chosen for increase pulm fibrosis: prednisone is increasing a1c Hypoglycemia: this is limiting aggressiveness of glycemic control  Patient Instructions  check your blood sugar twice a day.  vary the time of day when you check, between before the 3 meals, and at bedtime.  also check if you have symptoms of your blood sugar being too high or too low.  please keep a record of the readings and bring it to your next appointment here (or you can bring the meter itself).  You can write it on any piece of paper.  please call us sooner if your blood sugar goes below 70, or if you have a lot of readings over 200. Please increase the humalog to 50 units with breakfast and 30 units with supper, and: Please continue the same lantus.   Please come back for a follow-up appointment in 2 months.

## 2018-07-12 NOTE — Patient Instructions (Addendum)
check your blood sugar twice a day.  vary the time of day when you check, between before the 3 meals, and at bedtime.  also check if you have symptoms of your blood sugar being too high or too low.  please keep a record of the readings and bring it to your next appointment here (or you can bring the meter itself).  You can write it on any piece of paper.  please call us sooner if your blood sugar goes below 70, or if you have a lot of readings over 200. Please increase the humalog to 50 units with breakfast and 30 units with supper, and: Please continue the same lantus.   Please come back for a follow-up appointment in 2 months.

## 2018-07-13 ENCOUNTER — Telehealth: Payer: Self-pay | Admitting: Endocrinology

## 2018-07-13 NOTE — Telephone Encounter (Signed)
Please let me know once you have completed this patient's office note from 07/12/18. Will fax to PCP as requested.

## 2018-07-13 NOTE — Telephone Encounter (Signed)
I closed note

## 2018-07-13 NOTE — Telephone Encounter (Signed)
Records faxed as requested. Confirmation received.

## 2018-07-13 NOTE — Telephone Encounter (Signed)
Dr Georgiann Cocker office called to request the most recent a1c and office notes from the last visit. They would like those faxed to the number listed below   [phone]

## 2018-07-27 ENCOUNTER — Other Ambulatory Visit: Payer: Self-pay

## 2018-07-27 ENCOUNTER — Emergency Department (HOSPITAL_COMMUNITY): Payer: Medicaid Other

## 2018-07-27 ENCOUNTER — Encounter (HOSPITAL_COMMUNITY): Payer: Self-pay | Admitting: Emergency Medicine

## 2018-07-27 ENCOUNTER — Emergency Department (HOSPITAL_COMMUNITY)
Admission: EM | Admit: 2018-07-27 | Discharge: 2018-07-27 | Disposition: A | Discharge status: Home / Self Care | Payer: Medicaid Other | Attending: Emergency Medicine | Admitting: Emergency Medicine

## 2018-07-27 DIAGNOSIS — F319 Bipolar disorder, unspecified: Secondary | ICD-10-CM | POA: Insufficient documentation

## 2018-07-27 DIAGNOSIS — Z79899 Other long term (current) drug therapy: Secondary | ICD-10-CM | POA: Diagnosis not present

## 2018-07-27 DIAGNOSIS — E119 Type 2 diabetes mellitus without complications: Secondary | ICD-10-CM | POA: Diagnosis not present

## 2018-07-27 DIAGNOSIS — J45901 Unspecified asthma with (acute) exacerbation: Secondary | ICD-10-CM

## 2018-07-27 DIAGNOSIS — F17210 Nicotine dependence, cigarettes, uncomplicated: Secondary | ICD-10-CM | POA: Insufficient documentation

## 2018-07-27 DIAGNOSIS — Z794 Long term (current) use of insulin: Secondary | ICD-10-CM | POA: Insufficient documentation

## 2018-07-27 DIAGNOSIS — Z9049 Acquired absence of other specified parts of digestive tract: Secondary | ICD-10-CM | POA: Insufficient documentation

## 2018-07-27 DIAGNOSIS — J40 Bronchitis, not specified as acute or chronic: Secondary | ICD-10-CM | POA: Insufficient documentation

## 2018-07-27 DIAGNOSIS — R062 Wheezing: Secondary | ICD-10-CM | POA: Diagnosis present

## 2018-07-27 LAB — CBG MONITORING, ED: GLUCOSE-CAPILLARY: 94 mg/dL (ref 70–99)

## 2018-07-27 MED ORDER — DOXYCYCLINE HYCLATE 100 MG PO CAPS: 100.0000 mg | ORAL_CAPSULE | Freq: Two times a day (BID) | ORAL | 0 refills | Status: DC

## 2018-07-27 MED ORDER — ALBUTEROL (5 MG/ML) CONTINUOUS INHALATION SOLN
10.0000 mg/h | INHALATION_SOLUTION | Freq: Once | RESPIRATORY_TRACT | Status: AC
  Administered 2018-07-27: 10 mg/h via RESPIRATORY_TRACT
  Filled 2018-07-27: qty 20

## 2018-07-27 MED ORDER — AEROCHAMBER Z-STAT PLUS/MEDIUM MISC
1.0000 | Freq: Once | Status: AC
  Administered 2018-07-27: 1

## 2018-07-27 MED ORDER — DOXYCYCLINE HYCLATE 100 MG PO TABS
100.0000 mg | ORAL_TABLET | Freq: Once | ORAL | Status: AC
  Administered 2018-07-27: 100 mg via ORAL
  Filled 2018-07-27: qty 1

## 2018-07-27 NOTE — ED Provider Notes (Signed)
[facility] EMERGENCY DEPARTMENT Provider Note   CSN: [id number] Arrival date & time: 07/27/18  0102  Time seen 1:18 AM  History   Chief Complaint Chief Complaint  Patient presents with  . Shortness of Breath    HPI [name] is a 43 y.o. male.     HPI patient states about 11:30 PM he was in bed trying to go to sleep and "my wind gets stuck in my throat".  He states he has been wheezing for the past 2 weeks but it got worse tonight.  He states he is being treated by Dr. Luan Pulling for a "lung problem from mold".  He states he used his inhaler tonight x3 without improvement.  He states he does have temporary oxygen that he wears at night for the past year.  He states the only antibiotic he is on is the amoxicillin that he takes every night for years because he gets frequent abscesses.  He states he is also been on prednisone 60 mg a day for the past year.  Patient states he still smokes about 4 cigarettes a day.  He states he saw a specialist in [place] but does not know what his lung problem is.  PCP Sinda Du, MD   Past Medical History:  Diagnosis Date  . Bipolar disorder ([place])   . Diabetes mellitus without complication ([place])   . Hyperlipidemia   . Pancreatitis     Patient Active Problem List   Diagnosis Date Noted  . 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 Chronic respiratory failure with hypoxia ([place]) 02/16/2018  . ILD (interstitial lung disease) ([place])   . Hypogonadism male 09/12/2014  . Uncontrolled type 1 diabetes mellitus ([place]) 02/18/2014  . Cholelithiasis 06/06/2013  . Bipolar 1 disorder, mixed, moderate ([place]) 06/04/2013  . Abnormal LFTs 06/03/2013  . Pancreatitis, acute 06/03/2013  . DKA (diabetic ketoacidosis) ([place]) 06/03/2013  . DKA (diabetic ketoacidoses) ([place]) 06/03/2013    Past Surgical History:  Procedure Laterality Date  . APPENDECTOMY    . CHOLECYSTECTOMY N/A 07/11/2013   Procedure: LAPAROSCOPIC CHOLECYSTECTOMY;  Surgeon: Jamesetta So, MD;  Location: [facility];  Service: General;  Laterality: N/A;  . KNEE SURGERY Bilateral   . VIDEO BRONCHOSCOPY Bilateral 02/09/2018   Procedure: VIDEO BRONCHOSCOPY WITHOUT FLUORO;  Surgeon: Brand Males, MD;  Location: [facility];  Service: Cardiopulmonary;  Laterality: Bilateral;        Home Medications    Prior to Admission medications   Medication Sig Start Date End Date Taking? Authorizing Provider  amoxicillin (AMOXIL) 500 MG capsule Take 500 mg by mouth at bedtime.    [provider]  divalproex (DEPAKOTE) 500 MG DR tablet Take 1,000 mg by mouth at bedtime.    [provider]  doxycycline (VIBRAMYCIN) 100 MG capsule Take 1 capsule (100 mg total) by mouth 2 (two) times daily. 07/27/18   Rolland Porter, MD  glucose blood (ONETOUCH VERIO) test strip 1 each by Other route 2 (two) times daily. And lancets 2/day 03/08/18   Renato Shin, MD  insulin aspart (NOVOLOG FLEXPEN) 100 UNIT/ML FlexPen 50 units with breakfast, and 30 units with supper. 07/12/18   Renato Shin, MD  Insulin Glargine (LANTUS SOLOSTAR) 100 UNIT/ML Solostar Pen Inject 100 Units into the skin every morning. 03/08/18   Renato Shin, MD  lisinopril (PRINIVIL,ZESTRIL) 20 MG tablet Take 20 mg by mouth at bedtime.    [provider]  omeprazole (PRILOSEC) 20 MG capsule Take 20 mg by mouth at bedtime.  [provider]  predniSONE (DELTASONE) 20 MG tablet Take 3 tablets (60 mg total) by mouth daily with breakfast. 02/16/18   Parrett, Fonnie Mu, NP  risperidone (RISPERDAL) 4 MG tablet Take 4 mg by mouth at bedtime.     [provider]  sildenafil (VIAGRA) 100 MG tablet Take 0.5-1 tablets (50-100 mg total) daily as needed by mouth for erectile dysfunction. 04/08/17   Renato Shin, MD  simvastatin (ZOCOR) 20 MG tablet Take 20 mg by mouth at bedtime.    [provider]  solifenacin (VESICARE) 5 MG tablet Take 5 mg by mouth at bedtime.     [provider]     Family History Family History  Problem Relation Age of Onset  . Diabetes Mother   . Diabetes Maternal Grandmother   . Pancreatitis Neg Hx   . Colon cancer Neg Hx     Social History Social History   Tobacco Use  . Smoking status: Current Some Day Smoker    Packs/day: 0.50    Years: 10.00    Pack years: 5.00    Types: Cigarettes  . Smokeless tobacco: Never Used  Substance Use Topics  . Alcohol use: No  . Drug use: No  On disability for bipolar disorder States he still smokes 4 cigarettes a day   Allergies   Patient has no known allergies.   Review of Systems Review of Systems  All other systems reviewed and are negative.    Physical Exam Updated Vital Signs BP (!) 161/109 (BP Location: Right Arm)   Pulse (!) 109   Temp (!) 97.4 F (36.3 C) (Oral)   Resp (!) 32   Ht 6\' 1"  (1.854 m)   Wt 100.2 kg   SpO2 94%   BMI 29.16 kg/m   Vital signs normal except tachycardia   Physical Exam Vitals signs and nursing note reviewed.  Constitutional:      General: He is not in acute distress.    Appearance: Normal appearance. He is well-developed. He is not ill-appearing or toxic-appearing.  HENT:     Head: Normocephalic and atraumatic.     Comments: Patient has moon facies    Right Ear: External ear normal.     Left Ear: External ear normal.     Nose: Nose normal. No mucosal edema or rhinorrhea.     Mouth/Throat:     Mouth: Mucous membranes are dry.     Dentition: No dental abscesses.     Pharynx: No uvula swelling.  Eyes:     Conjunctiva/sclera: Conjunctivae normal.     Pupils: Pupils are equal, round, and reactive to light.  Neck:     Musculoskeletal: Full passive range of motion without pain, normal range of motion and neck supple.  Cardiovascular:     Rate and Rhythm: Normal rate and regular rhythm.     Heart sounds: Normal heart sounds. No murmur. No friction rub. No gallop.   Pulmonary:     Effort: Pulmonary effort is normal. Tachypnea and  prolonged expiration present. No accessory muscle usage or respiratory distress.     Breath sounds: Wheezing present. No rhonchi or rales.  Chest:     Chest wall: No tenderness or crepitus.  Abdominal:     General: Bowel sounds are normal. There is no distension.     Palpations: Abdomen is soft.     Tenderness: There is no abdominal tenderness. There is no guarding or rebound.  Musculoskeletal: Normal range of motion.  General: No tenderness.     Comments: Moves all extremities well.   Skin:    General: Skin is warm and dry.     Coloration: Skin is not pale.     Findings: No erythema or rash.  Neurological:     Mental Status: He is alert and oriented to person, place, and time.     Cranial Nerves: No cranial nerve deficit.  Psychiatric:        Mood and Affect: Mood is not anxious.        Speech: Speech normal.        Behavior: Behavior normal.      ED Treatments / Results  Labs (all labs ordered are listed, but only abnormal results are displayed) Labs Reviewed  CBG MONITORING, ED    EKG EKG Interpretation  Date/Time:  Tuesday July 27 2018 01:13:17 EST Ventricular Rate:  109 PR Interval:    QRS Duration: 76 QT Interval:  320 QTC Calculation: 431 R Axis:   77 Text Interpretation:  Sinus tachycardia Since last tracing rate faster 16 Aug 2006 peaked T waves Confirmed by Rolland Porter [phone]) on 07/27/2018 2:10:11 AM   Radiology Dg Chest 2 View  Result Date: 07/27/2018 CLINICAL DATA:  43 year old male with shortness of breath. EXAM: CHEST - 2 VIEW COMPARISON:  Chest radiograph dated 04/27/2018 FINDINGS: Diffuse interstitial coarsening in keeping with fibrosis. No focal consolidation, pleural effusion, or pneumothorax. Stable cardiac silhouette. No acute osseous pathology. IMPRESSION: 1. No acute cardiopulmonary process. 2. Stable pulmonary fibrosis. Electronically Signed   By: Anner Crete M.D.   On: 07/27/2018 01:57    Procedures Procedures (including  critical care time)  Medications Ordered in ED Medications  aerochamber Z-Stat Plus/medium 1 each (has no administration in time range)  doxycycline (VIBRA-TABS) tablet 100 mg (has no administration in time range)  albuterol (PROVENTIL,VENTOLIN) solution continuous neb (10 mg/hr Nebulization Given 07/27/18 0140)  albuterol (PROVENTIL,VENTOLIN) solution continuous neb (10 mg/hr Nebulization Given 07/27/18 0310)     Initial Impression / Assessment and Plan / ED Course  I have reviewed the triage vital signs and the nursing notes.  Pertinent labs & imaging results that were available during my care of the patient were reviewed by me and considered in my medical decision making (see chart for details).      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  He has been on oxygen at night and is to use oxygen as needed to keep his pulse ox over 90%.  He is followed by Dr. Chase Caller  Patient was given a continuous nebulizer.  CBG was done due to his underlying diabetes.  Recheck at 2:50 AM patient has finished his continuous nebulizer.  He states he is feeling better but feels like he still has wheezing.  When I examined him he does have diffuse wheezing but he has much improved air movement and the pitches are getting lower.  He was given a second continuous nebulizer.  He states he had reactive airway disease as a child and would have to be admitted.  Recheck at 3:45 AM patient has finished his second continuous nebulizer.  He had just ambulated from the bathroom and appears to be more short of breath.  He states however he feels like his wheezing is better.  When I listen to him he has some diffuse rales but I do not feel like he is wheezing.  He has improved air  movement.  I suspect with his underlying pulmonary fibrosis I may just be hearing that scar tissue.  His pulse ox was 91% when I put the pulse ox back on  his finger and quickly went back up to 98%.  He states he feels like he can go home.  He states he gets short of breath with exertion normally.  We discussed using his inhaler and he does not have a spacer so he was given 1 tonight.  He is still on the prednisone 60 mg.  He states since he has had the treatments here he starting to cough up some yellow mucus, I will start him on some antibiotics.  Final Clinical Impressions(s) / ED Diagnoses   Final diagnoses:  Moderate asthma with exacerbation, unspecified whether persistent  Bronchitis    ED Discharge Orders         Ordered    doxycycline (VIBRAMYCIN) 100 MG capsule  2 times daily     07/27/18 0402          Plan discharge  Rolland Porter, MD, Barbette Or, MD 07/27/18 [phone]

## 2018-07-27 NOTE — ED Triage Notes (Signed)
Pt c/o sob that got worse tonight pt states hes on temporary oxygen at night and prednisone.

## 2018-07-27 NOTE — Discharge Instructions (Addendum)
Use the spacer with your inhaler to make it more effective. Take the antibiotics until gone. Continue your prednisone. Return if you get worse. Let Dr Kathaleen Grinder office know you came to the ED tonight. He may want to recheck you in a couple of days.

## 2018-09-14 ENCOUNTER — Ambulatory Visit (INDEPENDENT_AMBULATORY_CARE_PROVIDER_SITE_OTHER): Payer: Medicaid Other | Admitting: Endocrinology

## 2018-09-14 ENCOUNTER — Other Ambulatory Visit: Payer: Self-pay

## 2018-09-14 ENCOUNTER — Encounter: Payer: Self-pay | Admitting: Endocrinology

## 2018-09-14 DIAGNOSIS — E10621 Type 1 diabetes mellitus with foot ulcer: Secondary | ICD-10-CM

## 2018-09-14 DIAGNOSIS — E1029 Type 1 diabetes mellitus with other diabetic kidney complication: Secondary | ICD-10-CM

## 2018-09-14 DIAGNOSIS — J8410 Pulmonary fibrosis, unspecified: Secondary | ICD-10-CM

## 2018-09-14 DIAGNOSIS — L97509 Non-pressure chronic ulcer of other part of unspecified foot with unspecified severity: Secondary | ICD-10-CM

## 2018-09-14 DIAGNOSIS — F3162 Bipolar disorder, current episode mixed, moderate: Secondary | ICD-10-CM

## 2018-09-14 MED ORDER — INSULIN GLARGINE 100 UNIT/ML SOLOSTAR PEN: 110.0000 [IU] | PEN_INJECTOR | SUBCUTANEOUS | 11 refills | Status: DC

## 2018-09-14 MED ORDER — INSULIN ASPART 100 UNIT/ML FLEXPEN: PEN_INJECTOR | SUBCUTANEOUS | 11 refills | Status: DC

## 2018-09-14 NOTE — Patient Instructions (Addendum)
check your blood sugar twice a day.  vary the time of day when you check, between before the 3 meals, and at bedtime.  also check if you have symptoms of your blood sugar being too high or too low.  please keep a record of the readings and bring it to your next appointment here (or you can bring the meter itself).  You can write it on any piece of paper.  please call us sooner if your blood sugar goes below 70, or if you have a lot of readings over 200. Please increase the lantus to 110 units each morning, and: Please continue the same novolog.   On this type of insulin schedule, you should eat meals on a regular schedule.  If a meal is missed or significantly delayed, your blood sugar could go low.  Please come back for a follow-up appointment in 1 month.

## 2018-09-14 NOTE — Progress Notes (Addendum)
Subjective:    Patient ID: [name], male    DOB: Mar 16, 1976, 43 y.o.   MRN: [id number]  HPI  telehealth visit today via Phone visit, x 10 minutes Alternatives to telehealth are presented to this patient, and the patient agrees to the telehealth visit. Pt is advised of the cost of the visit, and agrees to this, also.   Patient is at home, and I am at the office.   Pt returns for f/u of diabetes mellitus: DM type: 1 Dx'ed: 7017 Complications: renal insuff and foot ulcer Therapy: insulin since dx.   DKA: he has had 3 episodes since dx--each with going off his insulin.  Severe hypoglycemia: never.   Pancreatitis: he had gallstone pancreatitis in late 2014.  Other: we discussed different insulin regimens, and he chooses qd lantus, and BID novolog.  He self-administers insulin.   Interval history: He says he never misses the insulin. He takes prednisone for pulm fibrosis.  No recent change in prednisone dosage.  He says cbg varies from 80-300's.  It is in general lowest in the afternoon.  Polyuria is less now.   Past Medical History:  Diagnosis Date  . Bipolar disorder ([place])   . Diabetes mellitus without complication ([place])   . Hyperlipidemia   . Pancreatitis     Past Surgical History:  Procedure Laterality Date  . APPENDECTOMY    . CHOLECYSTECTOMY N/A 07/11/2013   Procedure: LAPAROSCOPIC CHOLECYSTECTOMY;  Surgeon: Jamesetta So, MD;  Location: [facility];  Service: General;  Laterality: N/A;  . KNEE SURGERY Bilateral   . VIDEO BRONCHOSCOPY Bilateral 02/09/2018   Procedure: VIDEO BRONCHOSCOPY WITHOUT FLUORO;  Surgeon: Brand Males, MD;  Location: [facility];  Service: Cardiopulmonary;  Laterality: Bilateral;    Social History   Socioeconomic History  . Marital status: Single    Spouse name: Not on file  . Number of children: 0  . Years of education: Not on file  . Highest education level: Not on file  Occupational History  . Not on file  Social Needs  .  Financial resource strain: Not on file  . Food insecurity:    Worry: Not on file    Inability: Not on file  . Transportation needs:    Medical: Not on file    Non-medical: Not on file  Tobacco Use  . Smoking status: Current Some Day Smoker    Packs/day: 0.50    Years: 10.00    Pack years: 5.00    Types: Cigarettes  . Smokeless tobacco: Never Used  Substance and Sexual Activity  . Alcohol use: No  . Drug use: No  . Sexual activity: Never  Lifestyle  . Physical activity:    Days per week: Not on file    Minutes per session: Not on file  . Stress: Not on file  Relationships  . Social connections:    Talks on phone: Not on file    Gets together: Not on file    Attends religious service: Not on file    Active member of club or organization: Not on file    Attends meetings of clubs or organizations: Not on file    Relationship status: Not on file  . Intimate partner violence:    Fear of current or ex partner: Not on file    Emotionally abused: Not on file    Physically abused: Not on file    Forced sexual activity: Not on file  Other Topics Concern  . Not on  file  Social History Narrative  . Not on file    Current Outpatient Medications on File Prior to Visit  Medication Sig Dispense Refill  . amoxicillin (AMOXIL) 500 MG capsule Take 500 mg by mouth at bedtime.    . divalproex (DEPAKOTE) 500 MG DR tablet Take 1,000 mg by mouth at bedtime.    Marland Kitchen glucose blood (ONETOUCH VERIO) test strip 1 each by Other route 2 (two) times daily. And lancets 2/day 100 each 12  . lisinopril (PRINIVIL,ZESTRIL) 20 MG tablet Take 20 mg by mouth at bedtime.    Marland Kitchen omeprazole (PRILOSEC) 20 MG capsule Take 20 mg by mouth at bedtime.    . predniSONE (DELTASONE) 20 MG tablet Take 3 tablets (60 mg total) by mouth daily with breakfast. 90 tablet 0  . risperidone (RISPERDAL) 4 MG tablet Take 4 mg by mouth at bedtime.     . sildenafil (VIAGRA) 100 MG tablet Take 0.5-1 tablets (50-100 mg total) daily as  needed by mouth for erectile dysfunction. 30 tablet 11  . simvastatin (ZOCOR) 20 MG tablet Take 20 mg by mouth at bedtime.    . solifenacin (VESICARE) 5 MG tablet Take 5 mg by mouth at bedtime.      No current facility-administered medications on file prior to visit.     No Known Allergies  Family History  Problem Relation Age of Onset  . Diabetes Mother   . Diabetes Maternal Grandmother   . Pancreatitis Neg Hx   . Colon cancer Neg Hx      Review of Systems He denies hypoglycemia.      Objective:   Physical Exam   Lab Results  Component Value Date   HGBA1C 9.8 (A) 07/12/2018   Lab Results  Component Value Date   CREATININE 1.27 07/05/2013   BUN 27 (H) 07/05/2013   NA 140 07/11/2013   K 4.3 07/11/2013   CL 93 (L) 07/05/2013   CO2 20 07/05/2013      Assessment & Plan:  Type 1 DM, with foot ulcer: he needs increased rx.   pulm fibrosis: we discussed the fact that if prednisone is reduced, insulin need will also decrease.   Renal insuff: he is at risk for fasting hypoglycemia.  He is checking for this  Patient Instructions  check your blood sugar twice a day.  vary the time of day when you check, between before the 3 meals, and at bedtime.  also check if you have symptoms of your blood sugar being too high or too low.  please keep a record of the readings and bring it to your next appointment here (or you can bring the meter itself).  You can write it on any piece of paper.  please call us sooner if your blood sugar goes below 70, or if you have a lot of readings over 200. Please increase the lantus to 110 units each morning, and: Please continue the same novolog.   On this type of insulin schedule, you should eat meals on a regular schedule.  If a meal is missed or significantly delayed, your blood sugar could go low.  Please come back for a follow-up appointment in 1 month.

## 2019-01-04 ENCOUNTER — Other Ambulatory Visit (HOSPITAL_COMMUNITY): Payer: Self-pay | Admitting: Pulmonary Disease

## 2019-01-04 ENCOUNTER — Ambulatory Visit (HOSPITAL_COMMUNITY)
Admission: RE | Admit: 2019-01-04 | Discharge: 2019-01-04 | Disposition: A | Discharge status: Home / Self Care | Payer: Medicaid Other | Source: Ambulatory Visit | Attending: Pulmonary Disease | Admitting: Pulmonary Disease

## 2019-01-04 ENCOUNTER — Other Ambulatory Visit: Payer: Self-pay

## 2019-01-04 DIAGNOSIS — J42 Unspecified chronic bronchitis: Secondary | ICD-10-CM | POA: Insufficient documentation

## 2019-01-24 ENCOUNTER — Other Ambulatory Visit: Payer: Self-pay

## 2019-01-26 ENCOUNTER — Other Ambulatory Visit: Payer: Self-pay

## 2019-01-26 ENCOUNTER — Ambulatory Visit (INDEPENDENT_AMBULATORY_CARE_PROVIDER_SITE_OTHER): Payer: Medicaid Other | Admitting: Endocrinology

## 2019-01-26 ENCOUNTER — Encounter: Payer: Self-pay | Admitting: Endocrinology

## 2019-01-26 VITALS — BP 152/88 | HR 131 | Ht 73.0 in | Wt 244.6 lb

## 2019-01-26 DIAGNOSIS — Z9114 Patient's other noncompliance with medication regimen: Secondary | ICD-10-CM | POA: Diagnosis not present

## 2019-01-26 DIAGNOSIS — E1065 Type 1 diabetes mellitus with hyperglycemia: Secondary | ICD-10-CM

## 2019-01-26 DIAGNOSIS — F3162 Bipolar disorder, current episode mixed, moderate: Secondary | ICD-10-CM

## 2019-01-26 LAB — T4, FREE: Free T4: 0.86 ng/dL (ref 0.60–1.60)

## 2019-01-26 LAB — TSH: TSH: 1.36 u[IU]/mL (ref 0.35–4.50)

## 2019-01-26 LAB — POCT GLYCOSYLATED HEMOGLOBIN (HGB A1C): Hemoglobin A1C: 14 % — AB (ref 4.0–5.6)

## 2019-01-26 LAB — BASIC METABOLIC PANEL
BUN: 10 mg/dL (ref 6–23)
CO2: 29 mEq/L (ref 19–32)
Calcium: 9.5 mg/dL (ref 8.4–10.5)
Chloride: 95 mEq/L — ABNORMAL LOW (ref 96–112)
Creatinine, Ser: 0.74 mg/dL (ref 0.40–1.50)
GFR: 140.61 mL/min (ref >60.00)
Glucose, Bld: 429 mg/dL — ABNORMAL HIGH (ref 70–99)
Potassium: 3.6 mEq/L (ref 3.5–5.1)
Sodium: 136 mEq/L (ref 135–145)

## 2019-01-26 MED ORDER — LANTUS SOLOSTAR 100 UNIT/ML ~~LOC~~ SOPN: 170.0000 [IU] | PEN_INJECTOR | SUBCUTANEOUS | 11 refills | Status: DC

## 2019-01-26 NOTE — Patient Instructions (Addendum)
Your blood pressure is high today.  Please see your primary care provider soon, to have it rechecked check your blood sugar twice a day.  vary the time of day when you check, between before the 3 meals, and at bedtime.  also check if you have symptoms of your blood sugar being too high or too low.  please keep a record of the readings and bring it to your next appointment here (or you can bring the meter itself).  You can write it on any piece of paper.  please call us sooner if your blood sugar goes below 70, or if you have a lot of readings over 200. Please increase the lantus to 170 units each morning, and: Please stop taking the novolog.   On this type of insulin schedule, you should eat meals on a regular schedule.  If a meal is missed or significantly delayed, your blood sugar could go low.  Blood tests are requested for you today.  We'll let you know about the results.   Please come back for a follow-up appointment in 1 month.

## 2019-01-26 NOTE — Progress Notes (Signed)
Subjective:    Patient ID: [name], male    DOB: 07-02-1975, 43 y.o.   MRN: [id number]  HPI Pt returns for f/u of diabetes mellitus: DM type: 1 Dx'ed: 123XX123 Complications: renal insuff and foot ulcer.  Therapy: insulin since dx.   DKA: he has had 3 episodes since dx--each with going off his insulin.  Severe hypoglycemia: never.   Pancreatitis: he had gallstone pancreatitis in late 2014.  Other: we discussed different insulin regimens, and he chooses qd lantus, and BID novolog.  He self-administers insulin;  He takes prednisone for pulm fibrosis. Interval history: He says he has been missing the insulin.    No recent change in prednisone dosage.  no cbg record, but he says cbg's are in the 300's.   Past Medical History:  Diagnosis Date  . Bipolar disorder ([place])   . Diabetes mellitus without complication ([place])   . Hyperlipidemia   . Pancreatitis     Past Surgical History:  Procedure Laterality Date  . APPENDECTOMY    . CHOLECYSTECTOMY N/A 07/11/2013   Procedure: LAPAROSCOPIC CHOLECYSTECTOMY;  Surgeon: Jamesetta So, MD;  Location: [facility];  Service: General;  Laterality: N/A;  . KNEE SURGERY Bilateral   . VIDEO BRONCHOSCOPY Bilateral 02/09/2018   Procedure: VIDEO BRONCHOSCOPY WITHOUT FLUORO;  Surgeon: Brand Males, MD;  Location: [facility];  Service: Cardiopulmonary;  Laterality: Bilateral;    Social History   Socioeconomic History  . Marital status: Single    Spouse name: Not on file  . Number of children: 0  . Years of education: Not on file  . Highest education level: Not on file  Occupational History  . Not on file  Social Needs  . Financial resource strain: Not on file  . Food insecurity    Worry: Not on file    Inability: Not on file  . Transportation needs    Medical: Not on file    Non-medical: Not on file  Tobacco Use  . Smoking status: Current Some Day Smoker    Packs/day: 0.50    Years: 10.00    Pack years: 5.00    Types: Cigarettes   . Smokeless tobacco: Never Used  Substance and Sexual Activity  . Alcohol use: No  . Drug use: No  . Sexual activity: Never  Lifestyle  . Physical activity    Days per week: Not on file    Minutes per session: Not on file  . Stress: Not on file  Relationships  . Social Herbalist on phone: Not on file    Gets together: Not on file    Attends religious service: Not on file    Active member of club or organization: Not on file    Attends meetings of clubs or organizations: Not on file    Relationship status: Not on file  . Intimate partner violence    Fear of current or ex partner: Not on file    Emotionally abused: Not on file    Physically abused: Not on file    Forced sexual activity: Not on file  Other Topics Concern  . Not on file  Social History Narrative  . Not on file    Current Outpatient Medications on File Prior to Visit  Medication Sig Dispense Refill  . amoxicillin (AMOXIL) 500 MG capsule Take 500 mg by mouth at bedtime.    . divalproex (DEPAKOTE) 500 MG DR tablet Take 1,000 mg by mouth at bedtime.    Marland Kitchen  glucose blood (ONETOUCH VERIO) test strip 1 each by Other route 2 (two) times daily. And lancets 2/day 100 each 12  . lisinopril (PRINIVIL,ZESTRIL) 20 MG tablet Take 20 mg by mouth at bedtime.    Marland Kitchen omeprazole (PRILOSEC) 20 MG capsule Take 20 mg by mouth at bedtime.    . predniSONE (DELTASONE) 20 MG tablet Take 3 tablets (60 mg total) by mouth daily with breakfast. 90 tablet 0  . risperidone (RISPERDAL) 4 MG tablet Take 4 mg by mouth at bedtime.     . sildenafil (VIAGRA) 100 MG tablet Take 0.5-1 tablets (50-100 mg total) daily as needed by mouth for erectile dysfunction. 30 tablet 11  . simvastatin (ZOCOR) 20 MG tablet Take 20 mg by mouth at bedtime.    . solifenacin (VESICARE) 5 MG tablet Take 5 mg by mouth at bedtime.      No current facility-administered medications on file prior to visit.     No Known Allergies  Family History  Problem Relation  Age of Onset  . Diabetes Mother   . Diabetes Maternal Grandmother   . Pancreatitis Neg Hx   . Colon cancer Neg Hx     BP (!) 152/88 (BP Location: Left Arm, Patient Position: Sitting, Cuff Size: Large)   Pulse (!) 131   Ht 6\' 1"  (1.854 m)   Wt 244 lb 9.6 oz (110.9 kg)   SpO2 (!) 89%   BMI 32.27 kg/m    Review of Systems Denies n/v    Objective:   Physical Exam VITAL SIGNS:  See vs page GENERAL: no distress Pulses: dorsalis pedis intact bilat.   MSK: no deformity of the feet CV: trace bilat leg edema Skin:  no ulcer on the feet.  normal color and temp on the feet. Neuro: sensation is intact to touch on the feet  Lab Results  Component Value Date   HGBA1C 14.0 (A) 01/26/2019   Lab Results  Component Value Date   CREATININE 0.74 01/26/2019   BUN 10 01/26/2019   NA 136 01/26/2019   K 3.6 01/26/2019   CL 95 (L) 01/26/2019   CO2 29 01/26/2019      Assessment & Plan:  HTN: is noted today.  Type 1 DM: worse.  Noncompliance with insulin: we'll try to change to a QD insulin regimen.   Patient Instructions  Your blood pressure is high today.  Please see your primary care provider soon, to have it rechecked check your blood sugar twice a day.  vary the time of day when you check, between before the 3 meals, and at bedtime.  also check if you have symptoms of your blood sugar being too high or too low.  please keep a record of the readings and bring it to your next appointment here (or you can bring the meter itself).  You can write it on any piece of paper.  please call us sooner if your blood sugar goes below 70, or if you have a lot of readings over 200. Please increase the lantus to 170 units each morning, and: Please stop taking the novolog.   On this type of insulin schedule, you should eat meals on a regular schedule.  If a meal is missed or significantly delayed, your blood sugar could go low.  Blood tests are requested for you today.  We'll let you know about the  results.   Please come back for a follow-up appointment in 1 month.

## 2019-02-01 ENCOUNTER — Other Ambulatory Visit (HOSPITAL_COMMUNITY): Payer: Self-pay | Admitting: Pulmonary Disease

## 2019-02-01 ENCOUNTER — Other Ambulatory Visit: Payer: Self-pay | Admitting: Pulmonary Disease

## 2019-02-01 DIAGNOSIS — M7989 Other specified soft tissue disorders: Secondary | ICD-10-CM

## 2019-02-02 ENCOUNTER — Ambulatory Visit (HOSPITAL_COMMUNITY)
Admission: RE | Admit: 2019-02-02 | Discharge: 2019-02-02 | Disposition: A | Discharge status: Home / Self Care | Payer: Medicaid Other | Source: Ambulatory Visit | Attending: Pulmonary Disease | Admitting: Pulmonary Disease

## 2019-02-02 ENCOUNTER — Other Ambulatory Visit: Payer: Self-pay

## 2019-02-02 DIAGNOSIS — M7989 Other specified soft tissue disorders: Secondary | ICD-10-CM | POA: Diagnosis not present

## 2019-02-16 LAB — HM DIABETES EYE EXAM

## 2019-02-21 ENCOUNTER — Other Ambulatory Visit: Payer: Self-pay

## 2019-02-22 ENCOUNTER — Other Ambulatory Visit (HOSPITAL_COMMUNITY): Payer: Self-pay | Admitting: Pulmonary Disease

## 2019-02-22 DIAGNOSIS — R609 Edema, unspecified: Secondary | ICD-10-CM

## 2019-02-23 ENCOUNTER — Ambulatory Visit: Payer: Medicaid Other | Admitting: Endocrinology

## 2019-03-03 ENCOUNTER — Other Ambulatory Visit: Payer: Self-pay

## 2019-03-03 ENCOUNTER — Ambulatory Visit (HOSPITAL_COMMUNITY)
Admission: RE | Admit: 2019-03-03 | Discharge: 2019-03-03 | Disposition: A | Discharge status: Home / Self Care | Payer: Medicaid Other | Source: Ambulatory Visit | Attending: Pulmonary Disease | Admitting: Pulmonary Disease

## 2019-03-03 DIAGNOSIS — R609 Edema, unspecified: Secondary | ICD-10-CM | POA: Insufficient documentation

## 2019-03-03 NOTE — Progress Notes (Signed)
*  PRELIMINARY RESULTS* Echocardiogram 2D Echocardiogram has been performed.  [name] 03/03/2019, 3:28 PM

## 2019-03-16 ENCOUNTER — Other Ambulatory Visit: Payer: Self-pay

## 2019-03-16 ENCOUNTER — Encounter: Payer: Self-pay | Admitting: Endocrinology

## 2019-03-16 ENCOUNTER — Ambulatory Visit (INDEPENDENT_AMBULATORY_CARE_PROVIDER_SITE_OTHER): Payer: Medicaid Other | Admitting: Endocrinology

## 2019-03-16 VITALS — BP 152/98 | HR 125 | Ht 73.0 in | Wt 257.2 lb

## 2019-03-16 DIAGNOSIS — E10621 Type 1 diabetes mellitus with foot ulcer: Secondary | ICD-10-CM

## 2019-03-16 DIAGNOSIS — E1065 Type 1 diabetes mellitus with hyperglycemia: Secondary | ICD-10-CM

## 2019-03-16 LAB — POCT GLYCOSYLATED HEMOGLOBIN (HGB A1C): Hemoglobin A1C: 11 % — AB (ref 4.0–5.6)

## 2019-03-16 MED ORDER — NOVOLIN N FLEXPEN 100 UNIT/ML ~~LOC~~ SUPN: 150.0000 [IU] | PEN_INJECTOR | SUBCUTANEOUS | 11 refills | Status: DC

## 2019-03-16 NOTE — Progress Notes (Signed)
Subjective:    Patient ID: [name], male    DOB: 1975-07-06, 43 y.o.   MRN: [id number]  HPI Pt returns for f/u of diabetes mellitus: DM type: 1 Dx'ed: 123XX123 Complications: foot ulcer.  Therapy: insulin since dx.   DKA: he has had 3 episodes since dx--each with going off his insulin.  Severe hypoglycemia: never.   Pancreatitis: he had gallstone pancreatitis in late 2014.  Other: we takes QD insulin, due to noncompliance with more frequent dosing.  He self-administers insulin;  He takes prednisone for pulm fibrosis. Interval history: He says he has not recently been missing the insulin.  No recent change in prednisone dosage.  no cbg record, but he says cbg's vary from 99-250.  It is in general higher as the day goes on.   Past Medical History:  Diagnosis Date  . Bipolar disorder ([place])   . Diabetes mellitus without complication ([place])   . Hyperlipidemia   . Pancreatitis     Past Surgical History:  Procedure Laterality Date  . APPENDECTOMY    . CHOLECYSTECTOMY N/A 07/11/2013   Procedure: LAPAROSCOPIC CHOLECYSTECTOMY;  Surgeon: Jamesetta So, MD;  Location: [facility];  Service: General;  Laterality: N/A;  . KNEE SURGERY Bilateral   . VIDEO BRONCHOSCOPY Bilateral 02/09/2018   Procedure: VIDEO BRONCHOSCOPY WITHOUT FLUORO;  Surgeon: Brand Males, MD;  [facility];  Service: Cardiopulmonary;  Laterality: Bilateral;    Social History   Socioeconomic History  . Marital status: Single    Spouse name: Not on file  . Number of children: 0  . Years of education: Not on file  . Highest education level: Not on file  Occupational History  . Not on file  Social Needs  . Financial resource strain: Not on file  . Food insecurity    Worry: Not on file    Inability: Not on file  . Transportation needs    Medical: Not on file    Non-medical: Not on file  Tobacco Use  . Smoking status: Current Some Day Smoker    Packs/day: 0.50    Years: 10.00    Pack years: 5.00     Types: Cigarettes  . Smokeless tobacco: Never Used  Substance and Sexual Activity  . Alcohol use: No  . Drug use: No  . Sexual activity: Never  Lifestyle  . Physical activity    Days per week: Not on file    Minutes per session: Not on file  . Stress: Not on file  Relationships  . Social Herbalist on phone: Not on file    Gets together: Not on file    Attends religious service: Not on file    Active member of club or organization: Not on file    Attends meetings of clubs or organizations: Not on file    Relationship status: Not on file  . Intimate partner violence    Fear of current or ex partner: Not on file    Emotionally abused: Not on file    Physically abused: Not on file    Forced sexual activity: Not on file  Other Topics Concern  . Not on file  Social History Narrative  . Not on file    Current Outpatient Medications on File Prior to Visit  Medication Sig Dispense Refill  . amoxicillin (AMOXIL) 500 MG capsule Take 500 mg by mouth at bedtime.    . divalproex (DEPAKOTE) 500 MG DR tablet Take 1,000 mg by mouth  at bedtime.    Marland Kitchen glucose blood (ONETOUCH VERIO) test strip 1 each by Other route 2 (two) times daily. And lancets 2/day 100 each 12  . lisinopril (PRINIVIL,ZESTRIL) 20 MG tablet Take 20 mg by mouth at bedtime.    Marland Kitchen omeprazole (PRILOSEC) 20 MG capsule Take 20 mg by mouth at bedtime.    . predniSONE (DELTASONE) 20 MG tablet Take 3 tablets (60 mg total) by mouth daily with breakfast. 90 tablet 0  . risperidone (RISPERDAL) 4 MG tablet Take 4 mg by mouth at bedtime.     . sildenafil (VIAGRA) 100 MG tablet Take 0.5-1 tablets (50-100 mg total) daily as needed by mouth for erectile dysfunction. 30 tablet 11  . simvastatin (ZOCOR) 20 MG tablet Take 20 mg by mouth at bedtime.    . solifenacin (VESICARE) 5 MG tablet Take 5 mg by mouth at bedtime.      No current facility-administered medications on file prior to visit.     No Known Allergies  Family  History  Problem Relation Age of Onset  . Diabetes Mother   . Diabetes Maternal Grandmother   . Pancreatitis Neg Hx   . Colon cancer Neg Hx     BP (!) 152/98 (BP Location: Left Arm, Patient Position: Sitting, Cuff Size: Normal)   Pulse (!) 125   Ht 6\' 1"  (1.854 m)   Wt 257 lb 3.2 oz (116.7 kg)   SpO2 90%   BMI 33.93 kg/m    Review of Systems He denies hypoglycemia.      Objective:   Physical Exam VITAL SIGNS:  See vs page GENERAL: no distress Pulses: dorsalis pedis intact bilat.   MSK: no deformity of the feet CV: no leg edema Skin:  no ulcer on the feet.  normal color and temp on the feet. Neuro: sensation is intact to touch on the feet  Lab Results  Component Value Date   TSH 1.36 01/26/2019   Lab Results  Component Value Date   CREATININE 0.74 01/26/2019   BUN 10 01/26/2019   NA 136 01/26/2019   K 3.6 01/26/2019   CL 95 (L) 01/26/2019   CO2 29 01/26/2019       Assessment & Plan:  HTN: is noted today Type 1 DM, with foot ulcer: The pattern of his cbg's indicates he needs a faster-acting qam insulin.  pulm fibrosis: we'll follow prednisone dosage.   Patient Instructions  Your blood pressure is high today.  Please see your heart doctor soon, to have it rechecked, and also to see why your heart racing.   check your blood sugar twice a day.  vary the time of day when you check, between before the 3 meals, and at bedtime.  also check if you have symptoms of your blood sugar being too high or too low.  please keep a record of the readings and bring it to your next appointment here (or you can bring the meter itself).  You can write it on any piece of paper.  please call us sooner if your blood sugar goes below 70, or if you have a lot of readings over 200. Please change the lantus to NPH, 150 units each morning, and:   On this type of insulin schedule, you should eat meals on a regular schedule.  If a meal is missed or significantly delayed, your blood sugar could go  low.  Please come back for a follow-up appointment in 1 month.

## 2019-03-16 NOTE — Patient Instructions (Addendum)
Your blood pressure is high today.  Please see your heart doctor soon, to have it rechecked, and also to see why your heart racing.   check your blood sugar twice a day.  vary the time of day when you check, between before the 3 meals, and at bedtime.  also check if you have symptoms of your blood sugar being too high or too low.  please keep a record of the readings and bring it to your next appointment here (or you can bring the meter itself).  You can write it on any piece of paper.  please call us sooner if your blood sugar goes below 70, or if you have a lot of readings over 200. Please change the lantus to NPH, 150 units each morning, and:   On this type of insulin schedule, you should eat meals on a regular schedule.  If a meal is missed or significantly delayed, your blood sugar could go low.  Please come back for a follow-up appointment in 1 month.

## 2019-03-24 ENCOUNTER — Other Ambulatory Visit: Payer: Self-pay | Admitting: Endocrinology

## 2019-03-30 DIAGNOSIS — F319 Bipolar disorder, unspecified: Secondary | ICD-10-CM

## 2019-03-30 DIAGNOSIS — I10 Essential (primary) hypertension: Secondary | ICD-10-CM | POA: Diagnosis present

## 2019-04-01 ENCOUNTER — Other Ambulatory Visit: Payer: Self-pay

## 2019-04-01 ENCOUNTER — Telehealth: Payer: Self-pay | Admitting: Endocrinology

## 2019-04-01 DIAGNOSIS — E1065 Type 1 diabetes mellitus with hyperglycemia: Secondary | ICD-10-CM

## 2019-04-01 MED ORDER — NOVOLIN N FLEXPEN 100 UNIT/ML ~~LOC~~ SUPN: 150.0000 [IU] | PEN_INJECTOR | SUBCUTANEOUS | 11 refills | Status: DC

## 2019-04-01 NOTE — Telephone Encounter (Signed)
Insulin NPH, Human,, Isophane, (NOVOLIN N FLEXPEN) 100 UNIT/ML Kiwkpen 20 pen 11 04/01/2019    Sig - Route: Inject 150 Units into the skin every morning. And pen needles 1/day - Subcutaneous   Sent to pharmacy as: Insulin NPH, Human,, Isophane, (NOVOLIN N FLEXPEN) 100 UNIT/ML Kiwkpen   E-Prescribing Status: Receipt confirmed by pharmacy (04/01/2019 2:53 PM EDT)

## 2019-04-01 NOTE — Telephone Encounter (Signed)
Insulin NPH, Human,, Isophane, (NOVOLIN N FLEXPEN) 100 UNIT/ML Kiwkpen  Patient called and advised that he checked with [facility] and that they did not receive the prescription. Patient requested that we resend the prescription.

## 2019-04-08 ENCOUNTER — Encounter: Payer: Self-pay | Admitting: Cardiology

## 2019-04-08 ENCOUNTER — Other Ambulatory Visit: Payer: Self-pay

## 2019-04-08 ENCOUNTER — Ambulatory Visit (INDEPENDENT_AMBULATORY_CARE_PROVIDER_SITE_OTHER): Payer: Medicaid Other | Admitting: Cardiology

## 2019-04-08 VITALS — BP 151/93 | HR 94 | Wt 256.0 lb

## 2019-04-08 DIAGNOSIS — I10 Essential (primary) hypertension: Secondary | ICD-10-CM

## 2019-04-08 DIAGNOSIS — J9611 Chronic respiratory failure with hypoxia: Secondary | ICD-10-CM

## 2019-04-08 DIAGNOSIS — E1165 Type 2 diabetes mellitus with hyperglycemia: Secondary | ICD-10-CM | POA: Diagnosis not present

## 2019-04-08 DIAGNOSIS — R000 Tachycardia, unspecified: Secondary | ICD-10-CM | POA: Diagnosis not present

## 2019-04-08 DIAGNOSIS — F17201 Nicotine dependence, unspecified, in remission: Secondary | ICD-10-CM

## 2019-04-08 NOTE — Progress Notes (Signed)
Cardiology Office Note  Date: 04/08/2019   ID: [name], DOB 05-May-1976, MRN [id number]  PCP:  Sinda Du, MD  Consulting Cardiologist: Satira Sark, MD Electrophysiologist:  None   Chief Complaint  Patient presents with  . Fast heartbeat    History of Present Illness: [name] is a 43 y.o. male referred for cardiology consultation by Dr. Luan Pulling for evaluation of tachycardia.  I reviewed his records and updated the chart.  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  He states that he uses oxygen at home, did not wear it to the office today.  He has noticed that his heart rate tends to run fast when he has blood pressure checks, does not necessarily feel palpitations, but has dyspnea on exertion and NYHA class II-III.  He has had no sudden onset dizziness or syncope.  No prior documentation of cardiac arrhythmia.  I personally reviewed his tracing from February of this year which showed sinus tachycardia at 116 bpm.  I reviewed his most recent lab work, TSH was normal in August.  Hemoglobin A1c was 11% in October.  Venous Doppler on the right was negative for DVT in September.  Chest x-ray from August showed stable fibrotic changes of the lungs.  He was placed on Lopressor 12.5 twice daily by Dr. Luan Pulling this past Wednesday.  Heart rate today was in the high 90s, had been up in the 120s based on prior health care visits.  Of note, he was hypoxic after walking into the office for check-in, O2 sats 86% and these improved to 94% with rest.  He is currently trying to quit smoking.  He has been using nicotine patches and cutting down.  Did not tolerate Chantix in the past.  Past Medical History:  Diagnosis Date  . Bipolar disorder ([place])   . Erectile dysfunction   . Essential hypertension   . GERD (gastroesophageal reflux  disease)   . History of pneumonia   . Hyperlipidemia   . Interstitial lung disease ([facility])    Pulmonary eosinophilia  . Type 2 diabetes mellitus ([place])     Past Surgical History:  Procedure Laterality Date  . APPENDECTOMY    . CHOLECYSTECTOMY N/A 07/11/2013   Procedure: LAPAROSCOPIC CHOLECYSTECTOMY;  Surgeon: Jamesetta So, MD;  Location: [facility];  Service: General;  Laterality: N/A;  . INCISION AND DRAINAGE ABSCESS / HEMATOMA OF BURSA / KNEE / THIGH    . KNEE SURGERY Bilateral   . VIDEO BRONCHOSCOPY Bilateral 02/09/2018   Procedure: VIDEO BRONCHOSCOPY WITHOUT FLUORO;  Surgeon: Brand Males, MD;  [facility];  Service: Cardiopulmonary;  Laterality: Bilateral;    Current Outpatient Medications  Medication Sig Dispense Refill  . albuterol (VENTOLIN HFA) 108 (90 Base) MCG/ACT inhaler Inhale 2 puffs into the lungs every 6 (six) hours as needed.    Marland Kitchen amoxicillin (AMOXIL) 500 MG capsule Take 500 mg by mouth at bedtime.    . divalproex (DEPAKOTE) 500 MG DR tablet Take 1,000 mg by mouth at bedtime.    Marland Kitchen glucose blood (ONETOUCH VERIO) test strip 1 each by Other route 2 (two) times daily. And lancets 2/day 100 each 12  . Insulin NPH, Human,, Isophane, (NOVOLIN N FLEXPEN) 100 UNIT/ML Kiwkpen Inject 150 Units into the skin every morning. And pen needles 1/day 20 pen 11  . lisinopril (PRINIVIL,ZESTRIL) 20 MG tablet Take 20 mg by  mouth at bedtime.    . metoprolol tartrate (LOPRESSOR) 25 MG tablet Take 12.5 mg by mouth 2 (two) times daily.    . nicotine (NICODERM CQ - DOSED IN MG/24 HOURS) 21 mg/24hr patch Place 21 mg onto the skin daily.    Marland Kitchen omeprazole (PRILOSEC) 20 MG capsule Take 20 mg by mouth at bedtime.    . predniSONE (DELTASONE) 20 MG tablet Take 3 tablets (60 mg total) by mouth daily with breakfast. (Patient taking differently: Take 20 mg by mouth daily with breakfast. ) 90 tablet 0  . risperidone (RISPERDAL) 4 MG tablet Take 4 mg by mouth at bedtime.     . sildenafil (VIAGRA)  100 MG tablet Take 0.5-1 tablets (50-100 mg total) daily as needed by mouth for erectile dysfunction. 30 tablet 11  . simvastatin (ZOCOR) 20 MG tablet Take 20 mg by mouth at bedtime.    . solifenacin (VESICARE) 5 MG tablet Take 5 mg by mouth at bedtime.     . SYMBICORT 160-4.5 MCG/ACT inhaler Inhale 2 puffs into the lungs 2 (two) times daily.     No current facility-administered medications for this visit.    Allergies:  Patient has no known allergies.   Social History: The patient  reports that he has been smoking cigarettes. He has a 5.00 pack-year smoking history. He has never used smokeless tobacco. He reports that he does not drink alcohol or use drugs.   Family History: The patient's family history includes Diabetes in his maternal grandmother and mother.   ROS:  Please see the history of present illness. Otherwise, complete review of systems is positive for knee pains.  All other systems are reviewed and negative.   Physical Exam: VS:  BP (!) 151/93 (BP Location: Right Arm, Cuff Size: Large)   Pulse 94   Wt 256 lb (116.1 kg)   SpO2 (!) 86% Comment: on room air  BMI 33.78 kg/m , BMI Body mass index is 33.78 kg/m.  Wt Readings from Last 3 Encounters:  04/08/19 256 lb (116.1 kg)  03/16/19 257 lb 3.2 oz (116.7 kg)  01/26/19 244 lb 9.6 oz (110.9 kg)    General: Obese male, no distress. HEENT: Conjunctiva and lids normal, wearing a mask. Neck: Supple, no elevated JVP or carotid bruits, no thyromegaly. Lungs: Coarse breath sounds throughout with scattered rhonchi and pops. Cardiac: Regular rate and rhythm, no S3 or significant systolic murmur, no pericardial rub. Abdomen: Soft, nontender, bowel sounds present. Extremities: Trace ankle edema, distal pulses 2+. Skin: Warm and dry. Musculoskeletal: No kyphosis. Neuropsychiatric: Alert and oriented x3, affect grossly appropriate.  ECG:  No old tracings for review.  Recent Labwork: 01/26/2019: BUN 10; Creatinine, Ser 0.74;  Potassium 3.6; Sodium 136; TSH 1.36   Other Studies Reviewed Today:  Echocardiogram 03/03/2019:  1. Left ventricular ejection fraction, by visual estimation, is 50 to 55%. The left ventricle has normal function. There is moderately increased left ventricular hypertrophy.  2. Indeterminate diastolic filling due to E-A fusion pattern of LV diastolic filling.  3. Global right ventricle has normal systolic function.The right ventricular size is normal. No increase in right ventricular wall thickness.  4. Left atrial size was normal.  5. Right atrial size was normal.  6. The mitral valve is normal in structure. No evidence of mitral valve regurgitation. No evidence of mitral stenosis.  7. The tricuspid valve is normal in structure. Tricuspid valve regurgitation is trivial.  8. The aortic valve is tricuspid Aortic valve regurgitation was not visualized  by color flow Doppler. Structurally normal aortic valve, with no evidence of sclerosis or stenosis.  9. The pulmonic valve was not well visualized. Pulmonic valve regurgitation is not visualized by color flow Doppler. 10. Mildly elevated pulmonary artery systolic pressure. 11. The inferior vena cava is normal in size with greater than 50% respiratory variability, suggesting right atrial pressure of 3 mmHg.  Chest x-ray 01/04/2019: FINDINGS: There is fibrotic change throughout the lungs bilaterally, essentially stable. There is mild atelectasis in the left base. There is no frank edema or consolidation. Heart size and pulmonary vascularity are normal. No adenopathy. No bone lesions.  IMPRESSION: Fibrotic changes throughout both lungs, essentially stable. Mild left basilar atelectasis. Stable cardiac silhouette. No evident adenopathy.  Right lower extremity venous Dopplers 02/02/2019: IMPRESSION: No evidence of deep venous thrombosis.  Assessment and Plan:  1.  Sinus tachycardia, most likely secondary to comorbid illnesses including interstitial  lung disease due to pulmonary eosinophilia with chronic hypoxic respiratory failure and also poorly controlled type 2 diabetes mellitus with probable component of autonomic dysfunction.  Recent echocardiogram shows normal LV and RV function, only mildly increased pulmonary artery systolic pressure.  He was placed on low-dose press Lopressor by Dr. Luan Pulling and heart rate is in the 90s today.  I doubt arrhythmia however we will place a 72-hour Zio patch to better understand true heart rate availability and exclude any paroxysmal arrhythmias at least in the short-term.  Doubt that further cardiac work-up is necessary at this time.  2.  Essential hypertension.  He is currently on Zestril.  Also undergoing work-up for nephrotic syndrome per nephrology.  3.  Mixed hyperlipidemia, on Zocor.  4.  Poorly controlled type 2 diabetes mellitus, recent hemoglobin A1c 11%.  He is following with endocrinology and has undergone medication adjustments.  5.  Tobacco abuse.  I encouraged him to continue toward smoking cessation.  Medication Adjustments/Labs and Tests Ordered: Current medicines are reviewed at length with the patient today.  Concerns regarding medicines are outlined above.   Tests Ordered: Orders Placed This Encounter  Procedures  . Cardiac event monitor    Medication Changes: No orders of the defined types were placed in this encounter.   Disposition:  Follow up test results.  Signed, Satira Sark, MD, [facility] 04/08/2019 2:04 PM    [place] [facility] [address] [place], [place] [address] Phone: [phone]; Fax: [phone]

## 2019-04-08 NOTE — Patient Instructions (Addendum)
Medication Instructions:  Your physician recommends that you continue on your current medications as directed. Please refer to the Current Medication list given to you today.   Labwork: none  Testing/Procedures: Your physician has recommended that you wear a holter monitor. Holter monitors are medical devices that record the heart's electrical activity. Doctors most often use these monitors to diagnose arrhythmias. Arrhythmias are problems with the speed or rhythm of the heartbeat. The monitor is a small, portable device. You can wear one while you do your normal daily activities. This is usually used to diagnose what is causing palpitations/syncope (passing out).    Follow-Up: Your physician recommends that you schedule a follow-up appointment in: to be determined, we will call after you wear monitor    Any Other Special Instructions Will Be Listed Below (If Applicable).  Please come on Monday @ 4:00 pm  to have monitor placed.   If you need a refill on your cardiac medications before your next appointment, please call your pharmacy.

## 2019-04-11 ENCOUNTER — Ambulatory Visit (INDEPENDENT_AMBULATORY_CARE_PROVIDER_SITE_OTHER): Payer: Medicaid Other

## 2019-04-11 DIAGNOSIS — R000 Tachycardia, unspecified: Secondary | ICD-10-CM

## 2019-04-19 ENCOUNTER — Ambulatory Visit: Payer: Medicaid Other | Admitting: Endocrinology

## 2019-05-04 ENCOUNTER — Other Ambulatory Visit: Payer: Self-pay | Admitting: Nephrology

## 2019-05-04 ENCOUNTER — Other Ambulatory Visit (HOSPITAL_COMMUNITY): Payer: Self-pay | Admitting: Nephrology

## 2019-05-04 DIAGNOSIS — R801 Persistent proteinuria, unspecified: Secondary | ICD-10-CM

## 2019-05-05 ENCOUNTER — Other Ambulatory Visit: Payer: Self-pay

## 2019-05-05 ENCOUNTER — Ambulatory Visit (HOSPITAL_COMMUNITY)
Admission: RE | Admit: 2019-05-05 | Discharge: 2019-05-05 | Disposition: A | Discharge status: Home / Self Care | Payer: Medicaid Other | Source: Ambulatory Visit | Attending: Nephrology | Admitting: Nephrology

## 2019-05-05 DIAGNOSIS — R801 Persistent proteinuria, unspecified: Secondary | ICD-10-CM | POA: Diagnosis present

## 2019-06-09 ENCOUNTER — Encounter (INDEPENDENT_AMBULATORY_CARE_PROVIDER_SITE_OTHER): Payer: Self-pay | Admitting: Gastroenterology

## 2019-07-11 ENCOUNTER — Other Ambulatory Visit: Payer: Self-pay | Admitting: Family Medicine

## 2019-07-18 ENCOUNTER — Encounter (INDEPENDENT_AMBULATORY_CARE_PROVIDER_SITE_OTHER): Payer: Self-pay | Admitting: Gastroenterology

## 2019-07-18 ENCOUNTER — Other Ambulatory Visit: Payer: Self-pay

## 2019-07-18 ENCOUNTER — Ambulatory Visit (INDEPENDENT_AMBULATORY_CARE_PROVIDER_SITE_OTHER): Payer: Medicaid Other | Admitting: Gastroenterology

## 2019-07-18 VITALS — BP 152/99 | HR 114 | Temp 97.3°F | Ht 73.0 in | Wt 240.1 lb

## 2019-07-18 DIAGNOSIS — K760 Fatty (change of) liver, not elsewhere classified: Secondary | ICD-10-CM | POA: Diagnosis not present

## 2019-07-18 NOTE — Patient Instructions (Addendum)
Make sure you are taking aspirin with food instead of on emptying stomach   We are checking additional labs and ultrasound for evaluation of liver.  We will contact you with recommendations.

## 2019-07-18 NOTE — Progress Notes (Addendum)
Patient profile: [name] is a 44 y.o. male seen for evaluation of hepatic steatosis. Referred by Dr. Theador Hawthorne. Complex PMHX of CKD, DM, lung disease on chronic O2 therapy. Also recently referred to rheumatology for evaluation of possible sarcoid.   History of Present Illness: [name] is seen today for for evaluation of hepatic steatosis. Only concern symptom wise today is  headaches and feeling lightheaded, this is been a chronic complaint unchanged recently.  He initially established with nephrology for swelling in his ankles and feet and proteinurea.  His swelling has significantly improved, no longer needing lasix.  He reports GERD symptoms well controlled on Prilosec 20 mg once a day.  He does have some belching but he drinks 6 sodas a day.  He denies any nausea vomiting or abdominal pain.  Usually moves his stool daily or every other day.  Has rare chronic intermittent issues with small amount of blood in stool.  He has no abdominal pain.  Reports the rectal bleeding has been going on for years.  He denies any changes with bowel habits recently.  Appetite good.  Has lost some weight as below, he is switched over to using an air Rolly Salter and eating less grease.  He is currently using a nicotine patch and is down to 5 cigarettes a day.    Wt Readings from Last 3 Encounters:  07/18/19 240 lb 1.6 oz (108.9 kg)  04/08/19 256 lb (116.1 kg)  03/16/19 257 lb 3.2 oz (116.7 kg)     Last Colonoscopy: None prior Last Endoscopy: None prior   Past Medical History:  Past Medical History:  Diagnosis Date  . Bipolar disorder ([place])   . Erectile dysfunction   . Essential hypertension   . GERD (gastroesophageal reflux disease)   . History of pneumonia   . Hyperlipidemia   . Interstitial lung disease (HCC)    Pulmonary eosinophilia  . Type 2 diabetes mellitus ([place])     Problem List: Patient Active Problem List   Diagnosis Date Noted  . 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  Chronic respiratory failure with hypoxia ([place]) 02/16/2018  . ILD (interstitial lung disease) ([place])   . Hypogonadism male 09/12/2014  . Uncontrolled type 1 diabetes mellitus ([place]) 02/18/2014  . Cholelithiasis 06/06/2013  . Bipolar 1 disorder, mixed, moderate ([place]) 06/04/2013  . Abnormal LFTs 06/03/2013  . Pancreatitis, acute 06/03/2013  . DKA (diabetic ketoacidosis) ([place]) 06/03/2013  . DKA (diabetic ketoacidoses) ([place]) 06/03/2013    Past Surgical History: Past Surgical History:  Procedure Laterality Date  . APPENDECTOMY    . CHOLECYSTECTOMY N/A 07/11/2013   Procedure: LAPAROSCOPIC CHOLECYSTECTOMY;  Surgeon: Jamesetta So, MD;  Location: [facility];  Service: General;  Laterality: N/A;  . INCISION AND DRAINAGE ABSCESS / HEMATOMA OF BURSA / KNEE / THIGH    . KNEE SURGERY Bilateral   . VIDEO BRONCHOSCOPY Bilateral 02/09/2018   Procedure: VIDEO BRONCHOSCOPY WITHOUT FLUORO;  Surgeon: Brand Males, MD;  Location: [facility];  Service: Cardiopulmonary;  Laterality: Bilateral;    Allergies: No Known Allergies    Home Medications:  Current Outpatient Medications:  .  amoxicillin (AMOXIL) 500 MG capsule, Take 500 mg by mouth at bedtime., Disp: , Rfl:  .  divalproex (DEPAKOTE) 500 MG DR tablet, Take 1,000 mg by mouth at bedtime., Disp: , Rfl:  .  furosemide (LASIX) 40 MG tablet, Take 40 mg by mouth as needed. , Disp: , Rfl:  .  glucose blood (ONETOUCH VERIO) test strip, 1 each  by Other route 2 (two) times daily. And lancets 2/day, Disp: 100 each, Rfl: 12 .  Insulin NPH, Human,, Isophane, (NOVOLIN N FLEXPEN) 100 UNIT/ML Kiwkpen, Inject 150 Units into the skin every morning. And pen needles 1/day, Disp: 20 pen, Rfl: 11 .  lisinopril (ZESTRIL) 20 MG tablet, TAKE ONE TABLET BY MOUTH ONCE DAILY., Disp: 30 tablet, Rfl: 0 .  nicotine (NICODERM CQ - DOSED IN MG/24 HOURS) 21 mg/24hr patch, Place 21 mg onto the skin daily., Disp: , Rfl:  .  omeprazole (PRILOSEC) 20 MG capsule, Take 20 mg by mouth at  bedtime., Disp: , Rfl:  .  predniSONE (DELTASONE) 20 MG tablet, Take 3 tablets (60 mg total) by mouth daily with breakfast. (Patient taking differently: Take 20 mg by mouth daily with breakfast. ), Disp: 90 tablet, Rfl: 0 .  risperidone (RISPERDAL) 4 MG tablet, Take 4 mg by mouth at bedtime. , Disp: , Rfl:  .  sildenafil (VIAGRA) 100 MG tablet, Take 0.5-1 tablets (50-100 mg total) daily as needed by mouth for erectile dysfunction., Disp: 30 tablet, Rfl: 11 .  simvastatin (ZOCOR) 20 MG tablet, Take 20 mg by mouth at bedtime., Disp: , Rfl:  .  solifenacin (VESICARE) 5 MG tablet, Take 5 mg by mouth at bedtime. , Disp: , Rfl:  .  SYMBICORT 160-4.5 MCG/ACT inhaler, Inhale 2 puffs into the lungs 2 (two) times daily., Disp: , Rfl:  .  albuterol (VENTOLIN HFA) 108 (90 Base) MCG/ACT inhaler, Inhale 2 puffs into the lungs every 6 (six) hours as needed., Disp: , Rfl:  .  metoprolol tartrate (LOPRESSOR) 25 MG tablet, Take 12.5 mg by mouth 2 (two) times daily., Disp: , Rfl:    Family History: family history includes Diabetes in his maternal grandmother and mother.    Social History:   reports that he has been smoking cigarettes. He has a 5.00 pack-year smoking history. He has never used smokeless tobacco. He reports that he does not drink alcohol or use drugs.   Review of Systems: Constitutional: Denies weight loss/weight gain  Eyes: No changes in vision. ENT: No oral lesions, sore throat.  GI: see HPI.  Heme/Lymph: No easy bruising.  CV: No chest pain.  GU: No hematuria.  Integumentary: No rashes.  Neuro: No headaches.  Psych: No depression/anxiety.  Endocrine: No heat/cold intolerance.  Allergic/Immunologic: No urticaria.  Resp: No cough, SOB.  Musculoskeletal: No joint swelling.    Physical Examination: BP (!) 152/99 (BP Location: Right Arm, Patient Position: Sitting, Cuff Size: Large)   Pulse (!) 114   Temp (!) 97.3 F (36.3 C) (Temporal)   Ht 6\' 1"  (1.854 m)   Wt 240 lb 1.6 oz (108.9  kg)   BMI 31.68 kg/m  Gen: NAD, alert and oriented x 4 HEENT: PEERLA, EOMI, Neck: supple, no JVD Chest: wheezing, rhonchi throughout bilateral upper and lower bases CV: RRR, no m/g/c/r Abd: soft, NT, ND, +BS in all four quadrants; no HSM, guarding, ridigity, or rebound tenderness Ext: no edema, well perfused with 2+ pulses, Skin: no rash or lesions noted on observed skin Lymph: no noted LAD  Data:   [place] 2015--IMPRESSION: 1. Cholelithiasis without sonographic evidence of acute cholecystitis. 2.  Hepatic steatosis.  Labs in referral-04/05/19-albumin 3.2, glucose 302, Na 135, K+ 3.7, ANCA negative, ANA negative. Vit D 23, Hep B negative sAb, sAg, negative HCV Ab.   Per referral notes--Abd [place] 2020--"showed pt has echogenic liver most likely w/ hepatic steatosis"  Assessment/Plan: Mr. Baptist is a 44 y.o.  male  Yuvraj was seen today for new patient (initial visit).  Diagnoses and all orders for this visit:  Hepatic steatosis -     Hepatic function panel -     US ABDOMEN RUQ W/ELASTOGRAPHY; Future -     Mitochondrial antibodies -     Anti-Smooth Muscle Antibody, IGG -     Fe+TIBC+Fer -     CBC with Differential -     INR/PT -     Basic Metabolic Panel (BMET)     1.  Hepatic steatosis-limited records available for review.  We will request full ultrasound report as above.  Unable to find any liver enzymes.  Most likely Karlene Lineman given his poorly controlled diabetes but will rule out other causes as above.  We will also order FibroScan to clarify degree of long-term fibrosis.  He denies any alcohol use currently or in past.  No family history of liver disease or high risk behaviors.  He is on chronic amoxicillin once a day to prevent boils.  2.  GERD-well-controlled on low-dose PPI.  Diet modifications reviewed.  3.  Rectal bleeding-reports this to be chronic long term, describes small amount of bleeding intermittent, suspect anal outlet.  We will check a CBC with above labs. He is  to notify me if this changes and would recommend colonoscopy. Denies family history of colon polyps/colon cancer.  Further recommendations pending lab and fibroscan results   I personally performed the service, non-incident to. (WP)  Laurine Blazer, [facility]

## 2019-07-21 LAB — HEPATIC FUNCTION PANEL
AG Ratio: 0.9 (calc) — ABNORMAL LOW (ref 1.0–2.5)
ALT: 15 U/L (ref 9–46)
AST: 11 U/L (ref 10–40)
Albumin: 3.5 g/dL — ABNORMAL LOW (ref 3.6–5.1)
Alkaline phosphatase (APISO): 96 U/L (ref 36–130)
Bilirubin, Direct: 0.1 mg/dL (ref 0.0–0.2)
Globulin: 3.9 g/dL (calc) — ABNORMAL HIGH (ref 1.9–3.7)
Indirect Bilirubin: 0.2 mg/dL (calc) (ref 0.2–1.2)
Total Bilirubin: 0.3 mg/dL (ref 0.2–1.2)
Total Protein: 7.4 g/dL (ref 6.1–8.1)

## 2019-07-21 LAB — BASIC METABOLIC PANEL
BUN: 8 mg/dL (ref 7–25)
CO2: 29 mmol/L (ref 20–32)
Calcium: 9.4 mg/dL (ref 8.6–10.3)
Chloride: 96 mmol/L — ABNORMAL LOW (ref 98–110)
Creat: 0.67 mg/dL (ref 0.60–1.35)
Glucose, Bld: 277 mg/dL — ABNORMAL HIGH (ref 65–99)
Potassium: 3.7 mmol/L (ref 3.5–5.3)
Sodium: 137 mmol/L (ref 135–146)

## 2019-07-21 LAB — CBC WITH DIFFERENTIAL/PLATELET
Absolute Monocytes: 867 cells/uL (ref 200–950)
Basophils Absolute: 51 cells/uL (ref 0–200)
Basophils Relative: 0.5 %
Eosinophils Absolute: 92 cells/uL (ref 15–500)
Eosinophils Relative: 0.9 %
HCT: 47.4 % (ref 38.5–50.0)
Hemoglobin: 15.7 g/dL (ref 13.2–17.1)
Lymphs Abs: 2887 cells/uL (ref 850–3900)
MCH: 29.3 pg (ref 27.0–33.0)
MCHC: 33.1 g/dL (ref 32.0–36.0)
MCV: 88.6 fL (ref 80.0–100.0)
MPV: 10.6 fL (ref 7.5–12.5)
Monocytes Relative: 8.5 %
Neutro Abs: 6304 cells/uL (ref 1500–7800)
Neutrophils Relative %: 61.8 %
Platelets: 250 10*3/uL (ref 140–400)
RBC: 5.35 10*6/uL (ref 4.20–5.80)
RDW: 15.3 % — ABNORMAL HIGH (ref 11.0–15.0)
Total Lymphocyte: 28.3 %
WBC: 10.2 10*3/uL (ref 3.8–10.8)

## 2019-07-21 LAB — IRON,TIBC AND FERRITIN PANEL
%SAT: 15 % (calc) — ABNORMAL LOW (ref 20–48)
Ferritin: 160 ng/mL (ref 38–380)
Iron: 52 ug/dL (ref 50–180)
TIBC: 345 mcg/dL (calc) (ref 250–425)

## 2019-07-21 LAB — PROTIME-INR
INR: 0.9
Prothrombin Time: 9.9 s (ref 9.0–11.5)

## 2019-07-21 LAB — MITOCHONDRIAL ANTIBODIES: Mitochondrial M2 Ab, IgG: <=20 U

## 2019-07-21 LAB — ANTI-SMOOTH MUSCLE ANTIBODY, IGG: Actin (Smooth Muscle) Antibody (IGG): <20 U (ref <20)

## 2019-07-27 ENCOUNTER — Other Ambulatory Visit: Payer: Self-pay

## 2019-07-27 ENCOUNTER — Ambulatory Visit (HOSPITAL_COMMUNITY)
Admission: RE | Admit: 2019-07-27 | Discharge: 2019-07-27 | Disposition: A | Discharge status: Home / Self Care | Payer: Medicaid Other | Source: Ambulatory Visit | Attending: Gastroenterology | Admitting: Gastroenterology

## 2019-07-27 DIAGNOSIS — K760 Fatty (change of) liver, not elsewhere classified: Secondary | ICD-10-CM | POA: Diagnosis not present

## 2019-08-02 ENCOUNTER — Telehealth: Payer: Self-pay | Admitting: Endocrinology

## 2019-08-02 NOTE — Telephone Encounter (Signed)
please contact patient: F/u is due 

## 2019-08-03 NOTE — Telephone Encounter (Signed)
LMTCB to schedule f/u

## 2019-08-03 NOTE — Telephone Encounter (Signed)
LVM requesting returned call. Please continue efforts to reach pt and if unsuccessful please mail letter

## 2019-08-05 ENCOUNTER — Other Ambulatory Visit: Payer: Self-pay

## 2019-08-09 ENCOUNTER — Ambulatory Visit (INDEPENDENT_AMBULATORY_CARE_PROVIDER_SITE_OTHER): Payer: Medicaid Other | Admitting: Endocrinology

## 2019-08-09 ENCOUNTER — Other Ambulatory Visit: Payer: Self-pay

## 2019-08-09 ENCOUNTER — Encounter: Payer: Self-pay | Admitting: Endocrinology

## 2019-08-09 VITALS — BP 126/78 | HR 102 | Ht 73.0 in | Wt 238.0 lb

## 2019-08-09 DIAGNOSIS — E1065 Type 1 diabetes mellitus with hyperglycemia: Secondary | ICD-10-CM | POA: Diagnosis not present

## 2019-08-09 LAB — POCT GLYCOSYLATED HEMOGLOBIN (HGB A1C): Hemoglobin A1C: 12.7 % — AB (ref 4.0–5.6)

## 2019-08-09 MED ORDER — NOVOLIN N FLEXPEN 100 UNIT/ML ~~LOC~~ SUPN: 200.0000 [IU] | PEN_INJECTOR | SUBCUTANEOUS | 11 refills | Status: DC

## 2019-08-09 NOTE — Progress Notes (Signed)
Subjective:    Patient ID: [name], male    DOB: [birth year], 44 y.o.   MRN: [id number]  HPI Pt returns for f/u of diabetes mellitus: DM type: 1 Dx'ed: 123XX123 Complications: foot ulcer.  Therapy: insulin since dx.   DKA: he has had 3 episodes since dx--each with going off his insulin.   Severe hypoglycemia: never.   Pancreatitis: he had gallstone pancreatitis in late 2014.  SDOH: he takes QD insulin, due to noncompliance with more frequent dosing.  He self-administers insulin.    Other: He takes prednisone for pulm fibrosis; he changed Lantus to NPH, due to pattern of cbg's.   Interval history: No recent change in prednisone dosage.  no cbg record, but he says cbg's vary from 200-350.  It is in general higher as the day goes on.  Pt says he never misses the insulin.   Past Medical History:  Diagnosis Date  . Bipolar disorder ([place])   . Erectile dysfunction   . Essential hypertension   . GERD (gastroesophageal reflux disease)   . History of pneumonia   . Hyperlipidemia   . Interstitial lung disease (HCC)    Pulmonary eosinophilia  . Type 2 diabetes mellitus ([place])     Past Surgical History:  Procedure Laterality Date  . APPENDECTOMY    . CHOLECYSTECTOMY N/A 07/11/2013   Procedure: LAPAROSCOPIC CHOLECYSTECTOMY;  Surgeon: Jamesetta So, MD;  Location: [facility];  Service: General;  Laterality: N/A;  . INCISION AND DRAINAGE ABSCESS / HEMATOMA OF BURSA / KNEE / THIGH    . KNEE SURGERY Bilateral   . VIDEO BRONCHOSCOPY Bilateral 02/09/2018   Procedure: VIDEO BRONCHOSCOPY WITHOUT FLUORO;  Surgeon: Brand Males, MD;  [facility];  Service: Cardiopulmonary;  Laterality: Bilateral;    Social History   Socioeconomic History  . Marital status: Single    Spouse name: Not on file  . Number of children: 0  . Years of education: Not on file  . Highest education level: Not on file  Occupational History  . Not on file  Tobacco Use  . Smoking status: Current Some  Day Smoker    Packs/day: 0.50    Years: 10.00    Pack years: 5.00    Types: Cigarettes  . Smokeless tobacco: Never Used  Substance and Sexual Activity  . Alcohol use: No  . Drug use: No  . Sexual activity: Never  Other Topics Concern  . Not on file  Social History Narrative  . Not on file   Social Determinants of Health   Financial Resource Strain:   . Difficulty of Paying Living Expenses:   Food Insecurity:   . Worried About Charity fundraiser in the Last Year:   . Arboriculturist in the Last Year:   Transportation Needs:   . Film/video editor (Medical):   Marland Kitchen Lack of Transportation (Non-Medical):   Physical Activity:   . Days of Exercise per Week:   . Minutes of Exercise per Session:   Stress:   . Feeling of Stress :   Social Connections:   . Frequency of Communication with Friends and Family:   . Frequency of Social Gatherings with Friends and Family:   . Attends Religious Services:   . Active Member of Clubs or Organizations:   . Attends Archivist Meetings:   Marland Kitchen Marital Status:   Intimate Partner Violence:   . Fear of Current or Ex-Partner:   . Emotionally Abused:   .  Physically Abused:   . Sexually Abused:     Current Outpatient Medications on File Prior to Visit  Medication Sig Dispense Refill  . albuterol (VENTOLIN HFA) 108 (90 Base) MCG/ACT inhaler Inhale 2 puffs into the lungs every 6 (six) hours as needed.    Marland Kitchen amoxicillin (AMOXIL) 500 MG capsule Take 500 mg by mouth at bedtime.    . divalproex (DEPAKOTE) 500 MG DR tablet Take 1,000 mg by mouth at bedtime.    . furosemide (LASIX) 40 MG tablet Take 40 mg by mouth as needed.     Marland Kitchen glucose blood (ONETOUCH VERIO) test strip 1 each by Other route 2 (two) times daily. And lancets 2/day 100 each 12  . lisinopril (ZESTRIL) 20 MG tablet TAKE ONE TABLET BY MOUTH ONCE DAILY. 30 tablet 0  . metoprolol tartrate (LOPRESSOR) 25 MG tablet Take 12.5 mg by mouth 2 (two) times daily.    . nicotine (NICODERM  CQ - DOSED IN MG/24 HOURS) 21 mg/24hr patch Place 21 mg onto the skin daily.    Marland Kitchen omeprazole (PRILOSEC) 20 MG capsule Take 20 mg by mouth at bedtime.    . predniSONE (DELTASONE) 20 MG tablet Take 3 tablets (60 mg total) by mouth daily with breakfast. (Patient taking differently: Take 20 mg by mouth daily with breakfast. ) 90 tablet 0  . risperidone (RISPERDAL) 4 MG tablet Take 4 mg by mouth at bedtime.     . sildenafil (VIAGRA) 100 MG tablet Take 0.5-1 tablets (50-100 mg total) daily as needed by mouth for erectile dysfunction. 30 tablet 11  . simvastatin (ZOCOR) 20 MG tablet Take 20 mg by mouth at bedtime.    . solifenacin (VESICARE) 5 MG tablet Take 5 mg by mouth at bedtime.     . SYMBICORT 160-4.5 MCG/ACT inhaler Inhale 2 puffs into the lungs 2 (two) times daily.     No current facility-administered medications on file prior to visit.    No Known Allergies  Family History  Problem Relation Age of Onset  . Diabetes Mother   . Diabetes Maternal Grandmother   . Pancreatitis Neg Hx   . Colon cancer Neg Hx     BP 126/78 (BP Location: Left Arm, Patient Position: Sitting, Cuff Size: Large)   Pulse (!) 102   Ht 6\' 1"  (1.854 m)   Wt 238 lb (108 kg)   SpO2 97%   BMI 31.40 kg/m    Review of Systems He denies hypoglycemia.      Objective:   Physical Exam VITAL SIGNS:  See vs page GENERAL: no distress Pulses: dorsalis pedis intact bilat.   MSK: no deformity of the feet CV: no leg edema Skin:  no ulcer on the feet.  normal color and temp on the feet. Neuro: sensation is intact to touch on the feet, but decreased from normal.    Lab Results  Component Value Date   CREATININE 0.67 07/18/2019   BUN 8 07/18/2019   NA 137 07/18/2019   K 3.7 07/18/2019   CL 96 (L) 07/18/2019   CO2 29 07/18/2019   A1c=12.7%     Assessment & Plan:  Type 1 DM, with foot ulcer: worse.  Noncompliance: in this setting, I hesitate to add another med.   Patient Instructions  check your blood sugar  twice a day.  vary the time of day when you check, between before the 3 meals, and at bedtime.  also check if you have symptoms of your blood sugar being too high  or too low.  please keep a record of the readings and bring it to your next appointment here (or you can bring the meter itself).  You can write it on any piece of paper.  please call us sooner if your blood sugar goes below 70, or if you have a lot of readings over 200. Please increase the NPH insulin to 200 units each morning.   On this type of insulin schedule, you should eat meals on a regular schedule.  If a meal is missed or significantly delayed, your blood sugar could go low.   Please come back for a follow-up appointment in 1 month.

## 2019-08-09 NOTE — Patient Instructions (Addendum)
check your blood sugar twice a day.  vary the time of day when you check, between before the 3 meals, and at bedtime.  also check if you have symptoms of your blood sugar being too high or too low.  please keep a record of the readings and bring it to your next appointment here (or you can bring the meter itself).  You can write it on any piece of paper.  please call us sooner if your blood sugar goes below 70, or if you have a lot of readings over 200. Please increase the NPH insulin to 200 units each morning.   On this type of insulin schedule, you should eat meals on a regular schedule.  If a meal is missed or significantly delayed, your blood sugar could go low.   Please come back for a follow-up appointment in 1 month.

## 2019-08-13 ENCOUNTER — Ambulatory Visit: Payer: Medicaid Other | Attending: Internal Medicine

## 2019-08-13 DIAGNOSIS — Z23 Encounter for immunization: Secondary | ICD-10-CM

## 2019-08-13 NOTE — Progress Notes (Signed)
   Covid-19 Vaccination Clinic  Name:  [name]    MRN: [id number] DOB: 21-Jan-1976  08/13/2019  [name] was observed post Covid-19 immunization for 15 minutes without incident. He was provided with Vaccine Information Sheet and instruction to access the V-Safe system.   [name] was instructed to call 911 with any severe reactions post vaccine: Marland Kitchen Difficulty breathing  . Swelling of face and throat  . A fast heartbeat  . A bad rash all over body  . Dizziness and weakness   Immunizations Administered    Name Date Dose VIS Date Route   Moderna COVID-19 Vaccine 08/13/2019  1:22 PM 0.5 mL 05/03/2019 Intramuscular   Manufacturer: Moderna   Lot: [id number]   [id number]

## 2019-09-09 ENCOUNTER — Other Ambulatory Visit: Payer: Self-pay

## 2019-09-12 ENCOUNTER — Encounter: Payer: Self-pay | Admitting: Endocrinology

## 2019-09-12 ENCOUNTER — Ambulatory Visit (INDEPENDENT_AMBULATORY_CARE_PROVIDER_SITE_OTHER): Payer: Medicaid Other | Admitting: Endocrinology

## 2019-09-12 ENCOUNTER — Other Ambulatory Visit: Payer: Self-pay

## 2019-09-12 DIAGNOSIS — E1065 Type 1 diabetes mellitus with hyperglycemia: Secondary | ICD-10-CM

## 2019-09-12 MED ORDER — NOVOLIN N FLEXPEN 100 UNIT/ML ~~LOC~~ SUPN: 210.0000 [IU] | PEN_INJECTOR | SUBCUTANEOUS | 11 refills | Status: DC

## 2019-09-12 NOTE — Patient Instructions (Addendum)
check your blood sugar twice a day.  vary the time of day when you check, between before the 3 meals, and at bedtime.  also check if you have symptoms of your blood sugar being too high or too low.  please keep a record of the readings and bring it to your next appointment here (or you can bring the meter itself).  You can write it on any piece of paper.  please call us sooner if your blood sugar goes below 70, or if you have a lot of readings over 200. Your blood pressure is high today.  Please see your primary care provider soon, to have it rechecked.  Please increase the NPH insulin to 210 units each morning.   On this type of insulin schedule, you should eat meals on a regular schedule.  If a meal is missed or significantly delayed, your blood sugar could go low.   Please come back for a follow-up appointment in 6 weeks.

## 2019-09-12 NOTE — Progress Notes (Signed)
Subjective:    Patient ID: [name], male    DOB: 1975-07-11, 44 y.o.   MRN: [id number]  HPI Pt returns for f/u of diabetes mellitus: DM type: 1 Dx'ed: 123XX123 Complications: foot ulcer.  Therapy: insulin since dx.   DKA: he has had 3 episodes since dx--each with going off his insulin.   Severe hypoglycemia: never.   Pancreatitis: he had gallstone pancreatitis in late 2014.  SDOH: he takes QD insulin, due to noncompliance with more frequent dosing.  He self-administers insulin.    Other: He takes prednisone for pulm fibrosis; he changed Lantus to NPH, due to pattern of cbg's.   Interval history: No recent change in prednisone dosage.  no cbg record, but he says cbg's vary from 120-280.  It is in general higher as the day goes on.  Pt says he never misses the insulin.   Past Medical History:  Diagnosis Date  . Bipolar disorder ([place])   . Erectile dysfunction   . Essential hypertension   . GERD (gastroesophageal reflux disease)   . History of pneumonia   . Hyperlipidemia   . Interstitial lung disease (H[facility])    Pulmonary eosinophilia  . Type 2 diabetes mellitus ([place])     Past Surgical History:  Procedure Laterality Date  . APPENDECTOMY    . CHOLECYSTECTOMY N/A 07/11/2013   Procedure: LAPAROSCOPIC CHOLECYSTECTOMY;  Surgeon: Jamesetta So, MD;  Location: [facility];  Service: General;  Laterality: N/A;  . INCISION AND DRAINAGE ABSCESS / HEMATOMA OF BURSA / KNEE / THIGH    . KNEE SURGERY Bilateral   . VIDEO BRONCHOSCOPY Bilateral 02/09/2018   Procedure: VIDEO BRONCHOSCOPY WITHOUT FLUORO;  Surgeon: Brand Males, MD;  [facility];  Service: Cardiopulmonary;  Laterality: Bilateral;    Social History   Socioeconomic History  . Marital status: Single    Spouse name: Not on file  . Number of children: 0  . Years of education: Not on file  . Highest education level: Not on file  Occupational History  . Not on file  Tobacco Use  . Smoking status: Current Some  Day Smoker    Packs/day: 0.50    Years: 10.00    Pack years: 5.00    Types: Cigarettes  . Smokeless tobacco: Never Used  Substance and Sexual Activity  . Alcohol use: No  . Drug use: No  . Sexual activity: Never  Other Topics Concern  . Not on file  Social History Narrative  . Not on file   Social Determinants of Health   Financial Resource Strain:   . Difficulty of Paying Living Expenses:   Food Insecurity:   . Worried About Charity fundraiser in the Last Year:   . Arboriculturist in the Last Year:   Transportation Needs:   . Film/video editor (Medical):   Marland Kitchen Lack of Transportation (Non-Medical):   Physical Activity:   . Days of Exercise per Week:   . Minutes of Exercise per Session:   Stress:   . Feeling of Stress :   Social Connections:   . Frequency of Communication with Friends and Family:   . Frequency of Social Gatherings with Friends and Family:   . Attends Religious Services:   . Active Member of Clubs or Organizations:   . Attends Archivist Meetings:   Marland Kitchen Marital Status:   Intimate Partner Violence:   . Fear of Current or Ex-Partner:   . Emotionally Abused:   .  Physically Abused:   . Sexually Abused:     Current Outpatient Medications on File Prior to Visit  Medication Sig Dispense Refill  . albuterol (VENTOLIN HFA) 108 (90 Base) MCG/ACT inhaler Inhale 2 puffs into the lungs every 6 (six) hours as needed.    Marland Kitchen amoxicillin (AMOXIL) 500 MG capsule Take 500 mg by mouth at bedtime.    . divalproex (DEPAKOTE) 500 MG DR tablet Take 1,000 mg by mouth at bedtime.    . furosemide (LASIX) 40 MG tablet Take 40 mg by mouth as needed.     Marland Kitchen glucose blood (ONETOUCH VERIO) test strip 1 each by Other route 2 (two) times daily. And lancets 2/day 100 each 12  . lisinopril (ZESTRIL) 20 MG tablet TAKE ONE TABLET BY MOUTH ONCE DAILY. 30 tablet 0  . metoprolol tartrate (LOPRESSOR) 25 MG tablet Take 12.5 mg by mouth 2 (two) times daily.    . nicotine (NICODERM  CQ - DOSED IN MG/24 HOURS) 21 mg/24hr patch Place 21 mg onto the skin daily.    Marland Kitchen omeprazole (PRILOSEC) 20 MG capsule Take 20 mg by mouth at bedtime.    . predniSONE (DELTASONE) 20 MG tablet Take 3 tablets (60 mg total) by mouth daily with breakfast. (Patient taking differently: Take 20 mg by mouth daily with breakfast. ) 90 tablet 0  . risperidone (RISPERDAL) 4 MG tablet Take 4 mg by mouth at bedtime.     . sildenafil (VIAGRA) 100 MG tablet Take 0.5-1 tablets (50-100 mg total) daily as needed by mouth for erectile dysfunction. 30 tablet 11  . simvastatin (ZOCOR) 20 MG tablet Take 20 mg by mouth at bedtime.    . solifenacin (VESICARE) 5 MG tablet Take 5 mg by mouth at bedtime.     . SYMBICORT 160-4.5 MCG/ACT inhaler Inhale 2 puffs into the lungs 2 (two) times daily.     No current facility-administered medications on file prior to visit.    No Known Allergies  Family History  Problem Relation Age of Onset  . Diabetes Mother   . Diabetes Maternal Grandmother   . Pancreatitis Neg Hx   . Colon cancer Neg Hx     BP (!) 160/100 (BP Location: Left Arm, Patient Position: Sitting, Cuff Size: Large)   Pulse (!) 132   Ht 6\' 1"  (1.854 m)   Wt 236 lb (107 kg)   SpO2 92%   BMI 31.14 kg/m    Review of Systems He denies hypoglycemia    Objective:   Physical Exam VITAL SIGNS:  See vs page GENERAL: no distress Pulses: dorsalis pedis intact bilat.   MSK: no deformity of the feet CV: no leg edema Skin:  no ulcer on the feet.  normal color and temp on the feet. Neuro: sensation is intact to touch on the feet  Lab Results  Component Value Date   HGBA1C 12.7 (A) 08/09/2019       Assessment & Plan:  Insulin-requiring type 2 DM: he needs increased rx.  HTN: is noted today.   Patient Instructions  check your blood sugar twice a day.  vary the time of day when you check, between before the 3 meals, and at bedtime.  also check if you have symptoms of your blood sugar being too high or too  low.  please keep a record of the readings and bring it to your next appointment here (or you can bring the meter itself).  You can write it on any piece of paper.  please  call us sooner if your blood sugar goes below 70, or if you have a lot of readings over 200. Your blood pressure is high today.  Please see your primary care provider soon, to have it rechecked.  Please increase the NPH insulin to 210 units each morning.   On this type of insulin schedule, you should eat meals on a regular schedule.  If a meal is missed or significantly delayed, your blood sugar could go low.   Please come back for a follow-up appointment in 6 weeks.

## 2019-09-14 ENCOUNTER — Ambulatory Visit: Payer: Medicaid Other | Attending: Internal Medicine

## 2019-09-14 DIAGNOSIS — Z23 Encounter for immunization: Secondary | ICD-10-CM

## 2019-09-14 NOTE — Progress Notes (Signed)
   Covid-19 Vaccination Clinic  Name:  [name]    MRN: [id number] DOB: 1975-11-08  09/14/2019  [name] was observed post Covid-19 immunization for 15 minutes without incident. He was provided with Vaccine Information Sheet and instruction to access the V-Safe system.   [name] was instructed to call 911 with any severe reactions post vaccine: Marland Kitchen Difficulty breathing  . Swelling of face and throat  . A fast heartbeat  . A bad rash all over body  . Dizziness and weakness   Immunizations Administered    Name Date Dose VIS Date Route   Moderna COVID-19 Vaccine 09/14/2019  1:33 PM 0.5 mL 05/03/2019 Intramuscular   Manufacturer: Moderna   Lot: [id number]   [id number]

## 2019-10-17 ENCOUNTER — Other Ambulatory Visit: Payer: Self-pay

## 2019-10-17 ENCOUNTER — Other Ambulatory Visit: Payer: Self-pay | Admitting: Endocrinology

## 2019-10-17 ENCOUNTER — Telehealth: Payer: Self-pay

## 2019-10-17 DIAGNOSIS — E1065 Type 1 diabetes mellitus with hyperglycemia: Secondary | ICD-10-CM

## 2019-10-17 MED ORDER — NOVOLIN N FLEXPEN 100 UNIT/ML ~~LOC~~ SUPN: 210.0000 [IU] | PEN_INJECTOR | SUBCUTANEOUS | 11 refills | Status: DC

## 2019-10-17 NOTE — Telephone Encounter (Signed)
PRIOR AUTHORIZATION  PA initiation date: 10/17/19  Medication: [place]: [place] [facility] Submission completed electronically through [employer] My Meds: No  [place] [facility] PA form completed: Yes  Will await insurance response re: approval/denial.

## 2019-10-18 MED ORDER — INSULIN NPH (HUMAN) (ISOPHANE) 100 UNIT/ML ~~LOC~~ SUSP: 210.0000 [IU] | SUBCUTANEOUS | 3 refills | Status: DC

## 2019-10-18 NOTE — Telephone Encounter (Signed)
Called [place] [facility] to follow up on status of PA, [id number], spoke with customer service agent Elvis Coil. DENIAL  Medication: [place]: [place] [facility] PA response: DENIED Rationale: MUST HAVE TRIED AND FAILED HUMULIN N VIALS  Routing this message to Dr. Loanne Drilling for him to review and address. Will await his response.

## 2019-10-18 NOTE — Telephone Encounter (Signed)
done

## 2019-10-18 NOTE — Addendum Note (Signed)
Addended by: Renato Shin on: 10/18/2019 04:47 PM   Modules accepted: Orders

## 2019-10-18 NOTE — Telephone Encounter (Signed)
Called pt and informed him about the denial and rationale received from [facility]. Pt states he is comfortable drawing up insulin from vial. Advised I will route this message back to Dr. Loanne Drilling for him to place the new orders.   Pt also requested to change appt to a VV tomorrow. States he is leaving town.

## 2019-10-18 NOTE — Telephone Encounter (Signed)
NPH is the insulin you need, based on the pattern of your cbg's.  Medicaid does not cover NPH pens.  Vials OK?

## 2019-10-19 ENCOUNTER — Other Ambulatory Visit: Payer: Self-pay

## 2019-10-19 ENCOUNTER — Telehealth: Payer: Medicaid Other | Admitting: Endocrinology

## 2019-10-19 DIAGNOSIS — E1065 Type 1 diabetes mellitus with hyperglycemia: Secondary | ICD-10-CM

## 2019-10-19 MED ORDER — "INSULIN SYRINGE 30G X 1/2"" 1 ML MISC": 1.0000 | Freq: Three times a day (TID) | 0 refills | Status: DC

## 2019-10-19 NOTE — Progress Notes (Signed)
error 

## 2019-10-19 NOTE — Telephone Encounter (Signed)
Outpatient Medication Detail   Disp Refills Start End   insulin NPH Human (HUMULIN N) 100 UNIT/ML injection 210 mL 3 10/18/2019    Sig - Route: Inject 2.1 mLs (210 Units total) into the skin every morning. And syringes 3/day - Subcutaneous   Sent to pharmacy as: insulin NPH Human (HUMULIN N) 100 UNIT/ML injection   E-Prescribing Status: Receipt confirmed by pharmacy (10/18/2019  4:48 PM EDT)

## 2019-10-25 ENCOUNTER — Ambulatory Visit: Payer: Medicaid Other | Admitting: Endocrinology

## 2019-11-01 ENCOUNTER — Ambulatory Visit (INDEPENDENT_AMBULATORY_CARE_PROVIDER_SITE_OTHER): Payer: Medicaid Other | Admitting: Endocrinology

## 2019-11-01 ENCOUNTER — Encounter: Payer: Self-pay | Admitting: Endocrinology

## 2019-11-01 ENCOUNTER — Other Ambulatory Visit: Payer: Self-pay

## 2019-11-01 VITALS — BP 170/90 | HR 133 | Ht 73.0 in | Wt 231.0 lb

## 2019-11-01 DIAGNOSIS — E1065 Type 1 diabetes mellitus with hyperglycemia: Secondary | ICD-10-CM | POA: Diagnosis not present

## 2019-11-01 LAB — POCT GLYCOSYLATED HEMOGLOBIN (HGB A1C): Hemoglobin A1C: 9.3 % — AB (ref 4.0–5.6)

## 2019-11-01 MED ORDER — INSULIN NPH (HUMAN) (ISOPHANE) 100 UNIT/ML ~~LOC~~ SUSP: 220.0000 [IU] | SUBCUTANEOUS | 11 refills | Status: DC

## 2019-11-01 NOTE — Progress Notes (Signed)
Subjective:    Patient ID: [name], male    DOB: July 31, 1975, 44 y.o.   MRN: [id number]  HPI Pt returns for f/u of diabetes mellitus: DM type: 1 Dx'ed: [id number] Complications: foot ulcer.  Therapy: insulin since dx.   DKA: he has had 3 episodes since dx--each with going off his insulin.   Severe hypoglycemia: never.   Pancreatitis: he had gallstone pancreatitis in late 2014.  SDOH: he takes QD insulin, due to noncompliance with more frequent dosing.  He self-administers insulin.    Other: He takes prednisone for pulm fibrosis; he changed Lantus to NPH, due to pattern of cbg's.   Interval history: No recent change in prednisone dosage.  no cbg record, but he says cbg's vary from 90-200's.  It is in general higher as the day goes on.  Pt says he never misses the insulin.   Past Medical History:  Diagnosis Date   Bipolar disorder [facility])    Erectile dysfunction    Essential hypertension    GERD (gastroesophageal reflux disease)    History of pneumonia    Hyperlipidemia    Interstitial lung disease ([place])    Pulmonary eosinophilia   Type 2 diabetes mellitus ([place])     Past Surgical History:  Procedure Laterality Date   APPENDECTOMY     CHOLECYSTECTOMY N/A 07/11/2013   Procedure: LAPAROSCOPIC CHOLECYSTECTOMY;  Surgeon: Jamesetta So, MD;  Location: [facility];  Service: General;  Laterality: N/A;   INCISION AND DRAINAGE ABSCESS / HEMATOMA OF BURSA / KNEE / THIGH     KNEE SURGERY Bilateral    VIDEO BRONCHOSCOPY Bilateral 02/09/2018   Procedure: VIDEO BRONCHOSCOPY WITHOUT FLUORO;  Surgeon: Brand Males, MD;  Location: [facility];  Service: Cardiopulmonary;  Laterality: Bilateral;    Social History   Socioeconomic History   Marital status: Single    Spouse name: Not on file   Number of children: 0   Years of education: Not on file   Highest education level: Not on file  Occupational History   Not on file  Tobacco Use   Smoking status: Current Some  Day Smoker    Packs/day: 0.50    Years: 10.00    Pack years: 5.00    Types: Cigarettes   Smokeless tobacco: Never Used  Substance and Sexual Activity   Alcohol use: No   Drug use: No   Sexual activity: Never  Other Topics Concern   Not on file  Social History Narrative   Not on file   Social Determinants of Health   Financial Resource Strain:    Difficulty of Paying Living Expenses:   Food Insecurity:    Worried About Charity fundraiser in the Last Year:    Arboriculturist in the Last Year:   Transportation Needs:    Film/video editor (Medical):    Lack of Transportation (Non-Medical):   Physical Activity:    Days of Exercise per Week:    Minutes of Exercise per Session:   Stress:    Feeling of Stress :   Social Connections:    Frequency of Communication with Friends and Family:    Frequency of Social Gatherings with Friends and Family:    Attends Religious Services:    Active Member of Clubs or Organizations:    Attends Archivist Meetings:    Marital Status:   Intimate Partner Violence:    Fear of Current or Ex-Partner:    Emotionally Abused:  Physically Abused:    Sexually Abused:     Current Outpatient Medications on File Prior to Visit  Medication Sig Dispense Refill   albuterol (VENTOLIN HFA) 108 (90 Base) MCG/ACT inhaler Inhale 2 puffs into the lungs every 6 (six) hours as needed.     amoxicillin (AMOXIL) 500 MG capsule Take 500 mg by mouth at bedtime.     divalproex (DEPAKOTE) 500 MG DR tablet Take 1,000 mg by mouth at bedtime.     furosemide (LASIX) 40 MG tablet Take 40 mg by mouth as needed.      glucose blood (ONETOUCH VERIO) test strip 1 each by Other route 2 (two) times daily. And lancets 2/day 100 each 12   Insulin Syringe-Needle U-100 (INSULIN SYRINGE 1CC/30GX1/2") 30G X 1/2" 1 ML MISC 1 tablet by Does not apply route 3 (three) times daily. E11.9 270 each 0   lisinopril (ZESTRIL) 20 MG tablet TAKE ONE  TABLET BY MOUTH ONCE DAILY. 30 tablet 0   metoprolol tartrate (LOPRESSOR) 25 MG tablet Take 12.5 mg by mouth 2 (two) times daily.     nicotine (NICODERM CQ - DOSED IN MG/24 HOURS) 21 mg/24hr patch Place 21 mg onto the skin daily.     omeprazole (PRILOSEC) 20 MG capsule Take 20 mg by mouth at bedtime.     predniSONE (DELTASONE) 20 MG tablet Take 3 tablets (60 mg total) by mouth daily with breakfast. (Patient taking differently: Take 20 mg by mouth daily with breakfast. ) 90 tablet 0   risperidone (RISPERDAL) 4 MG tablet Take 4 mg by mouth at bedtime.      sildenafil (VIAGRA) 100 MG tablet Take 0.5-1 tablets (50-100 mg total) daily as needed by mouth for erectile dysfunction. 30 tablet 11   simvastatin (ZOCOR) 20 MG tablet Take 20 mg by mouth at bedtime.     solifenacin (VESICARE) 5 MG tablet Take 5 mg by mouth at bedtime.      SYMBICORT 160-4.5 MCG/ACT inhaler Inhale 2 puffs into the lungs 2 (two) times daily.     No current facility-administered medications on file prior to visit.    No Known Allergies  Family History  Problem Relation Age of Onset   Diabetes Mother    Diabetes Maternal Grandmother    Pancreatitis Neg Hx    Colon cancer Neg Hx     BP (!) 170/90    Pulse (!) 133    Ht 6\' 1"  (1.854 m)    Wt 231 lb (104.8 kg)    SpO2 93%    BMI 30.48 kg/m    Review of Systems He denies hypoglycemia.     Objective:   Physical Exam VITAL SIGNS:  See vs page GENERAL: no distress Pulses: dorsalis pedis intact bilat.   MSK: no deformity of the feet CV: no leg edema Skin:  no ulcer on the feet.  normal color and temp on the feet. Neuro: sensation is intact to touch on the feet.    A1c=9.3% Lab Results  Component Value Date   TSH 1.36 01/26/2019       Assessment & Plan:  HTN: is noted today Type 1 DM, with foot ulcer: he needs increased rx Tachycardia: this was also noted at last ov here: situational vs autonomic dysfunction. We'll follow.  Patient Instructions   Your blood pressure is high today.  Please see your primary care provider soon, to have it rechecked check your blood sugar twice a day.  vary the time of day when you check,  between before the 3 meals, and at bedtime.  also check if you have symptoms of your blood sugar being too high or too low.  please keep a record of the readings and bring it to your next appointment here (or you can bring the meter itself).  You can write it on any piece of paper.  please call us sooner if your blood sugar goes below 70, or if you have a lot of readings over 200.  Please increase the NPH insulin to 220 units each morning.   On this type of insulin schedule, you should eat meals on a regular schedule.  If a meal is missed or significantly delayed, your blood sugar could go low.   Please come back for a follow-up appointment in 2 months.

## 2019-11-01 NOTE — Patient Instructions (Addendum)
Your blood pressure is high today.  Please see your primary care provider soon, to have it rechecked check your blood sugar twice a day.  vary the time of day when you check, between before the 3 meals, and at bedtime.  also check if you have symptoms of your blood sugar being too high or too low.  please keep a record of the readings and bring it to your next appointment here (or you can bring the meter itself).  You can write it on any piece of paper.  please call us sooner if your blood sugar goes below 70, or if you have a lot of readings over 200.  Please increase the NPH insulin to 220 units each morning.   On this type of insulin schedule, you should eat meals on a regular schedule.  If a meal is missed or significantly delayed, your blood sugar could go low.   Please come back for a follow-up appointment in 2 months.

## 2019-11-06 ENCOUNTER — Encounter (HOSPITAL_COMMUNITY): Payer: Self-pay | Admitting: *Deleted

## 2019-11-06 ENCOUNTER — Other Ambulatory Visit: Payer: Self-pay

## 2019-11-06 ENCOUNTER — Emergency Department (HOSPITAL_COMMUNITY): Payer: Medicaid Other

## 2019-11-06 DIAGNOSIS — Z79899 Other long term (current) drug therapy: Secondary | ICD-10-CM | POA: Insufficient documentation

## 2019-11-06 DIAGNOSIS — S62320A Displaced fracture of shaft of second metacarpal bone, right hand, initial encounter for closed fracture: Secondary | ICD-10-CM | POA: Diagnosis not present

## 2019-11-06 DIAGNOSIS — F17210 Nicotine dependence, cigarettes, uncomplicated: Secondary | ICD-10-CM | POA: Insufficient documentation

## 2019-11-06 DIAGNOSIS — W01198A Fall on same level from slipping, tripping and stumbling with subsequent striking against other object, initial encounter: Secondary | ICD-10-CM | POA: Insufficient documentation

## 2019-11-06 DIAGNOSIS — E119 Type 2 diabetes mellitus without complications: Secondary | ICD-10-CM | POA: Diagnosis not present

## 2019-11-06 DIAGNOSIS — Y999 Unspecified external cause status: Secondary | ICD-10-CM | POA: Diagnosis not present

## 2019-11-06 DIAGNOSIS — Y939 Activity, unspecified: Secondary | ICD-10-CM | POA: Insufficient documentation

## 2019-11-06 DIAGNOSIS — Y929 Unspecified place or not applicable: Secondary | ICD-10-CM | POA: Diagnosis not present

## 2019-11-06 DIAGNOSIS — I10 Essential (primary) hypertension: Secondary | ICD-10-CM | POA: Insufficient documentation

## 2019-11-06 DIAGNOSIS — Z794 Long term (current) use of insulin: Secondary | ICD-10-CM | POA: Insufficient documentation

## 2019-11-06 DIAGNOSIS — S6991XA Unspecified injury of right wrist, hand and finger(s), initial encounter: Secondary | ICD-10-CM | POA: Diagnosis present

## 2019-11-06 NOTE — ED Triage Notes (Signed)
Pt states that he fell last night, hitting the door frame, c/o pain to right hand and "knot" to lower back area, swelling noted to right hand,

## 2019-11-07 ENCOUNTER — Emergency Department (HOSPITAL_COMMUNITY)
Admission: EM | Admit: 2019-11-07 | Discharge: 2019-11-07 | Disposition: A | Discharge status: Home / Self Care | Payer: Medicaid Other | Attending: Emergency Medicine | Admitting: Emergency Medicine

## 2019-11-07 DIAGNOSIS — S62320A Displaced fracture of shaft of second metacarpal bone, right hand, initial encounter for closed fracture: Secondary | ICD-10-CM

## 2019-11-07 NOTE — Discharge Instructions (Signed)
Apply ice for 30 minutes at a time, 4 times a day.  Take acetaminophen and/or ibuprofen as needed for pain.  Combining the 2 gives you better pain relief then either one by itself.

## 2019-11-07 NOTE — ED Provider Notes (Signed)
[facility] EMERGENCY DEPARTMENT Provider Note   CSN: [id number] Arrival date & time: 11/06/19  2253   History Chief Complaint  Patient presents with   [name]    [name] is a 44 y.o. male.  The history is provided by the patient.  Fall  He has history of hypertension, diabetes, hyperlipidemia and injured his right hand yesterday.  He tripped and fell hitting the dorsum of his right hand against the wall.  Is also complaining of pain in the right lumbar area where he he has noted a knot on his back.  Pain is rated at 9/10.  He has taken acetaminophen for pain with partial, temporary relief.  He denies head injury or loss of consciousness.  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   Chronic respiratory failure with hypoxia ([place]) 02/16/2018   ILD (interstitial lung disease) ([place])    Hypogonadism male 09/12/2014   Uncontrolled type 1 diabetes mellitus ([place]) 02/18/2014   Cholelithiasis 06/06/2013   Bipolar 1 disorder, mixed, moderate ([place]) 06/04/2013   Abnormal LFTs 06/03/2013   Pancreatitis, acute 06/03/2013   DKA (diabetic ketoacidosis) ([place]) 06/03/2013   DKA (diabetic ketoacidoses) ([place]) 06/03/2013    Past Surgical History:  Procedure Laterality Date   APPENDECTOMY     CHOLECYSTECTOMY N/A 07/11/2013   Procedure: LAPAROSCOPIC CHOLECYSTECTOMY;  Surgeon: Jamesetta So, MD;  Location: [facility];  Service: General;  Laterality: N/A;   INCISION AND DRAINAGE ABSCESS / HEMATOMA OF BURSA / KNEE / THIGH     KNEE SURGERY Bilateral    VIDEO BRONCHOSCOPY Bilateral 02/09/2018   Procedure: VIDEO BRONCHOSCOPY  WITHOUT FLUORO;  Surgeon: Brand Males, MD;  [facility];  Service: Cardiopulmonary;  Laterality: Bilateral;       Family History  Problem Relation Age of Onset   Diabetes Mother    Diabetes Maternal Grandmother    Pancreatitis Neg Hx    Colon cancer Neg Hx     Social History   Tobacco Use   Smoking status: Current Some Day Smoker    Packs/day: 0.50    Years: 10.00    Pack years: 5.00    Types: Cigarettes   Smokeless tobacco: Never Used  Substance Use Topics   Alcohol use: No   Drug use: No    Home Medications Prior to Admission medications   Medication Sig Start Date End Date Taking? Authorizing Provider  albuterol (VENTOLIN HFA) 108 (90 Base) MCG/ACT inhaler Inhale 2 puffs into the lungs every 6 (six) hours as needed.    [provider]  amoxicillin (AMOXIL) 500 MG capsule Take 500 mg by mouth at bedtime.    [provider]  divalproex (DEPAKOTE) 500 MG DR tablet Take 1,000 mg by mouth at bedtime.    [provider]  furosemide (LASIX) 40 MG tablet Take 40 mg by mouth as needed.     [provider]  glucose blood (ONETOUCH VERIO) test strip 1 each by Other route 2 (two) times daily. And lancets 2/day 03/08/18   Renato Shin, MD  insulin NPH Human (HUMULIN N) 100 UNIT/ML injection Inject 2.2  mLs (220 Units total) into the skin every morning. And syringes 3/day 11/01/19   Renato Shin, MD  Insulin Syringe-Needle U-100 (INSULIN SYRINGE 1CC/30GX1/2") 30G X 1/2" 1 ML MISC 1 tablet by Does not apply route 3 (three) times daily. E11.9 10/19/19   Renato Shin, MD  lisinopril (ZESTRIL) 20 MG tablet TAKE ONE TABLET BY MOUTH ONCE DAILY. 07/11/19   Corum, Rex Kras, MD  metoprolol tartrate (LOPRESSOR) 25 MG tablet Take 12.5 mg by mouth 2 (two) times daily.    [provider]  nicotine (NICODERM CQ - DOSED IN MG/24 HOURS) 21 mg/24hr patch Place 21 mg onto the skin daily. 04/06/19   [provider]  omeprazole  (PRILOSEC) 20 MG capsule Take 20 mg by mouth at bedtime.    [provider]  predniSONE (DELTASONE) 20 MG tablet Take 3 tablets (60 mg total) by mouth daily with breakfast. Patient taking differently: Take 20 mg by mouth daily with breakfast.  02/16/18   Parrett, Fonnie Mu, NP  risperidone (RISPERDAL) 4 MG tablet Take 4 mg by mouth at bedtime.     [provider]  sildenafil (VIAGRA) 100 MG tablet Take 0.5-1 tablets (50-100 mg total) daily as needed by mouth for erectile dysfunction. 04/08/17   Renato Shin, MD  simvastatin (ZOCOR) 20 MG tablet Take 20 mg by mouth at bedtime.    [provider]  solifenacin (VESICARE) 5 MG tablet Take 5 mg by mouth at bedtime.     [provider]  SYMBICORT 160-4.5 MCG/ACT inhaler Inhale 2 puffs into the lungs 2 (two) times daily. 03/17/19   [provider]    Allergies    Patient has no known allergies.  Review of Systems   Review of Systems  All other systems reviewed and are negative.   Physical Exam Updated Vital Signs BP 140/90    Pulse (!) 120    Temp 98.6 F (37 C)    Resp 16    Ht 6\' 1"  (1.854 m)    Wt 104.8 kg    SpO2 97%    BMI 30.48 kg/m   Physical Exam Vitals and nursing note reviewed.   44 year old male, resting comfortably and in no acute distress. Vital signs are significant for borderline elevated blood pressure and rapid heart rate. Oxygen saturation is 97%, which is normal. Head is normocephalic and atraumatic. PERRLA, EOMI. Oropharynx is clear. Neck is nontender and supple without adenopathy or JVD. Back is nontender and there is no CVA tenderness.  Where patient noted a knot in the right lumbar area is actually the right posterior iliac crest and is nontender on exam. Lungs are clear without rales, wheezes, or rhonchi. Chest is nontender. Heart has regular rate and rhythm without murmur. Abdomen is soft, flat, nontender without masses or hepatosplenomegaly and peristalsis is  normoactive. Extremities: Moderate soft tissue swelling of the right hand.  There is tenderness rather diffusely.  Distal neurovascular exam is intact with normal sensation, prompt capillary refill. Skin is warm and dry without rash. Neurologic: Mental status is normal, cranial nerves are intact, there are no motor or sensory deficits.  ED Results / Procedures / Treatments    Radiology DG Hand Complete Right  Result Date: 11/07/2019 CLINICAL DATA:  Fall EXAM: RIGHT HAND - COMPLETE 3+ VIEW COMPARISON:  None. FINDINGS: Mildly impacted and volar angulated fracture of the distal second metacarpal without clear intra-articular extension. Approximately 40 degrees of angulation across the fracture line. Soft tissue swelling noted along the  dorsal aspect of the hand most pronounced adjacent the second metacarpal fracture. No other acute fracture or traumatic osseous injury is identified. Minimal arthrosis of the first carpometacarpal and triscaphe joints. IMPRESSION: Mildly impacted and volar angulated fracture of the distal second metacarpal metadiaphysis without clear intra-articular extension. Overlying soft tissue swelling. Electronically Signed   By: Lovena Le M.D.   On: 11/07/2019 01:06    Procedures .Splint Application  Date/Time: 11/07/2019 2:24 AM Performed by: Delora Fuel, MD Authorized by: Delora Fuel, MD   Consent:    Consent obtained:  Verbal   Consent given by:  Patient   Risks discussed:  Pain, swelling and numbness   Alternatives discussed:  No treatment Pre-procedure details:    Sensation:  Normal   Skin color:  Normal Procedure details:    Laterality:  Right   Location:  Wrist   Wrist:  R wrist   Strapping: no     Splint type:  Volar short arm   Supplies:  Elastic bandage, Ortho-Glass and cotton padding Post-procedure details:    Pain:  Improved   Sensation:  Normal   Skin color:  Normal   Patient tolerance of procedure:  Tolerated well, no immediate  complications Comments:     Splint applied by RN.  Neurovascular status evaluated by me following splint application.     Medications Ordered in ED Medications - No data to display  ED Course  I have reviewed the triage vital signs and the nursing notes.  Pertinent imaging results that were available during my care of the patient were reviewed by me and considered in my medical decision making (see chart for details).  MDM Rules/Calculators/A&P Injury to right hand from fall at home.  X-rays show an angulated fracture of the second metacarpal.  He is placed in a volar splint and referred to orthopedics for follow-up.  Initial vital signs were significant for tachycardia, vital signs are rechecked prior to discharge, heart rate has come down to 110.  She still mildly elevated.  Patient however clinically looks well and I feel is safe for discharge.  Final Clinical Impression(s) / ED Diagnoses Final diagnoses:  Closed fracture of shaft of second metacarpal bone of right hand, initial encounter    Rx / DC Orders ED Discharge Orders    None       Delora Fuel, MD 63/78/58 [phone]

## 2019-11-10 ENCOUNTER — Other Ambulatory Visit: Payer: Self-pay

## 2019-11-10 ENCOUNTER — Ambulatory Visit: Payer: Medicaid Other | Admitting: Orthopedic Surgery

## 2019-11-10 VITALS — BP 134/86 | HR 131 | Ht 73.0 in | Wt 232.5 lb

## 2019-11-10 DIAGNOSIS — S62350A Nondisplaced fracture of shaft of second metacarpal bone, right hand, initial encounter for closed fracture: Secondary | ICD-10-CM | POA: Diagnosis not present

## 2019-11-10 NOTE — Patient Instructions (Signed)

## 2019-11-10 NOTE — Progress Notes (Signed)
EMERGENCY ROOM FOLLOW UP  NEW PROBLEM/PATIENT   Patient ID: [name], male   DOB: Apr 23, 1976, 44 y.o.   MRN: [id number]  Emergency room record from (date) June 5 has been reviewed and this is included by reference and includes the review of systems with the following addition:   Chief Complaint  Patient presents with  . Hand Pain    R/DOI 11/05/19/ tripped over doorsill and tried catching his self and hand folded under    HPI [name] is a 44 y.o. male.  Presents for evaluation of injury to the right hand  44 year old male mechanical fall landed on his right hand sustained a second metacarpal fracture near the head neck mild angulation appropriately splinted comes in for follow-up complains of pain swelling and some stiffness   Review of Systems Review of Systems  Constitutional: Negative for activity change, appetite change and chills.  Neurological: Negative for numbness.     has a past medical history of Bipolar disorder ([place]), Erectile dysfunction, Essential hypertension, GERD (gastroesophageal reflux disease), History of pneumonia, Hyperlipidemia, Interstitial lung disease ([place]), and Type 2 diabetes mellitus ([place]).   Past Surgical History:  Procedure Laterality Date  . APPENDECTOMY    . CHOLECYSTECTOMY N/A 07/11/2013   Procedure: LAPAROSCOPIC CHOLECYSTECTOMY;  Surgeon: Jamesetta So, MD;  Location: [facility];  Service: General;  Laterality: N/A;  . INCISION AND DRAINAGE ABSCESS / HEMATOMA OF BURSA / KNEE / THIGH    . KNEE SURGERY Bilateral   . VIDEO BRONCHOSCOPY Bilateral 02/09/2018   Procedure: VIDEO BRONCHOSCOPY WITHOUT FLUORO;  Surgeon: Brand Males, MD;  Location: [facility];  Service: Cardiopulmonary;  Laterality: Bilateral;    Family History  Problem Relation Age of Onset  . Diabetes Mother   . Diabetes Maternal Grandmother   . Pancreatitis Neg Hx   . Colon cancer Neg Hx     Social History Social History   Tobacco Use  . Smoking status:  Current Some Day Smoker    Packs/day: 0.50    Years: 10.00    Pack years: 5.00    Types: Cigarettes  . Smokeless tobacco: Never Used  Vaping Use  . Vaping Use: Never used  Substance Use Topics  . Alcohol use: No  . Drug use: No    No Known Allergies  Current Outpatient Medications  Medication Sig Dispense Refill  . albuterol (VENTOLIN HFA) 108 (90 Base) MCG/ACT inhaler Inhale 2 puffs into the lungs every 6 (six) hours as needed.    . divalproex (DEPAKOTE) 500 MG DR tablet Take 1,000 mg by mouth at bedtime.    . furosemide (LASIX) 40 MG tablet Take 40 mg by mouth as needed.     Marland Kitchen glucose blood (ONETOUCH VERIO) test strip 1 each by Other route 2 (two) times daily. And lancets 2/day 100 each 12  . insulin NPH Human (HUMULIN N) 100 UNIT/ML injection Inject 2.2 mLs (220 Units total) into the skin every morning. And syringes 3/day 80 mL 11  . Insulin Syringe-Needle U-100 (INSULIN SYRINGE 1CC/30GX1/2") 30G X 1/2" 1 ML MISC 1 tablet by Does not apply route 3 (three) times daily. E11.9 270 each 0  . lisinopril (ZESTRIL) 20 MG tablet TAKE ONE TABLET BY MOUTH ONCE DAILY. 30 tablet 0  . nicotine (NICODERM CQ - DOSED IN MG/24 HOURS) 21 mg/24hr patch Place 21 mg onto the skin daily.    Marland Kitchen omeprazole (PRILOSEC) 20 MG capsule Take 20 mg by mouth at bedtime.    . risperidone (  RISPERDAL) 4 MG tablet Take 4 mg by mouth at bedtime.     . sildenafil (VIAGRA) 100 MG tablet Take 0.5-1 tablets (50-100 mg total) daily as needed by mouth for erectile dysfunction. 30 tablet 11  . simvastatin (ZOCOR) 20 MG tablet Take 20 mg by mouth at bedtime.    . solifenacin (VESICARE) 5 MG tablet Take 5 mg by mouth at bedtime.     . SYMBICORT 160-4.5 MCG/ACT inhaler Inhale 2 puffs into the lungs 2 (two) times daily.    . metoprolol tartrate (LOPRESSOR) 25 MG tablet Take 12.5 mg by mouth 2 (two) times daily. (Patient not taking: Reported on 11/10/2019)     No current facility-administered medications for this visit.     Physical Exam BP 134/86   Pulse (!) 131   Ht 6\' 1"  (1.854 m)   Wt 232 lb 8 oz (105.5 kg)   BMI 30.67 kg/m  Body mass index is 30.67 kg/m.  Well developed and well nourished  Stands with normal weight bearing line Alert and oriented x 3  Normal affect and mood  Ortho Exam   Data Reviewed IMAGING From THE ER AND THE REPORT ARE REVIEWED, MY INTERPRETATION OF THE IMAGE(S) IS :  X-ray shows a second metacarpal neck fracture minimal angulation Assessment  Metatarsal fracture #2 closed recommend nonoperative treatment  Plan   Safe position splinting of the index and long finger  X-ray in 2 weeks check position   Arther Abbott, MD 11/10/2019 2:44 PM

## 2019-11-11 ENCOUNTER — Other Ambulatory Visit: Payer: Self-pay

## 2019-11-11 ENCOUNTER — Ambulatory Visit (HOSPITAL_COMMUNITY): Payer: Medicaid Other | Attending: Orthopedic Surgery | Admitting: Occupational Therapy

## 2019-11-11 ENCOUNTER — Encounter (HOSPITAL_COMMUNITY): Payer: Self-pay | Admitting: Occupational Therapy

## 2019-11-11 DIAGNOSIS — R29898 Other symptoms and signs involving the musculoskeletal system: Secondary | ICD-10-CM | POA: Diagnosis present

## 2019-11-11 DIAGNOSIS — M79641 Pain in right hand: Secondary | ICD-10-CM | POA: Diagnosis present

## 2019-11-11 NOTE — Therapy (Signed)
[place], [place], [address] Phone: [phone]   Fax:  [phone]  Occupational Therapy Evaluation  Patient Details  Name: [name] MRN: [id number] Date of Birth: 1976-05-11 Referring Provider (OT): Dr. Arther Abbott   Encounter Date: 11/11/2019   OT End of Session - 11/11/19 1018    Visit Number 1    Number of Visits 1    Date for OT Re-Evaluation 11/18/19    Authorization Type Medicaid    Authorization Time Period no follow up visits requested    OT Start Time 0802    OT Stop Time 0850    OT Time Calculation (min) 48 min    Activity Tolerance Patient tolerated treatment well    Behavior During Therapy [facility] for tasks assessed/performed           Past Medical History:  Diagnosis Date  . Bipolar disorder ([place])   . Erectile dysfunction   . Essential hypertension   . GERD (gastroesophageal reflux disease)   . History of pneumonia   . Hyperlipidemia   . Interstitial lung disease (HCC)    Pulmonary eosinophilia  . Type 2 diabetes mellitus ([place])     Past Surgical History:  Procedure Laterality Date  . APPENDECTOMY    . CHOLECYSTECTOMY N/A 07/11/2013   Procedure: LAPAROSCOPIC CHOLECYSTECTOMY;  Surgeon: Jamesetta So, MD;  Location: [facility];  Service: General;  Laterality: N/A;  . INCISION AND DRAINAGE ABSCESS / HEMATOMA OF BURSA / KNEE / THIGH    . KNEE SURGERY Bilateral   . VIDEO BRONCHOSCOPY Bilateral 02/09/2018   Procedure: VIDEO BRONCHOSCOPY WITHOUT FLUORO;  Surgeon: Brand Males, MD;  [facility];  Service: Cardiopulmonary;  Laterality: Bilateral;    There were no vitals filed for this visit.   Subjective Assessment - 11/11/19 1015    Subjective  S: The swelling has gone down some    Pertinent History Pt is a 44 y/o male s/p right 2nd metacarpal fracture on 11/06/19 after tripping over a doorframe and hitting his hand against the wall. Pt with mildly angulated 2nd  metacarpal fx near the metacarpal head. Pt was referred to occupational therapy for splint fabrication by Dr. Arther Abbott.    Patient Stated Goals For my hand to heal.    Currently in Pain? Yes    Pain Score 5     Pain Location Hand    Pain Orientation Right    Pain Descriptors / Indicators Aching;Discomfort    Pain Type Acute pain    Pain Onset In the past 7 days    Pain Frequency Intermittent    Aggravating Factors  movement    Pain Relieving Factors rest, tylenol    Effect of Pain on Daily Activities unable to use right hand for ADLs    Multiple Pain Sites No             [facility] OT Assessment - 11/11/19 0801      Assessment   Medical Diagnosis s/p right 2nd metacarpal fracture    Referring Provider (OT) Dr. Arther Abbott    Onset Date/Surgical Date 11/06/19    Hand Dominance Right    Next MD Visit 09/08/19    Prior Therapy None      Balance Screen   Has the patient fallen in the past 6 months Yes    How many times? 1    Has the patient had a decrease in activity level because of a fear of  falling?  No    Is the patient reluctant to leave their home because of a fear of falling?  No                    OT Treatments/Exercises (OP) - 11/11/19 1018      Splinting   Splinting Radial gutter splint fabricated with 2nd and 3rd digits at 60-70 degrees MP flexion. PIP and DIP joints at approximately 10-20 degrees flexion. Wrist in neutral                  OT Education - 11/11/19 1017    Education Details provided splint wear and care instructions    Person(s) Educated Patient    Methods Explanation;Handout    Comprehension Verbalized understanding            OT Short Term Goals - 11/11/19 1023      OT SHORT TERM GOAL #1   Title Pt will be provided with and educated on splint wear and care.    Time 1    Period Days    Status Achieved    Target Date 11/11/19                    Plan - 11/11/19 1020    Clinical Impression Statement  A: Pt is a 44 y/o male s/p fall resulting in right 2nd metacarpal fx. Custom fabricated radial gutter (safe position) splint with 2nd and 3rd digits at 60-70 degrees MP flexion. PIP and DIP joints at approximately 10-20 degrees flexion and wrist in neutral. Strapping placed at proximal forearm, wrist, palm, and at proximal phalanx. Pt educated on splint wear and care, was instructed to call office if has any discomfort or pressure points on the splint.    OT Occupational Profile and History Problem Focused Assessment - Including review of records relating to presenting problem    Occupational performance deficits (Please refer to evaluation for details): ADL's;IADL's;Rest and Sleep;Work;Leisure    Body Structure / Function / Physical Skills ADL;UE functional use;Pain;ROM;Edema;Strength;IADL    Rehab Potential Good    Clinical Decision Making Limited treatment options, no task modification necessary    Comorbidities Affecting Occupational Performance: None    Modification or Assistance to Complete Evaluation  No modification of tasks or assist necessary to complete eval    OT Frequency One time visit    OT Treatment/Interventions Splinting;Patient/family education    Plan P: pt provided with custom fabricated splint, instructed to call office if needs adjustment. Pt verbalized understanding. No further OT follow up needs at this time.    OT Home Exercise Plan splint wear and care    Consulted and Agree with Plan of Care Patient           Patient will benefit from skilled therapeutic intervention in order to improve the following deficits and impairments:   Body Structure / Function / Physical Skills: ADL, UE functional use, Pain, ROM, Edema, Strength, IADL       Visit Diagnosis: Pain in right hand  Other symptoms and signs involving the musculoskeletal system    Problem List Patient Active Problem List   Diagnosis Date Noted  . Eosinophilic pneumonia 15/17/6160  . Chronic  respiratory failure with hypoxia ([place]) 02/16/2018  . ILD (interstitial lung disease) ([place])   . Hypogonadism male 09/12/2014  . Uncontrolled type 1 diabetes mellitus ([place]) 02/18/2014  . Cholelithiasis 06/06/2013  . Bipolar 1 disorder, mixed, moderate ([place]) 06/04/2013  . Abnormal LFTs 06/03/2013  .  Pancreatitis, acute 06/03/2013  . DKA (diabetic ketoacidosis) ([place]) 06/03/2013  . DKA (diabetic ketoacidoses) [facility]) 06/03/2013   Guadelupe Sabin, OTR/L  [phone] 11/11/2019, 10:24 AM  [place] [address] [place], [place], [address] Phone: [phone]   Fax:  [phone]  Name: RANDOM DOBROWSKI MRN: [id number] Date of Birth: 01/15/1976

## 2019-12-07 DIAGNOSIS — S62350A Nondisplaced fracture of shaft of second metacarpal bone, right hand, initial encounter for closed fracture: Secondary | ICD-10-CM | POA: Insufficient documentation

## 2019-12-08 ENCOUNTER — Ambulatory Visit (INDEPENDENT_AMBULATORY_CARE_PROVIDER_SITE_OTHER): Payer: Medicaid Other | Admitting: Orthopedic Surgery

## 2019-12-08 ENCOUNTER — Other Ambulatory Visit: Payer: Self-pay

## 2019-12-08 ENCOUNTER — Encounter: Payer: Self-pay | Admitting: Orthopedic Surgery

## 2019-12-08 ENCOUNTER — Ambulatory Visit: Payer: Medicaid Other

## 2019-12-08 VITALS — BP 150/84 | HR 108 | Ht 73.0 in | Wt 237.0 lb

## 2019-12-08 DIAGNOSIS — S62350D Nondisplaced fracture of shaft of second metacarpal bone, right hand, subsequent encounter for fracture with routine healing: Secondary | ICD-10-CM

## 2019-12-08 NOTE — Progress Notes (Signed)
Chief Complaint  Patient presents with  . Hand Injury    fractured right hand 4 wk follow up    44 year old male status post right hand injury with second metacarpal fracture treated with a functional splint  Presents for x-ray  Pain is well controlled  X-ray shows no change in the angulation of the fracture there is been interval healing  Recommend continue functional splint for 2 weeks and repeat x-ray at that time with a plan to get him out of the splint and start active range of motion Encounter Diagnosis  Name Primary?  . Closed nondisplaced fracture of shaft of second metacarpal bone of right hand with routine healing, subsequent encounter 11/07/19 Yes

## 2019-12-19 ENCOUNTER — Other Ambulatory Visit: Payer: Self-pay | Admitting: Endocrinology

## 2019-12-19 DIAGNOSIS — E1065 Type 1 diabetes mellitus with hyperglycemia: Secondary | ICD-10-CM

## 2019-12-26 ENCOUNTER — Inpatient Hospital Stay (HOSPITAL_COMMUNITY)
Admission: EM | Admit: 2019-12-26 | Discharge: 2019-12-28 | DRG: 439 | Disposition: A | Discharge status: Home / Self Care | Payer: Medicaid Other | Attending: Family Medicine | Admitting: Family Medicine

## 2019-12-26 ENCOUNTER — Emergency Department (HOSPITAL_COMMUNITY): Payer: Medicaid Other

## 2019-12-26 ENCOUNTER — Encounter (HOSPITAL_COMMUNITY): Payer: Self-pay | Admitting: Emergency Medicine

## 2019-12-26 ENCOUNTER — Other Ambulatory Visit: Payer: Self-pay

## 2019-12-26 ENCOUNTER — Telehealth: Payer: Self-pay | Admitting: Orthopedic Surgery

## 2019-12-26 ENCOUNTER — Ambulatory Visit: Payer: Medicaid Other | Admitting: Orthopedic Surgery

## 2019-12-26 DIAGNOSIS — E785 Hyperlipidemia, unspecified: Secondary | ICD-10-CM | POA: Diagnosis present

## 2019-12-26 DIAGNOSIS — E860 Dehydration: Secondary | ICD-10-CM | POA: Diagnosis present

## 2019-12-26 DIAGNOSIS — F17210 Nicotine dependence, cigarettes, uncomplicated: Secondary | ICD-10-CM | POA: Diagnosis present

## 2019-12-26 DIAGNOSIS — I4892 Unspecified atrial flutter: Secondary | ICD-10-CM | POA: Diagnosis present

## 2019-12-26 DIAGNOSIS — K859 Acute pancreatitis without necrosis or infection, unspecified: Secondary | ICD-10-CM | POA: Diagnosis present

## 2019-12-26 DIAGNOSIS — IMO0002 Reserved for concepts with insufficient information to code with codable children: Secondary | ICD-10-CM | POA: Diagnosis present

## 2019-12-26 DIAGNOSIS — Z79899 Other long term (current) drug therapy: Secondary | ICD-10-CM

## 2019-12-26 DIAGNOSIS — Z9981 Dependence on supplemental oxygen: Secondary | ICD-10-CM | POA: Diagnosis not present

## 2019-12-26 DIAGNOSIS — Z6831 Body mass index (BMI) 31.0-31.9, adult: Secondary | ICD-10-CM

## 2019-12-26 DIAGNOSIS — J8289 Other pulmonary eosinophilia, not elsewhere classified: Secondary | ICD-10-CM | POA: Diagnosis present

## 2019-12-26 DIAGNOSIS — N179 Acute kidney failure, unspecified: Secondary | ICD-10-CM | POA: Diagnosis present

## 2019-12-26 DIAGNOSIS — Z20822 Contact with and (suspected) exposure to covid-19: Secondary | ICD-10-CM | POA: Diagnosis present

## 2019-12-26 DIAGNOSIS — J849 Interstitial pulmonary disease, unspecified: Secondary | ICD-10-CM | POA: Diagnosis present

## 2019-12-26 DIAGNOSIS — I2510 Atherosclerotic heart disease of native coronary artery without angina pectoris: Secondary | ICD-10-CM | POA: Diagnosis present

## 2019-12-26 DIAGNOSIS — E782 Mixed hyperlipidemia: Secondary | ICD-10-CM | POA: Diagnosis present

## 2019-12-26 DIAGNOSIS — K760 Fatty (change of) liver, not elsewhere classified: Secondary | ICD-10-CM | POA: Diagnosis present

## 2019-12-26 DIAGNOSIS — I493 Ventricular premature depolarization: Secondary | ICD-10-CM | POA: Diagnosis present

## 2019-12-26 DIAGNOSIS — E871 Hypo-osmolality and hyponatremia: Secondary | ICD-10-CM | POA: Diagnosis present

## 2019-12-26 DIAGNOSIS — I700 Atherosclerosis of aorta: Secondary | ICD-10-CM | POA: Diagnosis present

## 2019-12-26 DIAGNOSIS — F3162 Bipolar disorder, current episode mixed, moderate: Secondary | ICD-10-CM | POA: Diagnosis present

## 2019-12-26 DIAGNOSIS — K219 Gastro-esophageal reflux disease without esophagitis: Secondary | ICD-10-CM | POA: Diagnosis present

## 2019-12-26 DIAGNOSIS — E1065 Type 1 diabetes mellitus with hyperglycemia: Secondary | ICD-10-CM | POA: Diagnosis present

## 2019-12-26 DIAGNOSIS — I10 Essential (primary) hypertension: Secondary | ICD-10-CM | POA: Diagnosis present

## 2019-12-26 DIAGNOSIS — E669 Obesity, unspecified: Secondary | ICD-10-CM | POA: Diagnosis present

## 2019-12-26 DIAGNOSIS — F17200 Nicotine dependence, unspecified, uncomplicated: Secondary | ICD-10-CM | POA: Diagnosis present

## 2019-12-26 DIAGNOSIS — I4891 Unspecified atrial fibrillation: Secondary | ICD-10-CM | POA: Diagnosis present

## 2019-12-26 DIAGNOSIS — Z794 Long term (current) use of insulin: Secondary | ICD-10-CM

## 2019-12-26 DIAGNOSIS — Z8249 Family history of ischemic heart disease and other diseases of the circulatory system: Secondary | ICD-10-CM

## 2019-12-26 DIAGNOSIS — Z7951 Long term (current) use of inhaled steroids: Secondary | ICD-10-CM

## 2019-12-26 DIAGNOSIS — J9611 Chronic respiratory failure with hypoxia: Secondary | ICD-10-CM | POA: Diagnosis present

## 2019-12-26 DIAGNOSIS — I959 Hypotension, unspecified: Secondary | ICD-10-CM | POA: Diagnosis present

## 2019-12-26 DIAGNOSIS — T466X5A Adverse effect of antihyperlipidemic and antiarteriosclerotic drugs, initial encounter: Secondary | ICD-10-CM | POA: Diagnosis present

## 2019-12-26 DIAGNOSIS — K8500 Idiopathic acute pancreatitis without necrosis or infection: Secondary | ICD-10-CM | POA: Diagnosis present

## 2019-12-26 DIAGNOSIS — R739 Hyperglycemia, unspecified: Secondary | ICD-10-CM

## 2019-12-26 DIAGNOSIS — Z833 Family history of diabetes mellitus: Secondary | ICD-10-CM

## 2019-12-26 DIAGNOSIS — Z8701 Personal history of pneumonia (recurrent): Secondary | ICD-10-CM

## 2019-12-26 HISTORY — DX: Tobacco use: Z72.0

## 2019-12-26 HISTORY — DX: Obesity, unspecified: E66.9

## 2019-12-26 HISTORY — DX: Chronic respiratory failure, unspecified whether with hypoxia or hypercapnia: J96.10

## 2019-12-26 HISTORY — DX: Other pulmonary eosinophilia, not elsewhere classified: J82.89

## 2019-12-26 HISTORY — DX: Tachycardia, unspecified: R00.0

## 2019-12-26 LAB — URINALYSIS, ROUTINE W REFLEX MICROSCOPIC
Bilirubin Urine: NEGATIVE
Glucose, UA: >=500 mg/dL — AB
Ketones, ur: 5 mg/dL — AB
Leukocytes,Ua: NEGATIVE
Nitrite: NEGATIVE
Protein, ur: 30 mg/dL — AB
Specific Gravity, Urine: 1.029 (ref 1.005–1.030)
pH: 5 (ref 5.0–8.0)

## 2019-12-26 LAB — CBC WITH DIFFERENTIAL/PLATELET
Abs Immature Granulocytes: 0.17 10*3/uL — ABNORMAL HIGH (ref 0.00–0.07)
Basophils Absolute: 0.1 10*3/uL (ref 0.0–0.1)
Basophils Relative: 0 %
Eosinophils Absolute: 0 10*3/uL (ref 0.0–0.5)
Eosinophils Relative: 0 %
HCT: 45.1 % (ref 39.0–52.0)
Hemoglobin: 14.9 g/dL (ref 13.0–17.0)
Immature Granulocytes: 1 %
Lymphocytes Relative: 7 %
Lymphs Abs: 1.4 10*3/uL (ref 0.7–4.0)
MCH: 29.4 pg (ref 26.0–34.0)
MCHC: 33 g/dL (ref 30.0–36.0)
MCV: 89.1 fL (ref 80.0–100.0)
Monocytes Absolute: 1.5 10*3/uL — ABNORMAL HIGH (ref 0.1–1.0)
Monocytes Relative: 7 %
Neutro Abs: 17.3 10*3/uL — ABNORMAL HIGH (ref 1.7–7.7)
Neutrophils Relative %: 85 %
Platelets: 295 10*3/uL (ref 150–400)
RBC: 5.06 MIL/uL (ref 4.22–5.81)
RDW: 14.7 % (ref 11.5–15.5)
WBC: 20.4 10*3/uL — ABNORMAL HIGH (ref 4.0–10.5)
nRBC: 0 % (ref 0.0–0.2)

## 2019-12-26 LAB — COMPREHENSIVE METABOLIC PANEL
ALT: 16 U/L (ref 0–44)
AST: 16 U/L (ref 15–41)
Albumin: 2.5 g/dL — ABNORMAL LOW (ref 3.5–5.0)
Alkaline Phosphatase: 138 U/L — ABNORMAL HIGH (ref 38–126)
Anion gap: 15 (ref 5–15)
BUN: 63 mg/dL — ABNORMAL HIGH (ref 6–20)
CO2: 24 mmol/L (ref 22–32)
Calcium: 8.8 mg/dL — ABNORMAL LOW (ref 8.9–10.3)
Chloride: 85 mmol/L — ABNORMAL LOW (ref 98–111)
Creatinine, Ser: 2.29 mg/dL — ABNORMAL HIGH (ref 0.61–1.24)
GFR calc Af Amer: 39 mL/min — ABNORMAL LOW (ref >60)
GFR calc non Af Amer: 34 mL/min — ABNORMAL LOW (ref >60)
Glucose, Bld: 530 mg/dL (ref 70–99)
Potassium: 5.1 mmol/L (ref 3.5–5.1)
Sodium: 124 mmol/L — ABNORMAL LOW (ref 135–145)
Total Bilirubin: 0.6 mg/dL (ref 0.3–1.2)
Total Protein: 8 g/dL (ref 6.5–8.1)

## 2019-12-26 LAB — GLUCOSE, CAPILLARY: Glucose-Capillary: 267 mg/dL — ABNORMAL HIGH (ref 70–99)

## 2019-12-26 LAB — HEPARIN LEVEL (UNFRACTIONATED): Heparin Unfractionated: <0.1 IU/mL — ABNORMAL LOW (ref 0.30–0.70)

## 2019-12-26 LAB — CBG MONITORING, ED
Glucose-Capillary: 485 mg/dL — ABNORMAL HIGH (ref 70–99)
Glucose-Capillary: 471 mg/dL — ABNORMAL HIGH (ref 70–99)
Glucose-Capillary: 395 mg/dL — ABNORMAL HIGH (ref 70–99)
Glucose-Capillary: 320 mg/dL — ABNORMAL HIGH (ref 70–99)
Glucose-Capillary: 68 mg/dL — ABNORMAL LOW (ref 70–99)
Glucose-Capillary: 62 mg/dL — ABNORMAL LOW (ref 70–99)
Glucose-Capillary: 63 mg/dL — ABNORMAL LOW (ref 70–99)
Glucose-Capillary: 202 mg/dL — ABNORMAL HIGH (ref 70–99)
Glucose-Capillary: 278 mg/dL — ABNORMAL HIGH (ref 70–99)

## 2019-12-26 LAB — LACTIC ACID, PLASMA
Lactic Acid, Venous: 2 mmol/L (ref 0.5–1.9)
Lactic Acid, Venous: 1.4 mmol/L (ref 0.5–1.9)

## 2019-12-26 LAB — APTT: aPTT: 32 seconds (ref 24–36)

## 2019-12-26 LAB — HIV ANTIBODY (ROUTINE TESTING W REFLEX): HIV Screen 4th Generation wRfx: NONREACTIVE

## 2019-12-26 LAB — LIPASE, BLOOD: Lipase: 47 U/L (ref 11–51)

## 2019-12-26 LAB — POC OCCULT BLOOD, ED: Fecal Occult Bld: NEGATIVE

## 2019-12-26 LAB — VALPROIC ACID LEVEL: Valproic Acid Lvl: 48 ug/mL — ABNORMAL LOW (ref 50.0–100.0)

## 2019-12-26 LAB — SARS CORONAVIRUS 2 BY RT PCR (HOSPITAL ORDER, PERFORMED IN ~~LOC~~ HOSPITAL LAB): SARS Coronavirus 2: NEGATIVE

## 2019-12-26 LAB — TSH: TSH: 1.111 u[IU]/mL (ref 0.350–4.500)

## 2019-12-26 MED ORDER — HEPARIN (PORCINE) 25000 UT/250ML-% IV SOLN
2000.0000 [IU]/h | INTRAVENOUS | Status: DC
  Administered 2019-12-27: 1700 [IU]/h via INTRAVENOUS
  Filled 2019-12-26: qty 250

## 2019-12-26 MED ORDER — DILTIAZEM HCL-DEXTROSE 125-5 MG/125ML-% IV SOLN (PREMIX)
5.0000 mg/h | INTRAVENOUS | Status: DC
  Administered 2019-12-26: 5 mg/h via INTRAVENOUS
  Administered 2019-12-26 – 2019-12-27 (×3): 15 mg/h via INTRAVENOUS
  Filled 2019-12-26 (×4): qty 125

## 2019-12-26 MED ORDER — HYDROCODONE-ACETAMINOPHEN 5-325 MG PO TABS
1.0000 | ORAL_TABLET | ORAL | Status: DC | PRN
  Administered 2019-12-26 (×2): 2 via ORAL
  Administered 2019-12-27 (×2): 1 via ORAL
  Filled 2019-12-26: qty 2
  Filled 2019-12-26: qty 1
  Filled 2019-12-26: qty 2
  Filled 2019-12-26: qty 1

## 2019-12-26 MED ORDER — ACETAMINOPHEN 325 MG PO TABS: 650.0000 mg | ORAL_TABLET | Freq: Four times a day (QID) | ORAL | Status: DC | PRN

## 2019-12-26 MED ORDER — DARIFENACIN HYDROBROMIDE ER 7.5 MG PO TB24
7.5000 mg | ORAL_TABLET | Freq: Every day | ORAL | Status: DC
  Administered 2019-12-26 – 2019-12-27 (×2): 7.5 mg via ORAL

## 2019-12-26 MED ORDER — METOPROLOL TARTRATE 5 MG/5ML IV SOLN: 2.5000 mg | Freq: Four times a day (QID) | INTRAVENOUS | Status: DC

## 2019-12-26 MED ORDER — INSULIN ASPART 100 UNIT/ML ~~LOC~~ SOLN
0.0000 [IU] | Freq: Three times a day (TID) | SUBCUTANEOUS | Status: DC
  Administered 2019-12-26: 11 [IU] via SUBCUTANEOUS
  Administered 2019-12-27: 2 [IU] via SUBCUTANEOUS
  Administered 2019-12-27 – 2019-12-28 (×4): 3 [IU] via SUBCUTANEOUS
  Filled 2019-12-26: qty 1

## 2019-12-26 MED ORDER — CHLORHEXIDINE GLUCONATE CLOTH 2 % EX PADS
6.0000 | MEDICATED_PAD | Freq: Every day | CUTANEOUS | Status: DC
  Administered 2019-12-27 – 2019-12-28 (×2): 6 via TOPICAL

## 2019-12-26 MED ORDER — METOPROLOL TARTRATE 25 MG PO TABS
12.5000 mg | ORAL_TABLET | Freq: Two times a day (BID) | ORAL | Status: DC
  Filled 2019-12-26: qty 1

## 2019-12-26 MED ORDER — HEPARIN (PORCINE) 25000 UT/250ML-% IV SOLN
1300.0000 [IU]/h | INTRAVENOUS | Status: DC
  Administered 2019-12-26: 1300 [IU]/h via INTRAVENOUS
  Filled 2019-12-26: qty 250

## 2019-12-26 MED ORDER — INSULIN ASPART 100 UNIT/ML ~~LOC~~ SOLN
0.0000 [IU] | Freq: Every day | SUBCUTANEOUS | Status: DC
  Administered 2019-12-26: 3 [IU] via SUBCUTANEOUS

## 2019-12-26 MED ORDER — SIMVASTATIN 10 MG PO TABS: 20.0000 mg | ORAL_TABLET | Freq: Every day | ORAL | Status: DC

## 2019-12-26 MED ORDER — LEVALBUTEROL HCL 0.63 MG/3ML IN NEBU
0.6300 mg | INHALATION_SOLUTION | Freq: Four times a day (QID) | RESPIRATORY_TRACT | Status: DC | PRN
  Administered 2019-12-26 – 2019-12-27 (×2): 0.63 mg via RESPIRATORY_TRACT
  Filled 2019-12-26 (×3): qty 3

## 2019-12-26 MED ORDER — LACTATED RINGERS IV BOLUS
1000.0000 mL | INTRAVENOUS | Status: AC
  Administered 2019-12-26 (×2): 1000 mL via INTRAVENOUS

## 2019-12-26 MED ORDER — ONDANSETRON HCL 4 MG PO TABS: 4.0000 mg | ORAL_TABLET | Freq: Four times a day (QID) | ORAL | Status: DC | PRN

## 2019-12-26 MED ORDER — HEPARIN BOLUS VIA INFUSION
3000.0000 [IU] | Freq: Once | INTRAVENOUS | Status: AC
  Administered 2019-12-26: 3000 [IU] via INTRAVENOUS

## 2019-12-26 MED ORDER — DIVALPROEX SODIUM ER 500 MG PO TB24
1000.0000 mg | ORAL_TABLET | Freq: Every day | ORAL | Status: DC
  Administered 2019-12-26 – 2019-12-27 (×2): 1000 mg via ORAL
  Filled 2019-12-26 (×2): qty 2

## 2019-12-26 MED ORDER — INSULIN ASPART 100 UNIT/ML ~~LOC~~ SOLN
20.0000 [IU] | Freq: Once | SUBCUTANEOUS | Status: AC
  Administered 2019-12-26: 20 [IU] via SUBCUTANEOUS
  Filled 2019-12-26: qty 1

## 2019-12-26 MED ORDER — PANTOPRAZOLE SODIUM 40 MG PO TBEC
40.0000 mg | DELAYED_RELEASE_TABLET | Freq: Every day | ORAL | Status: DC
  Administered 2019-12-26 – 2019-12-27 (×2): 40 mg via ORAL
  Filled 2019-12-26 (×2): qty 1

## 2019-12-26 MED ORDER — LACTATED RINGERS IV SOLN: INTRAVENOUS | Status: DC

## 2019-12-26 MED ORDER — ONDANSETRON HCL 4 MG/2ML IJ SOLN
4.0000 mg | Freq: Once | INTRAMUSCULAR | Status: AC
  Administered 2019-12-26: 4 mg via INTRAVENOUS
  Filled 2019-12-26: qty 2

## 2019-12-26 MED ORDER — INSULIN GLARGINE 100 UNIT/ML ~~LOC~~ SOLN
40.0000 [IU] | Freq: Every day | SUBCUTANEOUS | Status: DC
  Administered 2019-12-26 – 2019-12-28 (×3): 40 [IU] via SUBCUTANEOUS
  Filled 2019-12-26 (×5): qty 0.4

## 2019-12-26 MED ORDER — LACTATED RINGERS IV BOLUS
1000.0000 mL | Freq: Once | INTRAVENOUS | Status: AC
  Administered 2019-12-26: 1000 mL via INTRAVENOUS

## 2019-12-26 MED ORDER — FENTANYL CITRATE (PF) 100 MCG/2ML IJ SOLN
100.0000 ug | Freq: Once | INTRAMUSCULAR | Status: AC
  Administered 2019-12-26: 100 ug via INTRAVENOUS
  Filled 2019-12-26: qty 2

## 2019-12-26 MED ORDER — METOPROLOL TARTRATE 5 MG/5ML IV SOLN
5.0000 mg | Freq: Four times a day (QID) | INTRAVENOUS | Status: DC
  Administered 2019-12-26 – 2019-12-27 (×3): 5 mg via INTRAVENOUS
  Filled 2019-12-26 (×3): qty 5

## 2019-12-26 MED ORDER — DILTIAZEM HCL 25 MG/5ML IV SOLN
10.0000 mg | Freq: Once | INTRAVENOUS | Status: AC
  Administered 2019-12-26: 10 mg via INTRAVENOUS
  Filled 2019-12-26: qty 5

## 2019-12-26 MED ORDER — IOHEXOL 9 MG/ML PO SOLN
ORAL | Status: AC
  Filled 2019-12-26: qty 1000

## 2019-12-26 MED ORDER — DEXTROSE 50 % IV SOLN
1.0000 | Freq: Once | INTRAVENOUS | Status: AC
  Administered 2019-12-26: 50 mL via INTRAVENOUS
  Filled 2019-12-26: qty 50

## 2019-12-26 MED ORDER — ONDANSETRON HCL 4 MG/2ML IJ SOLN: 4.0000 mg | Freq: Four times a day (QID) | INTRAMUSCULAR | Status: DC | PRN

## 2019-12-26 MED ORDER — ACETAMINOPHEN 650 MG RE SUPP: 650.0000 mg | Freq: Four times a day (QID) | RECTAL | Status: DC | PRN

## 2019-12-26 MED ORDER — RISPERIDONE 1 MG PO TABS
4.0000 mg | ORAL_TABLET | Freq: Every day | ORAL | Status: DC
  Administered 2019-12-26 – 2019-12-27 (×2): 4 mg via ORAL
  Filled 2019-12-26 (×2): qty 4

## 2019-12-26 MED ORDER — SODIUM CHLORIDE 0.9 % IV BOLUS (SEPSIS)
1000.0000 mL | Freq: Once | INTRAVENOUS | Status: AC
  Administered 2019-12-26: 1000 mL via INTRAVENOUS

## 2019-12-26 MED ORDER — METOPROLOL TARTRATE 5 MG/5ML IV SOLN
2.5000 mg | Freq: Four times a day (QID) | INTRAVENOUS | Status: DC
  Administered 2019-12-26: 2.5 mg via INTRAVENOUS
  Filled 2019-12-26: qty 5

## 2019-12-26 MED ORDER — HEPARIN BOLUS VIA INFUSION
4000.0000 [IU] | Freq: Once | INTRAVENOUS | Status: AC
  Administered 2019-12-26: 4000 [IU] via INTRAVENOUS

## 2019-12-26 MED ORDER — BUDESONIDE 0.25 MG/2ML IN SUSP
0.2500 mg | Freq: Two times a day (BID) | RESPIRATORY_TRACT | Status: DC
  Administered 2019-12-26 – 2019-12-28 (×5): 0.25 mg via RESPIRATORY_TRACT
  Filled 2019-12-26 (×5): qty 2

## 2019-12-26 MED ORDER — NICOTINE 21 MG/24HR TD PT24
21.0000 mg | MEDICATED_PATCH | Freq: Every day | TRANSDERMAL | Status: DC
  Administered 2019-12-26 – 2019-12-28 (×3): 21 mg via TRANSDERMAL
  Filled 2019-12-26 (×3): qty 1

## 2019-12-26 NOTE — ED Notes (Signed)
Pt in bed, pt eating lunch, resps even and unlabored

## 2019-12-26 NOTE — ED Notes (Signed)
Pts primary contact Alan Mulder [phone]) requesting updates on Pt status.

## 2019-12-26 NOTE — ED Triage Notes (Signed)
Pt with c/o abdominal pain since Thursday morning that he describes as cramping in nature. Pt states he has not eaten anything since Wednesday and states he feels weak and dizzy. States he is more dizzy when standing but is also dizzy when sitting. Pt states he is diabetic but has not checked his sugar since Wednesday nor taken his insulin.

## 2019-12-26 NOTE — ED Notes (Signed)
Date and time results received: 12/26/19 0502  Test: lactic Critical Value: 2.0  Name of Provider Notified: Christy Gentles, RN  Orders Received? Or Actions Taken?: acknowleged

## 2019-12-26 NOTE — ED Provider Notes (Signed)
[facility] Provider Note   CSN: [id number] Arrival date & time: 12/26/19  0353     History Chief Complaint  Patient presents with  . Abdominal Pain    [name] is a 44 y.o. male.  The history is provided by the patient.  Abdominal Pain Pain location:  Generalized Pain quality: cramping   Pain radiates to:  Does not radiate Pain severity:  Moderate Onset quality:  Gradual Duration:  3 days Timing:  Constant Progression:  Worsening Chronicity:  New Relieved by:  Nothing Worsened by:  Movement and palpation Associated symptoms: fatigue, shortness of breath and vomiting   Associated symptoms: no chest pain   Patient with history of diabetes, hypertension, bipolar presents with abdominal pain and dizziness.  Patient reports of the past 3 days he has had worsening of abdominal pain.  He also reports feeling lightheaded upon standing. He has had to use p.o. He reports recent black stool.  No diarrhea.   He is on oxygen daily for his interstitial lung disease.      Past Medical History:  Diagnosis Date  . Bipolar disorder ([place])   . Erectile dysfunction   . Essential hypertension   . GERD (gastroesophageal reflux disease)   . History of pneumonia   . Hyperlipidemia   . Interstitial lung disease (HCC)    Pulmonary eosinophilia  . Type 2 diabetes mellitus [facility])     Patient Active Problem List   Diagnosis Date Noted  . Closed nondisplaced fracture of shaft of second metacarpal bone of right hand 12/07/2019  . Eosinophilic pneumonia 52/77/8242  . Chronic respiratory failure with hypoxia ([place]) 02/16/2018  . ILD (interstitial lung disease) ([place])   . Hypogonadism male 09/12/2014  . Uncontrolled type 1 diabetes mellitus ([place]) 02/18/2014  . Cholelithiasis 06/06/2013  . Bipolar 1 disorder, mixed, moderate ([place]) 06/04/2013  . Abnormal LFTs 06/03/2013  . Pancreatitis, acute 06/03/2013  . DKA (diabetic ketoacidosis) ([place]) 06/03/2013  . DKA  (diabetic ketoacidoses) ([place]) 06/03/2013    Past Surgical History:  Procedure Laterality Date  . APPENDECTOMY    . CHOLECYSTECTOMY N/A 07/11/2013   Procedure: LAPAROSCOPIC CHOLECYSTECTOMY;  Surgeon: Jamesetta So, MD;  Location: [facility];  Service: General;  Laterality: N/A;  . INCISION AND DRAINAGE ABSCESS / HEMATOMA OF BURSA / KNEE / THIGH    . KNEE SURGERY Bilateral   . VIDEO BRONCHOSCOPY Bilateral 02/09/2018   Procedure: VIDEO BRONCHOSCOPY WITHOUT FLUORO;  Surgeon: Brand Males, MD;  Location[facility];  Service: Cardiopulmonary;  Laterality: Bilateral;       Family History  Problem Relation Age of Onset  . Diabetes Mother   . Diabetes Maternal Grandmother   . Pancreatitis Neg Hx   . Colon cancer Neg Hx     Social History   Tobacco Use  . Smoking status: Current Some Day Smoker    Packs/day: 0.50    Years: 10.00    Pack years: 5.00    Types: Cigarettes  . Smokeless tobacco: Never Used  Vaping Use  . Vaping Use: Never used  Substance Use Topics  . Alcohol use: No  . Drug use: No    Home Medications Prior to Admission medications   Medication Sig Start Date End Date Taking? Authorizing Provider  albuterol (VENTOLIN HFA) 108 (90 Base) MCG/ACT inhaler Inhale 2 puffs into the lungs every 6 (six) hours as needed.   Yes [provider]  amLODipine (NORVASC) 5 MG tablet Take 5 mg by mouth daily. 11/30/19  Yes [provider]  divalproex (DEPAKOTE) 500 MG DR tablet Take 1,000 mg by mouth at bedtime.   Yes [provider]  furosemide (LASIX) 40 MG tablet Take 40 mg by mouth as needed.    Yes [provider]  insulin NPH Human (HUMULIN N) 100 UNIT/ML injection Inject 2.2 mLs (220 Units total) into the skin every morning. And syringes 3/day 11/01/19  Yes Renato Shin, MD  lisinopril (ZESTRIL) 20 MG tablet TAKE ONE TABLET BY MOUTH ONCE DAILY. 07/11/19  Yes Corum, Rex Kras, MD  metoprolol tartrate (LOPRESSOR) 25 MG tablet Take 12.5 mg by mouth  2 (two) times daily.    Yes [provider]  omeprazole (PRILOSEC) 20 MG capsule Take 20 mg by mouth at bedtime.   Yes [provider]  risperidone (RISPERDAL) 4 MG tablet Take 4 mg by mouth at bedtime.    Yes [provider]  simvastatin (ZOCOR) 20 MG tablet Take 20 mg by mouth at bedtime.   Yes [provider]  solifenacin (VESICARE) 5 MG tablet Take 5 mg by mouth at bedtime.    Yes [provider]  SYMBICORT 160-4.5 MCG/ACT inhaler Inhale 2 puffs into the lungs 2 (two) times daily. 03/17/19  Yes [provider]  glucose blood (ONETOUCH VERIO) test strip 1 each by Other route 2 (two) times daily. And lancets 2/day 03/08/18   Renato Shin, MD  nicotine (NICODERM CQ - DOSED IN MG/24 HOURS) 21 mg/24hr patch Place 21 mg onto the skin daily. 04/06/19   [provider]  sildenafil (VIAGRA) 100 MG tablet Take 0.5-1 tablets (50-100 mg total) daily as needed by mouth for erectile dysfunction. 04/08/17   Renato Shin, MD  SURE COMFORT INSULIN SYRINGE 30G X 1/2" 1 ML MISC USE AS DIRECTED PER PROVIDER TO INJECT INSULIN DAILY. 12/19/19   Renato Shin, MD    Allergies    Patient has no known allergies.  Review of Systems   Review of Systems  Constitutional: Positive for fatigue.  Respiratory: Positive for shortness of breath.   Cardiovascular: Negative for chest pain.  Gastrointestinal: Positive for abdominal pain and vomiting.  All other systems reviewed and are negative.   Physical Exam Updated Vital Signs BP (!) 112/88   Pulse 91   Temp 98.4 F (36.9 C) (Oral)   Resp 21   Ht 1.854 m (6\' 1" )   Wt (!) 107 kg   SpO2 98%   BMI 31.12 kg/m   Physical Exam CONSTITUTIONAL: Chronically ill-appearing HEAD: Normocephalic/atraumatic EYES: EOMI/PERRL ENMT: Mucous membranes dry NECK: supple no meningeal signs SPINE/BACK:entire spine nontender CV: S1/S2 noted, no murmurs/rubs/gallops noted LUNGS: Fine crackles bilaterally, patient is  on oxygen, no apparent distress ABDOMEN: soft, moderate diffuse tenderness, no rebound or guarding, bowel sounds noted throughout abdomen GU:no cva tenderness Rectal-no blood or melena, minimal stool noted.  Nurse present for exam NEURO: Pt is awake/alert/appropriate, moves all extremitiesx4.  No facial droop.   EXTREMITIES: pulses normal/equal, full ROM SKIN: warm, color normal PSYCH: no abnormalities of mood noted, alert and oriented to situation  ED Results / Procedures / Treatments   Labs (all labs ordered are listed, but only abnormal results are displayed) Labs Reviewed  COMPREHENSIVE METABOLIC PANEL - Abnormal; Notable for the following components:      Result Value   Sodium 124 (*)    Chloride 85 (*)    Glucose, Bld 530 (*)    BUN 63 (*)    Creatinine, Ser 2.29 (*)  Calcium 8.8 (*)    Albumin 2.5 (*)    Alkaline Phosphatase 138 (*)    GFR calc non Af Amer 34 (*)    GFR calc Af Amer 39 (*)    All other components within normal limits  CBC WITH DIFFERENTIAL/PLATELET - Abnormal; Notable for the following components:   WBC 20.4 (*)    Neutro Abs 17.3 (*)    Monocytes Absolute 1.5 (*)    Abs Immature Granulocytes 0.17 (*)    All other components within normal limits  URINALYSIS, ROUTINE W REFLEX MICROSCOPIC - Abnormal; Notable for the following components:   Glucose, UA >=500 (*)    Hgb urine dipstick SMALL (*)    Ketones, ur 5 (*)    Protein, ur 30 (*)    Bacteria, UA RARE (*)    All other components within normal limits  VALPROIC ACID LEVEL - Abnormal; Notable for the following components:   Valproic Acid Lvl 48 (*)    All other components within normal limits  LACTIC ACID, PLASMA - Abnormal; Notable for the following components:   Lactic Acid, Venous 2.0 (*)    All other components within normal limits  CBG MONITORING, ED - Abnormal; Notable for the following components:   Glucose-Capillary 485 (*)    All other components within normal limits  CULTURE, BLOOD  (ROUTINE X 2)  CULTURE, BLOOD (ROUTINE X 2)  SARS CORONAVIRUS 2 BY RT PCR (HOSPITAL ORDER, [facility])  LIPASE, BLOOD  LACTIC ACID, PLASMA  POC OCCULT BLOOD, ED    EKG EKG Interpretation  Date/Time:  Monday December 26 2019 04:38:33 EDT Ventricular Rate:  180 PR Interval:    QRS Duration: 81 QT Interval:  250 QTC Calculation: 433 R Axis:   90 Text Interpretation: Supraventricular tachycardia Ventricular premature complex Borderline right axis deviation Confirmed by Ripley Fraise [phone]) on 12/26/2019 4:54:58 AM   Radiology CT ABDOMEN PELVIS WO CONTRAST  Result Date: 12/26/2019 CLINICAL DATA:  Acute nonlocalized abdominal pain EXAM: CT ABDOMEN AND PELVIS WITHOUT CONTRAST TECHNIQUE: Multidetector CT imaging of the abdomen and pelvis was performed following the standard protocol without IV contrast. COMPARISON:  05/29/2013 FINDINGS: Lower chest: Bilateral pulmonary reticulation that is chronic when compared with the 2019 chest CT. 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 Premature coronary atherosclerotic calcification. Hepatobiliary: Marked fatty infiltration of the liver.Cholecystectomy in the interim. Pancreas: Peripancreatic edema with diffuse pancreatic indistinctness and expansion. Spleen: Unremarkable. Adrenals/Urinary Tract: Negative adrenals. No hydronephrosis or stone. Unremarkable bladder. Stomach/Bowel:  No obstruction. No visible bowel inflammation. Vascular/Lymphatic: No acute vascular abnormality. Diffuse atherosclerotic calcification of the aorta and iliacs. No mass or adenopathy. Reproductive:Negative. Other: Small volume ascites. Musculoskeletal: No acute abnormalities. IMPRESSION: 1. Acute pancreatitis. 2. Hepatic steatosis. 3. Premature atherosclerotic calcification of the aorta and coronaries. 4. Chronic lung disease with history of the eosinophilic pneumonia. Electronically Signed   By: Monte Fantasia M.D.   On: 12/26/2019 06:58   DG Chest  Port 1 View  Result Date: 12/26/2019 CLINICAL DATA:  Shortness of breath. Abdominal pain since Thursday which is cramping EXAM: PORTABLE CHEST 1 VIEW COMPARISON:  01/04/2019 FINDINGS: Chronic lung disease with patchy bilateral pulmonary opacity that is unchanged. Normal heart size and mediastinal contours. No visible effusion or pneumothorax. IMPRESSION: Chronic lung disease without acute superimposed finding when compared with prior. Electronically Signed   By: Monte Fantasia M.D.   On: 12/26/2019 05:10    Procedures .Critical Care Performed by: Ripley Fraise, MD Authorized by: Christy Gentles,  Elenore Rota, MD   Critical care provider statement:    Critical care time (minutes):  45   Critical care start time:  12/26/2019 6:15 AM   Critical care end time:  12/26/2019 7:00 AM   Critical care time was exclusive of:  Separately billable procedures and treating other patients   Critical care was necessary to treat or prevent imminent or life-threatening deterioration of the following conditions:  Sepsis, shock, respiratory failure, dehydration and metabolic crisis   Critical care was time spent personally by me on the following activities:  Development of treatment plan with patient or surrogate, evaluation of patient's response to treatment, examination of patient, re-evaluation of patient's condition, pulse oximetry, ordering and review of laboratory studies, ordering and review of radiographic studies and ordering and performing treatments and interventions     Medications Ordered in ED Medications  iohexol (OMNIPAQUE) 9 MG/ML oral solution (has no administration in time range)  diltiazem (CARDIZEM) 125 mg in dextrose 5% 125 mL (1 mg/mL) infusion (7.5 mg/hr Intravenous Rate/Dose Change 12/26/19 0707)  sodium chloride 0.9 % bolus 1,000 mL (0 mLs Intravenous Stopped 12/26/19 0700)  ondansetron (ZOFRAN) injection 4 mg (4 mg Intravenous Given 12/26/19 0433)  lactated ringers bolus 1,000 mL (0 mLs Intravenous  Stopped 12/26/19 0700)  fentaNYL (SUBLIMAZE) injection 100 mcg (100 mcg Intravenous Given 12/26/19 0459)    ED Course  I have reviewed the triage vital signs and the nursing notes.  Pertinent labs & imaging results that were available during my care of the patient were reviewed by me and considered in my medical decision making (see chart for details).    MDM Rules/Calculators/A&P                          6:13 AM Patient presents with abdominal pain and dizziness.  When I evaluated patient he was tachycardic and mildly hypotensive.  He had diffuse abdominal tenderness.  His blood pressure is now improving with IV fluids.  It appears the patient is in atrial fibrillation with rapid ventricular response that appears to be new for him.  However due to his persistent abdominal pain, he will require CT imaging.  Patient also has acute renal failure that is new, that will require IV fluids. We will follow closely. 6:57 AM Repeat EKG reveals afib   EKG Interpretation  Date/Time:  Monday December 26 2019 06:54:36 EDT Ventricular Rate:  183 PR Interval:    QRS Duration: 82 QT Interval:  274 QTC Calculation: 479 R Axis:   98 Text Interpretation: Atrial fibrillation with rapid V-rate Borderline right axis deviation Minimal ST depression, inferior leads Borderline T abnormalities, inferior leads Confirmed by Ripley Fraise [phone]) on 12/26/2019 6:57:20 AM      BP is now improved but HR 170-180 with afib.  Will start cardizem infusion.  Pt overall is improving Ct scan results pending at this time 7:03 AM CT scan reveals acute pancreatitis.  No other complicating features. Patient be admitted to the hospital for rehydration, glucose control, management of his atrial fibrillation 7:26 AM  D/w dr Roderic Palau for admission  This patients CHA2DS2-VASc Score and unadjusted Ischemic Stroke Rate (% per year) is equal to 2.2 % stroke rate/year from a score of 2  Above score calculated as 1 point each if  present [CHF, HTN, DM, Vascular=MI/PAD/Aortic Plaque, Age if 65-74, or Male] Above score calculated as 2 points each if present [Age > 75, or Stroke/TIA/TE]   Final Clinical Impression(s) /  ED Diagnoses Final diagnoses:  Atrial fibrillation with rapid ventricular response ([place])  AKI (acute kidney injury) ([place])  Hyperglycemia  Idiopathic acute pancreatitis without infection or necrosis    Rx / DC Orders ED Discharge Orders    None       Ripley Fraise, MD 12/26/19 [phone]

## 2019-12-26 NOTE — Telephone Encounter (Signed)
Call from patient's designated/emergency contact Marnee Spring regarding today's appointment with Dr Aline Brochure due to being admitted to [facility] for another medical issue. To call back to reschedule.

## 2019-12-26 NOTE — Progress Notes (Signed)
ANTICOAGULATION CONSULT NOTE - Initial Consult  Pharmacy Consult for Heparin Indication: atrial fibrillation  No Known Allergies  Patient Measurements: Height: 6\' 1"  (185.4 cm) Weight: (!) 107 kg (235 lb 14.3 oz) IBW/kg (Calculated) : 79.9 HEPARIN DW (KG): 102  Vital Signs: Temp: 98.4 F (36.9 C) (07/26 0411) Temp Source: Oral (07/26 0411) BP: 120/65 (07/26 0830) Pulse Rate: 81 (07/26 0830)  Labs: Recent Labs    12/26/19 0427  HGB 14.9  HCT 45.1  PLT 295  CREATININE 2.29*    Estimated Creatinine Clearance: 53.9 mL/min (A) (by C-G formula based on SCr of 2.29 mg/dL (H)).   Medical History: Past Medical History:  Diagnosis Date  . Bipolar disorder ([place])   . Erectile dysfunction   . Essential hypertension   . GERD (gastroesophageal reflux disease)   . History of pneumonia   . Hyperlipidemia   . Interstitial lung disease (HCC)    Pulmonary eosinophilia  . Type 2 diabetes mellitus (HCC)     Medications:  See med rec  Assessment: Patient with acute pancreatitis and New onset afib. Reviewed home meds and not on any oral anticoagulation. Pharmacy asked to start heparin  Goal of Therapy:  Heparin level 0.3-0.7 units/ml Monitor platelets by anticoagulation protocol: Yes   Plan:  Give 4000 units bolus x 1 Start heparin infusion at 1300 units/hr Check anti-Xa level in 6-8 hours and daily while on heparin Continue to monitor H&H and platelets  Isac Sarna, BS Vena [place], BCPS Clinical Pharmacist Pager [phone] 12/26/2019,9:36 AM

## 2019-12-26 NOTE — Progress Notes (Signed)
[place] for Heparin Indication: atrial fibrillation  No Known Allergies  Patient Measurements: Height: 6\' 1"  (185.4 cm) Weight: (!) 107 kg (235 lb 14.3 oz) IBW/kg (Calculated) : 79.9 HEPARIN DW (KG): 102  Vital Signs: Temp: 98.1 F (36.7 C) (07/26 1743) Temp Source: Oral (07/26 1743) BP: 90/73 (07/26 1743) Pulse Rate: 69 (07/26 1743)  Labs: Recent Labs    12/26/19 0402 12/26/19 0427 12/26/19 1701  HGB  --  14.9  --   HCT  --  45.1  --   PLT  --  295  --   APTT 32  --   --   HEPARINUNFRC  --   --  <0.10*  CREATININE  --  2.29*  --     Estimated Creatinine Clearance: 53.9 mL/min (A) (by C-G formula based on SCr of 2.29 mg/dL (H)).   Medical History: Past Medical History:  Diagnosis Date  . Bipolar disorder ([place])   . Chronic respiratory failure ([place])   . Erectile dysfunction   . Essential hypertension   . GERD (gastroesophageal reflux disease)   . History of pneumonia   . Hyperlipidemia   . Interstitial lung disease (HCC)    Pulmonary eosinophilia  . Obesity   . Pulmonary eosinophilia   . Sinus tachycardia   . Tobacco abuse   . Type 2 diabetes mellitus [facility])     Assessment: 44 y.o. male presenting with suspected pancreatitis and found to have new onset atrial fibrillation, CHADSVASC 3 . Pharmacy has been consulted for IV heparin dosing.   Initial heparin level is subtherapeutic at <0.1 after starting infusion this morning. Lab appears to have been drawn appropriately. Hgb and platelets WNL. No issues with the infusion or overt bleeding noted per RN.  Goal of Therapy:  Heparin level 0.3-0.7 units/ml Monitor platelets by anticoagulation protocol: Yes  Plan:  Heparin bolus of 3000 units x 1 Increase heparin infusion to 1700 units/hr  Check 6h heparin level  Monitor CBC, daily heparin level  Continue to monitor for signs/symptoms of bleeding    Brendolyn Patty, PharmD Clinical Pharmacist  12/26/2019   6:35  PM   Please check AMION for all [place] phone numbers After 10:00 PM, call the [place] [phone]

## 2019-12-26 NOTE — ED Notes (Signed)
Checked sugar, 68, started hypoglycemia protocol, gave 4 oz of juice

## 2019-12-26 NOTE — Consult Note (Addendum)
Cardiology Consultation:   Patient ID: KAZUO DURNIL MRN: [id number]; DOB: 1975-09-10  Admit date: 12/26/2019 Date of Consult: 12/26/2019  Primary Care Provider: Jani Gravel, MD [facility] Cardiologist: Rozann Lesches, MD  [facility] Electrophysiologist:  None    Patient Profile:   LIONAL ICENOGLE is a 44 y.o. male with a hx of Interstitial lung disease with pulmonary eosinophilia, chronic respiratory failure on home O2, ongoing tobacco abuse, DM, HTN, GERD, bipolar disorder, baseline sinus tach, obesity who is being seen today for the evaluation of atrial fibrillation at the request of Dr. Roderic Palau.  History of Present Illness:   Mr. Yam previously saw Dr. Domenic Polite in 04/2019 for evaluation of sinus tachycardia, felt reflexive related to underlying lung disease and comorbid conditions. Zio monitor had shown NSR with rare PACs/PVCs but no abnormal sustained arrhythmias. His echocardiogram in 03/2019 had shown LVEF 50-55%, moderate LVH, indeterminate diastolic filling, no significant valvular disease.   He was in his USOH up until Thursday 12/22/19 when he developed generalized abdominal pain and poor oral intake. He denied any n/v to [place]. He also noted some dizziness and lightheadedness. He has chronic SOB at baseline and chronic cough. SOB seemed slightly worse but cough had not changed. He had no awareness of any palpitations. Due to persistent symptoms he came to the ED for evaluation where he was found to be hypotensive at 90/66 and was treated with IV fluids, also in new onset atrial fib RVR. CT revealed concern for acute pancreatitis. Labwork also reveals hyponatremia of 124 (correcting to 130.9 with severe hyperglycemia of 530), acute kidney injury with BUN 63/Cr 2.29, leukocytosis of 20.4 and lactic acidosis of 2.0, FOBT negative, TSH pending. He was placed on IV diltiazem without bolus with improvement in HR from 190s to 160s. He remains generally unaware of his  tachycardia at present time. Denies ETOH, illicit drug use.  Past Medical History:  Diagnosis Date  . Bipolar disorder ([place])   . Chronic respiratory failure ([place])   . Erectile dysfunction   . Essential hypertension   . GERD (gastroesophageal reflux disease)   . History of pneumonia   . Hyperlipidemia   . Interstitial lung disease (HCC)    Pulmonary eosinophilia  . Obesity   . Pulmonary eosinophilia   . Sinus tachycardia   . Tobacco abuse   . Type 2 diabetes mellitus ([place])     Past Surgical History:  Procedure Laterality Date  . APPENDECTOMY    . CHOLECYSTECTOMY N/A 07/11/2013   Procedure: LAPAROSCOPIC CHOLECYSTECTOMY;  Surgeon: Jamesetta So, MD;  Location: [facility];  Service: General;  Laterality: N/A;  . INCISION AND DRAINAGE ABSCESS / HEMATOMA OF BURSA / KNEE / THIGH    . KNEE SURGERY Bilateral   . VIDEO BRONCHOSCOPY Bilateral 02/09/2018   Procedure: VIDEO BRONCHOSCOPY WITHOUT FLUORO;  Surgeon: Brand Males, MD;  Location: [facility] ENDOSCOPY;  Service: Cardiopulmonary;  Laterality: Bilateral;     Home Medications:  Prior to Admission medications   Medication Sig Start Date End Date Taking? Authorizing Provider  albuterol (VENTOLIN HFA) 108 (90 Base) MCG/ACT inhaler Inhale 2 puffs into the lungs every 6 (six) hours as needed.   Yes [provider]  amLODipine (NORVASC) 5 MG tablet Take 5 mg by mouth daily. 11/30/19  Yes [provider]  divalproex (DEPAKOTE) 500 MG DR tablet Take 1,000 mg by mouth at bedtime.   Yes [provider]  furosemide (LASIX) 40 MG tablet Take 40 mg by mouth as needed.  Yes [provider]  insulin NPH Human (HUMULIN N) 100 UNIT/ML injection Inject 2.2 mLs (220 Units total) into the skin every morning. And syringes 3/day 11/01/19  Yes Renato Shin, MD  lisinopril (ZESTRIL) 20 MG tablet TAKE ONE TABLET BY MOUTH ONCE DAILY. 07/11/19  Yes Corum, Rex Kras, MD  metoprolol tartrate (LOPRESSOR) 25 MG tablet Take 12.5 mg by mouth 2  (two) times daily.    Yes [provider]  omeprazole (PRILOSEC) 20 MG capsule Take 20 mg by mouth at bedtime.   Yes [provider]  risperidone (RISPERDAL) 4 MG tablet Take 4 mg by mouth at bedtime.    Yes [provider]  simvastatin (ZOCOR) 20 MG tablet Take 20 mg by mouth at bedtime.   Yes [provider]  solifenacin (VESICARE) 5 MG tablet Take 5 mg by mouth at bedtime.    Yes [provider]  SYMBICORT 160-4.5 MCG/ACT inhaler Inhale 2 puffs into the lungs 2 (two) times daily. 03/17/19  Yes [provider]  glucose blood (ONETOUCH VERIO) test strip 1 each by Other route 2 (two) times daily. And lancets 2/day 03/08/18   Renato Shin, MD  nicotine (NICODERM CQ - DOSED IN MG/24 HOURS) 21 mg/24hr patch Place 21 mg onto the skin daily. 04/06/19   [provider]  sildenafil (VIAGRA) 100 MG tablet Take 0.5-1 tablets (50-100 mg total) daily as needed by mouth for erectile dysfunction. 04/08/17   Renato Shin, MD  SURE COMFORT INSULIN SYRINGE 30G X 1/2" 1 ML MISC USE AS DIRECTED PER PROVIDER TO INJECT INSULIN DAILY. 12/19/19   Renato Shin, MD    Inpatient Medications: Scheduled Meds: . budesonide (PULMICORT) nebulizer solution  0.25 mg Nebulization BID  . darifenacin  7.5 mg Oral QHS  . divalproex  1,000 mg Oral QHS  . heparin  4,000 Units Intravenous Once  . insulin aspart  0-15 Units Subcutaneous TID WC  . insulin aspart  0-5 Units Subcutaneous QHS  . insulin aspart  20 Units Subcutaneous Once  . insulin glargine  40 Units Subcutaneous Daily  . iohexol      . metoprolol tartrate  2.5 mg Intravenous Q6H  . nicotine  21 mg Transdermal Daily  . pantoprazole  40 mg Oral QHS  . risperidone  4 mg Oral QHS   Continuous Infusions: . diltiazem (CARDIZEM) infusion 15 mg/hr (12/26/19 0832)  . heparin    . lactated ringers 1,000 mL (12/26/19 0911)  . lactated ringers     PRN Meds: acetaminophen **OR** acetaminophen,  HYDROcodone-acetaminophen, levalbuterol, ondansetron **OR** ondansetron (ZOFRAN) IV  Allergies:   No Known Allergies  Social History:   Social History   Socioeconomic History  . Marital status: Single    Spouse name: Not on file  . Number of children: 0  . Years of education: Not on file  . Highest education level: Not on file  Occupational History  . Not on file  Tobacco Use  . Smoking status: Current Some Day Smoker    Packs/day: 0.50    Years: 10.00    Pack years: 5.00    Types: Cigarettes  . Smokeless tobacco: Never Used  Vaping Use  . Vaping Use: Never used  Substance and Sexual Activity  . Alcohol use: No  . Drug use: No  . Sexual activity: Never  Other Topics Concern  . Not on file  Social History Narrative  . Not on file   Social Determinants of Health   Financial Resource Strain:   .  Difficulty of Paying Living Expenses:   Food Insecurity:   . Worried About Charity fundraiser in the Last Year:   . Arboriculturist in the Last Year:   Transportation Needs:   . Film/video editor (Medical):   Marland Kitchen Lack of Transportation (Non-Medical):   Physical Activity:   . Days of Exercise per Week:   . Minutes of Exercise per Session:   Stress:   . Feeling of Stress :   Social Connections:   . Frequency of Communication with Friends and Family:   . Frequency of Social Gatherings with Friends and Family:   . Attends Religious Services:   . Active Member of Clubs or Organizations:   . Attends Archivist Meetings:   Marland Kitchen Marital Status:   Intimate Partner Violence:   . Fear of Current or Ex-Partner:   . Emotionally Abused:   Marland Kitchen Physically Abused:   . Sexually Abused:     Family History:   Family History  Problem Relation Age of Onset  . Diabetes Mother   . Diabetes Maternal Grandmother   . CAD Maternal Grandmother        heart attack in her 57s  . Pancreatitis Neg Hx   . Colon cancer Neg Hx      ROS:  Please see the history of present illness.    All other ROS reviewed and negative.     Physical Exam/Data:   Vitals:   12/26/19 0700 12/26/19 0731 12/26/19 0801 12/26/19 0830  BP: (!) 129/86 (!) 124/112 (!) 117/59 120/65  Pulse: 103 60 65 81  Resp: (!) 30 (!) 30 (!) 26 (!) 35  Temp:      TempSrc:      SpO2: 96% 96% 94% 93%  Weight:      Height:        Intake/Output Summary (Last 24 hours) at 12/26/2019 0955 Last data filed at 12/26/2019 0700 Gross per 24 hour  Intake 2000 ml  Output --  Net 2000 ml   Last 3 Weights 12/26/2019 12/08/2019 11/10/2019  Weight (lbs) 235 lb 14.3 oz 237 lb 232 lb 8 oz  Weight (kg) 107 kg 107.502 kg 105.461 kg     Body mass index is 31.12 kg/m.  General: Well developed, well nourished obese AAM, in no acute distress. Head: Normocephalic, atraumatic, sclera non-icteric, no xanthomas, nares are without discharge. Neck: Negative for carotid bruits. JVP not elevated. Lungs: Rhonchorous throughout, rare occasional wheezing. Breathing is unlabored. Heart: Irregularly irregular, tachycardic, S1 S2 without murmurs, rubs, or gallops.  Abdomen: Soft, non-tender, non-distended with normoactive bowel sounds. No rebound/guarding. Extremities: No clubbing or cyanosis. No edema. Distal pedal pulses are 2+ and equal bilaterally. Neuro: Alert and oriented X 3. Moves all extremities spontaneously. Psych:  Responds to questions appropriately with a normal affect.   EKG:  The EKG was personally reviewed and demonstrates:  atrial fibrillation 183bpm, borderline right axis deviation, nonspecific STT changes  Telemetry:  Telemetry was personally reviewed and demonstrates: atrial fib RVR    Relevant CV Studies: 2D echo 03/2019  1. Left ventricular ejection fraction, by visual estimation, is 50 to  55%. The left ventricle has normal function. There is moderately increased  left ventricular hypertrophy.  2. Indeterminate diastolic filling due to E-A fusion pattern of LV  diastolic filling.  3. Global right  ventricle has normal systolic function.The right  ventricular size is normal. No increase in right ventricular wall  thickness.  4. Left atrial size was  normal.  5. Right atrial size was normal.  6. The mitral valve is normal in structure. No evidence of mitral valve  regurgitation. No evidence of mitral stenosis.  7. The tricuspid valve is normal in structure. Tricuspid valve  regurgitation is trivial.  8. The aortic valve is tricuspid Aortic valve regurgitation was not  visualized by color flow Doppler. Structurally normal aortic valve, with  no evidence of sclerosis or stenosis.  9. The pulmonic valve was not well visualized. Pulmonic valve  regurgitation is not visualized by color flow Doppler.  10. Mildly elevated pulmonary artery systolic pressure.  11. The inferior vena cava is normal in size with greater than 50%  respiratory variability, suggesting right atrial pressure of 3 mmHg.   Laboratory Data:  High Sensitivity Troponin:  No results for input(s): TROPONINIHS in the last 720 hours.   Chemistry Recent Labs  Lab 12/26/19 0427  NA 124*  K 5.1  CL 85*  CO2 24  GLUCOSE 530*  BUN 63*  CREATININE 2.29*  CALCIUM 8.8*  GFRNONAA 34*  GFRAA 39*  ANIONGAP 15    Recent Labs  Lab 12/26/19 0427  PROT 8.0  ALBUMIN 2.5*  AST 16  ALT 16  ALKPHOS 138*  BILITOT 0.6   Hematology Recent Labs  Lab 12/26/19 0427  WBC 20.4*  RBC 5.06  HGB 14.9  HCT 45.1  MCV 89.1  MCH 29.4  MCHC 33.0  RDW 14.7  PLT 295   BNPNo results for input(s): BNP, PROBNP in the last 168 hours.  DDimer No results for input(s): DDIMER in the last 168 hours.   Radiology/Studies:  CT ABDOMEN PELVIS WO CONTRAST  Result Date: 12/26/2019 CLINICAL DATA:  Acute nonlocalized abdominal pain EXAM: CT ABDOMEN AND PELVIS WITHOUT CONTRAST TECHNIQUE: Multidetector CT imaging of the abdomen and pelvis was performed following the standard protocol without IV contrast. COMPARISON:  05/29/2013  FINDINGS: Lower chest: Bilateral pulmonary reticulation that is chronic when compared with the 2019 chest CT. 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 Premature coronary atherosclerotic calcification. Hepatobiliary: Marked fatty infiltration of the liver.Cholecystectomy in the interim. Pancreas: Peripancreatic edema with diffuse pancreatic indistinctness and expansion. Spleen: Unremarkable. Adrenals/Urinary Tract: Negative adrenals. No hydronephrosis or stone. Unremarkable bladder. Stomach/Bowel:  No obstruction. No visible bowel inflammation. Vascular/Lymphatic: No acute vascular abnormality. Diffuse atherosclerotic calcification of the aorta and iliacs. No mass or adenopathy. Reproductive:Negative. Other: Small volume ascites. Musculoskeletal: No acute abnormalities. IMPRESSION: 1. Acute pancreatitis. 2. Hepatic steatosis. 3. Premature atherosclerotic calcification of the aorta and coronaries. 4. Chronic lung disease with history of the eosinophilic pneumonia. Electronically Signed   By: Monte Fantasia M.D.   On: 12/26/2019 06:58   DG Chest Port 1 View  Result Date: 12/26/2019 CLINICAL DATA:  Shortness of breath. Abdominal pain since Thursday which is cramping EXAM: PORTABLE CHEST 1 VIEW COMPARISON:  01/04/2019 FINDINGS: Chronic lung disease with patchy bilateral pulmonary opacity that is unchanged. Normal heart size and mediastinal contours. No visible effusion or pneumothorax. IMPRESSION: Chronic lung disease without acute superimposed finding when compared with prior. Electronically Signed   By: Monte Fantasia M.D.   On: 12/26/2019 05:10   Assessment and Plan:   1. Acute pancreatitis with leukocytosis, lactic acidosis, hyperglycemia, hyponatremia  - per primary team - hold statin given potential adverse effect of pancreatitis, also specific dose-limitation while on concomitant diltiazem  2. New onset atrial fibrillation with RVR - likely precipitated by acute medical illness - continue IV  diltiazem drip and add scheduled IV metoprolol 2.5mg   q6hr (hold home oral metoprolol) - avoid digoxin given renal failure - anticipate HR will continue to improve once medical issues are treated - hold off IV amiodarone for now given that true duration of AF is not currently known, but may need to revisit if hypotension recurs - now on IV heparin for anticoagulation with CHADSVASC 3 (HTN, DM, atherosclerotic disease on CT)  3. Acute kidney injury - ACEi on hold - avoid nephrotoxic agents  4. Premature atherosclerotic calcification of the aorta and coronaries - will need risk factor modification going forward - statin on hold as above  For questions or updates, please contact [place] Please consult [email or web address] for contact info under    Signed, Charlie Pitter, PA-C  12/26/2019 9:55 AM   Attending note:  Patient seen and examined.  I reviewed his records and discussed the case with Ms. Purcell Mouton.  Mr. Cotten presents to the ER complaining of abdominal discomfort and poor appetite as well as intermittent orthostatic lightheadedness since Thursday of last week.  He has not eaten regularly during this time, denies emesis or diarrhea.  Work-up in the ER finds evidence of acute pancreatitis in the setting of uncontrolled type 2 diabetes mellitus with hyperglycemia, pseudohyponatremia, leukocytosis, lactic acidosis and acute renal failure.  He is also noted to be in rapid atrial fibrillation for which we are consulted.  On examination he is in no distress, continues to complain of abdominal pain.  He is afebrile.  Heart rate 150s in atrial fibrillation, recent systolics 749-449 (down in the 80s to 90s at presentation).  Lungs are clear.  Cardiac exam with irregularly irregular tachycardia, no rub or gallop.  Abdomen tender without guarding.  Pertinent lab work includes lactic acid 2.0 down to 1.4, blood cultures pending, most recent glucose 471, potassium 5.1, BUN 63, creatinine 2.29,  sodium 124, AST 16, ALT 16, alkaline phosphatase 138, WBC 20.4.  CT abdomen and pelvis reports evidence of acute pancreatitis with hepatic steatosis, atherosclerosis of the aorta and coronary arteries, and evidence of chronic lung disease with pulmonary reticulation.  Patient being admitted to the hospitalist service with acute pancreatitis, also noted to be in rapid atrial fibrillation.  CHA2DS2-VASc score is 3.  He has been started on IV diltiazem, also adding low-dose IV Lopressor every 6 hours.  Placed on heparin per primary team as well.  Would discontinue ACE inhibitor and also Zocor.  Expect heart rate control/rhythm will settle down as pancreatitis resolves, defer any decision for long-term anticoagulation at this point.  No plan for cardioversion at this time, would hold off echocardiogram until heart rate is better controlled.  Satira Sark, M.D., F.A.C.C.

## 2019-12-26 NOTE — Progress Notes (Deleted)
History and Physical    [name] IFO:[id number] DOB: July 05, 1975 DOA: 12/26/2019  PCP: Jani Gravel, MD  Patient coming from: Home  I have personally briefly reviewed patient's old medical records in [place]  Chief Complaint: Abdominal pain  HPI: [name] is a 44 y.o. 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 He has had associated vomiting. He has not had any fever. Abdominal pain is generalized throughout his abdomen. His p.o. intake has been poor. He has not had any diarrhea or dysuria. He has noted some worsening in his shortness of breath, but he feels it is related to splinting from abdominal pain. He has not had any change in his chronic cough. He has not had any chest pain, or noticed any palpitations.  ED Course: In the emergency room he was noted to be hypotensive on arrival which responded to IV fluids. Creatinine noted to be elevated. Lactic acid also elevated which is improved with hydration, chest x-ray did not show any acute findings. CT abdomen showed evidence of acute pancreatitis. EKG indicated atrial fibrillation with rapid regular response with heart rate in the 160s to 180s. He was started on Cardizem infusion. Blood sugar also noted to be elevated over 500 without evidence of acidosis.  Review of Systems:  Review of Systems  Constitutional: Negative for chills and fever.  HENT: Negative for congestion and sore throat.   Respiratory: Positive for cough, shortness of breath and wheezing.   Cardiovascular: Negative for chest pain and palpitations.  Gastrointestinal: Positive for abdominal pain, nausea and vomiting. Negative for blood in stool, diarrhea and melena.  Genitourinary: Negative for dysuria.    Skin: Negative for rash.    Past Medical History:  Diagnosis Date  . Bipolar disorder ([place])   . Erectile dysfunction   . Essential hypertension   . GERD (gastroesophageal reflux disease)   . History of pneumonia   . Hyperlipidemia   . Interstitial lung disease (HCC)    Pulmonary eosinophilia  . Type 2 diabetes mellitus ([place])     Past Surgical History:  Procedure Laterality Date  . APPENDECTOMY    . CHOLECYSTECTOMY N/A 07/11/2013   Procedure: LAPAROSCOPIC CHOLECYSTECTOMY;  Surgeon: Jamesetta So, MD;  Location: [facility];  Service: General;  Laterality: N/A;  . INCISION AND DRAINAGE ABSCESS / HEMATOMA OF BURSA / KNEE / THIGH    . KNEE SURGERY Bilateral   . VIDEO BRONCHOSCOPY Bilateral 02/09/2018   Procedure: VIDEO BRONCHOSCOPY WITHOUT FLUORO;  Surgeon: Brand Males, MD;  Location: [facility];  Service: Cardiopulmonary;  Laterality: Bilateral;    Social History:  reports that he has been smoking cigarettes. He has a 5.00 pack-year smoking history. He has never used smokeless tobacco. He reports that he does not drink alcohol and does not use drugs.  No Known Allergies  Family History  Problem Relation Age of Onset  . Diabetes Mother   . Diabetes Maternal Grandmother   . Pancreatitis Neg Hx   . Colon cancer Neg Hx      Prior to Admission medications   Medication Sig Start Date End Date Taking? Authorizing Provider  albuterol (VENTOLIN HFA) 108 (90 Base) MCG/ACT inhaler Inhale 2 puffs into the lungs every 6 (six) hours as needed.   Yes [provider]  amLODipine (NORVASC) 5 MG tablet Take 5 mg by mouth daily. 11/30/19  Yes [provider]  divalproex (DEPAKOTE) 500 MG DR tablet Take 1,000 mg by mouth at bedtime.   Yes [provider]  furosemide (LASIX) 40 MG tablet Take 40 mg by mouth as needed.    Yes [provider]  insulin NPH Human (HUMULIN N) 100 UNIT/ML injection Inject 2.2 mLs (220 Units total) into the skin every morning.  And syringes 3/day 11/01/19  Yes Renato Shin, MD  lisinopril (ZESTRIL) 20 MG tablet TAKE ONE TABLET BY MOUTH ONCE DAILY. 07/11/19  Yes Corum, Rex Kras, MD  metoprolol tartrate (LOPRESSOR) 25 MG tablet Take 12.5 mg by mouth 2 (two) times daily.    Yes [provider]  omeprazole (PRILOSEC) 20 MG capsule Take 20 mg by mouth at bedtime.   Yes [provider]  risperidone (RISPERDAL) 4 MG tablet Take 4 mg by mouth at bedtime.    Yes [provider]  simvastatin (ZOCOR) 20 MG tablet Take 20 mg by mouth at bedtime.   Yes [provider]  solifenacin (VESICARE) 5 MG tablet Take 5 mg by mouth at bedtime.    Yes [provider]  SYMBICORT 160-4.5 MCG/ACT inhaler Inhale 2 puffs into the lungs 2 (two) times daily. 03/17/19  Yes [provider]  glucose blood (ONETOUCH VERIO) test strip 1 each by Other route 2 (two) times daily. And lancets 2/day 03/08/18   Renato Shin, MD  nicotine (NICODERM CQ - DOSED IN MG/24 HOURS) 21 mg/24hr patch Place 21 mg onto the skin daily. 04/06/19   [provider]  sildenafil (VIAGRA) 100 MG tablet Take 0.5-1 tablets (50-100 mg total) daily as needed by mouth for erectile dysfunction. 04/08/17   Renato Shin, MD  SURE COMFORT INSULIN SYRINGE 30G X 1/2" 1 ML MISC USE AS DIRECTED PER PROVIDER TO INJECT INSULIN DAILY. 12/19/19   Renato Shin, MD    Physical Exam: Vitals:   12/26/19 0700 12/26/19 0731 12/26/19 0801 12/26/19 0830  BP: (!) 129/86 (!) 124/112 (!) 117/59 120/65  Pulse: 103 60 65 81  Resp: (!) 30 (!) 30 (!) 26 (!) 35  Temp:      TempSrc:      SpO2: 96% 96% 94% 93%  Weight:      Height:        Constitutional: NAD, calm, comfortable Eyes: PERRL, lids and conjunctivae normal ENMT: Mucous membranes are dry. Posterior pharynx clear of any exudate or lesions.Normal dentition.  Neck: normal, supple, no masses, no thyromegaly Respiratory: Bilateral wheezing and rhonchi. Normal respiratory effort. No accessory  muscle use.  Cardiovascular: Irregular rate and rhythm. No extremity edema. 2+ pedal pulses. No carotid bruits.  Abdomen: Diffusely tender, soft, no masses palpated. No hepatosplenomegaly. Bowel sounds positive.  Musculoskeletal: no clubbing / cyanosis. No joint deformity upper and lower extremities. Good ROM, no contractures. Normal muscle tone.  Skin: no rashes, lesions, ulcers. No induration Neurologic: CN 2-12 grossly intact. Sensation intact, DTR normal. Strength 5/5 in all 4.  Psychiatric: Normal judgment and insight. Alert and oriented x 3. Normal mood.    Labs on Admission: I have personally reviewed following labs and imaging studies  CBC: Recent Labs  Lab 12/26/19 0427  WBC 20.4*  NEUTROABS 17.3*  HGB 14.9  HCT 45.1  MCV 89.1  PLT 950   Basic Metabolic Panel: Recent Labs  Lab 12/26/19 0427  NA 124*  K 5.1  CL 85*  CO2 24  GLUCOSE 530*  BUN 63*  CREATININE 2.29*  CALCIUM 8.8*   GFR: Estimated Creatinine Clearance: 53.9 mL/min (A) (by C-G formula based on SCr of 2.29 mg/dL (H)). Liver Function Tests: Recent Labs  Lab 12/26/19 0427  AST 16  ALT 16  ALKPHOS 138*  BILITOT 0.6  PROT 8.0  ALBUMIN 2.5*   Recent Labs  Lab 12/26/19 0427  LIPASE 47   No results for input(s): AMMONIA in the last 168 hours. Coagulation Profile: No results for input(s): INR, PROTIME in the last 168 hours. Cardiac Enzymes: No results for input(s): CKTOTAL, CKMB, CKMBINDEX, TROPONINI in the last 168 hours. BNP (last 3 results) No results for input(s): PROBNP in the last 8760 hours. HbA1C: No results for input(s): HGBA1C in the last 72 hours. CBG: Recent Labs  Lab 12/26/19 0412 12/26/19 0736  GLUCAP 485* 471*   Lipid Profile: No results for input(s): CHOL, HDL, LDLCALC, TRIG, CHOLHDL, LDLDIRECT in the last 72 hours. Thyroid Function Tests: No results for input(s): TSH, T4TOTAL, FREET4, T3FREE, THYROIDAB in the last 72 hours. Anemia Panel: No results for input(s):  VITAMINB12, FOLATE, FERRITIN, TIBC, IRON, RETICCTPCT in the last 72 hours. Urine analysis:    Component Value Date/Time   COLORURINE YELLOW 12/26/2019 [place] 12/26/2019 0414   LABSPEC 1.029 12/26/2019 0414   PHURINE 5.0 12/26/2019 0414   GLUCOSEU >=500 (A) 12/26/2019 0414   HGBUR SMALL (A) 12/26/2019 0414   BILIRUBINUR NEGATIVE 12/26/2019 0414   KETONESUR 5 (A) 12/26/2019 0414   PROTEINUR 30 (A) 12/26/2019 0414   UROBILINOGEN 0.2 05/28/2013 2226   NITRITE NEGATIVE 12/26/2019 0414   LEUKOCYTESUR NEGATIVE 12/26/2019 0414    Radiological Exams on Admission: CT ABDOMEN PELVIS WO CONTRAST  Result Date: 12/26/2019 CLINICAL DATA:  Acute nonlocalized abdominal pain EXAM: CT ABDOMEN AND PELVIS WITHOUT CONTRAST TECHNIQUE: Multidetector CT imaging of the abdomen and pelvis was performed following the standard protocol without IV contrast. COMPARISON:  05/29/2013 FINDINGS: Lower chest: Bilateral pulmonary reticulation that is chronic when compared with the 2019 chest CT. 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 Premature coronary atherosclerotic calcification. Hepatobiliary: Marked fatty infiltration of the liver.Cholecystectomy in the interim. Pancreas: Peripancreatic edema with diffuse pancreatic indistinctness and expansion. Spleen: Unremarkable. Adrenals/Urinary Tract: Negative adrenals. No hydronephrosis or stone. Unremarkable bladder. Stomach/Bowel:  No obstruction. No visible bowel inflammation. Vascular/Lymphatic: No acute vascular abnormality. Diffuse atherosclerotic calcification of the aorta and iliacs. No mass or adenopathy. Reproductive:Negative. Other: Small volume ascites. Musculoskeletal: No acute abnormalities. IMPRESSION: 1. Acute pancreatitis. 2. Hepatic steatosis. 3. Premature atherosclerotic calcification of the aorta and coronaries. 4. Chronic lung disease with history of the eosinophilic pneumonia. Electronically Signed   By: Monte Fantasia M.D.   On: 12/26/2019  06:58   DG Chest Port 1 View  Result Date: 12/26/2019 CLINICAL DATA:  Shortness of breath. Abdominal pain since Thursday which is cramping EXAM: PORTABLE CHEST 1 VIEW COMPARISON:  01/04/2019 FINDINGS: Chronic lung disease with patchy bilateral pulmonary opacity that is unchanged. Normal heart size and mediastinal contours. No visible effusion or pneumothorax. IMPRESSION: Chronic lung disease without acute superimposed finding when compared with prior. Electronically Signed   By: Monte Fantasia M.D.   On: 12/26/2019 05:10    EKG: Independently reviewed. Rapid atrial fibrillation  Assessment/Plan Active Problems:   Pancreatitis, acute   Bipolar 1 disorder, mixed, moderate (HCC)   Uncontrolled type 1 diabetes mellitus ([place])   ILD (interstitial lung disease) ([place])   Chronic respiratory failure with hypoxia (HCC)   Atrial fibrillation with RVR ([place])  AKI (acute kidney injury) ([place])   HTN (hypertension)   HLD (hyperlipidemia)     1. Atrial fibrillation with rapid ventricular response. New onset. Suspect this is been precipitated by underlying pancreatitis/dehydration. He has been started on a Cardizem infusion which has been titrated up to 15 mg/h. Will continue on low-dose metoprolol which he takes at home. Add on TSH to labs. Can consider echocardiogram once heart rate is better controlled. Consult cardiology to help with further heart rate management. CHADSVASc score of 2 for hypertension and diabetes. Will start on heparin infusion. 2. Acute pancreatitis. Although lipase is normal, he does have CT evidence of pancreatitis and clinically he has abdominal pain and vomiting. He reports that his abdominal pain improved after receiving fentanyl in the emergency room. At home he was using Tylenol. Keep n.p.o. for now, continue IV fluids and pain management 3. Acute kidney injury. Likely prerenal related to poor p.o. intake. CT imaging did not indicate any hydronephrosis. Will hold home dose of  lisinopril. Continue IV fluids and monitor urine output. 4. Type 1 diabetes. Patient is followed by endocrinology. He takes NPH insulin 200 to 220 units once a day. Since his p.o. intake will be limited, will transition to Lantus and sliding scale insulin. 5. Chronic respiratory failure with hypoxia due to interstitial lung disease. He is chronically on 2 L. Respiratory status appears to be near baseline. Chest x-ray did not show any acute findings. Continue inhaled steroids and bronchodilators as needed. 6. Hypertension. Chronically on lisinopril and amlodipine. Will hold for now to allow for heart rate control. 7. Bipolar disorder. Appears stable at this time. Continue home medications. 8. HLD. Continue statin 9. GERD. Continue PPI  DVT prophylaxis: Heparin infusion Code Status: Full code Family Communication: Discussed with patient Disposition Plan: Discharge home once heart rate is stable Consults called: Cardiology Admission status: Inpatient, stepdown  Severity of Illness: The appropriate patient status for this patient is INPATIENT. Inpatient status is judged to be reasonable and necessary in order to provide the required intensity of service to ensure the patient's safety. The patient's presenting symptoms, physical exam findings, and initial radiographic and laboratory data in the context of their chronic comorbidities is felt to place them at high risk for further clinical deterioration. Furthermore, it is not anticipated that the patient will be medically stable for discharge from the hospital within 2 midnights of admission. The following factors support the patient status of inpatient.    "           The patient's presenting symptoms include  abdominal pain, nausea and vomiting "           The worrisome physical exam findings include  tachycardic with heart rate in the 160s to 180s, abdominal tenderness, elevated lactic acid, hypotension "           The initial radiographic and  laboratory data are worrisome because of  tachycardia on EKG, acute pancreatitis on CT, elevated creatinine, elevated lactic acid "           The chronic co-morbidities include  chronic respiratory failure on 2 L, interstitial lung disease, type 1 diabetes     * I certify that at the point of admission it is my clinical judgment that the patient will require inpatient hospital care spanning beyond 2 midnights from the point of admission due to high intensity of service, high risk for further deterioration and high frequency of surveillance required.Kathie Dike MD [facility]  If 7PM-7AM, please contact night-coverage [email or web address]   12/26/2019, 9:16 AM

## 2019-12-26 NOTE — Progress Notes (Addendum)
Inpatient Diabetes Program Recommendations  AACE/ADA: New Consensus Statement on Inpatient Glycemic Control (2015)  Target Ranges:  Prepandial:   less than 140 mg/dL      Peak postprandial:   less than 180 mg/dL (1-2 hours)      Critically ill patients:  140 - 180 mg/dL   Lab Results  Component Value Date   GLUCAP 320 (H) 12/26/2019   HGBA1C 9.3 (A) 11/01/2019    Review of Glycemic Control Results for JOURDYN, FERRIN (MRN [id number]) as of 12/26/2019 15:32  Ref. Range 12/26/2019 04:12 12/26/2019 07:36 12/26/2019 10:13 12/26/2019 11:39  Glucose-Capillary Latest Ref Range: 70 - 99 mg/dL 485 (H) 471 (H) 395 (H) Lantus 40 units Novolog 20 units 320 (H) Novolog 11 units   Diabetes history: DM Outpatient Diabetes medications: Humulin N 210 units qd  Current orders for Inpatient glycemic control: Lantus 40 units qd + Novolog correction moderate tid + hs 0-5 units   Inpatient Diabetes Program Recommendations:   Noted patient is in Emergency room. Patient sees Dr. Renato Shin for endocrinologist with last office visit 11/01/19 with above regimen.  Received Novolog 20 units @ 1017 Received Novolog 11 units @ 11:55. Secure chat to RN Vladimir Creeks to request repeat CBG. Will follow during hospitalization.  Thank you, Nani Gasser. Sasuke Yaffe, RN, MSN, CDE  Diabetes Coordinator Inpatient Glycemic Control Team Team Pager [phone] (8am-5pm) 12/26/2019 3:37 PM

## 2019-12-26 NOTE — ED Notes (Signed)
Date and time results received: 12/26/19 0456  Test: glucose Critical Value: 530  Name of Provider Notified: Christy Gentles, MD  Orders Received? Or Actions Taken?: acknowledged

## 2019-12-26 NOTE — ED Notes (Signed)
Rechecked sugar it was 63, gave regular soda, notified md, will recheck in 15 minutes.

## 2019-12-27 ENCOUNTER — Inpatient Hospital Stay (HOSPITAL_COMMUNITY): Payer: Medicaid Other

## 2019-12-27 DIAGNOSIS — E1065 Type 1 diabetes mellitus with hyperglycemia: Secondary | ICD-10-CM

## 2019-12-27 DIAGNOSIS — F3162 Bipolar disorder, current episode mixed, moderate: Secondary | ICD-10-CM | POA: Diagnosis not present

## 2019-12-27 DIAGNOSIS — I4891 Unspecified atrial fibrillation: Secondary | ICD-10-CM

## 2019-12-27 DIAGNOSIS — J9611 Chronic respiratory failure with hypoxia: Secondary | ICD-10-CM | POA: Diagnosis not present

## 2019-12-27 LAB — COMPREHENSIVE METABOLIC PANEL
ALT: 22 U/L (ref 0–44)
AST: 30 U/L (ref 15–41)
Albumin: 2.1 g/dL — ABNORMAL LOW (ref 3.5–5.0)
Alkaline Phosphatase: 148 U/L — ABNORMAL HIGH (ref 38–126)
Anion gap: 7 (ref 5–15)
BUN: 45 mg/dL — ABNORMAL HIGH (ref 6–20)
CO2: 29 mmol/L (ref 22–32)
Calcium: 8 mg/dL — ABNORMAL LOW (ref 8.9–10.3)
Chloride: 93 mmol/L — ABNORMAL LOW (ref 98–111)
Creatinine, Ser: 1.39 mg/dL — ABNORMAL HIGH (ref 0.61–1.24)
GFR calc Af Amer: >60 mL/min (ref >60)
GFR calc non Af Amer: >60 mL/min (ref >60)
Glucose, Bld: 242 mg/dL — ABNORMAL HIGH (ref 70–99)
Potassium: 4.2 mmol/L (ref 3.5–5.1)
Sodium: 129 mmol/L — ABNORMAL LOW (ref 135–145)
Total Bilirubin: 0.3 mg/dL (ref 0.3–1.2)
Total Protein: 6.4 g/dL — ABNORMAL LOW (ref 6.5–8.1)

## 2019-12-27 LAB — GLUCOSE, CAPILLARY
Glucose-Capillary: 192 mg/dL — ABNORMAL HIGH (ref 70–99)
Glucose-Capillary: 135 mg/dL — ABNORMAL HIGH (ref 70–99)
Glucose-Capillary: 160 mg/dL — ABNORMAL HIGH (ref 70–99)
Glucose-Capillary: 150 mg/dL — ABNORMAL HIGH (ref 70–99)

## 2019-12-27 LAB — MRSA PCR SCREENING: MRSA by PCR: NEGATIVE

## 2019-12-27 LAB — ECHOCARDIOGRAM COMPLETE
Area-P 1/2: 4.08 cm2
Height: 73 in
S' Lateral: 4.02 cm
Weight: 3802.49 oz

## 2019-12-27 LAB — CBC
HCT: 35.4 % — ABNORMAL LOW (ref 39.0–52.0)
Hemoglobin: 11.2 g/dL — ABNORMAL LOW (ref 13.0–17.0)
MCH: 29.5 pg (ref 26.0–34.0)
MCHC: 31.6 g/dL (ref 30.0–36.0)
MCV: 93.2 fL (ref 80.0–100.0)
Platelets: 212 10*3/uL (ref 150–400)
RBC: 3.8 MIL/uL — ABNORMAL LOW (ref 4.22–5.81)
RDW: 15 % (ref 11.5–15.5)
WBC: 13.6 10*3/uL — ABNORMAL HIGH (ref 4.0–10.5)
nRBC: 0 % (ref 0.0–0.2)

## 2019-12-27 LAB — HEPARIN LEVEL (UNFRACTIONATED)
Heparin Unfractionated: 0.13 IU/mL — ABNORMAL LOW (ref 0.30–0.70)
Heparin Unfractionated: <0.1 IU/mL — ABNORMAL LOW (ref 0.30–0.70)

## 2019-12-27 MED ORDER — HEPARIN BOLUS VIA INFUSION
3000.0000 [IU] | Freq: Once | INTRAVENOUS | Status: AC
  Administered 2019-12-27: 3000 [IU] via INTRAVENOUS
  Filled 2019-12-27: qty 3000

## 2019-12-27 MED ORDER — ASPIRIN EC 81 MG PO TBEC
81.0000 mg | DELAYED_RELEASE_TABLET | Freq: Every day | ORAL | Status: DC
  Administered 2019-12-27 – 2019-12-28 (×2): 81 mg via ORAL
  Filled 2019-12-27 (×2): qty 1

## 2019-12-27 MED ORDER — DILTIAZEM HCL 60 MG PO TABS: 60.0000 mg | ORAL_TABLET | Freq: Four times a day (QID) | ORAL | Status: DC

## 2019-12-27 MED ORDER — IPRATROPIUM-ALBUTEROL 0.5-2.5 (3) MG/3ML IN SOLN
3.0000 mL | Freq: Four times a day (QID) | RESPIRATORY_TRACT | Status: DC
  Administered 2019-12-27 – 2019-12-28 (×4): 3 mL via RESPIRATORY_TRACT
  Filled 2019-12-27 (×4): qty 3

## 2019-12-27 MED ORDER — METOPROLOL SUCCINATE ER 25 MG PO TB24
25.0000 mg | ORAL_TABLET | Freq: Every day | ORAL | Status: DC
  Administered 2019-12-27: 25 mg via ORAL
  Filled 2019-12-27: qty 1

## 2019-12-27 MED ORDER — ENOXAPARIN SODIUM 40 MG/0.4ML ~~LOC~~ SOLN: 40.0000 mg | SUBCUTANEOUS | Status: DC

## 2019-12-27 NOTE — H&P (Signed)
History and Physical    [name] VPX:[id number] DOB: [birth year] DOA: 12/26/2019  PCP: Jani Gravel, MD  Patient coming from: Home  I have personally briefly reviewed patient's old medical records in [place]  Chief Complaint: Abdominal pain  HPI: [name] is a 44 y.o. 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 He has had associated vomiting. He has not had any fever. Abdominal pain is generalized throughout his abdomen. His p.o. intake has been poor. He has not had any diarrhea or dysuria. He has noted some worsening in his shortness of breath, but he feels it is related to splinting from abdominal pain. He has not had any change in his chronic cough. He has not had any chest pain, or noticed any palpitations.  ED Course: In the emergency room he was noted to be hypotensive on arrival which responded to IV fluids. Creatinine noted to be elevated. Lactic acid also elevated which is improved with hydration, chest x-ray did not show any acute findings. CT abdomen showed evidence of acute pancreatitis. EKG indicated atrial fibrillation with rapid regular response with heart rate in the 160s to 180s. He was started on Cardizem infusion. Blood sugar also noted to be elevated over 500 without evidence of acidosis.  Review of Systems:  Review of Systems  Constitutional: Negative for chills and fever.  HENT: Negative for congestion and sore throat.   Respiratory: Positive for cough, shortness of breath and wheezing.   Cardiovascular: Negative for chest pain and palpitations.  Gastrointestinal: Positive for abdominal pain, nausea and vomiting. Negative for blood in stool, diarrhea and melena.  Genitourinary: Negative for dysuria.    Skin: Negative for rash.   Past Medical History:  Diagnosis Date  . Bipolar disorder ([place])   . Chronic respiratory failure ([place])   . Erectile dysfunction   . Essential hypertension   . GERD (gastroesophageal reflux disease)   . History of pneumonia   . Hyperlipidemia   . Interstitial lung disease (HCC)    Pulmonary eosinophilia  . Obesity   . Pulmonary eosinophilia   . Sinus tachycardia   . Tobacco abuse   . Type 2 diabetes mellitus ([place])     Past Surgical History:  Procedure Laterality Date  . APPENDECTOMY    . CHOLECYSTECTOMY N/A 07/11/2013   Procedure: LAPAROSCOPIC CHOLECYSTECTOMY;  Surgeon: Jamesetta So, MD;  Location: [facility];  Service: General;  Laterality: N/A;  . INCISION AND DRAINAGE ABSCESS / HEMATOMA OF BURSA / KNEE / THIGH    . KNEE SURGERY Bilateral   . VIDEO BRONCHOSCOPY Bilateral 02/09/2018   Procedure: VIDEO BRONCHOSCOPY WITHOUT FLUORO;  Surgeon: Brand Males, MD;  Location: [facility];  Service: Cardiopulmonary;  Laterality: Bilateral;    Social History:  reports that he has been smoking cigarettes. He has a 5.00 pack-year smoking history. He has never used smokeless tobacco. He reports that he does not drink alcohol and does not use drugs.  No Known Allergies  Family History  Problem Relation Age of Onset  . Diabetes Mother   . Diabetes Maternal Grandmother   . CAD Maternal Grandmother        heart attack in her 57s  . Pancreatitis Neg Hx   . Colon cancer Neg Hx  Prior to Admission medications   Medication Sig Start Date End Date Taking? Authorizing Provider  albuterol (VENTOLIN HFA) 108 (90 Base) MCG/ACT inhaler Inhale 2 puffs into the lungs every 6 (six) hours as needed.   Yes [provider]  amLODipine (NORVASC) 5 MG tablet Take 5 mg by mouth daily. 11/30/19  Yes [provider]  divalproex (DEPAKOTE) 500 MG DR tablet Take 1,000 mg by mouth at bedtime.   Yes [provider]  furosemide (LASIX) 40 MG tablet  Take 40 mg by mouth as needed.    Yes [provider]  insulin NPH Human (HUMULIN N) 100 UNIT/ML injection Inject 2.2 mLs (220 Units total) into the skin every morning. And syringes 3/day 11/01/19  Yes Renato Shin, MD  lisinopril (ZESTRIL) 20 MG tablet TAKE ONE TABLET BY MOUTH ONCE DAILY. 07/11/19  Yes Corum, Rex Kras, MD  metoprolol tartrate (LOPRESSOR) 25 MG tablet Take 12.5 mg by mouth 2 (two) times daily.    Yes [provider]  omeprazole (PRILOSEC) 20 MG capsule Take 20 mg by mouth at bedtime.   Yes [provider]  risperidone (RISPERDAL) 4 MG tablet Take 4 mg by mouth at bedtime.    Yes [provider]  simvastatin (ZOCOR) 20 MG tablet Take 20 mg by mouth at bedtime.   Yes [provider]  solifenacin (VESICARE) 5 MG tablet Take 5 mg by mouth at bedtime.    Yes [provider]  SYMBICORT 160-4.5 MCG/ACT inhaler Inhale 2 puffs into the lungs 2 (two) times daily. 03/17/19  Yes [provider]  glucose blood (ONETOUCH VERIO) test strip 1 each by Other route 2 (two) times daily. And lancets 2/day 03/08/18   Renato Shin, MD  nicotine (NICODERM CQ - DOSED IN MG/24 HOURS) 21 mg/24hr patch Place 21 mg onto the skin daily. 04/06/19   [provider]  sildenafil (VIAGRA) 100 MG tablet Take 0.5-1 tablets (50-100 mg total) daily as needed by mouth for erectile dysfunction. 04/08/17   Renato Shin, MD  SURE COMFORT INSULIN SYRINGE 30G X 1/2" 1 ML MISC USE AS DIRECTED PER PROVIDER TO INJECT INSULIN DAILY. 12/19/19   Renato Shin, MD    Physical Exam: Vitals:   12/27/19 1100 12/27/19 1124 12/27/19 1200 12/27/19 1300  BP: 107/84  107/66 116/67  Pulse: 105 105 (!) 107 100  Resp: 22 19 23 23   Temp:  99.7 F (37.6 C)    TempSrc:  Oral    SpO2: 94% 96% 97% 97%  Weight:      Height:        Constitutional: NAD, calm, comfortable Eyes: PERRL, lids and conjunctivae normal ENMT: Mucous membranes are dry. Posterior pharynx clear of any  exudate or lesions.Normal dentition.  Neck: normal, supple, no masses, no thyromegaly Respiratory: Bilateral wheezing and rhonchi. Normal respiratory effort. No accessory muscle use.  Cardiovascular: Irregular rate and rhythm. No extremity edema. 2+ pedal pulses. No carotid bruits.  Abdomen: Diffusely tender, soft, no masses palpated. No hepatosplenomegaly. Bowel sounds positive.  Musculoskeletal: no clubbing / cyanosis. No joint deformity upper and lower extremities. Good ROM, no contractures. Normal muscle tone.  Skin: no rashes, lesions, ulcers. No induration Neurologic: CN 2-12 grossly intact. Sensation intact, DTR normal. Strength 5/5 in all 4.  Psychiatric: Normal judgment and insight. Alert and oriented x 3. Normal mood.    Labs on Admission: I have personally reviewed following labs and imaging studies  CBC: Recent Labs  Lab 12/26/19 0427 12/27/19 0209  WBC  20.4* 13.6*  NEUTROABS 17.3*  --   HGB 14.9 11.2*  HCT 45.1 35.4*  MCV 89.1 93.2  PLT 295 102   Basic Metabolic Panel: Recent Labs  Lab 12/26/19 0427 12/27/19 0209  NA 124* 129*  K 5.1 4.2  CL 85* 93*  CO2 24 29  GLUCOSE 530* 242*  BUN 63* 45*  CREATININE 2.29* 1.39*  CALCIUM 8.8* 8.0*   GFR: Estimated Creatinine Clearance: 89.2 mL/min (A) (by C-G formula based on SCr of 1.39 mg/dL (H)). Liver Function Tests: Recent Labs  Lab 12/26/19 0427 12/27/19 0209  AST 16 30  ALT 16 22  ALKPHOS 138* 148*  BILITOT 0.6 0.3  PROT 8.0 6.4*  ALBUMIN 2.5* 2.1*   Recent Labs  Lab 12/26/19 0427  LIPASE 47   No results for input(s): AMMONIA in the last 168 hours. Coagulation Profile: No results for input(s): INR, PROTIME in the last 168 hours. Cardiac Enzymes: No results for input(s): CKTOTAL, CKMB, CKMBINDEX, TROPONINI in the last 168 hours. BNP (last 3 results) No results for input(s): PROBNP in the last 8760 hours. HbA1C: No results for input(s): HGBA1C in the last 72 hours. CBG: Recent Labs  Lab  12/26/19 1819 12/26/19 2214 12/26/19 2304 12/27/19 0727 12/27/19 1123  GLUCAP 202* 278* 267* 192* 135*   Lipid Profile: No results for input(s): CHOL, HDL, LDLCALC, TRIG, CHOLHDL, LDLDIRECT in the last 72 hours. Thyroid Function Tests: Recent Labs    12/26/19 0936  TSH 1.111   Anemia Panel: No results for input(s): VITAMINB12, FOLATE, FERRITIN, TIBC, IRON, RETICCTPCT in the last 72 hours. Urine analysis:    Component Value Date/Time   COLORURINE YELLOW 12/26/2019 [place] 12/26/2019 0414   LABSPEC 1.029 12/26/2019 0414   PHURINE 5.0 12/26/2019 0414   GLUCOSEU >=500 (A) 12/26/2019 0414   HGBUR SMALL (A) 12/26/2019 0414   BILIRUBINUR NEGATIVE 12/26/2019 0414   KETONESUR 5 (A) 12/26/2019 0414   PROTEINUR 30 (A) 12/26/2019 0414   UROBILINOGEN 0.2 05/28/2013 2226   NITRITE NEGATIVE 12/26/2019 0414   LEUKOCYTESUR NEGATIVE 12/26/2019 0414    Radiological Exams on Admission: CT ABDOMEN PELVIS WO CONTRAST  Result Date: 12/26/2019 CLINICAL DATA:  Acute nonlocalized abdominal pain EXAM: CT ABDOMEN AND PELVIS WITHOUT CONTRAST TECHNIQUE: Multidetector CT imaging of the abdomen and pelvis was performed following the standard protocol without IV contrast. COMPARISON:  05/29/2013 FINDINGS: Lower chest: Bilateral pulmonary reticulation that is chronic when compared with the 2019 chest CT. 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 Premature coronary atherosclerotic calcification. Hepatobiliary: Marked fatty infiltration of the liver.Cholecystectomy in the interim. Pancreas: Peripancreatic edema with diffuse pancreatic indistinctness and expansion. Spleen: Unremarkable. Adrenals/Urinary Tract: Negative adrenals. No hydronephrosis or stone. Unremarkable bladder. Stomach/Bowel:  No obstruction. No visible bowel inflammation. Vascular/Lymphatic: No acute vascular abnormality. Diffuse atherosclerotic calcification of the aorta and iliacs. No mass or adenopathy.  Reproductive:Negative. Other: Small volume ascites. Musculoskeletal: No acute abnormalities. IMPRESSION: 1. Acute pancreatitis. 2. Hepatic steatosis. 3. Premature atherosclerotic calcification of the aorta and coronaries. 4. Chronic lung disease with history of the eosinophilic pneumonia. Electronically Signed   By: Monte Fantasia M.D.   On: 12/26/2019 06:58   DG Chest Port 1 View  Result Date: 12/26/2019 CLINICAL DATA:  Shortness of breath. Abdominal pain since Thursday which is cramping EXAM: PORTABLE CHEST 1 VIEW COMPARISON:  01/04/2019 FINDINGS: Chronic lung disease with patchy bilateral pulmonary opacity that is unchanged. Normal heart size and mediastinal contours. No visible effusion or pneumothorax. IMPRESSION: Chronic lung disease without  acute superimposed finding when compared with prior. Electronically Signed   By: Monte Fantasia M.D.   On: 12/26/2019 05:10    EKG: Independently reviewed. Rapid atrial fibrillation  Assessment/Plan Active Problems:   Pancreatitis, acute   Bipolar 1 disorder, mixed, moderate (HCC)   Uncontrolled type 1 diabetes mellitus (HCC)   ILD (interstitial lung disease) ([place])   Chronic respiratory failure with hypoxia (HCC)   Atrial fibrillation with RVR (HCC)   AKI (acute kidney injury) ([place])   HTN (hypertension)   HLD (hyperlipidemia)     1. Atrial fibrillation with rapid ventricular response. New onset. Suspect this is been precipitated by underlying pancreatitis/dehydration. He has been started on a Cardizem infusion which has been titrated up to 15 mg/h. Will continue on low-dose metoprolol which he takes at home. Add on TSH to labs. Can consider echocardiogram once heart rate is better controlled. Consult cardiology to help with further heart rate management. CHADSVASc score of 2 for hypertension and diabetes. Will start on heparin infusion. 2. Acute pancreatitis. Although lipase is normal, he does have CT evidence of pancreatitis and clinically he has  abdominal pain and vomiting. He reports that his abdominal pain improved after receiving fentanyl in the emergency room. At home he was using Tylenol. Keep n.p.o. for now, continue IV fluids and pain management 3. Acute kidney injury. Likely prerenal related to poor p.o. intake. CT imaging did not indicate any hydronephrosis. Will hold home dose of lisinopril. Continue IV fluids and monitor urine output. 4. Type 1 diabetes. Patient is followed by endocrinology. He takes NPH insulin 200 to 220 units once a day. Since his p.o. intake will be limited, will transition to Lantus and sliding scale insulin. 5. Chronic respiratory failure with hypoxia due to interstitial lung disease. He is chronically on 2 L. Respiratory status appears to be near baseline. Chest x-ray did not show any acute findings. Continue inhaled steroids and bronchodilators as needed. 6. Hypertension. Chronically on lisinopril and amlodipine. Will hold for now to allow for heart rate control. 7. Bipolar disorder. Appears stable at this time. Continue home medications. 8. HLD. Continue statin 9. GERD. Continue PPI  DVT prophylaxis: Heparin infusion Code Status: Full code Family Communication: Discussed with patient Disposition Plan: Discharge home once heart rate is stable Consults called: Cardiology Admission status: Inpatient, stepdown  Severity of Illness: The appropriate patient status for this patient is INPATIENT. Inpatient status is judged to be reasonable and necessary in order to provide the required intensity of service to ensure the patient's safety. The patient's presenting symptoms, physical exam findings, and initial radiographic and laboratory data in the context of their chronic comorbidities is felt to place them at high risk for further clinical deterioration. Furthermore, it is not anticipated that the patient will be medically stable for discharge from the hospital within 2 midnights of admission. The following  factors support the patient status of inpatient.    "           The patient's presenting symptoms include  abdominal pain, nausea and vomiting "           The worrisome physical exam findings include  tachycardic with heart rate in the 160s to 180s, abdominal tenderness, elevated lactic acid, hypotension "           The initial radiographic and laboratory data are worrisome because of  tachycardia on EKG, acute pancreatitis on CT, elevated creatinine, elevated lactic acid "  The chronic co-morbidities include  chronic respiratory failure on 2 L, interstitial lung disease, type 1 diabetes     * I certify that at the point of admission it is my clinical judgment that the patient will require inpatient hospital care spanning beyond 2 midnights from the point of admission due to high intensity of service, high risk for further deterioration and high frequency of surveillance required.Kathie Dike MD [facility]   If 7PM-7AM, please contact night-coverage [email or web address]   12/27/2019, 1:37 PM

## 2019-12-27 NOTE — Progress Notes (Signed)
PROGRESS NOTE    [name]  XBD:[id number] DOB: 30-Nov-1975 DOA: 12/26/2019 PCP: Jani Gravel, MD    Brief Narrative:  HPI: [name] is a 44 y.o. 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 He has had associated vomiting. He has not had any fever. Abdominal pain is generalized throughout his abdomen. His p.o. intake has been poor. He has not had any diarrhea or dysuria. He has noted some worsening in his shortness of breath, but he feels it is related to splinting from abdominal pain. He has not had any change in his chronic cough. He has not had any chest pain, or noticed any palpitations.  ED Course: In the emergency room he was noted to be hypotensive on arrival which responded to IV fluids. Creatinine noted to be elevated. Lactic acid also elevated which is improved with hydration, chest x-ray did not show any acute findings. CT abdomen showed evidence of acute pancreatitis. EKG indicated atrial fibrillation with rapid regular response with heart rate in the 160s to 180s. He was started on Cardizem infusion. Blood sugar also noted to be elevated over 500 without evidence of acidosis.    Assessment & Plan:   Active Problems:   Pancreatitis, acute   Bipolar 1 disorder, mixed, moderate (HCC)   Uncontrolled type 1 diabetes mellitus ([place])   ILD (interstitial lung disease) ([employer])   Chronic respiratory failure with hypoxia (HCC)   Atrial fibrillation with RVR (HCC)   AKI (acute kidney injury) ([place])   HTN (hypertension)   HLD (hyperlipidemia)   1. Atrial fibrillation with rapid ventricular response. New onset. Suspect this is been precipitated by underlying pancreatitis/dehydration. He was started on a Cardizem infusion which has been  titrated up to 15 mg/h.  He was also being treated with IV Lopressor.  Patient has since converted back to sinus rhythm.  Appreciate cardiology input.  Will transition the patient to oral Toprol which will be titrated up as Cardizem infusion is weaned off.  CHADSVASc score of 3.  Since the patient's atrial fibrillation has converted back to sinus rhythm, it was recommended that anticoagulation be discontinued for now.  If A. fib recurs, anticoagulation can certainly be resumed.  Echocardiogram is in process 2. Acute pancreatitis. Although lipase is normal, he does have CT evidence of pancreatitis and clinically he has abdominal pain and vomiting. He reports that his abdominal pain improved after receiving fentanyl in the emergency room. At home he was using Tylenol for pain management.  Patient has been tolerating a liquid diet without worsening pain or vomiting.  We will continue to advance to heart healthy., continue IV fluids and pain management.  Statin discontinued since this may been contributing to pancreatitis.  Patient does not have a history of alcohol use.  He is already status post cholecystectomy and LFTs were unremarkable.  Can likely follow-up with GI for further work-up of pancreatitis 3. Acute kidney injury. Likely prerenal related to poor p.o. intake. CT imaging did not indicate any hydronephrosis. Will hold home dose of lisinopril. Continue IV fluids and monitor urine output.  Overall renal function is improving 4. Type 1 diabetes. Patient is followed by endocrinology. He takes NPH insulin 200 to 220 units once a day.  Currently on Lantus and sliding scale insulin with stable  blood sugars 5. Chronic respiratory failure with hypoxia due to interstitial lung disease. He is chronically on 2 L. Respiratory status appears to be near baseline. Chest x-ray did not show any acute findings. Continue inhaled steroids and bronchodilators as needed. 6. Hypertension. Chronically on lisinopril and  amlodipine. Will hold for now to allow for heart rate control. 7. Bipolar disorder. Appears stable at this time. Continue home medications. 8. HLD.  Statin on hold 9. GERD. Continue PPI   DVT prophylaxis: Lovenox  Code Status: Full code Family Communication: Discussed with patient Disposition Plan: Status is: Inpatient  Remains inpatient appropriate because:IV treatments appropriate due to intensity of illness or inability to take PO.  Patient remains on diltiazem infusion for management of atrial flutter   Dispo: The patient is from: Home              Anticipated d/c is to: Home              Anticipated d/c date is: 2 days              Patient currently is not medically stable to d/c.   Consultants:   Cardiology  Procedures:   Echo  Antimicrobials:       Subjective: Abdominal pain is better today.  Tolerating clear liquids.  No vomiting.  No chest pain.  Continues to have some shortness of breath.  Objective: Vitals:   12/27/19 1000 12/27/19 1100 12/27/19 1124 12/27/19 1200  BP: 112/70 107/84  107/66  Pulse: 102 105 105 (!) 107  Resp: (!) 25 22 19 23   Temp:   99.7 F (37.6 C)   TempSrc:   Oral   SpO2: 95% 94% 96% 97%  Weight:      Height:        Intake/Output Summary (Last 24 hours) at 12/27/2019 1259 Last data filed at 12/27/2019 1251 Gross per 24 hour  Intake 3005.3 ml  Output 600 ml  Net 2405.3 ml   Filed Weights   12/26/19 0409 12/26/19 2254  Weight: (!) 107 kg (!) 107.8 kg    Examination:  General exam: Appears calm and comfortable  Respiratory system: Clear to auscultation. Respiratory effort normal. Cardiovascular system: S1 & S2 heard, RRR. No JVD, murmurs, rubs, gallops or clicks. No pedal edema. Gastrointestinal system: Abdomen is nondistended, soft and nontender. No organomegaly or masses felt. Normal bowel sounds heard. Central nervous system: Alert and oriented. No focal neurological deficits. Extremities: Symmetric 5 x 5 power. Skin:  No rashes, lesions or ulcers Psychiatry: Judgement and insight appear normal. Mood & affect appropriate.     Data Reviewed: I have personally reviewed following labs and imaging studies  CBC: Recent Labs  Lab 12/26/19 0427 12/27/19 0209  WBC 20.4* 13.6*  NEUTROABS 17.3*  --   HGB 14.9 11.2*  HCT 45.1 35.4*  MCV 89.1 93.2  PLT 295 409   Basic Metabolic Panel: Recent Labs  Lab 12/26/19 0427 12/27/19 0209  NA 124* 129*  K 5.1 4.2  CL 85* 93*  CO2 24 29  GLUCOSE 530* 242*  BUN 63* 45*  CREATININE 2.29* 1.39*  CALCIUM 8.8* 8.0*   GFR: Estimated Creatinine Clearance: 89.2 mL/min (A) (by C-G formula based on SCr of 1.39 mg/dL (H)). Liver Function Tests: Recent Labs  Lab 12/26/19 0427 12/27/19 0209  AST 16 30  ALT 16 22  ALKPHOS 138* 148*  BILITOT 0.6 0.3  PROT 8.0 6.4*  ALBUMIN 2.5* 2.1*   Recent Labs  Lab 12/26/19 [phone]  LIPASE 47   No results for input(s): AMMONIA in the last 168 hours. Coagulation Profile: No results for input(s): INR, PROTIME in the last 168 hours. Cardiac Enzymes: No results for input(s): CKTOTAL, CKMB, CKMBINDEX, TROPONINI in the last 168 hours. BNP (last 3 results) No results for input(s): PROBNP in the last 8760 hours. HbA1C: No results for input(s): HGBA1C in the last 72 hours. CBG: Recent Labs  Lab 12/26/19 1819 12/26/19 2214 12/26/19 2304 12/27/19 0727 12/27/19 1123  GLUCAP 202* 278* 267* 192* 135*   Lipid Profile: No results for input(s): CHOL, HDL, LDLCALC, TRIG, CHOLHDL, LDLDIRECT in the last 72 hours. Thyroid Function Tests: Recent Labs    12/26/19 0936  TSH 1.111   Anemia Panel: No results for input(s): VITAMINB12, FOLATE, FERRITIN, TIBC, IRON, RETICCTPCT in the last 72 hours. Sepsis Labs: Recent Labs  Lab 12/26/19 0502 12/26/19 0653  LATICACIDVEN 2.0* 1.4    Recent Results (from the past 240 hour(s))  SARS Coronavirus 2 by RT PCR (hospital order, performed in [facility] hospital lab) Nasopharyngeal  Nasopharyngeal Swab     Status: None   Collection Time: 12/26/19  6:32 AM   Specimen: Nasopharyngeal Swab  Result Value Ref Range Status   SARS Coronavirus 2 NEGATIVE NEGATIVE Final    Comment: (NOTE) SARS-CoV-2 target nucleic acids are NOT DETECTED.  The SARS-CoV-2 RNA is generally detectable in upper and lower respiratory specimens during the acute phase of infection. The lowest concentration of SARS-CoV-2 viral copies this assay can detect is 250 copies / mL. A negative result does not preclude SARS-CoV-2 infection and should not be used as the sole basis for treatment or other patient management decisions.  A negative result may occur with improper specimen collection / handling, submission of specimen other than nasopharyngeal swab, presence of viral mutation(s) within the areas targeted by this assay, and inadequate number of viral copies (<250 copies / mL). A negative result must be combined with clinical observations, patient history, and epidemiological information.  Fact Sheet for Patients:   StrictlyIdeas.no  Fact Sheet for Healthcare Providers: BankingDealers.co.za  This test is not yet approved or  cleared by the [place] FDA and has been authorized for detection and/or diagnosis of SARS-CoV-2 by FDA under an Emergency Use Authorization (EUA).  This EUA will remain in effect (meaning this test can be used) for the duration of the COVID-19 declaration under Section 564(b)(1) of the Act, 21 U.S.C. section 360bbb-3(b)(1), unless the authorization is terminated or revoked sooner.  Performed at [facility], [address] [place], [place] [address]   Blood culture (routine x 2)     Status: None (Preliminary result)   Collection Time: 12/26/19  6:48 AM   Specimen: BLOOD  Result Value Ref Range Status   Specimen Description BLOOD LEFT HAND  Final   Special Requests   Final    BOTTLES DRAWN AEROBIC ONLY Blood Culture  results may not be optimal due to an inadequate volume of blood received in culture bottles   Culture   Final    NO GROWTH <12 HOURS Performed at [facility], [address], [place], [place] [address]    Report Status PENDING  Incomplete  Blood culture (routine x 2)     Status: None (Preliminary result)   Collection Time: 12/26/19  7:00 AM   Specimen: BLOOD  Result Value Ref Range Status   Specimen Description BLOOD RIGHT HAND  Final   Special Requests   Final    IN BOTH AEROBIC  AND ANAEROBIC BOTTLES Blood Culture results may not be optimal due to an inadequate volume of blood received in culture bottles   Culture   Final    NO GROWTH <12 HOURS Performed at [facility], [address] [place], [place] [address]    Report Status PENDING  Incomplete  MRSA PCR Screening     Status: None   Collection Time: 12/26/19 10:51 PM   Specimen: Nasal Mucosa; Nasopharyngeal  Result Value Ref Range Status   MRSA by PCR NEGATIVE NEGATIVE Final    Comment:        The GeneXpert MRSA Assay (FDA approved for NASAL specimens only), is one component of a comprehensive MRSA colonization surveillance program. It is not intended to diagnose MRSA infection nor to guide or monitor treatment for MRSA infections. Performed at [facility], [address], [place], [place] [address]          Radiology Studies: CT ABDOMEN PELVIS WO CONTRAST  Result Date: 12/26/2019 CLINICAL DATA:  Acute nonlocalized abdominal pain EXAM: CT ABDOMEN AND PELVIS WITHOUT CONTRAST TECHNIQUE: Multidetector CT imaging of the abdomen and pelvis was performed following the standard protocol without IV contrast. COMPARISON:  05/29/2013 FINDINGS: Lower chest: Bilateral pulmonary reticulation that is chronic when compared with the 2019 chest CT. 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 Premature coronary atherosclerotic calcification. Hepatobiliary: Marked fatty infiltration of the liver.Cholecystectomy in the interim. Pancreas:  Peripancreatic edema with diffuse pancreatic indistinctness and expansion. Spleen: Unremarkable. Adrenals/Urinary Tract: Negative adrenals. No hydronephrosis or stone. Unremarkable bladder. Stomach/Bowel:  No obstruction. No visible bowel inflammation. Vascular/Lymphatic: No acute vascular abnormality. Diffuse atherosclerotic calcification of the aorta and iliacs. No mass or adenopathy. Reproductive:Negative. Other: Small volume ascites. Musculoskeletal: No acute abnormalities. IMPRESSION: 1. Acute pancreatitis. 2. Hepatic steatosis. 3. Premature atherosclerotic calcification of the aorta and coronaries. 4. Chronic lung disease with history of the eosinophilic pneumonia. Electronically Signed   By: Monte Fantasia M.D.   On: 12/26/2019 06:58   DG Chest Port 1 View  Result Date: 12/26/2019 CLINICAL DATA:  Shortness of breath. Abdominal pain since Thursday which is cramping EXAM: PORTABLE CHEST 1 VIEW COMPARISON:  01/04/2019 FINDINGS: Chronic lung disease with patchy bilateral pulmonary opacity that is unchanged. Normal heart size and mediastinal contours. No visible effusion or pneumothorax. IMPRESSION: Chronic lung disease without acute superimposed finding when compared with prior. Electronically Signed   By: Monte Fantasia M.D.   On: 12/26/2019 05:10        Scheduled Meds: . aspirin EC  81 mg Oral Daily  . budesonide (PULMICORT) nebulizer solution  0.25 mg Nebulization BID  . Chlorhexidine Gluconate Cloth  6 each Topical Daily  . darifenacin  7.5 mg Oral QHS  . divalproex  1,000 mg Oral QHS  . insulin aspart  0-15 Units Subcutaneous TID WC  . insulin aspart  0-5 Units Subcutaneous QHS  . insulin glargine  40 Units Subcutaneous Daily  . ipratropium-albuterol  3 mL Nebulization Q6H  . metoprolol succinate  25 mg Oral Daily  . nicotine  21 mg Transdermal Daily  . pantoprazole  40 mg Oral QHS  . risperidone  4 mg Oral QHS   Continuous Infusions: . diltiazem (CARDIZEM) infusion Stopped  (12/27/19 1150)  . lactated ringers 125 mL/hr at 12/27/19 1251     LOS: 1 day    Critical care procedure note Authorized and performed by: Kathie Dike Total critical care time: Approximately 35 minutes Due to high probability of clinically significant, life-threatening deterioration, the patient required  my highest level of preparedness to intervene emergently and I personally spent this critical care time directly and personally managing the patient.  The critical care time included obtaining a history, examining the patient, pulse oximetry, ordering and review of studies, arranging urgent treatment with development of a management plan, evaluation of patient's response to treatment, frequent reassessment, discussions with other providers.  Critical care time was performed to assess and manage the high probability of imminent, life-threatening deterioration that could result in multiorgan failure.  It was exclusive of separate billable procedures and treating other patients and teaching time.  Please see MDM section and the rest of the of note for further information on patient assessment and treatment     Kathie Dike, MD [facility]   If 7PM-7AM, please contact night-coverage [email or web address]  12/27/2019, 12:59 PM

## 2019-12-27 NOTE — Progress Notes (Addendum)
Inpatient Diabetes Program Recommendations  AACE/ADA: New Consensus Statement on Inpatient Glycemic Control   Target Ranges:  Prepandial:   less than 140 mg/dL      Peak postprandial:   less than 180 mg/dL (1-2 hours)      Critically ill patients:  140 - 180 mg/dL  Results for TULLY, MCINTURFF (MRN [id number]) as of 12/27/2019 07:23  Ref. Range 12/27/2019 02:09  Glucose Latest Ref Range: 70 - 99 mg/dL 242 (H)   Results for CLEMMIE, MARXEN (MRN [id number]) as of 12/27/2019 07:23  Ref. Range 12/26/2019 04:12 12/26/2019 07:36 12/26/2019 10:13 12/26/2019 11:39 12/26/2019 16:29 12/26/2019 16:59 12/26/2019 17:18 12/26/2019 18:19 12/26/2019 22:14 12/26/2019 23:04  Glucose-Capillary Latest Ref Range: 70 - 99 mg/dL 485 (H) 471 (H) 395 (H) 320 (H) 68 (L) 63 (L) 62 (L) 202 (H) 278 (H) 267 (H)   Results for BARRET, ESQUIVEL (MRN [id number]) as of 12/27/2019 07:23  Ref. Range 12/26/2019 04:27  Glucose Latest Ref Range: 70 - 99 mg/dL [address] [facility])  Results for HARROL, NOVELLO (MRN [id number]) as of 12/27/2019 07:23  Ref. Range 08/09/2019 13:08 11/01/2019 13:11  Hemoglobin A1C Latest Ref Range: 4.0 - 5.6 % 12.7 (A) 9.3 (A)   Review of Glycemic Control  Outpatient Diabetes medications: Humulin N 200 units QAM Current orders for Inpatient glycemic control: Lantus 40 units daily, Novolog 0-15 units TID with meals, Novolog 0-5 units QHS  Inpatient Diabetes Program Recommendations:    Insulin-Please consider increasing Lantus to 45 units daily.  HbgA1C:  A1C 9.3% on 11/01/2019 indicating an average glucose of  220 mg/dl over the past 2-3 months. At time of discharge, would recommend switching patient to an insulin regimen similar to being used inpatient if glucose is fairly well controlled. Patient is open to taking different insulins (as long as covered by insurance) and taking multiple insulin injections per day.  NOTE: Patient admitted with pancreatitis, a-fib w/RVR, and acute kidney injury. In reviewing the  chart noted patient sees Dr. Loanne Drilling (last seen 11/01/2019) and is taking NPH 210 units once a day. Per Dr. Cordelia Pen office note on 11/01/19 patient is on this regimen due to noncompliance with taking multiple injections per day. Will plan to talk with patient today.  Addendum 12/27/19@9 :30-Spoke with patient over the phone about diabetes and home regimen for diabetes control. Patient reports being followed by Dr. Tenna Delaine for diabetes management. Patient reports that he would prefer to see a local Endocrinologist if there is one locally. Informed patient that Dr. Dorris Fetch is Endocrinologist in [place]. Patient states that he would like to establish care with Dr. Dorris Fetch so that he would not have to travel to [facility] to see the doctor. Encouraged patient to check with current insurance plan and see if he would need a referral if he is interested in establishing care with a new local Endocrinologist.  Patient reports that he is taking Humulin N 200 units QAM after breakfast.  Patient states that his glucose usually ranges from 100's to 300's mg/dl. Patient states that he has not checked glucose over the past 1-2 weeks due to hands shaking and not feeling well due to pancreatitis.  Patient also notes that he frequently has symptoms of hypoglycemia (dizzy, shaky) but he has not checked glucose during those times so he is not certain if symptoms are related to glucose.  Discussed importance of checking glucose consistently and especially checking if he has symptoms of hypoglycemia to determine if he is actually experiencing hypoglycemia. Discussed that if  he is experiencing hypoglycemia frequently, he likely needs adjustments with insulin regimen.  Discussed A1C results (9.3% on 11/01/19) and explained that current A1C indicates an average glucose of 220 mg/dl over the past 2-3 months. Discussed glucose and A1C goals. Discussed importance of checking CBGs and maintaining good CBG control to prevent long-term and short-term  complications. Explained how hyperglycemia leads to damage within blood vessels which lead to the common complications seen with uncontrolled diabetes. Stressed to the patient the importance of improving glycemic control to prevent further complications from uncontrolled diabetes. Discussed impact of nutrition, exercise, stress, sickness, and medications on diabetes control. Discussed how Humulin N works and explained that he may benefit from having a basal insulin that works for 24 hours and/or taking Humulin N BID.  Discussed Lantus and Novolog insulin and current insulin regimen ordered. Patient states that he is open to using other insulins and taking multiple insulin injections per day if needed. Patient would like to see if his insurance would cover other insulins such as Lantus and Novolog that are currently ordered. Will consult TOC to see if they can do a benefit check to see which insulins would be covered under current insurance plan. Also discussed FreeStyle Libre and how it would provide more information on glucose control for patient and provider. Encouraged patient to talk with PCP or Endocrinologist about prescribing FreeStyle Libre if interested. Patient appreciative of information discussed and states that he has no questions at this time related to DM. At time of discharge, would recommend switching patient to an insulin regimen similar to being used inpatient if glucose is fairly well controlled. Patient is open to taking different insulins and taking multiple insulin injections per day.  Thanks, Barnie Alderman, RN, MSN, CDE Diabetes Coordinator Inpatient Diabetes Program [phone] (Team Pager from 8am to 5pm)

## 2019-12-27 NOTE — Progress Notes (Signed)
*  PRELIMINARY RESULTS* Echocardiogram 2D Echocardiogram has been performed.  [name] 12/27/2019, 11:30 AM

## 2019-12-27 NOTE — Progress Notes (Signed)
ANTICOAGULATION CONSULT NOTE  Pharmacy Consult for Heparin Indication: atrial fibrillation  No Known Allergies  Patient Measurements: Height: 6\' 1"  (185.4 cm) Weight: (!) 107.8 kg (237 lb 10.5 oz) IBW/kg (Calculated) : 79.9 HEPARIN DW (KG): 102  Vital Signs: Temp: 98.5 F (36.9 C) (07/26 2210) Temp Source: Oral (07/26 2210) BP: 150/112 (07/27 0006) Pulse Rate: 54 (07/27 0030)  Labs: Recent Labs    12/26/19 0402 12/26/19 0427 12/26/19 1701 12/27/19 0209  HGB  --  14.9  --  11.2*  HCT  --  45.1  --  35.4*  PLT  --  295  --  212  APTT 32  --   --   --   HEPARINUNFRC  --   --  <0.10* 0.13*  CREATININE  --  2.29*  --   --     Estimated Creatinine Clearance: 54.1 mL/min (A) (by C-G formula based on SCr of 2.29 mg/dL (H)).  Assessment: 44 y.o. male presenting with suspected pancreatitis and found to have new onset atrial fibrillation, CHADSVASC 3 . Pharmacy has been consulted for IV heparin dosing.   Heparin level 0.13 units/hr.  No issues noted with infusion.  No bleeding noted.  Hg 11.2, PTLC 212  Goal of Therapy:  Heparin level 0.3-0.7 units/ml Monitor platelets by anticoagulation protocol: Yes  Plan:  Heparin bolus of 3000 units x 1 Increase heparin infusion to 2000 units/hr  Check 6h heparin level  Monitor CBC, daily heparin level  Continue to monitor for signs/symptoms of bleeding   Thanks for allowing pharmacy to be a part of this patient's care.  Excell Seltzer, PharmD Clinical Pharmacist  12/27/2019   3:52 AM   Please check AMION for all [place] phone numbers After 10:00 PM, call the [place] [phone]

## 2019-12-27 NOTE — Progress Notes (Signed)
Progress Note  Patient Name: [name] Date of Encounter: 12/27/2019  Primary Cardiologist: Rozann Lesches, MD  Subjective   Some improvement in abdominal pain.  No chest pain or palpitations.  No nausea or emesis.  Inpatient Medications    Scheduled Meds: . aspirin EC  81 mg Oral Daily  . budesonide (PULMICORT) nebulizer solution  0.25 mg Nebulization BID  . Chlorhexidine Gluconate Cloth  6 each Topical Daily  . darifenacin  7.5 mg Oral QHS  . diltiazem  60 mg Oral Q6H  . divalproex  1,000 mg Oral QHS  . insulin aspart  0-15 Units Subcutaneous TID WC  . insulin aspart  0-5 Units Subcutaneous QHS  . insulin glargine  40 Units Subcutaneous Daily  . ipratropium-albuterol  3 mL Nebulization Q6H  . metoprolol tartrate  5 mg Intravenous Q6H  . nicotine  21 mg Transdermal Daily  . pantoprazole  40 mg Oral QHS  . risperidone  4 mg Oral QHS   Continuous Infusions: . diltiazem (CARDIZEM) infusion 15 mg/hr (12/27/19 0801)  . lactated ringers 125 mL/hr at 12/27/19 0801   PRN Meds: acetaminophen **OR** acetaminophen, HYDROcodone-acetaminophen, levalbuterol, ondansetron **OR** ondansetron (ZOFRAN) IV   Vital Signs    Vitals:   12/27/19 0500 12/27/19 0600 12/27/19 0700 12/27/19 0728  BP: 127/82 119/69 113/68   Pulse: 100 100 100 (!) 106  Resp: 23 19 (!) 27 (!) 27  Temp:    99 F (37.2 C)  TempSrc:    Oral  SpO2: 97% 98%  96%  Weight:      Height:        Intake/Output Summary (Last 24 hours) at 12/27/2019 0933 Last data filed at 12/27/2019 0806 Gross per 24 hour  Intake 3748.88 ml  Output 650 ml  Net 3098.88 ml   Filed Weights   12/26/19 0409 12/26/19 2254  Weight: (!) 107 kg (!) 107.8 kg    Telemetry    Sinus tachycardia.  Personally reviewed.  Physical Exam   GEN: No acute distress.   Neck: No JVD. Cardiac: RRR, no murmur, rub, or gallop.  Respiratory:  Coarse breath sounds with end expiratory wheeze. GI: Soft, tender to palpation, bowel sounds  present.  No guarding. MS: No edema; No deformity. Neuro:  Nonfocal. Psych: Alert and oriented x 3. Normal affect.  Labs    Chemistry Recent Labs  Lab 12/26/19 0427 12/27/19 0209  NA 124* 129*  K 5.1 4.2  CL 85* 93*  CO2 24 29  GLUCOSE 530* 242*  BUN 63* 45*  CREATININE 2.29* 1.39*  CALCIUM 8.8* 8.0*  PROT 8.0 6.4*  ALBUMIN 2.5* 2.1*  AST 16 30  ALT 16 22  ALKPHOS 138* 148*  BILITOT 0.6 0.3  GFRNONAA 34* >60  GFRAA 39* >60  ANIONGAP 15 7     Hematology Recent Labs  Lab 12/26/19 0427 12/27/19 0209  WBC 20.4* 13.6*  RBC 5.06 3.80*  HGB 14.9 11.2*  HCT 45.1 35.4*  MCV 89.1 93.2  MCH 29.4 29.5  MCHC 33.0 31.6  RDW 14.7 15.0  PLT 295 212    Radiology    CT ABDOMEN PELVIS WO CONTRAST  Result Date: 12/26/2019 CLINICAL DATA:  Acute nonlocalized abdominal pain EXAM: CT ABDOMEN AND PELVIS WITHOUT CONTRAST TECHNIQUE: Multidetector CT imaging of the abdomen and pelvis was performed following the standard protocol without IV contrast. COMPARISON:  05/29/2013 FINDINGS: Lower chest: Bilateral pulmonary reticulation that is chronic when compared with the 2019 chest CT. 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 Premature coronary atherosclerotic calcification. Hepatobiliary: Marked fatty infiltration of the liver.Cholecystectomy in the interim. Pancreas: Peripancreatic edema with diffuse pancreatic indistinctness and expansion. Spleen: Unremarkable. Adrenals/Urinary Tract: Negative adrenals. No hydronephrosis or stone. Unremarkable bladder. Stomach/Bowel:  No obstruction. No visible bowel inflammation. Vascular/Lymphatic: No acute vascular abnormality. Diffuse atherosclerotic calcification of the aorta and iliacs. No mass or adenopathy. Reproductive:Negative. Other: Small volume ascites. Musculoskeletal: No acute abnormalities. IMPRESSION: 1. Acute pancreatitis. 2. Hepatic steatosis. 3. Premature atherosclerotic calcification of the aorta and coronaries. 4. Chronic lung disease  with history of the eosinophilic pneumonia. Electronically Signed   By: Monte Fantasia M.D.   On: 12/26/2019 06:58   DG Chest Port 1 View  Result Date: 12/26/2019 CLINICAL DATA:  Shortness of breath. Abdominal pain since Thursday which is cramping EXAM: PORTABLE CHEST 1 VIEW COMPARISON:  01/04/2019 FINDINGS: Chronic lung disease with patchy bilateral pulmonary opacity that is unchanged. Normal heart size and mediastinal contours. No visible effusion or pneumothorax. IMPRESSION: Chronic lung disease without acute superimposed finding when compared with prior. Electronically Signed   By: Monte Fantasia M.D.   On: 12/26/2019 05:10    Cardiac Studies   Echocardiogram 03/03/2019: 1. Left ventricular ejection fraction, by visual estimation, is 50 to  55%. The left ventricle has normal function. There is moderately increased  left ventricular hypertrophy.  2. Indeterminate diastolic filling due to E-A fusion pattern of LV  diastolic filling.  3. Global right ventricle has normal systolic function.The right  ventricular size is normal. No increase in right ventricular wall  thickness.  4. Left atrial size was normal.  5. Right atrial size was normal.  6. The mitral valve is normal in structure. No evidence of mitral valve  regurgitation. No evidence of mitral stenosis.  7. The tricuspid valve is normal in structure. Tricuspid valve  regurgitation is trivial.  8. The aortic valve is tricuspid Aortic valve regurgitation was not  visualized by color flow Doppler. Structurally normal aortic valve, with  no evidence of sclerosis or stenosis.  9. The pulmonic valve was not well visualized. Pulmonic valve  regurgitation is not visualized by color flow Doppler.  10. Mildly elevated pulmonary artery systolic pressure.  11. The inferior vena cava is normal in size with greater than 50%  respiratory variability, suggesting right atrial pressure of 3 mmHg.   Patient Profile     44 y.o. male  with a history of interstitial lung disease and pulmonary eosinophilia with chronic hypoxic respiratory failure, tobacco use, type 2 diabetes mellitus, hypertension, and GERD now presenting with acute pancreatitis and newly documented rapid atrial fibrillation.  Assessment & Plan    1.  Atrial fibrillation with RVR, spontaneously converted to sinus rhythm/sinus tachycardia.  Potentially associated with acute illness, not entirely clear at this point whether we will pursue long-term anticoagulation.  His CHA2DS2-VASc score is 3.  He has been on IV diltiazem in divided dose IV Lopressor.  2.  Acute pancreatitis.  3.  Acute kidney injury in the setting of above, creatinine 2.29 down to 1.39.  ACE inhibitor was discontinued.  4.  Atherosclerotic calcification of the aorta and coronaries by CT imaging.  He had been on statin therapy, this was discontinued in case any potential association with pancreatitis.  Discussed with hospitalist team.  Would go ahead and stop heparin at this point.  Depending on ability to take oral medications, can begin to transition to divided dose Cardizem or back to oral beta-blocker if possible - may only need one  going forward.  Echocardiogram will be obtained to reevaluate LVEF, previously 50 to 55% as of October 2020.  Signed, Rozann Lesches, MD  12/27/2019, 9:33 AM

## 2019-12-28 DIAGNOSIS — I4891 Unspecified atrial fibrillation: Secondary | ICD-10-CM | POA: Diagnosis not present

## 2019-12-28 DIAGNOSIS — F17200 Nicotine dependence, unspecified, uncomplicated: Secondary | ICD-10-CM | POA: Diagnosis present

## 2019-12-28 LAB — COMPREHENSIVE METABOLIC PANEL
ALT: 25 U/L (ref 0–44)
AST: 37 U/L (ref 15–41)
Albumin: 1.9 g/dL — ABNORMAL LOW (ref 3.5–5.0)
Alkaline Phosphatase: 210 U/L — ABNORMAL HIGH (ref 38–126)
Anion gap: 10 (ref 5–15)
BUN: 23 mg/dL — ABNORMAL HIGH (ref 6–20)
CO2: 28 mmol/L (ref 22–32)
Calcium: 8.2 mg/dL — ABNORMAL LOW (ref 8.9–10.3)
Chloride: 95 mmol/L — ABNORMAL LOW (ref 98–111)
Creatinine, Ser: 0.93 mg/dL (ref 0.61–1.24)
GFR calc Af Amer: >60 mL/min (ref >60)
GFR calc non Af Amer: >60 mL/min (ref >60)
Glucose, Bld: 140 mg/dL — ABNORMAL HIGH (ref 70–99)
Potassium: 3.8 mmol/L (ref 3.5–5.1)
Sodium: 133 mmol/L — ABNORMAL LOW (ref 135–145)
Total Bilirubin: 0.5 mg/dL (ref 0.3–1.2)
Total Protein: 6.2 g/dL — ABNORMAL LOW (ref 6.5–8.1)

## 2019-12-28 LAB — CBC
HCT: 36 % — ABNORMAL LOW (ref 39.0–52.0)
Hemoglobin: 11.1 g/dL — ABNORMAL LOW (ref 13.0–17.0)
MCH: 28.7 pg (ref 26.0–34.0)
MCHC: 30.8 g/dL (ref 30.0–36.0)
MCV: 93 fL (ref 80.0–100.0)
Platelets: 173 10*3/uL (ref 150–400)
RBC: 3.87 MIL/uL — ABNORMAL LOW (ref 4.22–5.81)
RDW: 15.3 % (ref 11.5–15.5)
WBC: 12.9 10*3/uL — ABNORMAL HIGH (ref 4.0–10.5)
nRBC: 0 % (ref 0.0–0.2)

## 2019-12-28 LAB — GLUCOSE, CAPILLARY
Glucose-Capillary: 162 mg/dL — ABNORMAL HIGH (ref 70–99)
Glucose-Capillary: 154 mg/dL — ABNORMAL HIGH (ref 70–99)

## 2019-12-28 MED ORDER — ACETAMINOPHEN 325 MG PO TABS: 650.0000 mg | ORAL_TABLET | Freq: Four times a day (QID) | ORAL | 0 refills | Status: DC | PRN

## 2019-12-28 MED ORDER — METOPROLOL SUCCINATE ER 50 MG PO TB24
50.0000 mg | ORAL_TABLET | Freq: Every day | ORAL | 5 refills | Status: DC
Start: 2019-12-28 — End: 2020-03-27

## 2019-12-28 MED ORDER — SYMBICORT 160-4.5 MCG/ACT IN AERO: 2.0000 | INHALATION_SPRAY | Freq: Two times a day (BID) | RESPIRATORY_TRACT | 12 refills | Status: AC

## 2019-12-28 MED ORDER — METOPROLOL TARTRATE 50 MG PO TABS
50.0000 mg | ORAL_TABLET | Freq: Two times a day (BID) | ORAL | Status: DC
  Administered 2019-12-28: 50 mg via ORAL
  Filled 2019-12-28: qty 1

## 2019-12-28 MED ORDER — INSULIN ASPART 100 UNIT/ML FLEXPEN: 3.0000 [IU] | PEN_INJECTOR | Freq: Three times a day (TID) | SUBCUTANEOUS | 11 refills | Status: DC

## 2019-12-28 MED ORDER — "SURE COMFORT INSULIN SYRINGE 30G X 1/2"" 1 ML MISC": 0 refills | Status: AC

## 2019-12-28 MED ORDER — ASPIRIN 81 MG PO TBEC: 81.0000 mg | DELAYED_RELEASE_TABLET | Freq: Every day | ORAL | 11 refills | Status: DC

## 2019-12-28 MED ORDER — LISINOPRIL 10 MG PO TABS: 10.0000 mg | ORAL_TABLET | Freq: Every day | ORAL | 5 refills | Status: DC

## 2019-12-28 MED ORDER — FUROSEMIDE 20 MG PO TABS: 20.0000 mg | ORAL_TABLET | ORAL | 0 refills | Status: DC | PRN

## 2019-12-28 MED ORDER — DIVALPROEX SODIUM 500 MG PO DR TAB: 1000.0000 mg | DELAYED_RELEASE_TABLET | Freq: Every day | ORAL | 5 refills | Status: AC

## 2019-12-28 MED ORDER — INSULIN GLARGINE 100 UNIT/ML ~~LOC~~ SOLN: 40.0000 [IU] | Freq: Every day | SUBCUTANEOUS | 11 refills | Status: DC

## 2019-12-28 MED ORDER — ALBUTEROL SULFATE HFA 108 (90 BASE) MCG/ACT IN AERS: 2.0000 | INHALATION_SPRAY | Freq: Four times a day (QID) | RESPIRATORY_TRACT | 0 refills | Status: AC | PRN

## 2019-12-28 MED ORDER — ONETOUCH VERIO VI STRP
1.0000 | ORAL_STRIP | Freq: Two times a day (BID) | 12 refills | Status: DC
Start: 2019-12-28 — End: 2021-02-05

## 2019-12-28 NOTE — Discharge Instructions (Signed)
A)As you requested, local Endocrinologist in [place], [place] is Dr. Glade Lloyd. Address is [address], [place], [place] [address]. Phone number is [phone].  B)[facility] Follow-up will be on 01/09/2020 at 2:00 in our [place] office with Katina Dung, NP (works with Dr. Domenic Polite).   -1) please take Lantus insulin 40 units at bedtime 2) please take NovoLog insulin with meals 3 times a day as outlined below---If glucose Before meal is between 130 to 150 --take 3 units, if 151 to 180 before meals take 4 units, 181 to 210 take 6 Units, 211 to 250 -take 8 units, 251-300---take 10 units, 301 to 350--12 units, 351 to 400 -take 15 units 3) complete abstinence from tobacco/smoking advised 4) improved compliance with continuous oxygen use strongly advised 5)you need oxygen at home at 2 L via nasal cannula continuously while awake and while asleep--- smoking or having open fires around oxygen can cause fire, significant injury and death 6) please take Toprol-XL 50 mg daily to keep your heart from going too fast 7) please take lisinopril 10 mg daily for your heart and also to protect your kidneys 8) very low-salt/2 g sodium diet advised 9) please follow-up with cardiologist Dr. Domenic Polite in the clinic in a couple of weeks for recheck and reevaluation--cardiologist may decide to start you on pravastatin for cholesterol

## 2019-12-28 NOTE — Progress Notes (Signed)
Progress Note  Patient Name: [name] Date of Encounter: 12/28/2019  Primary Cardiologist: Rozann Lesches, MD  Subjective   Abdominal pain improved.  Tolerated oral intake yesterday including breakfast this morning.  Has not ambulated yet.  No chest pain or palpitations.  Inpatient Medications    Scheduled Meds: . aspirin EC  81 mg Oral Daily  . budesonide (PULMICORT) nebulizer solution  0.25 mg Nebulization BID  . Chlorhexidine Gluconate Cloth  6 each Topical Daily  . darifenacin  7.5 mg Oral QHS  . divalproex  1,000 mg Oral QHS  . enoxaparin (LOVENOX) injection  40 mg Subcutaneous Q24H  . insulin aspart  0-15 Units Subcutaneous TID WC  . insulin aspart  0-5 Units Subcutaneous QHS  . insulin glargine  40 Units Subcutaneous Daily  . ipratropium-albuterol  3 mL Nebulization Q6H  . metoprolol tartrate  50 mg Oral BID  . nicotine  21 mg Transdermal Daily  . pantoprazole  40 mg Oral QHS  . risperidone  4 mg Oral QHS   Continuous Infusions: . lactated ringers 125 mL/hr at 12/27/19 2156   PRN Meds: acetaminophen **OR** acetaminophen, HYDROcodone-acetaminophen, levalbuterol, ondansetron **OR** ondansetron (ZOFRAN) IV   Vital Signs    Vitals:   12/28/19 0400 12/28/19 0500 12/28/19 0600 12/28/19 0818  BP: 121/71  (!) 108/62   Pulse: (!) 109  (!) 110   Resp: 21  15   Temp: 98.7 F (37.1 C)     TempSrc: Oral     SpO2: 93%  95% 94%  Weight:  (!) 109.7 kg    Height:        Intake/Output Summary (Last 24 hours) at 12/28/2019 0909 Last data filed at 12/27/2019 2156 Gross per 24 hour  Intake 256.42 ml  Output 925 ml  Net -668.58 ml   Filed Weights   12/26/19 0409 12/26/19 2254 12/28/19 0500  Weight: (!) 107 kg (!) 107.8 kg (!) 109.7 kg    Telemetry    Sinus tachycardia.  Personally reviewed.  Physical Exam   GEN: No acute distress.   Neck: No JVD. Cardiac:  RRR, no significant murmur gallop.  Respiratory:  Coarse breath sounds with soft and  expiratory wheeze. GI:  Soft, bowel sounds present, no guarding. MS:  No edema. Neuro:  Nonfocal. Psych: Alert and oriented x 3. Normal affect.  Labs    Chemistry Recent Labs  Lab 12/26/19 0427 12/27/19 0209 12/28/19 0447  NA 124* 129* 133*  K 5.1 4.2 3.8  CL 85* 93* 95*  CO2 24 29 28   GLUCOSE 530* 242* 140*  BUN 63* 45* 23*  CREATININE 2.29* 1.39* 0.93  CALCIUM 8.8* 8.0* 8.2*  PROT 8.0 6.4* 6.2*  ALBUMIN 2.5* 2.1* 1.9*  AST 16 30 37  ALT 16 22 25   ALKPHOS 138* 148* 210*  BILITOT 0.6 0.3 0.5  GFRNONAA 34* >60 >60  GFRAA 39* >60 >60  ANIONGAP 15 7 10      Hematology Recent Labs  Lab 12/26/19 0427 12/27/19 0209 12/28/19 0447  WBC 20.4* 13.6* 12.9*  RBC 5.06 3.80* 3.87*  HGB 14.9 11.2* 11.1*  HCT 45.1 35.4* 36.0*  MCV 89.1 93.2 93.0  MCH 29.4 29.5 28.7  MCHC 33.0 31.6 30.8  RDW 14.7 15.0 15.3  PLT 295 212 173    Radiology    ECHOCARDIOGRAM COMPLETE  Result Date: 12/27/2019    ECHOCARDIOGRAM REPORT   Patient Name:   [name] Date of Exam: 12/27/2019 Medical Rec #:  [id number]  Height:       73.0 in Accession #:    [id number]          Weight:       237.7 lb Date of Birth:  [birth year]           BSA:          2.315 m Patient Age:    44 years            BP:           110/65 mmHg Patient Gender: M                   HR:           106 bpm. Exam Location:  [facility] Procedure: 2D Echo, Cardiac Doppler and Color Doppler Indications:    Atrial Fibrillation 427.31 / I48.91  History:        Patient has prior history of Echocardiogram examinations, most                 recent 03/03/2019. Arrythmias:Atrial Fibrillation; Risk                 Factors:Hypertension, Diabetes and Dyslipidemia. Obesity (From                 Hx), Tobacco abuse (From Hx).  Sonographer:    Alvino Chapel RCS Referring Phys: [place]  1. Left ventricular ejection fraction, by estimation, is 45 to 50%. The left ventricle has mildly decreased function. The left ventricle  demonstrates global hypokinesis. There is mild left ventricular hypertrophy. Left ventricular diastolic parameters were normal.  2. Right ventricular systolic function is normal. The right ventricular size is normal. There is normal pulmonary artery systolic pressure. The estimated right ventricular systolic pressure is 09.7 mmHg.  3. The mitral valve is grossly normal, mildly thickened. Mild mitral valve regurgitation.  4. The aortic valve is tricuspid. Aortic valve regurgitation is not visualized.  5. The inferior vena cava is dilated in size with >50% respiratory variability, suggesting right atrial pressure of 8 mmHg. FINDINGS  Left Ventricle: Left ventricular ejection fraction, by estimation, is 45 to 50%. The left ventricle has mildly decreased function. The left ventricle demonstrates global hypokinesis. The left ventricular internal cavity size was normal in size. There is  mild left ventricular hypertrophy. Left ventricular diastolic parameters were normal. Right Ventricle: The right ventricular size is normal. No increase in right ventricular wall thickness. Right ventricular systolic function is normal. There is normal pulmonary artery systolic pressure. The tricuspid regurgitant velocity is 1.69 m/s, and  with an assumed right atrial pressure of 8 mmHg, the estimated right ventricular systolic pressure is 35.3 mmHg. Left Atrium: Left atrial size was normal in size. Right Atrium: Right atrial size was normal in size. Pericardium: There is no evidence of pericardial effusion. Mitral Valve: The mitral valve is grossly normal. There is mild calcification of the mitral valve leaflet(s). Mild mitral valve regurgitation. Tricuspid Valve: The tricuspid valve is grossly normal. Tricuspid valve regurgitation is trivial. Aortic Valve: The aortic valve is tricuspid. Aortic valve regurgitation is not visualized. Pulmonic Valve: The pulmonic valve was grossly normal. Pulmonic valve regurgitation is trivial. Aorta: The  aortic root is normal in size and structure. Venous: The inferior vena cava is dilated in size with greater than 50% respiratory variability, suggesting right atrial pressure of 8 mmHg. IAS/Shunts: No atrial level shunt detected by color flow Doppler.  LEFT VENTRICLE PLAX 2D LVIDd:  5.23 cm  Diastology LVIDs:         4.02 cm  LV e' lateral:   13.30 cm/s LV PW:         1.30 cm  LV E/e' lateral: 7.3 LV IVS:        1.26 cm  LV e' medial:    10.80 cm/s LVOT diam:     2.20 cm  LV E/e' medial:  9.0 LV SV:         52 LV SV Index:   23 LVOT Area:     3.80 cm  RIGHT VENTRICLE RV S prime:     15.90 cm/s TAPSE (M-mode): 2.4 cm LEFT ATRIUM             Index       RIGHT ATRIUM           Index LA diam:        4.50 cm 1.94 cm/m  RA Area:     15.00 cm LA Vol (A2C):   42.1 ml 18.18 ml/m RA Volume:   31.60 ml  13.65 ml/m LA Vol (A4C):   55.0 ml 23.75 ml/m LA Biplane Vol: 52.3 ml 22.59 ml/m  AORTIC VALVE LVOT Vmax:   93.30 cm/s LVOT Vmean:  54.700 cm/s LVOT VTI:    0.138 m  AORTA Ao Root diam: 3.50 cm MITRAL VALVE               TRICUSPID VALVE MV Area (PHT): 4.08 cm    TR Peak grad:   11.4 mmHg MV Decel Time: 186 msec    TR Vmax:        169.00 cm/s MV E velocity: 97.30 cm/s MV A velocity: 74.60 cm/s  SHUNTS MV E/A ratio:  1.30        Systemic VTI:  0.14 m                            Systemic Diam: 2.20 cm Rozann Lesches MD Electronically signed by Rozann Lesches MD Signature Date/Time: 12/27/2019/3:43:14 PM    Final       Patient Profile     44 y.o. male with a history of interstitial lung disease and pulmonary eosinophilia with chronic hypoxic respiratory failure, tobacco use, type 2 diabetes mellitus, hypertension, and GERD now presenting with acute pancreatitis and newly documented rapid atrial fibrillation.  Assessment & Plan    1.  Atrial fibrillation with RVR, spontaneously converted to sinus rhythm/sinus tachycardia.  Potentially associated with acute illness.  His CHA2DS2-VASc score is 3.  Now back on  Toprol-XL.  2.  Acute pancreatitis.  Symptomatically improving with care per primary team.  3.  Acute kidney injury in the setting of above, creatinine 2.29 down to 0.93.  ACE inhibitor was discontinued.  4.  Atherosclerotic calcification of the aorta and coronaries by CT imaging.  He had been on statin therapy, this was discontinued in case any potential association with pancreatitis.  Discussed with Dr. Denton Brick.  Patient to increase activity today with anticipated discharge home soon.  Would increase Toprol-XL to 50 mg daily.  Might be able to go back on ACE inhibitor eventually as an outpatient if necessary for blood pressure control.  He will need follow-up with PCP and also would suggest getting him in for evaluation with [facility] for follow-up of his chronic lung disease (previously managed by Dr. Luan Pulling).  LVEF is 45 to 50% with global hypokinesis, this can be  reevaluated when he gets back to baseline, could be related to an acute illness and recent atrial fibrillation with RVR.  Do not anticipate starting long-term anticoagulation at this time.  We will plan to see him back in the office over the next few weeks.  [facility] will sign off.    Signed, Rozann Lesches, MD  12/28/2019, 9:09 AM

## 2019-12-28 NOTE — TOC Progression Note (Signed)
Transition of Care [facility]) - Progression Note    Patient Details  Name: [name] MRN: [id number] Date of Birth: 25-Nov-1975  Transition of Care [facility]) CM/SW Contact  Salome Arnt, [place] Phone Number: 12/28/2019, 9:22 AM  Clinical Narrative:   TOC received consult to complete benefits check for Lantus and Novolog. LCSW verified with [place] that pt's copay would be $3 with Medicaid. Diabetes coordinator and MD notified.          Expected Discharge Plan and Services           Expected Discharge Date: 12/28/19                                     Social Determinants of Health (SDOH) Interventions    Readmission Risk Interventions No flowsheet data found.

## 2019-12-28 NOTE — Discharge Summary (Signed)
[name] [name], is a 44 y.o. male  DOB November 01, 1975  MRN [id number].  Admission date:  12/26/2019  Admitting Physician  Kathie Dike, MD  Discharge Date:  12/28/2019   Primary MD  Jani Gravel, MD  Recommendations for primary care physician for things to follow:   A)As you requested, local Endocrinologist in [place], [place] is Dr. Glade Lloyd. Address is [address], [place], [place] [address]. Phone number is [phone].  B)Cardiology Hospital Follow-up will be on 01/09/2020 at 2:00 in our [place] office with Katina Dung, NP (works with Dr. Domenic Polite).   -1) please take Lantus insulin 40 units at bedtime 2) please take NovoLog insulin with meals 3 times a day as outlined below---If glucose Before meal is between 130 to 150 --take 3 units, if 151 to 180 before meals take 4 units, 181 to 210 take 6 Units, 211 to 250 -take 8 units, 251-300---take 10 units, 301 to 350--12 units, 351 to 400 -take 15 units 3) complete abstinence from tobacco/smoking advised 4) improved compliance with continuous oxygen use strongly advised 5)you need oxygen at home at 2 L via nasal cannula continuously while awake and while asleep--- smoking or having open fires around oxygen can cause fire, significant injury and death 6) please take Toprol-XL 50 mg daily to keep your heart from going too fast 7) please take lisinopril 10 mg daily for your heart and also to protect your kidneys 8) very low-salt/2 g sodium diet advised 9) please follow-up with cardiologist Dr. Domenic Polite in the clinic in a couple of weeks for recheck and reevaluation--cardiologist may decide to start you on pravastatin for cholesterol  Admission Diagnosis  Hyperglycemia [R73.9] Atrial fibrillation with rapid ventricular response ([place]) [I48.91] AKI (acute kidney injury) ([place]) [N17.9] Atrial fibrillation with RVR ([place]) [I48.91] Idiopathic acute pancreatitis without  infection or necrosis [K85.00]   Discharge Diagnosis  Hyperglycemia [R73.9] Atrial fibrillation with rapid ventricular response ([place]) [I48.91] AKI (acute kidney injury) ([place]) [N17.9] Atrial fibrillation with RVR ([place]) [I48.91] Idiopathic acute pancreatitis without infection or necrosis [K85.00]    Active Problems:   Uncontrolled type 1 diabetes mellitus ([place])   ILD (interstitial lung disease) ([place])   Chronic respiratory failure with hypoxia (HCC)   Atrial fibrillation with RVR (HCC)   AKI (acute kidney injury) ([place])   Bipolar 1 disorder, mixed, moderate (HCC)   Pancreatitis, acute   HTN (hypertension)   HLD (hyperlipidemia)   Tobacco abuse      Past Medical History:  Diagnosis Date  . Bipolar disorder ([place])   . Chronic respiratory failure ([place])   . Erectile dysfunction   . Essential hypertension   . GERD (gastroesophageal reflux disease)   . History of pneumonia   . Hyperlipidemia   . Interstitial lung disease (HCC)    Pulmonary eosinophilia  . Obesity   . Pulmonary eosinophilia   . Sinus tachycardia   . Tobacco abuse   . Type 2 diabetes mellitus ([place])     Past Surgical History:  Procedure Laterality Date  . APPENDECTOMY    .  CHOLECYSTECTOMY N/A 07/11/2013   Procedure: LAPAROSCOPIC CHOLECYSTECTOMY;  Surgeon: Jamesetta So, MD;  Location: [facility];  Service: General;  Laterality: N/A;  . INCISION AND DRAINAGE ABSCESS / HEMATOMA OF BURSA / KNEE / THIGH    . KNEE SURGERY Bilateral   . VIDEO BRONCHOSCOPY Bilateral 02/09/2018   Procedure: VIDEO BRONCHOSCOPY WITHOUT FLUORO;  Surgeon: Brand Males, MD;  Location: [facility];  Service: Cardiopulmonary;  Laterality: Bilateral;     HPI  from the history and physical done on the day of admission:   Chief Complaint: Abdominal pain  HPI: [name] [name] is a 44 y.o. 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 He has had associated vomiting. He has not had any fever. Abdominal pain is generalized throughout his abdomen. His p.o. intake has been poor. He has not had any diarrhea or dysuria. He has noted some worsening in his shortness of breath, but he feels it is related to splinting from abdominal pain. He has not had any change in his chronic cough. He has not had any chest pain, or noticed any palpitations.  ED Course: In the emergency room he was noted to be hypotensive on arrival which responded to IV fluids. Creatinine noted to be elevated. Lactic acid also elevated which is improved with hydration, chest x-ray did not show any acute findings. CT abdomen showed evidence of acute pancreatitis. EKG indicated atrial fibrillation with rapid regular response with heart rate in the 160s to 180s. He was started on Cardizem infusion. Blood sugar also noted to be elevated over 500 without evidence of acidosis.      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 He has had associated vomiting. He has not had any fever. Abdominal pain is generalized throughout his abdomen. His p.o. intake has been poor. He has not had any diarrhea or dysuria. He has noted some worsening in his shortness of breath, but he feels it is related to splinting from abdominal pain. He has not had any change in his chronic cough. He has not had any chest pain, or noticed any palpitations.  ED Course:In the emergency room he was noted to be hypotensive on arrival which responded to IV  fluids. Creatinine noted to be elevated. Lactic acid also elevated which is improved with hydration, chest x-ray did not show any acute findings. CT abdomen showed evidence of acute pancreatitis. EKG indicated atrial fibrillation with rapid regular response with heart rate in the 160s to 180s. He was started on Cardizem infusion. Blood sugar also noted to be elevated over 500 without evidence of acidosis.    Assessment & Plan:   Active Problems:   Pancreatitis, acute   Bipolar 1 disorder, mixed, moderate (HCC)   Uncontrolled type 1 diabetes mellitus ([place])   ILD (interstitial lung disease) ([place])   Chronic respiratory failure with hypoxia (HCC)   Atrial fibrillation with RVR (HCC)   AKI (acute kidney injury) ([place])   HTN (hypertension)   HLD (hyperlipidemia)   1)Atrial fibrillation/flutter with  rapid ventricular response. New onset. Suspect this is been precipitated by underlying pancreatitis/dehydration. He was treated with IV Cardizem and IV Lopressor, converted back to sinus rhythm -Cardiology consult appreciated CHADSVASc score of 3.  Since the patient's atrial fibrillation has converted back to sinus rhythm, it was recommended that anticoagulation be discontinued for now.  If A. fib recurs, anticoagulation can certainly be resumed.  --Echo with EF of 45 to 50%, okay to discharge on metoprolol for rate control -Outpatient follow-up with cardiologist advised  -2)Acute pancreatitis. Although lipase is normal, he does have CT evidence of pancreatitis and clinically he has abdominal pain and vomiting. He reports that his abdominal pain improved after receiving fentanyl in the emergency room. At home he was using Tylenol for pain management.   Statin discontinued since this may been contributing to pancreatitis.   -Denies excessive alcohol use , he is status post cholecystectomy and LFTs were unremarkable.   -Patient is Now tolerating oral intake well without emesis or significant abdominal  pain okay to discharge home  3)Acute kidney injury--- Likely prerenal related to poor p.o. intake. CT imaging did not indicate any hydronephrosis.  --- Renal function improved with hydration -discharge creatinine 0.9  4)Type 1 diabetes. Patient is followed by endocrinology. He takes NPH insulin 200 to 220 units once a day.  Currently on Lantus and sliding scale insulin with stable blood sugars --Outpatient follow-up with Dr. Loni Beckwith advised   5)Chronic respiratory failure with hypoxia due to interstitial lung disease. He is chronically on 2 L. Respiratory status appears to be near baseline. Chest x-ray did not show any acute findings. Continue inhaled steroids and bronchodilators as needed.  6)HTN--- metoprolol added for rate control, lisinopril was initially held due to AKI  7)Bipolar disorder. Appears stable at this time. Continue home medications.   Code Status: Full code Family Communication: Discussed with patient    Dispo: The patient is from: Home  Anticipated d/c is to: Home   Consultants:   Cardiology  Procedures:   Echo  Discharge Condition: stable  Follow UP   Follow-up Information    Rigoberto Noel, MD. Schedule an appointment as soon as possible for a visit in 2 week(s).   Specialty: Pulmonary Disease Why: ILD---- Contact information: [address]  [place] [address] [phone]               Diet and Activity recommendation:  As advised  Discharge Instructions     Discharge Instructions    (HEART FAILURE PATIENTS) Call MD:  Anytime you have any of the following symptoms: 1) 3 pound weight gain in 24 hours or 5 pounds in 1 week 2) shortness of breath, with or without a dry hacking cough 3) swelling in the hands, feet or stomach 4) if you have to sleep on extra pillows at night in order to breathe.   Complete by: As directed    Call MD for:  difficulty breathing, headache or visual disturbances    Complete by: As directed    Call MD for:  persistant dizziness or light-headedness   Complete by: As directed    Call MD for:  persistant nausea and vomiting   Complete by: As directed    Call MD for:  severe uncontrolled pain   Complete by: As directed    Call MD for:  temperature >100.4   Complete by: As directed    Diet - low sodium heart healthy   Complete by: As directed  Diet Carb Modified   Complete by: As directed    Discharge instructions   Complete by: As directed    1) please take Lantus insulin 40 units at bedtime 2) please take NovoLog insulin with meals 3 times a day as outlined below---If glucose Before meal is between 130 to 150 --take 3 units, if 151 to 180 before meals take 4 units, 181 to 210 take 6 Units, 211 to 250 -take 8 units, 251-300---take 10 units, 301 to 350--12 units, 351 to 400 -take 15 units 3) complete abstinence from tobacco/smoking advised 4) improved compliance with continuous oxygen use strongly advised 5)you need oxygen at home at 2 L via nasal cannula continuously while awake and while asleep--- smoking or having open fires around oxygen can cause fire, significant injury and death 2022/08/25) please take Toprol-XL 50 mg daily to keep your heart from going too fast 7) please take lisinopril 10 mg daily for your heart and also to protect your kidneys 8) very low-salt/2 g sodium diet advised 9) please follow-up with cardiologist Dr. Domenic Polite in the clinic in a couple of weeks for recheck and reevaluation--cardiologist may decide to start you on pravastatin for cholesterol   Increase activity slowly   Complete by: As directed         Discharge Medications     Allergies as of 12/28/2019   No Known Allergies     Medication List    STOP taking these medications   amLODipine 5 MG tablet Commonly known as: NORVASC   insulin NPH Human 100 UNIT/ML injection Commonly known as: HumuLIN N   metoprolol tartrate 25 MG tablet Commonly known as:  LOPRESSOR   predniSONE 20 MG tablet Commonly known as: DELTASONE   sildenafil 100 MG tablet Commonly known as: Viagra   simvastatin 20 MG tablet Commonly known as: ZOCOR     TAKE these medications   acetaminophen 325 MG tablet Commonly known as: TYLENOL Take 2 tablets (650 mg total) by mouth every 6 (six) hours as needed for mild pain (or Fever >/= 101).   albuterol 108 (90 Base) MCG/ACT inhaler Commonly known as: VENTOLIN HFA Inhale 2 puffs into the lungs every 6 (six) hours as needed.   aspirin 81 MG EC tablet Take 1 tablet (81 mg total) by mouth daily with breakfast.   divalproex 500 MG DR tablet Commonly known as: DEPAKOTE Take 2 tablets (1,000 mg total) by mouth at bedtime.   furosemide 20 MG tablet Commonly known as: LASIX Take 1 tablet (20 mg total) by mouth as needed for fluid or edema. What changed:   medication strength  how much to take  reasons to take this   insulin aspart 100 UNIT/ML FlexPen Commonly known as: NOVOLOG Inject 3-15 Units into the skin 3 (three) times daily with meals. If glucose Before meal is between 130 to 150 --take 3 units, if 151 to 180 before meals take 4 units, 181 to 210 take 6 Units, 211 to 250 -take 8 units, 251-300---take 10 units, 301 to 350--12 units, 351 to 400 -take 15 units   insulin glargine 100 UNIT/ML injection Commonly known as: LANTUS Inject 0.4 mLs (40 Units total) into the skin daily. Start taking on: December 29, 2019   lisinopril 10 MG tablet Commonly known as: ZESTRIL Take 1 tablet (10 mg total) by mouth daily. For Heart What changed:   medication strength  how much to take  additional instructions   metoprolol succinate 50 MG 24 hr tablet Commonly known as:  Toprol XL Take 1 tablet (50 mg total) by mouth daily. For Heart   nicotine 21 mg/24hr patch Commonly known as: NICODERM CQ - dosed in mg/24 hours Place 21 mg onto the skin daily.   omeprazole 20 MG capsule Commonly known as: PRILOSEC Take 20 mg  by mouth at bedtime.   OneTouch Verio test strip Generic drug: glucose blood 1 each by Other route 2 (two) times daily. And lancets 2/day   risperidone 4 MG tablet Commonly known as: RISPERDAL Take 4 mg by mouth at bedtime.   solifenacin 5 MG tablet Commonly known as: VESICARE Take 5 mg by mouth at bedtime.   Sure Comfort Insulin Syringe 30G X 1/2" 1 ML Misc Generic drug: Insulin Syringe-Needle U-100 USE AS DIRECTED PER PROVIDER TO INJECT INSULIN DAILY.   Symbicort 160-4.5 MCG/ACT inhaler Generic drug: budesonide-formoterol Inhale 2 puffs into the lungs 2 (two) times daily.       Major procedures and Radiology Reports - PLEASE review detailed and final reports for all details, in brief -    CT ABDOMEN PELVIS WO CONTRAST  Result Date: 12/26/2019 CLINICAL DATA:  Acute nonlocalized abdominal pain EXAM: CT ABDOMEN AND PELVIS WITHOUT CONTRAST TECHNIQUE: Multidetector CT imaging of the abdomen and pelvis was performed following the standard protocol without IV contrast. COMPARISON:  05/29/2013 FINDINGS: Lower chest: Bilateral pulmonary reticulation that is chronic when compared with the 2019 chest CT. 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 Premature coronary atherosclerotic calcification. Hepatobiliary: Marked fatty infiltration of the liver.Cholecystectomy in the interim. Pancreas: Peripancreatic edema with diffuse pancreatic indistinctness and expansion. Spleen: Unremarkable. Adrenals/Urinary Tract: Negative adrenals. No hydronephrosis or stone. Unremarkable bladder. Stomach/Bowel:  No obstruction. No visible bowel inflammation. Vascular/Lymphatic: No acute vascular abnormality. Diffuse atherosclerotic calcification of the aorta and iliacs. No mass or adenopathy. Reproductive:Negative. Other: Small volume ascites. Musculoskeletal: No acute abnormalities. IMPRESSION: 1. Acute pancreatitis. 2. Hepatic steatosis. 3. Premature atherosclerotic calcification of the aorta and coronaries. 4.  Chronic lung disease with history of the eosinophilic pneumonia. Electronically Signed   By: Monte Fantasia M.D.   On: 12/26/2019 06:58   DG Chest Port 1 View  Result Date: 12/26/2019 CLINICAL DATA:  Shortness of breath. Abdominal pain since Thursday which is cramping EXAM: PORTABLE CHEST 1 VIEW COMPARISON:  01/04/2019 FINDINGS: Chronic lung disease with patchy bilateral pulmonary opacity that is unchanged. Normal heart size and mediastinal contours. No visible effusion or pneumothorax. IMPRESSION: Chronic lung disease without acute superimposed finding when compared with prior. Electronically Signed   By: Monte Fantasia M.D.   On: 12/26/2019 05:10   DG Hand Complete Right  Result Date: 12/08/2019 DG Hand Complete Right X-ray right hand status post second metacarpal fracture Patient treated in functional splint X-ray shows persistent angulation interval healing and no progression of the angulation of the fracture of the second metacarpal shaft Impression stable metacarpal shaft fracture with acceptable angulation interval healing  ECHOCARDIOGRAM COMPLETE  Result Date: 12/27/2019    ECHOCARDIOGRAM REPORT   Patient Name:   SHUNSUKE GRANZOW Date of Exam: 12/27/2019 Medical Rec #:  [id number]           Height:       73.0 in Accession #:    [id number]          Weight:       237.7 lb Date of Birth:  09/15/1975           BSA:          2.315 m Patient Age:  44 years            BP:           110/65 mmHg Patient Gender: M                   HR:           106 bpm. Exam Location:  [facility] Procedure: 2D Echo, Cardiac Doppler and Color Doppler Indications:    Atrial Fibrillation 427.31 / I48.91  History:        Patient has prior history of Echocardiogram examinations, most                 recent 03/03/2019. Arrythmias:Atrial Fibrillation; Risk                 Factors:Hypertension, Diabetes and Dyslipidemia. Obesity (From                 Hx), Tobacco abuse (From Hx).  Sonographer:    Alvino Chapel RCS Referring  Phys: [place]  1. Left ventricular ejection fraction, by estimation, is 45 to 50%. The left ventricle has mildly decreased function. The left ventricle demonstrates global hypokinesis. There is mild left ventricular hypertrophy. Left ventricular diastolic parameters were normal.  2. Right ventricular systolic function is normal. The right ventricular size is normal. There is normal pulmonary artery systolic pressure. The estimated right ventricular systolic pressure is 44.3 mmHg.  3. The mitral valve is grossly normal, mildly thickened. Mild mitral valve regurgitation.  4. The aortic valve is tricuspid. Aortic valve regurgitation is not visualized.  5. The inferior vena cava is dilated in size with >50% respiratory variability, suggesting right atrial pressure of 8 mmHg. FINDINGS  Left Ventricle: Left ventricular ejection fraction, by estimation, is 45 to 50%. The left ventricle has mildly decreased function. The left ventricle demonstrates global hypokinesis. The left ventricular internal cavity size was normal in size. There is  mild left ventricular hypertrophy. Left ventricular diastolic parameters were normal. Right Ventricle: The right ventricular size is normal. No increase in right ventricular wall thickness. Right ventricular systolic function is normal. There is normal pulmonary artery systolic pressure. The tricuspid regurgitant velocity is 1.69 m/s, and  with an assumed right atrial pressure of 8 mmHg, the estimated right ventricular systolic pressure is 15.4 mmHg. Left Atrium: Left atrial size was normal in size. Right Atrium: Right atrial size was normal in size. Pericardium: There is no evidence of pericardial effusion. Mitral Valve: The mitral valve is grossly normal. There is mild calcification of the mitral valve leaflet(s). Mild mitral valve regurgitation. Tricuspid Valve: The tricuspid valve is grossly normal. Tricuspid valve regurgitation is trivial. Aortic Valve: The  aortic valve is tricuspid. Aortic valve regurgitation is not visualized. Pulmonic Valve: The pulmonic valve was grossly normal. Pulmonic valve regurgitation is trivial. Aorta: The aortic root is normal in size and structure. Venous: The inferior vena cava is dilated in size with greater than 50% respiratory variability, suggesting right atrial pressure of 8 mmHg. IAS/Shunts: No atrial level shunt detected by color flow Doppler.  LEFT VENTRICLE PLAX 2D LVIDd:         5.23 cm  Diastology LVIDs:         4.02 cm  LV e' lateral:   13.30 cm/s LV PW:         1.30 cm  LV E/e' lateral: 7.3 LV IVS:        1.26 cm  LV e' medial:    10.80  cm/s LVOT diam:     2.20 cm  LV E/e' medial:  9.0 LV SV:         52 LV SV Index:   23 LVOT Area:     3.80 cm  RIGHT VENTRICLE RV S prime:     15.90 cm/s TAPSE (M-mode): 2.4 cm LEFT ATRIUM             Index       RIGHT ATRIUM           Index LA diam:        4.50 cm 1.94 cm/m  RA Area:     15.00 cm LA Vol (A2C):   42.1 ml 18.18 ml/m RA Volume:   31.60 ml  13.65 ml/m LA Vol (A4C):   55.0 ml 23.75 ml/m LA Biplane Vol: 52.3 ml 22.59 ml/m  AORTIC VALVE LVOT Vmax:   93.30 cm/s LVOT Vmean:  54.700 cm/s LVOT VTI:    0.138 m  AORTA Ao Root diam: 3.50 cm MITRAL VALVE               TRICUSPID VALVE MV Area (PHT): 4.08 cm    TR Peak grad:   11.4 mmHg MV Decel Time: 186 msec    TR Vmax:        169.00 cm/s MV E velocity: 97.30 cm/s MV A velocity: 74.60 cm/s  SHUNTS MV E/A ratio:  1.30        Systemic VTI:  0.14 m                            Systemic Diam: 2.20 cm Rozann Lesches MD Electronically signed by Rozann Lesches MD Signature Date/Time: 12/27/2019/3:43:14 PM    Final     Micro Results    Recent Results (from the past 240 hour(s))  SARS Coronavirus 2 by RT PCR (hospital order, performed in [facility] hospital lab) Nasopharyngeal Nasopharyngeal Swab     Status: None   Collection Time: 12/26/19  6:32 AM   Specimen: Nasopharyngeal Swab  Result Value Ref Range Status   SARS Coronavirus 2  NEGATIVE NEGATIVE Final    Comment: (NOTE) SARS-CoV-2 target nucleic acids are NOT DETECTED.  The SARS-CoV-2 RNA is generally detectable in upper and lower respiratory specimens during the acute phase of infection. The lowest concentration of SARS-CoV-2 viral copies this assay can detect is 250 copies / mL. A negative result does not preclude SARS-CoV-2 infection and should not be used as the sole basis for treatment or other patient management decisions.  A negative result may occur with improper specimen collection / handling, submission of specimen other than nasopharyngeal swab, presence of viral mutation(s) within the areas targeted by this assay, and inadequate number of viral copies (<250 copies / mL). A negative result must be combined with clinical observations, patient history, and epidemiological information.  Fact Sheet for Patients:   StrictlyIdeas.no  Fact Sheet for Healthcare Providers: BankingDealers.co.za  This test is not yet approved or  cleared by the [place] FDA and has been authorized for detection and/or diagnosis of SARS-CoV-2 by FDA under an Emergency Use Authorization (EUA).  This EUA will remain in effect (meaning this test can be used) for the duration of the COVID-19 declaration under Section 564(b)(1) of the Act, 21 U.S.C. section 360bbb-3(b)(1), unless the authorization is terminated or revoked sooner.  Performed at [facility], [address] [place], [place] [address]   Blood culture (routine x  2)     Status: None (Preliminary result)   Collection Time: 12/26/19  6:48 AM   Specimen: BLOOD  Result Value Ref Range Status   Specimen Description BLOOD LEFT HAND  Final   Special Requests   Final    BOTTLES DRAWN AEROBIC ONLY Blood Culture results may not be optimal due to an inadequate volume of blood received in culture bottles   Culture   Final    NO GROWTH 2 DAYS Performed at [facility], [address] [place], [place] [address]    Report Status PENDING  Incomplete  Blood culture (routine x 2)     Status: None (Preliminary result)   Collection Time: 12/26/19  7:00 AM   Specimen: BLOOD  Result Value Ref Range Status   Specimen Description BLOOD RIGHT HAND  Final   Special Requests   Final    IN BOTH AEROBIC AND ANAEROBIC BOTTLES Blood Culture results may not be optimal due to an inadequate volume of blood received in culture bottles   Culture   Final    NO GROWTH 2 DAYS Performed at [facility], [address] [place], [place] [address]    Report Status PENDING  Incomplete  MRSA PCR Screening     Status: None   Collection Time: 12/26/19 10:51 PM   Specimen: Nasal Mucosa; Nasopharyngeal  Result Value Ref Range Status   MRSA by PCR NEGATIVE NEGATIVE Final    Comment:        The GeneXpert MRSA Assay (FDA approved for NASAL specimens only), is one component of a comprehensive MRSA colonization surveillance program. It is not intended to diagnose MRSA infection nor to guide or monitor treatment for MRSA infections. Performed at [facility], [address] [place], [place] [address]        Today   Subjective    Giulio Bertino today has no new complaints          Patient has been seen and examined prior to discharge   Objective   Blood pressure (!) 121/60, pulse (!) 110, temperature 98.5 F (36.9 C), temperature source Oral, resp. rate 22, height 6\' 1"  (1.854 m), weight (!) 109.7 kg, SpO2 93 %.   Intake/Output Summary (Last 24 hours) at 12/28/2019 1328 Last data filed at 12/28/2019 1001 Gross per 24 hour  Intake 1354.01 ml  Output 525 ml  Net 829.01 ml    Exam Gen:- Awake Alert, no acute distress  HEENT:- [place].AT, No sclera icterus Neck-Supple Neck,No JVD,.  Lungs-  CTAB , good air movement bilaterally  CV- S1, S2 normal, regular Abd-  +ve B.Sounds, Abd Soft, No tenderness,    Extremity/Skin:- No  edema,   good pulses Psych-affect is  appropriate, oriented x3 Neuro-no new focal deficits, no tremors    Data Review   CBC w Diff:  Lab Results  Component Value Date   WBC 12.9 (H) 12/28/2019   HGB 11.1 (L) 12/28/2019   HGB 14.4 05/30/2013   HCT 36.0 (L) 12/28/2019   HCT 42 05/30/2013   PLT 173 12/28/2019   PLT 251 05/30/2013   LYMPHOPCT 7 12/26/2019   MONOPCT 7 12/26/2019   EOSPCT 0 12/26/2019   BASOPCT 0 12/26/2019    CMP:  Lab Results  Component Value Date   NA 133 (L) 12/28/2019   K 3.8 12/28/2019   CL 95 (L) 12/28/2019   CO2 28 12/28/2019   BUN 23 (H) 12/28/2019   CREATININE 0.93 12/28/2019   CREATININE 0.67 07/18/2019  PROT 6.2 (L) 12/28/2019   ALBUMIN 1.9 (L) 12/28/2019   BILITOT 0.5 12/28/2019   ALKPHOS 210 (H) 12/28/2019   AST 37 12/28/2019   ALT 25 12/28/2019  .   Total Discharge time is about 33 minutes  Roxan Hockey M.D on 12/28/2019 at 1:28 PM  Go to [email or web address] -  for contact info  [facility] - Office  [phone]

## 2019-12-28 NOTE — Plan of Care (Signed)
  Problem: Education: Goal: Knowledge of General Education information will improve Description Including pain rating scale, medication(s)/side effects and non-pharmacologic comfort measures Outcome: Completed/Met   Problem: Health Behavior/Discharge Planning: Goal: Ability to manage health-related needs will improve Outcome: Completed/Met   Problem: Clinical Measurements: Goal: Ability to maintain clinical measurements within normal limits will improve Outcome: Completed/Met Goal: Will remain free from infection Outcome: Completed/Met Goal: Diagnostic test results will improve Outcome: Completed/Met Goal: Respiratory complications will improve Outcome: Completed/Met Goal: Cardiovascular complication will be avoided Outcome: Completed/Met   Problem: Activity: Goal: Risk for activity intolerance will decrease Outcome: Completed/Met   Problem: Nutrition: Goal: Adequate nutrition will be maintained Outcome: Completed/Met   Problem: Coping: Goal: Level of anxiety will decrease Outcome: Completed/Met   Problem: Elimination: Goal: Will not experience complications related to bowel motility Outcome: Completed/Met Goal: Will not experience complications related to urinary retention Outcome: Completed/Met   Problem: Pain Managment: Goal: General experience of comfort will improve Outcome: Completed/Met   Problem: Safety: Goal: Ability to remain free from injury will improve Outcome: Completed/Met   Problem: Skin Integrity: Goal: Risk for impaired skin integrity will decrease Outcome: Completed/Met   Problem: Education: Goal: Knowledge of disease or condition will improve Outcome: Completed/Met Goal: Understanding of medication regimen will improve Outcome: Completed/Met Goal: Individualized Educational Video(s) Outcome: Completed/Met   Problem: Activity: Goal: Ability to tolerate increased activity will improve Outcome: Completed/Met   Problem: Cardiac: Goal:  Ability to achieve and maintain adequate cardiopulmonary perfusion will improve Outcome: Completed/Met   Problem: Health Behavior/Discharge Planning: Goal: Ability to safely manage health-related needs after discharge will improve Outcome: Completed/Met   

## 2019-12-31 LAB — CULTURE, BLOOD (ROUTINE X 2)
Culture: NO GROWTH
Culture: NO GROWTH

## 2020-01-04 ENCOUNTER — Ambulatory Visit: Payer: Medicaid Other | Admitting: Endocrinology

## 2020-01-09 ENCOUNTER — Ambulatory Visit: Payer: Medicaid Other | Admitting: Family Medicine

## 2020-01-18 ENCOUNTER — Ambulatory Visit: Payer: Medicaid Other | Admitting: Endocrinology

## 2020-01-25 ENCOUNTER — Other Ambulatory Visit: Payer: Self-pay | Admitting: Endocrinology

## 2020-03-01 ENCOUNTER — Encounter: Payer: Self-pay | Admitting: "Endocrinology

## 2020-03-01 ENCOUNTER — Ambulatory Visit (INDEPENDENT_AMBULATORY_CARE_PROVIDER_SITE_OTHER): Payer: Medicaid Other | Admitting: "Endocrinology

## 2020-03-01 ENCOUNTER — Other Ambulatory Visit: Payer: Self-pay

## 2020-03-01 VITALS — BP 106/75 | HR 119 | Ht 73.0 in | Wt 203.0 lb

## 2020-03-01 DIAGNOSIS — E1110 Type 2 diabetes mellitus with ketoacidosis without coma: Secondary | ICD-10-CM | POA: Diagnosis not present

## 2020-03-01 DIAGNOSIS — F17200 Nicotine dependence, unspecified, uncomplicated: Secondary | ICD-10-CM

## 2020-03-01 DIAGNOSIS — I10 Essential (primary) hypertension: Secondary | ICD-10-CM | POA: Diagnosis not present

## 2020-03-01 DIAGNOSIS — E782 Mixed hyperlipidemia: Secondary | ICD-10-CM

## 2020-03-01 DIAGNOSIS — I739 Peripheral vascular disease, unspecified: Secondary | ICD-10-CM

## 2020-03-01 LAB — POCT GLYCOSYLATED HEMOGLOBIN (HGB A1C): Hemoglobin A1C: 8.5 % — AB (ref 4.0–5.6)

## 2020-03-01 MED ORDER — INSULIN ASPART 100 UNIT/ML FLEXPEN: 5.0000 [IU] | PEN_INJECTOR | Freq: Three times a day (TID) | SUBCUTANEOUS | 1 refills | Status: DC

## 2020-03-01 MED ORDER — INSULIN GLARGINE 100 UNIT/ML ~~LOC~~ SOLN
30.0000 [IU] | Freq: Every day | SUBCUTANEOUS | 2 refills | Status: DC
Start: 2020-03-01 — End: 2020-09-11

## 2020-03-01 NOTE — Patient Instructions (Signed)

## 2020-03-01 NOTE — Progress Notes (Signed)
Endocrinology Consult Note       03/01/2020, 5:42 PM   Subjective:    Patient ID: [name], male    DOB: March 31, 1976.  [name] is being seen in consultation for management of currently uncontrolled symptomatic diabetes requested by  Jani Gravel, MD.   Past Medical History:  Diagnosis Date  . Bipolar disorder ([place])   . Chronic respiratory failure ([place])   . Erectile dysfunction   . Essential hypertension   . GERD (gastroesophageal reflux disease)   . History of pneumonia   . Hyperlipidemia   . Interstitial lung disease ([facility])    Pulmonary eosinophilia  . Obesity   . Pulmonary eosinophilia   . Sinus tachycardia   . Tobacco abuse   . Type 2 diabetes mellitus ([place])     Past Surgical History:  Procedure Laterality Date  . APPENDECTOMY    . CHOLECYSTECTOMY N/A 07/11/2013   Procedure: LAPAROSCOPIC CHOLECYSTECTOMY;  Surgeon: Jamesetta So, MD;  Location: [facility];  Service: General;  Laterality: N/A;  . INCISION AND DRAINAGE ABSCESS / HEMATOMA OF BURSA / KNEE / THIGH    . KNEE SURGERY Bilateral   . VIDEO BRONCHOSCOPY Bilateral 02/09/2018   Procedure: VIDEO BRONCHOSCOPY WITHOUT FLUORO;  Surgeon: Brand Males, MD;  Location: [facility];  Service: Cardiopulmonary;  Laterality: Bilateral;    Social History   Socioeconomic History  . Marital status: Single    Spouse name: Not on file  . Number of children: 0  . Years of education: Not on file  . Highest education level: Not on file  Occupational History  . Not on file  Tobacco Use  . Smoking status: Current Some Day Smoker    Packs/day: 0.50    Years: 10.00    Pack years: 5.00    Types: Cigarettes  . Smokeless tobacco: Never Used  Vaping Use  . Vaping Use: Never used  Substance and Sexual Activity  . Alcohol use: No  . Drug use: No  . Sexual activity: Never  Other Topics Concern  . Not on file  Social History  Narrative  . Not on file   Social Determinants of Health   Financial Resource Strain:   . Difficulty of Paying Living Expenses: Not on file  Food Insecurity:   . Worried About Charity fundraiser in the Last Year: Not on file  . Ran Out of Food in the Last Year: Not on file  Transportation Needs:   . Lack of Transportation (Medical): Not on file  . Lack of Transportation (Non-Medical): Not on file  Physical Activity:   . Days of Exercise per Week: Not on file  . Minutes of Exercise per Session: Not on file  Stress:   . Feeling of Stress : Not on file  Social Connections:   . Frequency of Communication with Friends and Family: Not on file  . Frequency of Social Gatherings with Friends and Family: Not on file  . Attends Religious Services: Not on file  . Active Member of Clubs or Organizations: Not on file  . Attends Archivist Meetings: Not on file  .  Marital Status: Not on file    Family History  Problem Relation Age of Onset  . Diabetes Mother   . Diabetes Maternal Grandmother   . CAD Maternal Grandmother        heart attack in her 29s  . Pancreatitis Neg Hx   . Colon cancer Neg Hx     Outpatient Encounter Medications as of 03/01/2020  Medication Sig  . albuterol (VENTOLIN HFA) 108 (90 Base) MCG/ACT inhaler Inhale 2 puffs into the lungs every 6 (six) hours as needed.  Marland Kitchen aspirin EC 81 MG EC tablet Take 1 tablet (81 mg total) by mouth daily with breakfast.  . divalproex (DEPAKOTE) 500 MG DR tablet Take 2 tablets (1,000 mg total) by mouth at bedtime.  . insulin aspart (NOVOLOG) 100 UNIT/ML FlexPen Inject 5-11 Units into the skin 3 (three) times daily with meals.  . insulin glargine (LANTUS) 100 UNIT/ML injection Inject 0.3 mLs (30 Units total) into the skin daily.  Marland Kitchen lisinopril (ZESTRIL) 10 MG tablet Take 1 tablet (10 mg total) by mouth daily. For Heart  . metoprolol succinate (TOPROL XL) 50 MG 24 hr tablet Take 1 tablet (50 mg total) by mouth daily. For Heart  .  Multiple Vitamin (MULTIVITAMIN) tablet Take 1 tablet by mouth daily.  . Multiple Vitamins-Minerals (VITAMIN D3 COMPLETE PO) Take 1,000 mg by mouth daily.  . nicotine (NICODERM CQ - DOSED IN MG/24 HOURS) 21 mg/24hr patch Place 21 mg onto the skin daily.   Marland Kitchen omeprazole (PRILOSEC) 20 MG capsule Take 20 mg by mouth at bedtime.  . risperidone (RISPERDAL) 4 MG tablet Take 4 mg by mouth at bedtime.   . simvastatin (ZOCOR) 20 MG tablet Take 20 mg by mouth daily.  . solifenacin (VESICARE) 5 MG tablet Take 5 mg by mouth at bedtime.   . SYMBICORT 160-4.5 MCG/ACT inhaler Inhale 2 puffs into the lungs 2 (two) times daily.  . [DISCONTINUED] insulin aspart (NOVOLOG) 100 UNIT/ML FlexPen Inject 3-15 Units into the skin 3 (three) times daily with meals. If glucose Before meal is between 130 to 150 --take 3 units, if 151 to 180 before meals take 4 units, 181 to 210 take 6 Units, 211 to 250 -take 8 units, 251-300---take 10 units, 301 to 350--12 units, 351 to 400 -take 15 units  . [DISCONTINUED] insulin glargine (LANTUS) 100 UNIT/ML injection Inject 0.4 mLs (40 Units total) into the skin daily. (Patient taking differently: Inject 20 Units into the skin 2 (two) times daily. )  . acetaminophen (TYLENOL) 325 MG tablet Take 2 tablets (650 mg total) by mouth every 6 (six) hours as needed for mild pain (or Fever >/= 101).  . furosemide (LASIX) 20 MG tablet Take 1 tablet (20 mg total) by mouth as needed for fluid or edema.  Marland Kitchen glucose blood (ONETOUCH VERIO) test strip 1 each by Other route 2 (two) times daily. And lancets 2/day  . Insulin Syringe-Needle U-100 (SURE COMFORT INSULIN SYRINGE) 30G X 1/2" 1 ML MISC USE AS DIRECTED PER PROVIDER TO INJECT INSULIN DAILY.   No facility-administered encounter medications on file as of 03/01/2020.    ALLERGIES: No Known Allergies  VACCINATION STATUS: Immunization History  Administered Date(s) Administered  . Influenza,inj,Quad PF,6+ Mos 06/06/2013  . Moderna SARS-COVID-2  Vaccination 08/13/2019, 09/14/2019    Diabetes He presents for his initial diabetic visit. He has type 2 diabetes mellitus. Onset time: He was diagnosed at approximate age of 64 years. His disease course has been worsening (He weighed approximately 270 pounds  during the time of his diagnosis.  He has chronic uncontrolled diabetes with previous episodes of impending diabetic ketoacidosis, hyperosmolar state. ). There are no hypoglycemic associated symptoms. Pertinent negatives for hypoglycemia include no confusion, headaches, pallor or seizures. Associated symptoms include blurred vision, fatigue, polydipsia, polyuria and weight loss. Pertinent negatives for diabetes include no chest pain, no polyphagia and no weakness. (He recently had pancreatitis which caused unintended weight loss.) There are no hypoglycemic complications. Symptoms are worsening. Diabetic complications include peripheral neuropathy and PVD. Risk factors for coronary artery disease include diabetes mellitus, dyslipidemia, family history, male sex, hypertension, sedentary lifestyle and tobacco exposure. Current diabetic treatment includes insulin injections (He is currently on Lantus 20 units twice daily, and NovoLog 5-15 units 3 times daily AC.). His weight is fluctuating minimally. He is following a generally unhealthy diet. When asked about meal planning, he reported none. He has not had a previous visit with a dietitian. He never participates in exercise. His home blood glucose trend is fluctuating dramatically. His overall blood glucose range is >200 mg/dl. (He did not bring any logs nor meter review.  His point-of-care A1c is 8.5%, progressively improving from 12.7% in March 2021.  He denies any recent hypoglycemic episodes.) An ACE inhibitor/angiotensin II receptor blocker is being taken. Eye exam is current.  Hypertension This is a chronic problem. The problem is controlled. Associated symptoms include blurred vision. Pertinent  negatives include no chest pain, headaches, neck pain, palpitations or shortness of breath. Risk factors for coronary artery disease include dyslipidemia, diabetes mellitus, male gender, sedentary lifestyle and smoking/tobacco exposure. Past treatments include ACE inhibitors. Hypertensive end-organ damage includes PVD.  Hyperlipidemia This is a chronic problem. The current episode started more than 1 year ago. The problem is uncontrolled. Exacerbating diseases include diabetes. Associated symptoms include myalgias. Pertinent negatives include no chest pain or shortness of breath. Current antihyperlipidemic treatment includes statins. Risk factors for coronary artery disease include diabetes mellitus, dyslipidemia, family history, male sex, hypertension and a sedentary lifestyle.     Review of Systems  Constitutional: Positive for fatigue and weight loss. Negative for chills, fever and unexpected weight change.  HENT: Negative for dental problem, mouth sores and trouble swallowing.   Eyes: Positive for blurred vision. Negative for visual disturbance.  Respiratory: Negative for cough, choking, chest tightness, shortness of breath and wheezing.   Cardiovascular: Negative for chest pain, palpitations and leg swelling.  Gastrointestinal: Negative for abdominal distention, abdominal pain, constipation, diarrhea, nausea and vomiting.  Endocrine: Positive for polydipsia and polyuria. Negative for polyphagia.  Genitourinary: Negative for dysuria, flank pain, hematuria and urgency.  Musculoskeletal: Positive for myalgias. Negative for back pain, gait problem and neck pain.  Skin: Negative for pallor, rash and wound.  Neurological: Negative for seizures, syncope, weakness, numbness and headaches.  Psychiatric/Behavioral: Negative for confusion and dysphoric mood.    Objective:    Vitals with BMI 03/01/2020 12/28/2019 12/28/2019  Height 6\' 1"  - -  Weight 203 lbs - -  BMI 36.14 - -  Systolic 431 540 -   Diastolic 75 60 -  Pulse 086 - 110    BP 106/75   Pulse (!) 119   Ht 6\' 1"  (1.854 m)   Wt 203 lb (92.1 kg)   BMI 26.78 kg/m   Wt Readings from Last 3 Encounters:  03/01/20 203 lb (92.1 kg)  12/28/19 (!) 241 lb 13.5 oz (109.7 kg)  12/08/19 237 lb (107.5 kg)     Physical Exam Constitutional:  General: He is not in acute distress.    Appearance: He is well-developed.  HENT:     Head: Normocephalic and atraumatic.  Neck:     Thyroid: No thyromegaly.     Trachea: No tracheal deviation.  Cardiovascular:     Rate and Rhythm: Normal rate.     Pulses:          Dorsalis pedis pulses are 1+ on the right side and 1+ on the left side.       Posterior tibial pulses are 1+ on the right side and 1+ on the left side.     Heart sounds: Normal heart sounds, S1 normal and S2 normal. No murmur heard.  No gallop.   Pulmonary:     Effort: Pulmonary effort is normal. No respiratory distress.     Breath sounds: No wheezing.  Abdominal:     General: There is no distension.     Tenderness: There is no abdominal tenderness. There is no guarding.  Musculoskeletal:     Right shoulder: No swelling or deformity.     Cervical back: Normal range of motion and neck supple.  Skin:    General: Skin is warm and dry.     Findings: No rash.     Nails: There is no clubbing.  Neurological:     Mental Status: He is alert and oriented to person, place, and time.     Cranial Nerves: No cranial nerve deficit.     Sensory: No sensory deficit.     Gait: Gait normal.     Deep Tendon Reflexes: Reflexes are normal and symmetric.  Psychiatric:        Speech: Speech normal.        Behavior: Behavior normal. Behavior is cooperative.        Thought Content: Thought content normal.        Judgment: Judgment normal.       CMP ( most recent) CMP     Component Value Date/Time   NA 133 (L) 12/28/2019 0447   K 3.8 12/28/2019 0447   CL 95 (L) 12/28/2019 0447   CO2 28 12/28/2019 0447   GLUCOSE 140 (H)  12/28/2019 0447   BUN 23 (H) 12/28/2019 0447   CREATININE 0.93 12/28/2019 0447   CREATININE 0.67 07/18/2019 1422   CALCIUM 8.2 (L) 12/28/2019 0447   PROT 6.2 (L) 12/28/2019 0447   ALBUMIN 1.9 (L) 12/28/2019 0447   AST 37 12/28/2019 0447   ALT 25 12/28/2019 0447   ALKPHOS 210 (H) 12/28/2019 0447   BILITOT 0.5 12/28/2019 0447   GFRNONAA >60 12/28/2019 0447   GFRAA >60 12/28/2019 0447     Diabetic Labs (most recent): Lab Results  Component Value Date   HGBA1C 8.5 (A) 03/01/2020   HGBA1C 9.3 (A) 11/01/2019   HGBA1C 12.7 (A) 08/09/2019     Lipid Panel ( most recent) Lipid Panel     Component Value Date/Time   TRIG 171 (H) 06/06/2013 0952      Lab Results  Component Value Date   TSH 1.111 12/26/2019   TSH 1.36 01/26/2019   TSH 1.78 10/13/2016   FREET4 0.86 01/26/2019      Assessment & Plan:   1. DM (diabetes mellitus) type 2, uncontrolled, with ketoacidosis (HCC)   - [name] has currently uncontrolled symptomatic type 2 DM since  44 years of age,  with most recent A1c of 8.5 %. Recent labs reviewed. - I had a long discussion with him about  the progressive nature of diabetes and the pathology behind its complications. -his diabetes is complicated by peripheral arterial disease, chronic smoking, pancreatitis, and he remains at extremely high risk for more acute and chronic complications which include CAD, CVA, CKD, retinopathy, and neuropathy. These are all discussed in detail with him.  - I have counseled him on diet  and weight management  by adopting a carbohydrate restricted/protein rich diet. Patient is encouraged to switch to  unprocessed or minimally processed     complex starch and increased protein intake (animal or plant source), fruits, and vegetables. -  he is advised to stick to a routine mealtimes to eat 3 meals  a day and avoid unnecessary snacks ( to snack only to correct hypoglycemia).   - he admits that there is a room for improvement in his  food and drink choices. - Suggestion is made for him to avoid simple carbohydrates  from his diet including Cakes, Sweet Desserts, Ice Cream, Soda (diet and regular), Sweet Tea, Candies, Chips, Cookies, Store Bought Juices, Alcohol in Excess of  1-2 drinks a day, Artificial Sweeteners,  Coffee Creamer, and "Sugar-free" Products. This will help patient to have more stable blood glucose profile and potentially avoid unintended weight gain.  - he will be scheduled with Jearld Fenton, RDN, CDE for diabetes education.  - I have approached him with the following individualized plan to manage  his diabetes and patient agrees:   -In light of his presentation with chronically uncontrolled type 2 diabetes, he will continue to need intensive treatment with basal/bolus insulin in order for him to achieve and maintain control of diabetes to target.   -His history is suggestive of type 2 diabetes rather than type I, will be considered for work-up for proper classification on subsequent visits.  -In the meantime, he is advised to lower his Lantus to 30 units nightly, lower his NovoLog to 5  units 3 times a day with meals  for pre-meal BG readings of 90-150mg /dl, plus patient specific correction dose for unexpected hyperglycemia above 150mg /dl, associated with strict monitoring of glucose 4 times a day-before meals and at bedtime. - he is warned not to take insulin without proper monitoring per orders. - Adjustment parameters are given to him for hypo and hyperglycemia in writing. - he is encouraged to call clinic for blood glucose levels less than 70 or above 300 mg /dl. -He will be treated exclusively with insulin at this time.    - he is not a candidate for SGLT2 inhibitors nor incretin therapy due to his body habitus and recent pancreatitis.    - Specific targets for  A1c;  LDL, HDL,  and Triglycerides were discussed with the patient.  2) Blood Pressure /Hypertension:  his blood pressure is  controlled to  target.   he is advised to continue his current medications including lisinopril 10 mg p.o. daily with breakfast . 3) Lipids/Hyperlipidemia:   Review of his recent lipid panel showed un controlled triglycerides at 171.  he will benefit from statin intervention.  He is advised to continue simvastatin 20 mg daily at bedtime.  Side effects and precautions discussed with him.  4)  Weight/Diet:  Body mass index is 26.78 kg/m.  - he is not a candidate for weight loss. I discussed with him the fact that loss of 5 - 10% of his  current body weight will have the most impact on his diabetes management.  Exercise, and detailed carbohydrates information provided  -  detailed  on discharge instructions.  5) Chronic Care/Health Maintenance:  -he  is on ACEI/ARB and Statin medications and  is encouraged to initiate and continue to follow up with Ophthalmology, Dentist,  Podiatrist at least yearly or according to recommendations, and advised to  quit smoking. I have recommended yearly flu vaccine and pneumonia vaccine at least every 5 years; moderate intensity exercise for up to 150 minutes weekly; and  sleep for at least 7 hours a day.  POCT ABI Results 03/01/20  His BMI is abnormal. Right ABI: PAD      left ABI: PAD  Right leg systolic / diastolic: PAD Left leg systolic / diastolic: PAD  Arm systolic / diastolic: 161/09 mmHG He will be referred to vascular surgery.     - he is  advised to maintain close follow up with Jani Gravel, MD for primary care needs, as well as his other providers for optimal and coordinated care.   - Time spent in this patient care: 60 min, of which > 50% was spent in  counseling  him about his chronically uncontrolled type 2 diabetes with multiple complications, hyperlipidemia, hypertension , smoking, peripheral arterial disease and the rest reviewing his blood glucose logs , discussing his hypoglycemia and hyperglycemia episodes, reviewing his current and  previous labs / studies   ( including abstraction from other facilities) and medications  doses and developing a  long term treatment plan based on the latest standards of care/ guidelines; and documenting his care.    Please refer to Patient Instructions for Blood Glucose Monitoring and Insulin/Medications Dosing Guide"  in media tab for additional information. Please  also refer to " Patient Self Inventory" in the Media  tab for reviewed elements of pertinent patient history.  [name] participated in the discussions, expressed understanding, and voiced agreement with the above plans.  All questions were answered to his satisfaction. he is encouraged to contact clinic should he have any questions or concerns prior to his return visit.   Follow up plan: - Return in about 1 week (around 03/08/2020) for F/U with Meter and Logs Only - no Labs.  Glade Lloyd, MD [facility] [address] [place], [place] [address] Phone: [phone]  Fax: [phone]    03/01/2020, 5:42 PM  This note was partially dictated with voice recognition software. Similar sounding words can be transcribed inadequately or may not  be corrected upon review.

## 2020-03-08 ENCOUNTER — Encounter: Payer: Self-pay | Admitting: "Endocrinology

## 2020-03-08 ENCOUNTER — Other Ambulatory Visit: Payer: Self-pay

## 2020-03-08 ENCOUNTER — Ambulatory Visit (INDEPENDENT_AMBULATORY_CARE_PROVIDER_SITE_OTHER): Payer: Medicaid Other | Admitting: "Endocrinology

## 2020-03-08 VITALS — BP 149/81 | HR 108 | Ht 73.0 in | Wt 206.0 lb

## 2020-03-08 DIAGNOSIS — E559 Vitamin D deficiency, unspecified: Secondary | ICD-10-CM | POA: Diagnosis not present

## 2020-03-08 DIAGNOSIS — I739 Peripheral vascular disease, unspecified: Secondary | ICD-10-CM

## 2020-03-08 DIAGNOSIS — I10 Essential (primary) hypertension: Secondary | ICD-10-CM

## 2020-03-08 DIAGNOSIS — E1110 Type 2 diabetes mellitus with ketoacidosis without coma: Secondary | ICD-10-CM | POA: Diagnosis not present

## 2020-03-08 MED ORDER — VITAMIN D3 125 MCG (5000 UT) PO CAPS: 5000.0000 [IU] | ORAL_CAPSULE | Freq: Every day | ORAL | 0 refills | Status: DC

## 2020-03-08 MED ORDER — INSULIN ASPART 100 UNIT/ML FLEXPEN: 8.0000 [IU] | PEN_INJECTOR | Freq: Three times a day (TID) | SUBCUTANEOUS | 2 refills | Status: DC

## 2020-03-08 NOTE — Patient Instructions (Signed)

## 2020-03-08 NOTE — Progress Notes (Signed)
03/08/2020, 8:17 PM   Endocrinology follow-up note  Subjective:    Patient ID: [name], male    DOB: 24-May-1976.  [name] is being seen in follow-up after he was seen in consultation for management of currently uncontrolled symptomatic diabetes requested by  Omar Gravel, MD.   Past Medical History:  Diagnosis Date  . Bipolar disorder ([name])   . Chronic respiratory failure ([name])   . Erectile dysfunction   . Essential hypertension   . GERD (gastroesophageal reflux disease)   . History of pneumonia   . Hyperlipidemia   . Interstitial lung disease (HCC)    Pulmonary eosinophilia  . Obesity   . Pulmonary eosinophilia ([place])   . Sinus tachycardia   . Tobacco abuse   . Type 2 diabetes mellitus ([name])     Past Surgical History:  Procedure Laterality Date  . APPENDECTOMY    . CHOLECYSTECTOMY N/A 07/11/2013   Procedure: LAPAROSCOPIC CHOLECYSTECTOMY;  Surgeon: Jamesetta So, MD;  Location: [facility];  Service: General;  Laterality: N/A;  . INCISION AND DRAINAGE ABSCESS / HEMATOMA OF BURSA / KNEE / THIGH    . KNEE SURGERY Bilateral   . VIDEO BRONCHOSCOPY Bilateral 02/09/2018   Procedure: VIDEO BRONCHOSCOPY WITHOUT FLUORO;  Surgeon: Brand Males, MD;  Location: [facility];  Service: Cardiopulmonary;  Laterality: Bilateral;    Social History   Socioeconomic History  . Marital status: Single    Spouse name: Not on file  . Number of children: 0  . Years of education: Not on file  . Highest education level: Not on file  Occupational History  . Not on file  Tobacco Use  . Smoking status: Current Some Day Smoker    Packs/day: 0.50    Years: 10.00    Pack years: 5.00    Types: Cigarettes  . Smokeless tobacco: Never Used  Vaping Use  . Vaping Use: Never used  Substance and Sexual Activity  . Alcohol use: No  . Drug use: No  . Sexual activity: Never  Other Topics  Concern  . Not on file  Social History Narrative  . Not on file   Social Determinants of Health   Financial Resource Strain:   . Difficulty of Paying Living Expenses: Not on file  Food Insecurity:   . Worried About Charity fundraiser in the Last Year: Not on file  . Ran Out of Food in the Last Year: Not on file  Transportation Needs:   . Lack of Transportation (Medical): Not on file  . Lack of Transportation (Non-Medical): Not on file  Physical Activity:   . Days of Exercise per Week: Not on file  . Minutes of Exercise per Session: Not on file  Stress:   . Feeling of Stress : Not on file  Social Connections:   . Frequency of Communication with Friends and Family: Not on file  . Frequency of Social Gatherings with Friends and Family: Not on file  . Attends Religious Services: Not on file  . Active Member of Clubs or Organizations: Not on file  . Attends Club  or Organization Meetings: Not on file  . Marital Status: Not on file    Family History  Problem Relation Age of Onset  . Diabetes Mother   . Diabetes Maternal Grandmother   . CAD Maternal Grandmother        heart attack in her 37s  . Pancreatitis Neg Hx   . Colon cancer Neg Hx     Outpatient Encounter Medications as of 03/08/2020  Medication Sig  . acetaminophen (TYLENOL) 325 MG tablet Take 2 tablets (650 mg total) by mouth every 6 (six) hours as needed for mild pain (or Fever >/= 101).  Marland Kitchen albuterol (VENTOLIN HFA) 108 (90 Base) MCG/ACT inhaler Inhale 2 puffs into the lungs every 6 (six) hours as needed.  Marland Kitchen aspirin EC 81 MG EC tablet Take 1 tablet (81 mg total) by mouth daily with breakfast.  . Cholecalciferol (VITAMIN D3) 125 MCG (5000 UT) CAPS Take 1 capsule (5,000 Units total) by mouth daily.  . divalproex (DEPAKOTE) 500 MG DR tablet Take 2 tablets (1,000 mg total) by mouth at bedtime.  . furosemide (LASIX) 20 MG tablet Take 1 tablet (20 mg total) by mouth as needed for fluid or edema.  Marland Kitchen glucose blood (ONETOUCH  VERIO) test strip 1 each by Other route 2 (two) times daily. And lancets 2/day  . insulin aspart (NOVOLOG) 100 UNIT/ML FlexPen Inject 8-14 Units into the skin 3 (three) times daily with meals.  . insulin glargine (LANTUS) 100 UNIT/ML injection Inject 0.3 mLs (30 Units total) into the skin daily.  . Insulin Syringe-Needle U-100 (SURE COMFORT INSULIN SYRINGE) 30G X 1/2" 1 ML MISC USE AS DIRECTED PER PROVIDER TO INJECT INSULIN DAILY.  Marland Kitchen lisinopril (ZESTRIL) 10 MG tablet Take 1 tablet (10 mg total) by mouth daily. For Heart  . metoprolol succinate (TOPROL XL) 50 MG 24 hr tablet Take 1 tablet (50 mg total) by mouth daily. For Heart  . Multiple Vitamin (MULTIVITAMIN) tablet Take 1 tablet by mouth daily.  . Multiple Vitamins-Minerals (VITAMIN D3 COMPLETE PO) Take 1,000 mg by mouth daily.  . nicotine (NICODERM CQ - DOSED IN MG/24 HOURS) 21 mg/24hr patch Place 21 mg onto the skin daily.   Marland Kitchen omeprazole (PRILOSEC) 20 MG capsule Take 20 mg by mouth at bedtime.  . risperidone (RISPERDAL) 4 MG tablet Take 4 mg by mouth at bedtime.   . simvastatin (ZOCOR) 20 MG tablet Take 20 mg by mouth daily.  . solifenacin (VESICARE) 5 MG tablet Take 5 mg by mouth at bedtime.   . SYMBICORT 160-4.5 MCG/ACT inhaler Inhale 2 puffs into the lungs 2 (two) times daily.  . [DISCONTINUED] insulin aspart (NOVOLOG) 100 UNIT/ML FlexPen Inject 5-11 Units into the skin 3 (three) times daily with meals.   No facility-administered encounter medications on file as of 03/08/2020.    ALLERGIES: No Known Allergies  VACCINATION STATUS: Immunization History  Administered Date(s) Administered  . Influenza,inj,Quad PF,6+ Mos 06/06/2013  . Moderna SARS-COVID-2 Vaccination 08/13/2019, 09/14/2019    Diabetes He presents for his follow-up diabetic visit. He has type 2 diabetes mellitus. Onset time: He was diagnosed at approximate age of 27 years. His disease course has been improving (He weighed approximately 270 pounds during the time of his  diagnosis.  He has chronic uncontrolled diabetes with previous episodes of impending diabetic ketoacidosis, hyperosmolar state. ). There are no hypoglycemic associated symptoms. Pertinent negatives for hypoglycemia include no confusion, headaches, pallor or seizures. Associated symptoms include blurred vision, fatigue, polydipsia, polyuria and weight loss. Pertinent negatives  for diabetes include no chest pain, no polyphagia and no weakness. (He recently had pancreatitis which caused unintended weight loss.) There are no hypoglycemic complications. Symptoms are worsening. Diabetic complications include peripheral neuropathy and PVD. Risk factors for coronary artery disease include diabetes mellitus, dyslipidemia, family history, male sex, hypertension, sedentary lifestyle and tobacco exposure. Current diabetic treatment includes insulin injections (He is currently on Lantus 20 units twice daily, and NovoLog 5-15 units 3 times daily AC.). His weight is fluctuating minimally. He is following a generally unhealthy diet. When asked about meal planning, he reported none. He has not had a previous visit with a dietitian. He never participates in exercise. His home blood glucose trend is decreasing steadily. His overall blood glucose range is >200 mg/dl. (He did not bring his meter, his logs show improved glycemic profile. His recent point-of-care A1c has been  8.5%, progressively improving from 12.7% in March 2021.  He denies any recent hypoglycemic episodes.) An ACE inhibitor/angiotensin II receptor blocker is being taken. Eye exam is current.  Hypertension This is a chronic problem. The problem is controlled. Associated symptoms include blurred vision. Pertinent negatives include no chest pain, headaches, neck pain, palpitations or shortness of breath. Risk factors for coronary artery disease include dyslipidemia, diabetes mellitus, male gender, sedentary lifestyle and smoking/tobacco exposure. Past treatments include  ACE inhibitors. Hypertensive end-organ damage includes PVD.  Hyperlipidemia This is a chronic problem. The current episode started more than 1 year ago. The problem is uncontrolled. Exacerbating diseases include diabetes. Associated symptoms include myalgias. Pertinent negatives include no chest pain or shortness of breath. Current antihyperlipidemic treatment includes statins. Risk factors for coronary artery disease include diabetes mellitus, dyslipidemia, family history, male sex, hypertension and a sedentary lifestyle.     Review of Systems  Constitutional: Positive for fatigue and weight loss. Negative for chills, fever and unexpected weight change.  HENT: Negative for dental problem, mouth sores and trouble swallowing.   Eyes: Positive for blurred vision. Negative for visual disturbance.  Respiratory: Negative for cough, choking, chest tightness, shortness of breath and wheezing.   Cardiovascular: Negative for chest pain, palpitations and leg swelling.  Gastrointestinal: Negative for abdominal distention, abdominal pain, constipation, diarrhea, nausea and vomiting.  Endocrine: Positive for polydipsia and polyuria. Negative for polyphagia.  Genitourinary: Negative for dysuria, flank pain, hematuria and urgency.  Musculoskeletal: Positive for myalgias. Negative for back pain, gait problem and neck pain.  Skin: Negative for pallor, rash and wound.  Neurological: Negative for seizures, syncope, weakness, numbness and headaches.  Psychiatric/Behavioral: Negative for confusion and dysphoric mood.    Objective:    Vitals with BMI 03/08/2020 03/01/2020 12/28/2019  Height 6\' 1"  6\' 1"  -  Weight 206 lbs 203 lbs -  BMI 16.10 96.04 -  Systolic 540 981 191  Diastolic 81 75 60  Pulse 478 119 -    BP (!) 149/81   Pulse (!) 108   Ht 6\' 1"  (1.854 m)   Wt 206 lb (93.4 kg)   BMI 27.18 kg/m   Wt Readings from Last 3 Encounters:  03/08/20 206 lb (93.4 kg)  03/01/20 203 lb (92.1 kg)  12/28/19  (!) 241 lb 13.5 oz (109.7 kg)     Physical Exam Constitutional:      General: He is not in acute distress.    Appearance: He is well-developed.  HENT:     Head: Normocephalic and atraumatic.  Neck:     Thyroid: No thyromegaly.     Trachea: No tracheal deviation.  Cardiovascular:  Rate and Rhythm: Normal rate.     Pulses:          Dorsalis pedis pulses are 1+ on the right side and 1+ on the left side.       Posterior tibial pulses are 1+ on the right side and 1+ on the left side.     Heart sounds: Normal heart sounds, S1 normal and S2 normal. No murmur heard.  No gallop.   Pulmonary:     Effort: Pulmonary effort is normal. No respiratory distress.     Breath sounds: No wheezing.  Abdominal:     General: There is no distension.     Tenderness: There is no abdominal tenderness. There is no guarding.  Musculoskeletal:     Right shoulder: No swelling or deformity.     Cervical back: Normal range of motion and neck supple.  Skin:    General: Skin is warm and dry.     Findings: No rash.     Nails: There is no clubbing.  Neurological:     Mental Status: He is alert and oriented to person, place, and time.     Cranial Nerves: No cranial nerve deficit.     Sensory: No sensory deficit.     Gait: Gait normal.     Deep Tendon Reflexes: Reflexes are normal and symmetric.  Psychiatric:        Speech: Speech normal.        Behavior: Behavior normal. Behavior is cooperative.        Thought Content: Thought content normal.        Judgment: Judgment normal.     CMP     Component Value Date/Time   NA 133 (L) 12/28/2019 0447   K 3.8 12/28/2019 0447   CL 95 (L) 12/28/2019 0447   CO2 28 12/28/2019 0447   GLUCOSE 140 (H) 12/28/2019 0447   BUN 23 (H) 12/28/2019 0447   CREATININE 0.93 12/28/2019 0447   CREATININE 0.67 07/18/2019 1422   CALCIUM 8.2 (L) 12/28/2019 0447   PROT 6.2 (L) 12/28/2019 0447   ALBUMIN 1.9 (L) 12/28/2019 0447   AST 37 12/28/2019 0447   ALT 25 12/28/2019  0447   ALKPHOS 210 (H) 12/28/2019 0447   BILITOT 0.5 12/28/2019 0447   GFRNONAA >60 12/28/2019 0447   GFRAA >60 12/28/2019 0447     Diabetic Labs (most recent): Lab Results  Component Value Date   HGBA1C 8.5 (A) 03/01/2020   HGBA1C 9.3 (A) 11/01/2019   HGBA1C 12.7 (A) 08/09/2019     Lipid Panel ( most recent) Lipid Panel     Component Value Date/Time   TRIG 171 (H) 06/06/2013 0952      Lab Results  Component Value Date   TSH 1.111 12/26/2019   TSH 1.36 01/26/2019   TSH 1.78 10/13/2016   FREET4 0.86 01/26/2019      Assessment & Plan:   1. DM (diabetes mellitus) type 2, uncontrolled, with ketoacidosis (HCC)   - DAY DEERY has currently uncontrolled symptomatic type 2 DM since  44 years of age.  He did not bring his meter, his logs show improved glycemic profile. His recent point-of-care A1c has been  8.5%, progressively improving from 12.7% in March 2021.  He denies any recent hypoglycemic episodes. - Recent labs reviewed. - I had a long discussion with him about the progressive nature of diabetes and the pathology behind its complications. -his diabetes is complicated by peripheral arterial disease, chronic smoking, pancreatitis, and he remains  at extremely high risk for more acute and chronic complications which include CAD, CVA, CKD, retinopathy, and neuropathy. These are all discussed in detail with him.  - I have counseled him on diet  and weight management  by adopting a carbohydrate restricted/protein rich diet. Patient is encouraged to switch to  unprocessed or minimally processed     complex starch and increased protein intake (animal or plant source), fruits, and vegetables. -  he is advised to stick to a routine mealtimes to eat 3 meals  a day and avoid unnecessary snacks ( to snack only to correct hypoglycemia).   - he  admits there is a room for improvement in his diet and drink choices. -  Suggestion is made for him to avoid simple carbohydrates   from his diet including Cakes, Sweet Desserts / Pastries, Ice Cream, Soda (diet and regular), Sweet Tea, Candies, Chips, Cookies, Sweet Pastries,  Store Bought Juices, Alcohol in Excess of  1-2 drinks a day, Artificial Sweeteners, Coffee Creamer, and "Sugar-free" Products. This will help patient to have stable blood glucose profile and potentially avoid unintended weight gain.   - he will be scheduled with Jearld Fenton, RDN, CDE for diabetes education.  - I have approached him with the following individualized plan to manage  his diabetes and patient agrees:   -In light of his presentation with chronically uncontrolled type 2 diabetes, he will continue to need intensive treatment with basal/bolus insulin in order for him to achieve and maintain control of diabetes to target.   -His history is suggestive of type 2 diabetes rather than type1, will be considered for work-up for proper classification on subsequent visits.  -In the meantime, he is advised to continue Lantus 30 units nightly, increase NovoLog to 8 units 3 times a day with meals  for pre-meal BG readings of 90-150mg /dl, plus patient specific correction dose for unexpected hyperglycemia above 150mg /dl, associated with strict monitoring of glucose 4 times a day-before meals and at bedtime. - he is warned not to take insulin without proper monitoring per orders. - Adjustment parameters are given to him for hypo and hyperglycemia in writing. - he is encouraged to call clinic for blood glucose levels less than 70 or above 300 mg /dl. -He will be treated exclusively with insulin at this time.    - he is not a candidate for SGLT2 inhibitors nor incretin therapy due to his body habitus and recent pancreatitis.    - Specific targets for  A1c;  LDL, HDL,  and Triglycerides were discussed with the patient.  2) Blood Pressure /Hypertension: His blood pressure is controlled to target. he is advised to continue his current medications including  lisinopril 10 mg p.o. daily with breakfast . 3) Lipids/Hyperlipidemia:   Review of his recent lipid panel showed uncontrolled triglycerides at 171.  he will benefit from statin intervention.  He is advised to continue simvastatin 20 mg p.o. daily at bedtime.  Side effects and precautions discussed with him.  4)  Weight/Diet:  Body mass index is 27.18 kg/m.  - he is not a candidate for weight loss. I discussed with him the fact that loss of 5 - 10% of his  current body weight will have the most impact on his diabetes management.  Exercise, and detailed carbohydrates information provided  -  detailed on discharge instructions.  5) Chronic Care/Health Maintenance:  -he  is on ACEI/ARB and Statin medications and  is encouraged to initiate and continue to follow up with  Ophthalmology, Dentist,  Podiatrist at least yearly or according to recommendations, and advised to  quit smoking. I have recommended yearly flu vaccine and pneumonia vaccine at least every 5 years; moderate intensity exercise for up to 150 minutes weekly; and  sleep for at least 7 hours a day.   His recent ABI was abnormal  Right ABI: PAD      left ABI: PAD  Right leg systolic / diastolic: PAD Left leg systolic / diastolic: PAD  Arm systolic / diastolic: 197/58 mmHG His consult with vascular surgery is pending.     - he is  advised to maintain close follow up with Omar Gravel, MD for primary care needs, as well as his other providers for optimal and coordinated care.  - Time spent on this patient care encounter:  35 min, of which > 50% was spent in  counseling and the rest reviewing his blood glucose logs , discussing his hypoglycemia and hyperglycemia episodes, reviewing his current and  previous labs / studies  ( including abstraction from other facilities) and medications  doses and developing a  long term treatment plan and documenting his care.   Please refer to Patient Instructions for Blood Glucose Monitoring and  Insulin/Medications Dosing Guide"  in media tab for additional information. Please  also refer to " Patient Self Inventory" in the Media  tab for reviewed elements of pertinent patient history.  [name] participated in the discussions, expressed understanding, and voiced agreement with the above plans.  All questions were answered to his satisfaction. he is encouraged to contact clinic should he have any questions or concerns prior to his return visit.    Follow up plan: - Return in about 3 months (around 06/08/2020) for F/U with Pre-visit Labs, Meter, Logs, A1c here., Urine MA - NV.  Glade Lloyd, MD [facility] [address] [place], [place] [address] Phone: [phone]  Fax: [phone]    03/08/2020, 8:17 PM  This note was partially dictated with voice recognition software. Similar sounding words can be transcribed inadequately or may not  be corrected upon review.

## 2020-03-14 ENCOUNTER — Other Ambulatory Visit: Payer: Self-pay | Admitting: "Endocrinology

## 2020-03-19 ENCOUNTER — Other Ambulatory Visit: Payer: Self-pay | Admitting: "Endocrinology

## 2020-03-19 ENCOUNTER — Other Ambulatory Visit: Payer: Self-pay

## 2020-03-19 DIAGNOSIS — I739 Peripheral vascular disease, unspecified: Secondary | ICD-10-CM

## 2020-03-21 NOTE — Progress Notes (Signed)
Cardiology Office Note    Date:  03/27/2020   ID:  [name], DOB August 30, 1975, MRN [id number]  PCP:  Omar Gravel, MD  Cardiologist: Omar Lesches, MD EPS: None  No chief complaint on file.   History of Present Illness:  [name] is a 44 y.o. male with a history of interstitial lung disease and pulmonary eosinophilia with chronic hypoxic respiratory failure, tobacco use, type 2 diabetes mellitus, hypertension, and GERD now presented 12/2019 with acute pancreatitis and newly documented rapid atrial fibrillation.  He spontaneously converted to normal sinus rhythm sinus tachycardia suspected associated with acute illness.  Toprol increased.  CHA2DS2-VASc score is 3.  No anticoagulation recommended because of this first episode of A. fib in the setting of acute illness.  Atherosclerotic calcifications of aorta on coronary CT previously on statin but stopped in case of potential association with pancreatitis.  He also had AKI with creatinine up to 2.29 but came back down to 0.93.  ACE inhibitor was stopped.  2D echo 12/27/2019 LVEF 45 to 50% with global hypokinesis could be secondary to A. fib with RVR and acute illness.    Patient comes in for f/u. Complains of heart racing with little activity. Was told his kidney function is fine per Dr. Marylene [name]. Taking lisinoril 10 mg and 20 mg tablet daily. Crt 0.82 02/27/20. Drinks 1 cup coffee, and 3 sodas daily. Smoking 4-5 cigarettes daily. Wants to quit. Has quit before with chantix but made him nauseated.     Past Medical History:  Diagnosis Date  . Bipolar disorder ([name])   . Chronic respiratory failure ([name])   . Erectile dysfunction   . Essential hypertension   . GERD (gastroesophageal reflux disease)   . History of pneumonia   . Hyperlipidemia   . Interstitial lung disease (HCC)    Pulmonary eosinophilia  . Obesity   . Pulmonary eosinophilia ([name])   . Sinus tachycardia   . Tobacco abuse   . Type 2 diabetes mellitus ([name])      Past Surgical History:  Procedure Laterality Date  . APPENDECTOMY    . CHOLECYSTECTOMY N/A 07/11/2013   Procedure: LAPAROSCOPIC CHOLECYSTECTOMY;  Surgeon: Omar So, MD;  Location: [facility];  Service: General;  Laterality: N/A;  . INCISION AND DRAINAGE ABSCESS / HEMATOMA OF BURSA / KNEE / THIGH    . KNEE SURGERY Bilateral   . VIDEO BRONCHOSCOPY Bilateral 02/09/2018   Procedure: VIDEO BRONCHOSCOPY WITHOUT FLUORO;  Surgeon: Omar Males, MD;  Location: [facility];  Service: Cardiopulmonary;  Laterality: Bilateral;    Current Medications: Current Meds  Medication Sig  . Accu-Chek FastClix Lancets MISC Use to test blood glucose four times daily  . acetaminophen (TYLENOL) 325 MG tablet Take 2 tablets (650 mg total) by mouth every 6 (six) hours as needed for mild pain (or Fever >/= 101).  [name] albuterol (VENTOLIN HFA) 108 (90 Base) MCG/ACT inhaler Inhale 2 puffs into the lungs every 6 (six) hours as needed.  [name] aspirin EC 81 MG EC tablet Take 1 tablet (81 mg total) by mouth daily with breakfast.  . Cholecalciferol (VITAMIN D3) 125 MCG (5000 UT) CAPS Take 1 capsule (5,000 Units total) by mouth daily.  . divalproex (DEPAKOTE) 500 MG DR tablet Take 2 tablets (1,000 mg total) by mouth at bedtime.  . furosemide (LASIX) 20 MG tablet Take 1 tablet (20 mg total) by mouth as needed for fluid or edema.  [name] glucose blood (ONETOUCH VERIO) test strip 1 each by Other  route 2 (two) times daily. And lancets 2/day  . insulin aspart (NOVOLOG) 100 UNIT/ML FlexPen Inject 8-14 Units into the skin 3 (three) times daily with meals.  . insulin glargine (LANTUS) 100 UNIT/ML injection Inject 0.3 mLs (30 Units total) into the skin daily.  . Insulin Syringe-Needle U-100 (SURE COMFORT INSULIN SYRINGE) 30G X 1/2" 1 ML MISC USE AS DIRECTED PER PROVIDER TO INJECT INSULIN DAILY.  . Multiple Vitamin (MULTIVITAMIN) tablet Take 1 tablet by mouth daily.  . nicotine (NICODERM CQ - DOSED IN MG/24 HOURS) 21 mg/24hr patch Place  21 mg onto the skin daily.   [name] omeprazole (PRILOSEC) 20 MG capsule Take 20 mg by mouth at bedtime.  . risperidone (RISPERDAL) 4 MG tablet Take 4 mg by mouth at bedtime.   . simvastatin (ZOCOR) 20 MG tablet Take 20 mg by mouth daily.  . solifenacin (VESICARE) 5 MG tablet Take 5 mg by mouth at bedtime.   . SURE COMFORT PEN NEEDLES 31G X 8 MM MISC USE TO TEST 4 TIMES DAILY.  . SYMBICORT 160-4.5 MCG/ACT inhaler Inhale 2 puffs into the lungs 2 (two) times daily.  . [DISCONTINUED] metoprolol succinate (TOPROL XL) 50 MG 24 hr tablet Take 1 tablet (50 mg total) by mouth daily. For Heart     Allergies:   Patient has no known allergies.   Social History   Socioeconomic History  . Marital status: Single    Spouse name: Not on file  . Number of children: 0  . Years of education: Not on file  . Highest education level: Not on file  Occupational History  . Not on file  Tobacco Use  . Smoking status: Current Some Day Smoker    Packs/day: 0.50    Years: 10.00    Pack years: 5.00    Types: Cigarettes  . Smokeless tobacco: Never Used  Vaping Use  . Vaping Use: Never used  Substance and Sexual Activity  . Alcohol use: No  . Drug use: No  . Sexual activity: Never  Other Topics Concern  . Not on file  Social History Narrative  . Not on file   Social Determinants of Health   Financial Resource Strain:   . Difficulty of Paying Living Expenses: Not on file  Food Insecurity:   . Worried About Charity fundraiser in the Last Year: Not on file  . Ran Out of Food in the Last Year: Not on file  Transportation Needs:   . Lack of Transportation (Medical): Not on file  . Lack of Transportation (Non-Medical): Not on file  Physical Activity:   . Days of Exercise per Week: Not on file  . Minutes of Exercise per Session: Not on file  Stress:   . Feeling of Stress : Not on file  Social Connections:   . Frequency of Communication with Friends and Family: Not on file  . Frequency of Social  Gatherings with Friends and Family: Not on file  . Attends Religious Services: Not on file  . Active Member of Clubs or Organizations: Not on file  . Attends Archivist Meetings: Not on file  . Marital Status: Not on file     Family History:  The patient's   family history includes CAD in his maternal grandmother; Diabetes in his maternal grandmother and mother.   ROS:   Please see the history of present illness.    ROS All other systems reviewed and are negative.   PHYSICAL EXAM:   VS:  BP 118/78   Pulse (!) 113   Ht 6\' 1"  (1.854 m)   Wt 200 lb (90.7 kg)   SpO2 91%   BMI 26.39 kg/m   Physical Exam  GEN: Well nourished, well developed, in no acute distress  Neck: no JVD, carotid bruits, or masses Cardiac:RRR; no murmurs, rubs, or gallops  Respiratory:  Decreased breath sounds but fine crackles throughout GI: soft, nontender, nondistended, + BS Ext: without cyanosis, clubbing, or edema, Good distal pulses bilaterally Neuro:  Alert and Oriented x 3 Psych: euthymic mood, full affect  Wt Readings from Last 3 Encounters:  03/27/20 200 lb (90.7 kg)  03/08/20 206 lb (93.4 kg)  03/01/20 203 lb (92.1 kg)      Studies/Labs Reviewed:   EKG:  EKG is  ordered today.  The ekg ordered today demonstrates sinus tachycardia at 113 bpm  Recent Labs: 12/26/2019: TSH 1.111 12/28/2019: ALT 25; BUN 23; Creatinine, Ser 0.93; Hemoglobin 11.1; Platelets 173; Potassium 3.8; Sodium 133   Lipid Panel    Component Value Date/Time   TRIG 171 (H) 06/06/2013 4970    Additional studies/ records that were reviewed today include:  2D echo 12/27/2019 IMPRESSIONS     1. Left ventricular ejection fraction, by estimation, is 45 to 50%. The  left ventricle has mildly decreased function. The left ventricle  demonstrates global hypokinesis. There is mild left ventricular  hypertrophy. Left ventricular diastolic parameters  were normal.   2. Right ventricular systolic function is normal.  The right ventricular  size is normal. There is normal pulmonary artery systolic pressure. The  estimated right ventricular systolic pressure is 26.3 mmHg.   3. The mitral valve is grossly normal, mildly thickened. Mild mitral  valve regurgitation.   4. The aortic valve is tricuspid. Aortic valve regurgitation is not  visualized.   5. The inferior vena cava is dilated in size with >50% respiratory  variability, suggesting right atrial pressure of 8 mmHg.   FINDINGS   Left Ventricle: Left ventricular ejection fraction, by estimation, is 45  to 50%. The left ventricle has mildly decreased function. The left  ventricle demonstrates global hypokinesis. The left ventricular internal  cavity size was normal in size. There is   mild left ventricular hypertrophy. Left ventricular diastolic parameters  were normal.   Right Ventricle: The right ventricular size is normal. No increase in  right ventricular wall thickness. Right ventricular systolic function is  normal. There is normal pulmonary artery systolic pressure. The tricuspid  regurgitant velocity is 1.69 m/s, and   with an assumed right atrial pressure of 8 mmHg, the estimated right  ventricular systolic pressure is 78.5 mmHg.   Left Atrium: Left atrial size was normal in size.   Right Atrium: Right atrial size was normal in size.   Pericardium: There is no evidence of pericardial effusion.   Mitral Valve: The mitral valve is grossly normal. There is mild  calcification of the mitral valve leaflet(s). Mild mitral valve  regurgitation.   Tricuspid Valve: The tricuspid valve is grossly normal. Tricuspid valve  regurgitation is trivial.   Aortic Valve: The aortic valve is tricuspid. Aortic valve regurgitation is  not visualized.   Pulmonic Valve: The pulmonic valve was grossly normal. Pulmonic valve  regurgitation is trivial.   Aorta: The aortic root is normal in size and structure.   Venous: The inferior vena cava is dilated  in size with greater than 50%  respiratory variability, suggesting right atrial pressure of 8 mmHg.  IAS/Shunts: No atrial level shunt detected by color flow Doppler.         ASSESSMENT:    1. Paroxysmal atrial fibrillation (HCC)   2. NICM (nonischemic cardiomyopathy) ([place])   3. AKI (acute kidney injury) ([place])   4. Interstitial pulmonary disease (HCC)      PLAN:  In order of problems listed above:  PAF with RVR in the setting of acute illness CHA2DS2-VASc score is 3 on Toprol no long-term anticoagulation recommended because of first incident occurring in acute illness.  Patient now with sinus tachycardia at 113 bpm and complains of fast heart rates with little activity.  Also anxious with the mask on.  Drinking a cup of coffee and 3-4 sodas daily, smoking and using inhalers.  Of asked him to cut back on his caffeine intake.  Increase Toprol to 100 mg once daily.  Follow-up in 2 to 3 weeks.  He has a blood pressure cuff at home and will keep track of his blood pressure and pulse readings.  Cardiomyopathy ejection fraction 45 to 50% with global hypokinesis will need reevaluated when he is back to baseline.  Could be related to acute illness and A. fib with RVR.  Still with sinus tachycardia we will hold off on ordering another echo until his heart rates better controlled.  Acute kidney injury in the setting of acute illness ACE inhibitor discontinued but was restarted in recent creatinine by renal 02/27/2020 was normal.  He is taking lisinopril 10 mg tablet with the 20 mg tablet for a total of 30 mg daily.  For some reason his PCP would not refill his medication.  We will refill 20 mg once daily since we are increasing his metoprolol.  Interstitial lung disease and pulmonary eosinophilia with chronic hypoxic respiratory failure on O2 at night.  Has had trouble getting in with pulmonary since Dr. Luan Pulling left.  We will put a referral through today.   Medication Adjustments/Labs and Tests  Ordered: Current medicines are reviewed at length with the patient today.  Concerns regarding medicines are outlined above.  Medication changes, Labs and Tests ordered today are listed in the Patient Instructions below. Patient Instructions  Medication Instructions:  INCREASE Toprol to 100 mg daily   DECREASE Lisinopril to 20 mg daily    *If you need a refill on your cardiac medications before your next appointment, please call your pharmacy*   Lab Work: None today If you have labs (blood work) drawn today and your tests are completely normal, you will receive your results only by: [name] MyChart Message (if you have MyChart) OR . A paper copy in the mail If you have any lab test that is abnormal or we need to change your treatment, we will call you to review the results.   Testing/Procedures: None today   Follow-Up: At [facility], you and your health needs are our priority.  As part of our continuing mission to provide you with exceptional heart care, we have created designated Provider Care Teams.  These Care Teams include your primary Cardiologist (physician) and Advanced Practice Providers (APPs -  Physician Assistants and Nurse Practitioners) who all work together to provide you with the care you need, when you need it.  We recommend signing up for the patient portal called "MyChart".  Sign up information is provided on this After Visit Summary.  MyChart is used to connect with patients for Virtual Visits (Telemedicine).  Patients are able to view lab/test results, encounter notes, upcoming appointments, etc.  Non-urgent messages can be sent to your provider as well.   To learn more about what you can do with MyChart, go to [email or web address].    Your next appointment:   3 week(s)  The format for your next appointment:   In Person  Provider:   Bernerd Pho, PA-C or Ermalinda Barrios, PA-C   Other Instructions We have referred you to pulmonary. They will call you  for an appointment   Check heart rate and blood pressure daily    Decrease sodium and caffeine        Signed, Ermalinda Barrios, PA-C  03/27/2020 1:34 PM    [facility] [address], [place], [place]  [address] Phone: [phone]; Fax: [phone]

## 2020-03-23 ENCOUNTER — Ambulatory Visit: Payer: Medicaid Other | Admitting: Cardiology

## 2020-03-23 ENCOUNTER — Other Ambulatory Visit: Payer: Self-pay | Admitting: Vascular Surgery

## 2020-03-26 ENCOUNTER — Ambulatory Visit (HOSPITAL_COMMUNITY)
Admission: RE | Admit: 2020-03-26 | Discharge: 2020-03-26 | Disposition: A | Discharge status: Home / Self Care | Payer: Medicaid Other | Source: Ambulatory Visit | Attending: Vascular Surgery | Admitting: Vascular Surgery

## 2020-03-26 ENCOUNTER — Other Ambulatory Visit: Payer: Self-pay

## 2020-03-26 DIAGNOSIS — I739 Peripheral vascular disease, unspecified: Secondary | ICD-10-CM | POA: Insufficient documentation

## 2020-03-27 ENCOUNTER — Encounter: Payer: Self-pay | Admitting: Physician Assistant

## 2020-03-27 ENCOUNTER — Ambulatory Visit (INDEPENDENT_AMBULATORY_CARE_PROVIDER_SITE_OTHER): Payer: Medicaid Other | Admitting: Physician Assistant

## 2020-03-27 VITALS — BP 118/78 | HR 113 | Ht 73.0 in | Wt 200.0 lb

## 2020-03-27 DIAGNOSIS — I428 Other cardiomyopathies: Secondary | ICD-10-CM | POA: Diagnosis not present

## 2020-03-27 DIAGNOSIS — J849 Interstitial pulmonary disease, unspecified: Secondary | ICD-10-CM | POA: Diagnosis not present

## 2020-03-27 DIAGNOSIS — I480 Paroxysmal atrial fibrillation: Secondary | ICD-10-CM

## 2020-03-27 DIAGNOSIS — N179 Acute kidney failure, unspecified: Secondary | ICD-10-CM | POA: Diagnosis not present

## 2020-03-27 MED ORDER — METOPROLOL SUCCINATE ER 100 MG PO TB24: 100.0000 mg | ORAL_TABLET | Freq: Every day | ORAL | 3 refills | Status: DC

## 2020-03-27 MED ORDER — LISINOPRIL 20 MG PO TABS: 20.0000 mg | ORAL_TABLET | Freq: Every day | ORAL | 3 refills | Status: DC

## 2020-03-27 NOTE — Patient Instructions (Signed)
Medication Instructions:  INCREASE Toprol to 100 mg daily   DECREASE Lisinopril to 20 mg daily    *If you need a refill on your cardiac medications before your next appointment, please call your pharmacy*   Lab Work: None today If you have labs (blood work) drawn today and your tests are completely normal, you will receive your results only by: Marland Kitchen MyChart Message (if you have MyChart) OR . A paper copy in the mail If you have any lab test that is abnormal or we need to change your treatment, we will call you to review the results.   Testing/Procedures: None today   Follow-Up: At [facility], you and your health needs are our priority.  As part of our continuing mission to provide you with exceptional heart care, we have created designated Provider Care Teams.  These Care Teams include your primary Cardiologist (physician) and Advanced Practice Providers (APPs -  Physician Assistants and Nurse Practitioners) who all work together to provide you with the care you need, when you need it.  We recommend signing up for the patient portal called "MyChart".  Sign up information is provided on this After Visit Summary.  MyChart is used to connect with patients for Virtual Visits (Telemedicine).  Patients are able to view lab/test results, encounter notes, upcoming appointments, etc.  Non-urgent messages can be sent to your provider as well.   To learn more about what you can do with MyChart, go to [email or web address].    Your next appointment:   3 week(s)  The format for your next appointment:   In Person  Provider:   Bernerd Pho, PA-C or Ermalinda Barrios, PA-C   Other Instructions We have referred you to pulmonary. They will call you for an appointment   Check heart rate and blood pressure daily    Decrease sodium and caffeine

## 2020-04-02 ENCOUNTER — Ambulatory Visit (INDEPENDENT_AMBULATORY_CARE_PROVIDER_SITE_OTHER): Payer: Medicaid Other | Admitting: Vascular Surgery

## 2020-04-02 ENCOUNTER — Other Ambulatory Visit: Payer: Self-pay

## 2020-04-02 ENCOUNTER — Encounter: Payer: Self-pay | Admitting: Vascular Surgery

## 2020-04-02 VITALS — BP 113/78 | HR 94 | Temp 97.3°F | Resp 16 | Ht 73.0 in | Wt 201.0 lb

## 2020-04-02 DIAGNOSIS — R6889 Other general symptoms and signs: Secondary | ICD-10-CM

## 2020-04-02 NOTE — Progress Notes (Signed)
[facility] [place]  Patient name: [name] MRN: [id number] DOB: 03/28/1976 Sex: male  REASON FOR CONSULT: Discuss recent abnormal screening ABI.  HPI: [name] is a 44 y.o. male, here today for discussion of abnormal ABI.  He has a long history of diabetes.  He does report episodes in the past of developing skin boils which occasionally have required surgical debridement.  These were treated with Augmentin and resolved.  These were more on his legs and not on his feet.  He has no history of tissue loss on his feet.  He does not have any claudication type symptoms and does not have any peripheral neuropathy.  Past Medical History:  Diagnosis Date  . Bipolar disorder ([place])   . Chronic respiratory failure ([place])   . Erectile dysfunction   . Essential hypertension   . GERD (gastroesophageal reflux disease)   . History of pneumonia   . Hyperlipidemia   . Interstitial lung disease (HCC)    Pulmonary eosinophilia  . Obesity   . Pulmonary eosinophilia ([place])   . Sinus tachycardia   . Tobacco abuse   . Type 2 diabetes mellitus (HCC)     Family History  Problem Relation Age of Onset  . Diabetes Mother   . Diabetes Maternal Grandmother   . CAD Maternal Grandmother        heart attack in her 62s  . Pancreatitis Neg Hx   . Colon cancer Neg Hx     SOCIAL HISTORY: Social History   Socioeconomic History  . Marital status: Single    Spouse name: Not on file  . Number of children: 0  . Years of education: Not on file  . Highest education level: Not on file  Occupational History  . Not on file  Tobacco Use  . Smoking status: Current Some Day Smoker    Packs/day: 0.50    Years: 10.00    Pack years: 5.00    Types: Cigarettes  . Smokeless tobacco: Never Used  Vaping Use  . Vaping Use: Never used  Substance and Sexual Activity  . Alcohol use: No  . Drug use: No  . Sexual activity: Never  Other Topics Concern  . Not on file    Social History Narrative  . Not on file   Social Determinants of Health   Financial Resource Strain:   . Difficulty of Paying Living Expenses: Not on file  Food Insecurity:   . Worried About Charity fundraiser in the Last Year: Not on file  . Ran Out of Food in the Last Year: Not on file  Transportation Needs:   . Lack of Transportation (Medical): Not on file  . Lack of Transportation (Non-Medical): Not on file  Physical Activity:   . Days of Exercise per Week: Not on file  . Minutes of Exercise per Session: Not on file  Stress:   . Feeling of Stress : Not on file  Social Connections:   . Frequency of Communication with Friends and Family: Not on file  . Frequency of Social Gatherings with Friends and Family: Not on file  . Attends Religious Services: Not on file  . Active Member of Clubs or Organizations: Not on file  . Attends Archivist Meetings: Not on file  . Marital Status: Not on file  Intimate Partner Violence:   . Fear of Current or Ex-Partner: Not on file  . Emotionally Abused: Not on file  .  Physically Abused: Not on file  . Sexually Abused: Not on file    No Known Allergies  Current Outpatient Medications  Medication Sig Dispense Refill  . Accu-Chek FastClix Lancets MISC Use to test blood glucose four times daily 100 each 0  . acetaminophen (TYLENOL) 325 MG tablet Take 2 tablets (650 mg total) by mouth every 6 (six) hours as needed for mild pain (or Fever >/= 101). 12 tablet 0  . albuterol (VENTOLIN HFA) 108 (90 Base) MCG/ACT inhaler Inhale 2 puffs into the lungs every 6 (six) hours as needed. 18 g 0  . aspirin EC 81 MG EC tablet Take 1 tablet (81 mg total) by mouth daily with breakfast. 30 tablet 11  . Cholecalciferol (VITAMIN D3) 125 MCG (5000 UT) CAPS Take 1 capsule (5,000 Units total) by mouth daily. 90 capsule 0  . divalproex (DEPAKOTE) 500 MG DR tablet Take 2 tablets (1,000 mg total) by mouth at bedtime. 30 tablet 5  . furosemide (LASIX) 20 MG  tablet Take 1 tablet (20 mg total) by mouth as needed for fluid or edema. 10 tablet 0  . glucose blood (ONETOUCH VERIO) test strip 1 each by Other route 2 (two) times daily. And lancets 2/day 100 each 12  . insulin aspart (NOVOLOG) 100 UNIT/ML FlexPen Inject 8-14 Units into the skin 3 (three) times daily with meals. 15 mL 2  . insulin glargine (LANTUS) 100 UNIT/ML injection Inject 0.3 mLs (30 Units total) into the skin daily. 15 mL 2  . Insulin Syringe-Needle U-100 (SURE COMFORT INSULIN SYRINGE) 30G X 1/2" 1 ML MISC USE AS DIRECTED PER PROVIDER TO INJECT INSULIN DAILY. 100 each 0  . lisinopril (ZESTRIL) 20 MG tablet Take 1 tablet (20 mg total) by mouth daily. 90 tablet 3  . metoprolol succinate (TOPROL-XL) 100 MG 24 hr tablet Take 1 tablet (100 mg total) by mouth daily. Take with or immediately following a meal. 90 tablet 3  . Multiple Vitamin (MULTIVITAMIN) tablet Take 1 tablet by mouth daily.    . nicotine (NICODERM CQ - DOSED IN MG/24 HOURS) 21 mg/24hr patch Place 21 mg onto the skin daily.     Marland Kitchen omeprazole (PRILOSEC) 20 MG capsule Take 20 mg by mouth at bedtime.    . risperidone (RISPERDAL) 4 MG tablet Take 4 mg by mouth at bedtime.     . simvastatin (ZOCOR) 20 MG tablet Take 20 mg by mouth daily.    . solifenacin (VESICARE) 5 MG tablet Take 5 mg by mouth at bedtime.     . SURE COMFORT PEN NEEDLES 31G X 8 MM MISC USE TO TEST 4 TIMES DAILY. 100 each 6  . SYMBICORT 160-4.5 MCG/ACT inhaler Inhale 2 puffs into the lungs 2 (two) times daily. 1 Inhaler 12   No current facility-administered medications for this visit.    REVIEW OF SYSTEMS:  [X]  denotes positive finding, [ ]  denotes negative finding Cardiac  Comments:  Chest pain or chest pressure:    Shortness of breath upon exertion:    Short of breath when lying flat:    Irregular heart rhythm:        Vascular    Pain in calf, thigh, or hip brought on by ambulation:    Pain in feet at night that wakes you up from your sleep:     Blood  clot in your veins:    Leg swelling:         Pulmonary    Oxygen at home: x   Productive  cough:  x   Wheezing:  x       Neurologic    Sudden weakness in arms or legs:  x   Sudden numbness in arms or legs:     Sudden onset of difficulty speaking or slurred speech:    Temporary loss of vision in one eye:     Problems with dizziness:         Gastrointestinal    Blood in stool:     Vomited blood:         Genitourinary    Burning when urinating:     Blood in urine:        Psychiatric    Major depression:         Hematologic    Bleeding problems:    Problems with blood clotting too easily:        Skin    Rashes or ulcers:        Constitutional    Fever or chills:      PHYSICAL EXAM: Vitals:   04/02/20 1407  BP: 113/78  Pulse: 94  Resp: 16  Temp: (!) 97.3 F (36.3 C)  TempSrc: Other (Comment)  SpO2: 96%  Weight: 201 lb (91.2 kg)  Height: 6\' 1"  (1.854 m)    GENERAL: The patient is a well-nourished male, in no acute distress. The vital signs are documented above. VASCULAR: 2+ radial pulses bilaterally.  2+ femoral 2+ popliteal and 2+ dorsalis pedis pulses bilaterally PULMONARY: There is good air exchange ABDOMEN: Soft and non-tender  MUSCULOSKELETAL: There are no major deformities or cyanosis. NEUROLOGIC: No focal weakness or paresthesias are detected. SKIN: There are no ulcers or rashes noted. PSYCHIATRIC: The patient has a normal affect.  DATA:   Noninvasive studies from [facility] on 03/26/2020 reviewed with the patient.  This reveals normal ankle arm index and normal triphasic waveforms in both lower extremities  MEDICAL ISSUES:  Discussed these findings with the patient.  I do not see any evidence of arterial insufficiency.  He was reassured with these discussion, he will continue to be attentive to good foot care and will notify should he did need any further care in the future   Rosetta Posner, MD [facility]  [place] Office phone [phone]

## 2020-04-13 ENCOUNTER — Encounter: Payer: Self-pay | Admitting: Dietician

## 2020-04-13 ENCOUNTER — Other Ambulatory Visit: Payer: Self-pay

## 2020-04-13 ENCOUNTER — Encounter: Payer: Medicaid Other | Attending: "Endocrinology | Admitting: Dietician

## 2020-04-13 DIAGNOSIS — E1110 Type 2 diabetes mellitus with ketoacidosis without coma: Secondary | ICD-10-CM | POA: Insufficient documentation

## 2020-04-13 NOTE — Patient Instructions (Addendum)
Drink at least 64 oz of water a day. Replace sodas and juices with water, or flavored sparkling water.  Aim for 3 carb choices (45g) at each meal. Balance your meals with protein and non-starchy vegetables.  Read your food labels. Read serving size first, then total carbohydrates!  Keep up walking on the treadmill.

## 2020-04-13 NOTE — Progress Notes (Signed)
Medical Nutrition Therapy:  Appt start time: 0800 end time:  0900.   Assessment:  Primary concerns today: Diabetes.   Pt reports taking Novolog 3x daily, is on sliding scale 8-14 units depending on premeal BG. Takes Lantus after breakfast. Pt is in the process of trying to quit smoking, has cut back from a pack a day to 5 or 6 a day. Pt is wearing a nicotine patch, reports nausea if he smokes while wearing the patch. Pt has been a diabetic since his 20's, is now 49. Pt reports that diabetes is "just a part of his life" anymore. Pt reports having pancreatitis in August, went to ER with abdominal pain. Reported blood sugars of over 500 at that time.  Pt checks BG fasting, before each meal, and before bed. FBG - usually 140-150, sometimes 200+ Before meals -  Mid 100s, sometimes 250+ Pt reports experiencing hypoglycemia in October, ~60 mg/dL. Knew to eat quickly to get sugar back up. Pt eats mostly packaged foods. Has no previous food label reading education.   Preferred Learning Style:   Auditory  Visual  Learning Readiness:   Contemplating   MEDICATIONS: Novolog MDI, Lantus, Lisinopril, Lasix, Prilosec, Nicoderm, Vit D, Multivitamin   DIETARY INTAKE:  Usual eating pattern includes 3 meals and 0 snacks per day.  Everyday foods include packaged meals.   24-hr recall:  B ( 8-9 AM): Bacon, egg, and cheese croissant. Coffee with sugar and cream  Snk ( AM): none  L ( 1-2 PM): Ham and cheese sandwich, potato chips, fruit punch Snk ( PM): none D ( PM): Lasagna, garlic toast, mello yellow Snk ( PM):  Beverages: Coffee, fruit punch, soda, water  Usual physical activity: ADLs, occasionally treadmill   Estimated energy needs: 1500 calories 170 g carbohydrates 112 g protein 42 g fat  Progress Towards Goal(s):  In progress.   Nutritional Diagnosis:  NB-1.1 Food and nutrition-related knowledge deficit As related to diabetes.  As evidenced by A1c of 8.5, 20+ year history of DM,  24 hr recall, and no previous nutrition education.    Intervention:  Nutrition Education.  Educated patient on the pathophysiology of diabetes. This includes why our bodies need circulating blood sugar, the relationship between insulin and blood sugar, and the results of insulin resistance and/or pancreatic insufficiency on the development of diabetes. Educated patient on factors that contribute to elevation of blood sugars, such as stress, illness, injury,and food choices. Discussed the role that physical activity plays in lowering blood sugar. Educate patient on the three main macronutrients. Protein, fats, and carbohydrates. Discussed how each of these macronutrients affect blood sugar levels, especially carbohydrate, and the importance of eating a consistent amount of carbohydrate throughout the day. Educated patient on carbohydrate counting, 15g of carbohydrate equals one carb choice. Advised patient on the importance of consistently checking their blood sugar, and recognizing how lifestyle and food choices affect those numbers. Educated patient on the "Rule of 15". Treat low blood sugar, below 70 mg/dL, by eating 15g of fast acting carbohydrate and rechecking blood sugar after 15 minutes. If blood sugar is still low, repeat. Discussed the role of diabetes medications in getting blood sugar under control, and considerations that may need to be taken to avoid complications.Educate patient on food label reading to identify the carbohydrate in packaged food, and the appropriate serving size to meet recommended carb choices per meal.  Goals:  Drink at least 64 oz of water a day. Replace sodas and juices with water, or flavored  sparkling water.  Aim for 3 carb choices (45g) at each meal. Balance your meals with protein and non-starchy vegetables.  Read your food labels. Read serving size first, then total carbohydrates!  Keep up walking on the treadmill.   Teaching Method Utilized:   Visual Auditory   Handouts given during visit include:  Diabetic MyPlate  Diabetes Instructions  Yellow Meal Plan card  Food Label Reading instructions  Barriers to learning/adherence to lifestyle change: food choices  Demonstrated degree of understanding via:  Teach Back   Monitoring/Evaluation:  Dietary intake, exercise, A1c, and body weight in 2 month(s).

## 2020-04-16 NOTE — Progress Notes (Deleted)
Cardiology Clinic Note   Patient Name: [name] Date of Encounter: 04/16/2020  Primary Care Provider:  Jani Gravel, MD Primary Cardiologist:  Rozann Lesches, MD  Patient Profile    [name] 44 year old male presents the clinic today for follow-up evaluation of his atrial fibrillation, hypertension, and PAD.  Past Medical History    Past Medical History:  Diagnosis Date  . Bipolar disorder ([place])   . Chronic respiratory failure ([place])   . Erectile dysfunction   . Essential hypertension   . GERD (gastroesophageal reflux disease)   . History of pneumonia   . Hyperlipidemia   . Interstitial lung disease ([facility])    Pulmonary eosinophilia  . Obesity   . Pulmonary eosinophilia ([place])   . Sinus tachycardia   . Tobacco abuse   . Type 2 diabetes mellitus ([place])    Past Surgical History:  Procedure Laterality Date  . APPENDECTOMY    . CHOLECYSTECTOMY N/A 07/11/2013   Procedure: LAPAROSCOPIC CHOLECYSTECTOMY;  Surgeon: Jamesetta So, MD;  Location: [facility];  Service: General;  Laterality: N/A;  . INCISION AND DRAINAGE ABSCESS / HEMATOMA OF BURSA / KNEE / THIGH    . KNEE SURGERY Bilateral   . VIDEO BRONCHOSCOPY Bilateral 02/09/2018   Procedure: VIDEO BRONCHOSCOPY WITHOUT FLUORO;  Surgeon: Brand Males, MD;  Location: [facility];  Service: Cardiopulmonary;  Laterality: Bilateral;    Allergies  No Known Allergies  History of Present Illness    [name] has a PMH of interstitial lung disease and pulmonary eosinophilia with chronic hypoxic respiratory failure type II DM, HTN, tobacco use, and GERD.  He was seen in the emergency department with acute pancreatitis and found to be in atrial fibrillation with RVR.  He spontaneously converted back to NSR/ST.  His metoprolol was increased.  CHA2DS2-VASc score 3.  It was not recommended that he start anticoagulation given this was his first episode of atrial fibrillation in the setting of acute illness.  His PMH  also includes atherosclerotic calcifications of the aorta which were seen on coronary CT.  He was on statin therapy but stopped due to possible complications with pancreatitis.  He was also noted to have acute kidney injury with his creatinine elevated to 2.29 which returned to 0.93.  His ACE was stopped.  Echocardiogram 12/27/2019 showed an LVEF of 45-50% with global hypokinesis.  It was felt this also may be secondary to A. fib with RVR and acute illness.  He was last seen by Ermalinda Barrios, PA-C on 03/27/2020.  At that time he indicated that his heart would race with minimal physical activity.  He stated he was told his kidney function was fine by Dr. Marylene Buerger.  He was taking lisinopril 10 mg and 20 mg tablet daily.  Creatinine 0.82 on 02/27/2020.  He reported drinking 1 cup of coffee, and three sodas daily.  He was smoking 4-5 cigarettes daily.  Indicated he want to quit.  He had quit before with Chantix however, he indicated it made him nauseated.  He presents the clinic today for follow-up evaluation states***   *** denies chest pain, shortness of breath, lower extremity edema, fatigue, palpitations, melena, hematuria, hemoptysis, diaphoresis, weakness, presyncope, syncope, orthopnea, and PND.  Home Medications    Prior to Admission medications   Medication Sig Start Date End Date Taking? Authorizing Provider  Accu-Chek FastClix Lancets MISC Use to test blood glucose four times daily 03/19/20   Cassandria Anger, MD  acetaminophen (TYLENOL) 325 MG tablet Take 2  tablets (650 mg total) by mouth every 6 (six) hours as needed for mild pain (or Fever >/= 101). 12/28/19   Emokpae, Courage, MD  albuterol (VENTOLIN HFA) 108 (90 Base) MCG/ACT inhaler Inhale 2 puffs into the lungs every 6 (six) hours as needed. 12/28/19   Roxan Hockey, MD  aspirin EC 81 MG EC tablet Take 1 tablet (81 mg total) by mouth daily with breakfast. 12/28/19   Roxan Hockey, MD  Cholecalciferol (VITAMIN D3) 125 MCG (5000 UT)  CAPS Take 1 capsule (5,000 Units total) by mouth daily. 03/08/20   Cassandria Anger, MD  divalproex (DEPAKOTE) 500 MG DR tablet Take 2 tablets (1,000 mg total) by mouth at bedtime. 12/28/19   Roxan Hockey, MD  furosemide (LASIX) 20 MG tablet Take 1 tablet (20 mg total) by mouth as needed for fluid or edema. 12/28/19   Roxan Hockey, MD  glucose blood (ONETOUCH VERIO) test strip 1 each by Other route 2 (two) times daily. And lancets 2/day 12/28/19   Roxan Hockey, MD  insulin aspart (NOVOLOG) 100 UNIT/ML FlexPen Inject 8-14 Units into the skin 3 (three) times daily with meals. 03/08/20   Cassandria Anger, MD  insulin glargine (LANTUS) 100 UNIT/ML injection Inject 0.3 mLs (30 Units total) into the skin daily. 03/01/20   Cassandria Anger, MD  Insulin Syringe-Needle U-100 (SURE COMFORT INSULIN SYRINGE) 30G X 1/2" 1 ML MISC USE AS DIRECTED PER PROVIDER TO INJECT INSULIN DAILY. 12/28/19   Roxan Hockey, MD  lisinopril (ZESTRIL) 20 MG tablet Take 1 tablet (20 mg total) by mouth daily. 03/27/20 06/25/20  Imogene Burn, PA-C  metoprolol succinate (TOPROL-XL) 100 MG 24 hr tablet Take 1 tablet (100 mg total) by mouth daily. Take with or immediately following a meal. 03/27/20 06/25/20  Satira Sark, MD  Multiple Vitamin (MULTIVITAMIN) tablet Take 1 tablet by mouth daily.    [provider]  nicotine (NICODERM CQ - DOSED IN MG/24 HOURS) 21 mg/24hr patch Place 21 mg onto the skin daily.  04/06/19   [provider]  omeprazole (PRILOSEC) 20 MG capsule Take 20 mg by mouth at bedtime.    [provider]  risperidone (RISPERDAL) 4 MG tablet Take 4 mg by mouth at bedtime.     [provider]  simvastatin (ZOCOR) 20 MG tablet Take 20 mg by mouth daily.    [provider]  solifenacin (VESICARE) 5 MG tablet Take 5 mg by mouth at bedtime.     [provider]  SURE COMFORT PEN NEEDLES 31G X 8 MM MISC USE TO TEST 4 TIMES DAILY. 03/14/20   Cassandria Anger, MD  SYMBICORT 160-4.5 MCG/ACT inhaler Inhale 2 puffs into the lungs 2 (two) times daily. 12/28/19   Roxan Hockey, MD    Family History    Family History  Problem Relation Age of Onset  . Diabetes Mother   . Diabetes Maternal Grandmother   . CAD Maternal Grandmother        heart attack in her 26s  . Pancreatitis Neg Hx   . Colon cancer Neg Hx    He indicated that his mother is deceased. He indicated that his father is alive. He indicated that his maternal grandmother is deceased. He indicated that the status of his neg hx is unknown.  Social History    Social History   Socioeconomic History  . Marital status: Single    Spouse name: Not on file  . Number of children: 0  . Years  of education: Not on file  . Highest education level: Not on file  Occupational History  . Not on file  Tobacco Use  . Smoking status: Current Some Day Smoker    Packs/day: 0.50    Years: 10.00    Pack years: 5.00    Types: Cigarettes  . Smokeless tobacco: Never Used  Vaping Use  . Vaping Use: Never used  Substance and Sexual Activity  . Alcohol use: No  . Drug use: No  . Sexual activity: Never  Other Topics Concern  . Not on file  Social History Narrative  . Not on file   Social Determinants of Health   Financial Resource Strain:   . Difficulty of Paying Living Expenses: Not on file  Food Insecurity:   . Worried About Charity fundraiser in the Last Year: Not on file  . Ran Out of Food in the Last Year: Not on file  Transportation Needs:   . Lack of Transportation (Medical): Not on file  . Lack of Transportation (Non-Medical): Not on file  Physical Activity:   . Days of Exercise per Week: Not on file  . Minutes of Exercise per Session: Not on file  Stress:   . Feeling of Stress : Not on file  Social Connections:   . Frequency of Communication with Friends and Family: Not on file  . Frequency of Social Gatherings with Friends and Family: Not on file  . Attends  Religious Services: Not on file  . Active Member of Clubs or Organizations: Not on file  . Attends Archivist Meetings: Not on file  . Marital Status: Not on file  Intimate Partner Violence:   . Fear of Current or Ex-Partner: Not on file  . Emotionally Abused: Not on file  . Physically Abused: Not on file  . Sexually Abused: Not on file     Review of Systems    General:  No chills, fever, night sweats or weight changes.  Cardiovascular:  No chest pain, dyspnea on exertion, edema, orthopnea, palpitations, paroxysmal nocturnal dyspnea. Dermatological: No rash, lesions/masses Respiratory: No cough, dyspnea Urologic: No hematuria, dysuria Abdominal:   No nausea, vomiting, diarrhea, bright red blood per rectum, melena, or hematemesis Neurologic:  No visual changes, wkns, changes in mental status. All other systems reviewed and are otherwise negative except as noted above.  Physical Exam    VS:  There were no vitals taken for this visit. , BMI There is no height or weight on file to calculate BMI. GEN: Well nourished, well developed, in no acute distress. HEENT: normal. Neck: Supple, no JVD, carotid bruits, or masses. Cardiac: RRR, no murmurs, rubs, or gallops. No clubbing, cyanosis, edema.  Radials/DP/PT 2+ and equal bilaterally.  Respiratory:  Respirations regular and unlabored, clear to auscultation bilaterally. GI: Soft, nontender, nondistended, BS + x 4. MS: no deformity or atrophy. Skin: warm and dry, no rash. Neuro:  Strength and sensation are intact. Psych: Normal affect.  Accessory Clinical Findings    Recent Labs: 12/26/2019: TSH 1.111 12/28/2019: ALT 25; BUN 23; Creatinine, Ser 0.93; Hemoglobin 11.1; Platelets 173; Potassium 3.8; Sodium 133   Recent Lipid Panel    Component Value Date/Time   TRIG 171 (H) 06/06/2013 0952    ECG personally reviewed by me today- *** - No acute changes  EKG 03/27/2020 Sinus tachycardia 113 bpm no ST or T wave  deviation.  Echocardiogram 12/27/2019 IMPRESSIONS    1. Left ventricular ejection fraction, by estimation, is 45 to  50%. The  left ventricle has mildly decreased function. The left ventricle  demonstrates global hypokinesis. There is mild left ventricular  hypertrophy. Left ventricular diastolic parameters  were normal.  2. Right ventricular systolic function is normal. The right ventricular  size is normal. There is normal pulmonary artery systolic pressure. The  estimated right ventricular systolic pressure is 00.9 mmHg.  3. The mitral valve is grossly normal, mildly thickened. Mild mitral  valve regurgitation.  4. The aortic valve is tricuspid. Aortic valve regurgitation is not  visualized.  5. The inferior vena cava is dilated in size with >50% respiratory  variability, suggesting right atrial pressure of 8 mmHg.   FINDINGS  Left Ventricle: Left ventricular ejection fraction, by estimation, is 45  to 50%. The left ventricle has mildly decreased function. The left  ventricle demonstrates global hypokinesis. The left ventricular internal  cavity size was normal in size. There is  mild left ventricular hypertrophy. Left ventricular diastolic parameters  were normal.   Right Ventricle: The right ventricular size is normal. No increase in  right ventricular wall thickness. Right ventricular systolic function is  normal. There is normal pulmonary artery systolic pressure. The tricuspid  regurgitant velocity is 1.69 m/s, and  with an assumed right atrial pressure of 8 mmHg, the estimated right  ventricular systolic pressure is 38.1 mmHg.   Left Atrium: Left atrial size was normal in size.   Right Atrium: Right atrial size was normal in size.   Pericardium: There is no evidence of pericardial effusion.   Mitral Valve: The mitral valve is grossly normal. There is mild  calcification of the mitral valve leaflet(s). Mild mitral valve  regurgitation.   Tricuspid Valve:  The tricuspid valve is grossly normal. Tricuspid valve  regurgitation is trivial.   Aortic Valve: The aortic valve is tricuspid. Aortic valve regurgitation is  not visualized.   Pulmonic Valve: The pulmonic valve was grossly normal. Pulmonic valve  regurgitation is trivial.   Aorta: The aortic root is normal in size and structure.   Venous: The inferior vena cava is dilated in size with greater than 50%  respiratory variability, suggesting right atrial pressure of 8 mmHg.   IAS/Shunts: No atrial level shunt detected by color flow Doppler.      Assessment & Plan   1.  Paroxysmal atrial fibrillation-EKG today shows***.  On follow-up 03/27/2020 EKG showed sinus tachycardia at 113 bpm.  CHA2DS2-VASc score 3.  Not placed on anticoagulation due to first bout of atrial fibrillation in the setting acute illness. Continue metoprolol 100 mg daily Heart healthy low-sodium diet-salty 6 given Increase physical activity as tolerated Avoid triggers caffeine, chocolate, EtOH etc.  Cardiomyopathy-no increased work of breathing today or activity intolerance.  Echocardiogram showed an EF of 45-50% with global hypokinesis.  Felt to be related to acute illness and atrial fibrillation with RVR.  Plan was made to hold off on repeat echocardiogram until rate control was established. Continue metoprolol Heart healthy low-sodium diet-salty 6 given Increase physical activity as tolerated  Essential hypertension-BP today***.  Much better control at home with increase lisinopril. Continue Toprol, lisinopril Heart healthy low-sodium diet-salty 6 given Increase physical activity as tolerated  Acute kidney injury-resolved.  Creatinine 0.93 on 12/28/2019.  Patient had resumed taking lisinopril 30 mg daily at last visit.  It was refilled. Followed by PCP.  Interstitial lung disease/chronic hypoxic respiratory failure -no increased work of breathing today.  Continues on at bedtime O2. Referral previously made to  pulmonology.  Disposition: Follow-up  with Dr. Domenic Polite in 3 months.  Jossie Ng. Breslin Hemann NP-C    04/16/2020, 7:50 AM [address] Office [phone] Fax [phone]  Notice: This dictation was prepared with Dragon dictation along with smaller phrase technology. Any transcriptional errors that result from this process are unintentional and may not be corrected upon review.

## 2020-04-20 ENCOUNTER — Ambulatory Visit: Payer: Medicaid Other | Admitting: General Practice

## 2020-05-01 NOTE — Progress Notes (Signed)
Cardiology Clinic Note   Patient Name: [name] Date of Encounter: 05/02/2020  Primary Care Provider:  Jani Gravel, MD Primary Cardiologist:  Rozann Lesches, MD  Patient Profile    [name] 44 year old male presents the clinic today for follow-up evaluation of his atrial fibrillation, hypertension, and PAD.  Past Medical History    Past Medical History:  Diagnosis Date   Bipolar disorder ([place])    Chronic respiratory failure ([place])    Erectile dysfunction    Essential hypertension    GERD (gastroesophageal reflux disease)    History of pneumonia    Hyperlipidemia    Interstitial lung disease ([facility])    Pulmonary eosinophilia   Obesity    Pulmonary eosinophilia ([facility])    Sinus tachycardia    Tobacco abuse    Type 2 diabetes mellitus ([place])    Past Surgical History:  Procedure Laterality Date   APPENDECTOMY     CHOLECYSTECTOMY N/A 07/11/2013   Procedure: LAPAROSCOPIC CHOLECYSTECTOMY;  Surgeon: Jamesetta So, MD;  Location: [facility];  Service: General;  Laterality: N/A;   INCISION AND DRAINAGE ABSCESS / HEMATOMA OF BURSA / KNEE / THIGH     KNEE SURGERY Bilateral    VIDEO BRONCHOSCOPY Bilateral 02/09/2018   Procedure: VIDEO BRONCHOSCOPY WITHOUT FLUORO;  Surgeon: Brand Males, MD;  Location: [facility];  Service: Cardiopulmonary;  Laterality: Bilateral;    Allergies  No Known Allergies  History of Present Illness    Mr. Mcmiller has a PMH of interstitial lung disease and pulmonary eosinophilia with chronic hypoxic respiratory failure type II DM, HTN, tobacco use, and GERD.  He was seen in the emergency department with acute pancreatitis and found to be in atrial fibrillation with RVR.  He spontaneously converted back to NSR/ST.  His metoprolol was increased.  CHA2DS2-VASc score 3.  It was not recommended that he start anticoagulation given this was his first episode of atrial fibrillation in the setting of acute illness.  His PMH  also includes atherosclerotic calcifications of the aorta which were seen on coronary CT.  He was on statin therapy but stopped due to possible complications with pancreatitis.  He was also noted to have acute kidney injury with his creatinine elevated to 2.29 which returned to 0.93.  His ACE was stopped.  Echocardiogram 12/27/2019 showed an LVEF of 45-50% with global hypokinesis.  It was felt this also may be secondary to A. fib with RVR and acute illness.  He was last seen by Ermalinda Barrios, PA-C on 03/27/2020.  At that time he indicated that his heart would race with minimal physical activity.  He stated he was told his kidney function was fine by Dr. Marylene Buerger.  He was taking lisinopril 10 mg and 20 mg tablet daily.  Creatinine 0.82 on 02/27/2020.  He reported drinking 1 cup of coffee, and three sodas daily.  He was smoking 4-5 cigarettes daily.  Indicated he wanted to quit.  He had quit before with Chantix however, he indicated it made him nauseated.  He presents the clinic today for follow-up evaluation states he notices increased heart rate with increased physical activity.  He has not been monitoring his blood pressures at home.  He indicates that he forgot but does have a blood pressure cuff.  He reports feeling anxious routinely and in the office today.  His EKG today shows normal sinus rhythm nonspecific T wave abnormality 97 bpm.  He reports taking his medications including his lisinopril and his metoprolol at night.  He continues to try to quit smoking and is now only smoking after meals.  I will give him salty 6 diet sheet, have him increase his physical activity as tolerated, and have him follow-up in 3 months.  Today he denies chest pain, shortness of breath, lower extremity edema, fatigue, palpitations, melena, hematuria, hemoptysis, diaphoresis, weakness, presyncope, syncope, orthopnea, and PND.   Home Medications    Prior to Admission medications   Medication Sig Start Date End Date Taking?  Authorizing Provider  Accu-Chek FastClix Lancets MISC Use to test blood glucose four times daily 03/19/20   Cassandria Anger, MD  acetaminophen (TYLENOL) 325 MG tablet Take 2 tablets (650 mg total) by mouth every 6 (six) hours as needed for mild pain (or Fever >/= 101). 12/28/19   Emokpae, Courage, MD  albuterol (VENTOLIN HFA) 108 (90 Base) MCG/ACT inhaler Inhale 2 puffs into the lungs every 6 (six) hours as needed. 12/28/19   Roxan Hockey, MD  aspirin EC 81 MG EC tablet Take 1 tablet (81 mg total) by mouth daily with breakfast. 12/28/19   Roxan Hockey, MD  Cholecalciferol (VITAMIN D3) 125 MCG (5000 UT) CAPS Take 1 capsule (5,000 Units total) by mouth daily. 03/08/20   Cassandria Anger, MD  divalproex (DEPAKOTE) 500 MG DR tablet Take 2 tablets (1,000 mg total) by mouth at bedtime. 12/28/19   Roxan Hockey, MD  furosemide (LASIX) 20 MG tablet Take 1 tablet (20 mg total) by mouth as needed for fluid or edema. 12/28/19   Roxan Hockey, MD  glucose blood (ONETOUCH VERIO) test strip 1 each by Other route 2 (two) times daily. And lancets 2/day 12/28/19   Roxan Hockey, MD  insulin aspart (NOVOLOG) 100 UNIT/ML FlexPen Inject 8-14 Units into the skin 3 (three) times daily with meals. 03/08/20   Cassandria Anger, MD  insulin glargine (LANTUS) 100 UNIT/ML injection Inject 0.3 mLs (30 Units total) into the skin daily. 03/01/20   Cassandria Anger, MD  Insulin Syringe-Needle U-100 (SURE COMFORT INSULIN SYRINGE) 30G X 1/2" 1 ML MISC USE AS DIRECTED PER PROVIDER TO INJECT INSULIN DAILY. 12/28/19   Roxan Hockey, MD  lisinopril (ZESTRIL) 20 MG tablet Take 1 tablet (20 mg total) by mouth daily. 03/27/20 06/25/20  Imogene Burn, PA-C  metoprolol succinate (TOPROL-XL) 100 MG 24 hr tablet Take 1 tablet (100 mg total) by mouth daily. Take with or immediately following a meal. 03/27/20 06/25/20  Satira Sark, MD  Multiple Vitamin (MULTIVITAMIN) tablet Take 1 tablet by mouth daily.     [provider]  nicotine (NICODERM CQ - DOSED IN MG/24 HOURS) 21 mg/24hr patch Place 21 mg onto the skin daily.  04/06/19   [provider]  omeprazole (PRILOSEC) 20 MG capsule Take 20 mg by mouth at bedtime.    [provider]  risperidone (RISPERDAL) 4 MG tablet Take 4 mg by mouth at bedtime.     [provider]  simvastatin (ZOCOR) 20 MG tablet Take 20 mg by mouth daily.    [provider]  solifenacin (VESICARE) 5 MG tablet Take 5 mg by mouth at bedtime.     [provider]  SURE COMFORT PEN NEEDLES 31G X 8 MM MISC USE TO TEST 4 TIMES DAILY. 03/14/20   Cassandria Anger, MD  SYMBICORT 160-4.5 MCG/ACT inhaler Inhale 2 puffs into the lungs 2 (two) times daily. 12/28/19   Roxan Hockey, MD    Family History    Family History  Problem Relation Age  of Onset   Diabetes Mother    Diabetes Maternal Grandmother    CAD Maternal Grandmother        heart attack in her 35s   Pancreatitis Neg Hx    Colon cancer Neg Hx    He indicated that his mother is deceased. He indicated that his father is alive. He indicated that his maternal grandmother is deceased. He indicated that the status of his neg hx is unknown.  Social History    Social History   Socioeconomic History   Marital status: Single    Spouse name: Not on file   Number of children: 0   Years of education: Not on file   Highest education level: Not on file  Occupational History   Not on file  Tobacco Use   Smoking status: Current Some Day Smoker    Packs/day: 0.50    Years: 10.00    Pack years: 5.00    Types: Cigarettes   Smokeless tobacco: Never Used  Scientific laboratory technician Use: Never used  Substance and Sexual Activity   Alcohol use: No   Drug use: No   Sexual activity: Never  Other Topics Concern   Not on file  Social History Narrative   Not on file   Social Determinants of Health   Financial Resource Strain:    Difficulty of Paying  Living Expenses: Not on file  Food Insecurity:    Worried About Charity fundraiser in the Last Year: Not on file   [employer] of Food in the Last Year: Not on file  Transportation Needs:    Lack of Transportation (Medical): Not on file   Lack of Transportation (Non-Medical): Not on file  Physical Activity:    Days of Exercise per Week: Not on file   Minutes of Exercise per Session: Not on file  Stress:    Feeling of Stress : Not on file  Social Connections:    Frequency of Communication with Friends and Family: Not on file   Frequency of Social Gatherings with Friends and Family: Not on file   Attends Religious Services: Not on file   Active Member of Clubs or Organizations: Not on file   Attends Archivist Meetings: Not on file   Marital Status: Not on file  Intimate Partner Violence:    Fear of Current or Ex-Partner: Not on file   Emotionally Abused: Not on file   Physically Abused: Not on file   Sexually Abused: Not on file     Review of Systems    General:  No chills, fever, night sweats or weight changes.  Cardiovascular:  No chest pain, dyspnea on exertion, edema, orthopnea, palpitations, paroxysmal nocturnal dyspnea. Dermatological: No rash, lesions/masses Respiratory: No cough, dyspnea Urologic: No hematuria, dysuria Abdominal:   No nausea, vomiting, diarrhea, bright red blood per rectum, melena, or hematemesis Neurologic:  No visual changes, wkns, changes in mental status. All other systems reviewed and are otherwise negative except as noted above.  Physical Exam    VS:  BP (!) 145/82    Pulse (!) 102    Ht 6\' 1"  (1.854 m)    Wt 198 lb 12.8 oz (90.2 kg)    SpO2 95%    BMI 26.23 kg/m  , BMI Body mass index is 26.23 kg/m. GEN: Well nourished, well developed, in no acute distress. HEENT: normal. Neck: Supple, no JVD, carotid bruits, or masses. Cardiac: RRR, no murmurs, rubs, or gallops. No clubbing, cyanosis,  edema.  Radials/DP/PT 2+ and  equal bilaterally.  Respiratory:  Respirations regular and unlabored, clear to auscultation bilaterally. GI: Soft, nontender, nondistended, BS + x 4. MS: no deformity or atrophy. Skin: warm and dry, no rash. Neuro:  Strength and sensation are intact. Psych: Normal affect.  Accessory Clinical Findings    Recent Labs: 12/26/2019: TSH 1.111 12/28/2019: ALT 25; BUN 23; Creatinine, Ser 0.93; Hemoglobin 11.1; Platelets 173; Potassium 3.8; Sodium 133   Recent Lipid Panel    Component Value Date/Time   TRIG 171 (H) 06/06/2013 0952    ECG personally reviewed by me today-normal sinus rhythm 97 bpm nonspecific T wave abnormality- No acute changes  EKG 03/27/2020 Sinus tachycardia 113 bpm no ST or T wave deviation.  Echocardiogram 12/27/2019 IMPRESSIONS    1. Left ventricular ejection fraction, by estimation, is 45 to 50%. The  left ventricle has mildly decreased function. The left ventricle  demonstrates global hypokinesis. There is mild left ventricular  hypertrophy. Left ventricular diastolic parameters  were normal.  2. Right ventricular systolic function is normal. The right ventricular  size is normal. There is normal pulmonary artery systolic pressure. The  estimated right ventricular systolic pressure is 32.6 mmHg.  3. The mitral valve is grossly normal, mildly thickened. Mild mitral  valve regurgitation.  4. The aortic valve is tricuspid. Aortic valve regurgitation is not  visualized.  5. The inferior vena cava is dilated in size with >50% respiratory  variability, suggesting right atrial pressure of 8 mmHg.   FINDINGS  Left Ventricle: Left ventricular ejection fraction, by estimation, is 45  to 50%. The left ventricle has mildly decreased function. The left  ventricle demonstrates global hypokinesis. The left ventricular internal  cavity size was normal in size. There is  mild left ventricular hypertrophy. Left ventricular diastolic parameters  were normal.    Right Ventricle: The right ventricular size is normal. No increase in  right ventricular wall thickness. Right ventricular systolic function is  normal. There is normal pulmonary artery systolic pressure. The tricuspid  regurgitant velocity is 1.69 m/s, and  with an assumed right atrial pressure of 8 mmHg, the estimated right  ventricular systolic pressure is 71.2 mmHg.   Left Atrium: Left atrial size was normal in size.   Right Atrium: Right atrial size was normal in size.   Pericardium: There is no evidence of pericardial effusion.   Mitral Valve: The mitral valve is grossly normal. There is mild  calcification of the mitral valve leaflet(s). Mild mitral valve  regurgitation.   Tricuspid Valve: The tricuspid valve is grossly normal. Tricuspid valve  regurgitation is trivial.   Aortic Valve: The aortic valve is tricuspid. Aortic valve regurgitation is  not visualized.   Pulmonic Valve: The pulmonic valve was grossly normal. Pulmonic valve  regurgitation is trivial.   Aorta: The aortic root is normal in size and structure.   Venous: The inferior vena cava is dilated in size with greater than 50%  respiratory variability, suggesting right atrial pressure of 8 mmHg.   IAS/Shunts: No atrial level shunt detected by color flow Doppler.       Assessment & Plan   1.   Paroxysmal atrial fibrillation-EKG today shows normal sinus rhythm 97 bpm.  On follow-up 03/27/2020 EKG showed sinus tachycardia at 113 bpm.  CHA2DS2-VASc score 3.  Not placed on anticoagulation due to first bout of atrial fibrillation in the setting acute illness. Continue metoprolol 100 mg daily Heart healthy low-sodium diet-salty 6 given Increase physical activity as  tolerated Avoid triggers caffeine, chocolate, EtOH etc.  Cardiomyopathy-no increased work of breathing today or increased activity intolerance.  Echocardiogram showed an EF of 45-50% with global hypokinesis.  Felt to be related to acute illness  and atrial fibrillation with RVR.  Plan was made to hold off on repeat echocardiogram until rate control was established. Continue metoprolol Heart healthy low-sodium diet-salty 6 given Increase physical activity as tolerated  Essential hypertension-BP today 145/82.  Suspect an anxiety component/whitecoat hypertension here, will have him keep a home BP log and call clinic with bp over 140/90. Taking BP medications at night. Continue Toprol, lisinopril Heart healthy low-sodium diet-salty 6 given Increase physical activity as tolerated BP log  Acute kidney injury-resolved.  Creatinine 0.93 on 12/28/2019.  Patient had resumed taking lisinopril 30 mg daily at last visit.  It was refilled. Followed by PCP.  Interstitial lung disease/chronic hypoxic respiratory failure -no increased work of breathing today.  Continues on at bedtime O2. Referral previously made to pulmonology.  Disposition: Follow-up with Dr. Domenic Polite in 3 months.   Jossie Ng. Gorge Almanza NP-C    05/02/2020, 2:36 PM [address] [address] Office [phone] Fax [phone]  Notice: This dictation was prepared with Dragon dictation along with smaller phrase technology. Any transcriptional errors that result from this process are unintentional and may not be corrected upon review.

## 2020-05-02 ENCOUNTER — Other Ambulatory Visit: Payer: Self-pay

## 2020-05-02 ENCOUNTER — Encounter: Payer: Self-pay | Admitting: General Practice

## 2020-05-02 ENCOUNTER — Ambulatory Visit (INDEPENDENT_AMBULATORY_CARE_PROVIDER_SITE_OTHER): Payer: Medicaid Other | Admitting: General Practice

## 2020-05-02 VITALS — BP 145/82 | HR 102 | Ht 73.0 in | Wt 198.8 lb

## 2020-05-02 DIAGNOSIS — N179 Acute kidney failure, unspecified: Secondary | ICD-10-CM | POA: Diagnosis not present

## 2020-05-02 DIAGNOSIS — I480 Paroxysmal atrial fibrillation: Secondary | ICD-10-CM | POA: Diagnosis not present

## 2020-05-02 DIAGNOSIS — I10 Essential (primary) hypertension: Secondary | ICD-10-CM

## 2020-05-02 DIAGNOSIS — I428 Other cardiomyopathies: Secondary | ICD-10-CM

## 2020-05-02 DIAGNOSIS — J849 Interstitial pulmonary disease, unspecified: Secondary | ICD-10-CM

## 2020-05-02 NOTE — Patient Instructions (Addendum)
Medication Instructions:  Continue all current medications.  Labwork: none  Testing/Procedures: none  Follow-Up: 3 months   Any Other Special Instructions Will Be Listed Below (If Applicable).  Your physician has requested that you regularly monitor and record your blood pressure readings at home 2 x week till next visit. Please use the same machine at the same time of day to check your readings and record them to bring to your follow-up visit.  Increase physical activity as tolerated.   If you need a refill on your cardiac medications before your next appointment, please call your pharmacy.

## 2020-06-06 LAB — COMPREHENSIVE METABOLIC PANEL
ALT: 4 IU/L (ref 0–44)
AST: 8 IU/L (ref 0–40)
Albumin/Globulin Ratio: 0.8 — ABNORMAL LOW (ref 1.2–2.2)
Albumin: 3.3 g/dL — ABNORMAL LOW (ref 4.0–5.0)
Alkaline Phosphatase: 132 IU/L — ABNORMAL HIGH (ref 44–121)
BUN/Creatinine Ratio: 10 (ref 9–20)
BUN: 8 mg/dL (ref 6–24)
Bilirubin Total: 0.2 mg/dL (ref 0.0–1.2)
CO2: 24 mmol/L (ref 20–29)
Calcium: 9.2 mg/dL (ref 8.7–10.2)
Chloride: 98 mmol/L (ref 96–106)
Creatinine, Ser: 0.83 mg/dL (ref 0.76–1.27)
GFR calc Af Amer: 125 mL/min/{1.73_m2} (ref >59)
GFR calc non Af Amer: 109 mL/min/{1.73_m2} (ref >59)
Globulin, Total: 4.1 g/dL (ref 1.5–4.5)
Glucose: 281 mg/dL — ABNORMAL HIGH (ref 65–99)
Potassium: 4.4 mmol/L (ref 3.5–5.2)
Sodium: 138 mmol/L (ref 134–144)
Total Protein: 7.4 g/dL (ref 6.0–8.5)

## 2020-06-06 LAB — VITAMIN D 25 HYDROXY (VIT D DEFICIENCY, FRACTURES): Vit D, 25-Hydroxy: 36.9 ng/mL (ref 30.0–100.0)

## 2020-06-06 LAB — T4, FREE: Free T4: 1.06 ng/dL (ref 0.82–1.77)

## 2020-06-06 LAB — LIPID PANEL
Chol/HDL Ratio: 3.7 ratio (ref 0.0–5.0)
Cholesterol, Total: 140 mg/dL (ref 100–199)
HDL: 38 mg/dL — ABNORMAL LOW (ref >39)
LDL Chol Calc (NIH): 72 mg/dL (ref 0–99)
Triglycerides: 178 mg/dL — ABNORMAL HIGH (ref 0–149)
VLDL Cholesterol Cal: 30 mg/dL (ref 5–40)

## 2020-06-06 LAB — TSH: TSH: 3.36 u[IU]/mL (ref 0.450–4.500)

## 2020-06-13 ENCOUNTER — Ambulatory Visit (INDEPENDENT_AMBULATORY_CARE_PROVIDER_SITE_OTHER): Payer: Medicaid Other | Admitting: "Endocrinology

## 2020-06-13 ENCOUNTER — Encounter: Payer: Self-pay | Admitting: "Endocrinology

## 2020-06-13 ENCOUNTER — Other Ambulatory Visit: Payer: Self-pay

## 2020-06-13 VITALS — BP 138/84 | HR 100 | Ht 73.0 in | Wt 196.2 lb

## 2020-06-13 DIAGNOSIS — E559 Vitamin D deficiency, unspecified: Secondary | ICD-10-CM

## 2020-06-13 DIAGNOSIS — I10 Essential (primary) hypertension: Secondary | ICD-10-CM | POA: Diagnosis not present

## 2020-06-13 DIAGNOSIS — E1110 Type 2 diabetes mellitus with ketoacidosis without coma: Secondary | ICD-10-CM | POA: Diagnosis not present

## 2020-06-13 DIAGNOSIS — E782 Mixed hyperlipidemia: Secondary | ICD-10-CM

## 2020-06-13 DIAGNOSIS — F17200 Nicotine dependence, unspecified, uncomplicated: Secondary | ICD-10-CM

## 2020-06-13 LAB — HEMOGLOBIN A1C: Hemoglobin A1C: 8.4

## 2020-06-13 NOTE — Progress Notes (Signed)
06/13/2020, 1:52 PM   Endocrinology follow-up note  Subjective:    Patient ID: [name], male    DOB: Jan 03, 1976.  [name] is being seen in follow-up after he was seen in consultation for management of currently uncontrolled symptomatic diabetes requested by  Jani Gravel, MD.   Past Medical History:  Diagnosis Date  . Bipolar disorder ([place])   . Chronic respiratory failure ([place])   . Erectile dysfunction   . Essential hypertension   . GERD (gastroesophageal reflux disease)   . History of pneumonia   . Hyperlipidemia   . Interstitial lung disease ([facility])    Pulmonary eosinophilia  . Obesity   . Pulmonary eosinophilia ([place])   . Sinus tachycardia   . Tobacco abuse   . Type 2 diabetes mellitus ([place])     Past Surgical History:  Procedure Laterality Date  . APPENDECTOMY    . CHOLECYSTECTOMY N/A 07/11/2013   Procedure: LAPAROSCOPIC CHOLECYSTECTOMY;  Surgeon: Jamesetta So, MD;  Location: [facility];  Service: General;  Laterality: N/A;  . INCISION AND DRAINAGE ABSCESS / HEMATOMA OF BURSA / KNEE / THIGH    . KNEE SURGERY Bilateral   . VIDEO BRONCHOSCOPY Bilateral 02/09/2018   Procedure: VIDEO BRONCHOSCOPY WITHOUT FLUORO;  Surgeon: Brand Males, MD;  Location: [facility];  Service: Cardiopulmonary;  Laterality: Bilateral;    Social History   Socioeconomic History  . Marital status: Single    Spouse name: Not on file  . Number of children: 0  . Years of education: Not on file  . Highest education level: Not on file  Occupational History  . Not on file  Tobacco Use  . Smoking status: Current Some Day Smoker    Packs/day: 0.50    Years: 10.00    Pack years: 5.00    Types: Cigarettes  . Smokeless tobacco: Never Used  Vaping Use  . Vaping Use: Never used  Substance and Sexual Activity  . Alcohol use: No  . Drug use: No  . Sexual activity: Never  Other Topics  Concern  . Not on file  Social History Narrative  . Not on file   Social Determinants of Health   Financial Resource Strain: Not on file  Food Insecurity: Not on file  Transportation Needs: Not on file  Physical Activity: Not on file  Stress: Not on file  Social Connections: Not on file    Family History  Problem Relation Age of Onset  . Diabetes Mother   . Diabetes Maternal Grandmother   . CAD Maternal Grandmother        heart attack in her 13s  . Pancreatitis Neg Hx   . Colon cancer Neg Hx     Outpatient Encounter Medications as of 06/13/2020  Medication Sig  . Accu-Chek FastClix Lancets MISC Use to test blood glucose four times daily  . acetaminophen (TYLENOL) 325 MG tablet Take 2 tablets (650 mg total) by mouth every 6 (six) hours as needed for mild pain (or Fever >/= 101).  Marland Kitchen albuterol (VENTOLIN HFA) 108 (90 Base) MCG/ACT inhaler Inhale 2 puffs into the  lungs every 6 (six) hours as needed.  Marland Kitchen aspirin EC 81 MG EC tablet Take 1 tablet (81 mg total) by mouth daily with breakfast.  . Cholecalciferol (VITAMIN D3) 125 MCG (5000 UT) CAPS Take 1 capsule (5,000 Units total) by mouth daily.  . divalproex (DEPAKOTE) 500 MG DR tablet Take 2 tablets (1,000 mg total) by mouth at bedtime.  . furosemide (LASIX) 20 MG tablet Take 1 tablet (20 mg total) by mouth as needed for fluid or edema.  Marland Kitchen glucose blood (ONETOUCH VERIO) test strip 1 each by Other route 2 (two) times daily. And lancets 2/day  . insulin aspart (NOVOLOG) 100 UNIT/ML FlexPen Inject 8-14 Units into the skin 3 (three) times daily with meals.  . insulin glargine (LANTUS) 100 UNIT/ML injection Inject 0.3 mLs (30 Units total) into the skin daily.  . Insulin Syringe-Needle U-100 (SURE COMFORT INSULIN SYRINGE) 30G X 1/2" 1 ML MISC USE AS DIRECTED PER PROVIDER TO INJECT INSULIN DAILY.  Marland Kitchen lisinopril (ZESTRIL) 20 MG tablet Take 1 tablet (20 mg total) by mouth daily.  . metoprolol succinate (TOPROL-XL) 100 MG 24 hr tablet Take 1  tablet (100 mg total) by mouth daily. Take with or immediately following a meal.  . Multiple Vitamin (MULTIVITAMIN) tablet Take 1 tablet by mouth daily.  Marland Kitchen omeprazole (PRILOSEC) 20 MG capsule Take 20 mg by mouth at bedtime.  . risperidone (RISPERDAL) 4 MG tablet Take 4 mg by mouth at bedtime.   . simvastatin (ZOCOR) 20 MG tablet Take 20 mg by mouth daily.  . solifenacin (VESICARE) 5 MG tablet Take 5 mg by mouth at bedtime.   . SURE COMFORT PEN NEEDLES 31G X 8 MM MISC USE TO TEST 4 TIMES DAILY.  . SYMBICORT 160-4.5 MCG/ACT inhaler Inhale 2 puffs into the lungs 2 (two) times daily.   No facility-administered encounter medications on file as of 06/13/2020.    ALLERGIES: No Known Allergies  VACCINATION STATUS: Immunization History  Administered Date(s) Administered  . Influenza,inj,Quad PF,6+ Mos 06/06/2013  . Moderna Sars-Covid-2 Vaccination 08/13/2019, 09/14/2019    Diabetes He presents for his follow-up diabetic visit. He has type 2 diabetes mellitus. Onset time: He was diagnosed at approximate age of 77 years. His disease course has been worsening (He weighed approximately 270 pounds during the time of his diagnosis.  He has chronic uncontrolled diabetes with previous episodes of impending diabetic ketoacidosis, hyperosmolar state. ). There are no hypoglycemic associated symptoms. Pertinent negatives for hypoglycemia include no confusion, headaches, pallor or seizures. Associated symptoms include blurred vision, fatigue, polydipsia, polyuria and weight loss. Pertinent negatives for diabetes include no chest pain, no polyphagia and no weakness. (He recently had pancreatitis which caused unintended weight loss.) There are no hypoglycemic complications. Symptoms are worsening. Diabetic complications include peripheral neuropathy and PVD. Risk factors for coronary artery disease include diabetes mellitus, dyslipidemia, family history, male sex, hypertension, sedentary lifestyle and tobacco  exposure. Current diabetic treatment includes insulin injections (He is currently on Lantus 20 units twice daily, and NovoLog 5-15 units 3 times daily AC.). His weight is fluctuating minimally. He is following a generally unhealthy diet. When asked about meal planning, he reported none. He has not had a previous visit with a dietitian. He never participates in exercise. His home blood glucose trend is increasing steadily. His overall blood glucose range is >200 mg/dl. (He did not bring his logs.  His meter average is 280 monitoring 1.5 times on average daily.  His point-of-care A1c is 8.4%, unchanged from his last  visit A1c of 8.5%, overall improving from 12.7% in March 2021.     He denies any recent hypoglycemic episodes.) An ACE inhibitor/angiotensin II receptor blocker is being taken. Eye exam is current.  Hypertension This is a chronic problem. The problem is controlled. Associated symptoms include blurred vision. Pertinent negatives include no chest pain, headaches, neck pain, palpitations or shortness of breath. Risk factors for coronary artery disease include dyslipidemia, diabetes mellitus, male gender, sedentary lifestyle and smoking/tobacco exposure. Past treatments include ACE inhibitors. Hypertensive end-organ damage includes PVD.  Hyperlipidemia This is a chronic problem. The current episode started more than 1 year ago. The problem is uncontrolled. Exacerbating diseases include diabetes. Associated symptoms include myalgias. Pertinent negatives include no chest pain or shortness of breath. Current antihyperlipidemic treatment includes statins. Risk factors for coronary artery disease include diabetes mellitus, dyslipidemia, family history, male sex, hypertension and a sedentary lifestyle.     Review of Systems  Constitutional: Positive for fatigue and weight loss. Negative for chills, fever and unexpected weight change.  HENT: Negative for dental problem, mouth sores and trouble swallowing.    Eyes: Positive for blurred vision. Negative for visual disturbance.  Respiratory: Negative for cough, choking, chest tightness, shortness of breath and wheezing.   Cardiovascular: Negative for chest pain, palpitations and leg swelling.  Gastrointestinal: Negative for abdominal distention, abdominal pain, constipation, diarrhea, nausea and vomiting.  Endocrine: Positive for polydipsia and polyuria. Negative for polyphagia.  Genitourinary: Negative for dysuria, flank pain, hematuria and urgency.  Musculoskeletal: Positive for myalgias. Negative for back pain, gait problem and neck pain.  Skin: Negative for pallor, rash and wound.  Neurological: Negative for seizures, syncope, weakness, numbness and headaches.  Psychiatric/Behavioral: Negative for confusion and dysphoric mood.    Objective:    Vitals with BMI 06/13/2020 05/02/2020 05/02/2020  Height 6\' 1"  - 6\' 1"   Weight 196 lbs 3 oz - 198 lbs 13 oz  BMI 54.00 - 86.76  Systolic 195 093 267  Diastolic 84 82 98  Pulse 124 - 102    BP 138/84   Pulse 100   Ht 6\' 1"  (1.854 m)   Wt 196 lb 3.2 oz (89 kg)   BMI 25.89 kg/m   Wt Readings from Last 3 Encounters:  06/13/20 196 lb 3.2 oz (89 kg)  05/02/20 198 lb 12.8 oz (90.2 kg)  04/13/20 199 lb 6.4 oz (90.4 kg)      CMP     Component Value Date/Time   NA 138 06/05/2020 1046   K 4.4 06/05/2020 1046   CL 98 06/05/2020 1046   CO2 24 06/05/2020 1046   GLUCOSE 281 (H) 06/05/2020 1046   GLUCOSE 140 (H) 12/28/2019 0447   BUN 8 06/05/2020 1046   CREATININE 0.83 06/05/2020 1046   CREATININE 0.67 07/18/2019 1422   CALCIUM 9.2 06/05/2020 1046   PROT 7.4 06/05/2020 1046   ALBUMIN 3.3 (L) 06/05/2020 1046   AST 8 06/05/2020 1046   ALT 4 06/05/2020 1046   ALKPHOS 132 (H) 06/05/2020 1046   BILITOT 0.2 06/05/2020 1046   GFRNONAA 109 06/05/2020 1046   GFRAA 125 06/05/2020 1046     Diabetic Labs (most recent): Lab Results  Component Value Date   HGBA1C 8.4 06/13/2020   HGBA1C 8.5 (A)  03/01/2020   HGBA1C 9.3 (A) 11/01/2019     Lipid Panel ( most recent) Lipid Panel     Component Value Date/Time   CHOL 140 06/05/2020 1046   TRIG 178 (H) 06/05/2020 1046   HDL  38 (L) 06/05/2020 1046   CHOLHDL 3.7 06/05/2020 1046   LDLCALC 72 06/05/2020 1046   LABVLDL 30 06/05/2020 1046      Lab Results  Component Value Date   TSH 3.360 06/05/2020   TSH 1.111 12/26/2019   TSH 1.36 01/26/2019   TSH 1.78 10/13/2016   FREET4 1.06 06/05/2020   FREET4 0.86 01/26/2019      Assessment & Plan:   1. DM (diabetes mellitus) type 2, uncontrolled, with ketoacidosis (HCC)   - MICAN COBY has currently uncontrolled symptomatic type 2 DM since  45 years of age.  He did not bring his logs.  His meter average is 280 monitoring 1.5 times on average daily.  His point-of-care A1c is 8.4%, unchanged from his last visit A1c of 8.5%, overall improving from 12.7% in March 2021.     He denies any recent hypoglycemic episodes.  - Recent labs reviewed. - I had a long discussion with him about the progressive nature of diabetes and the pathology behind its complications. -his diabetes is complicated by peripheral arterial disease, chronic smoking, pancreatitis, and he remains at extremely high risk for more acute and chronic complications which include CAD, CVA, CKD, retinopathy, and neuropathy. These are all discussed in detail with him.  - I have counseled him on diet  and weight management  by adopting a carbohydrate restricted/protein rich diet. Patient is encouraged to switch to  unprocessed or minimally processed     complex starch and increased protein intake (animal or plant source), fruits, and vegetables. -  he is advised to stick to a routine mealtimes to eat 3 meals  a day and avoid unnecessary snacks ( to snack only to correct hypoglycemia).   - he acknowledges that there is a room for improvement in his food and drink choices. - Suggestion is made for him to avoid simple  carbohydrates  from his diet including Cakes, Sweet Desserts, Ice Cream, Soda (diet and regular), Sweet Tea, Candies, Chips, Cookies, Store Bought Juices, Alcohol in Excess of  1-2 drinks a day, Artificial Sweeteners,  Coffee Creamer, and "Sugar-free" Products, Lemonade. This will help patient to have more stable blood glucose profile and potentially avoid unintended weight gain.   - he will be scheduled with Jearld Fenton, RDN, CDE for diabetes education.  - I have approached him with the following individualized plan to manage  his diabetes and patient agrees:   -In light of his presentation with chronically uncontrolled type 2 diabetes, he will continue to need intensive treatment with basal/bolus insulin in order for him to achieve and maintain control of diabetes to target.   -His history is suggestive of type 2 diabetes rather than type 1, will be considered for work-up for proper classification on subsequent visits.    -In the meantime, he is advised to continue Lantus 30 units nightly, continue NovoLog 8 units 3 times a day with meals  for pre-meal BG readings of 90-150mg /dl, plus patient specific correction dose for unexpected hyperglycemia above 150mg /dl, associated with strict monitoring of glucose 4 times a day-before meals and at bedtime. - he is warned not to take insulin without proper monitoring per orders. - Adjustment parameters are given to him for hypo and hyperglycemia in writing. - he is encouraged to call clinic for blood glucose levels less than 70 or above 300 mg /dl. -He will be treated exclusively with insulin at this time.     - he is not a candidate for SGLT2 inhibitors nor  incretin therapy due to his body habitus and recent pancreatitis.    - Specific targets for  A1c;  LDL, HDL,  and Triglycerides were discussed with the patient.  2) Blood Pressure /Hypertension:  His blood pressure is controlled to target. he is advised to continue his current medications  including lisinopril 10 mg p.o. daily with breakfast . 3) Lipids/Hyperlipidemia:   His previsit lipid panel showed improved LDL at 72, hypertriglyceridemia at 178.  He will continue to benefit from statin intervention.  He is advised to continue simvastatin 20 mg p.o. daily at bedtime. Side effects and precautions discussed with him.  4)  Weight/Diet:  Body mass index is 25.89 kg/m.  - he is not a candidate for weight loss. I discussed with him the fact that loss of 5 - 10% of his  current body weight will have the most impact on his diabetes management.  Exercise, and detailed carbohydrates information provided  -  detailed on discharge instructions.  5) Chronic Care/Health Maintenance:  -he  is on ACEI/ARB and Statin medications and  is encouraged to initiate and continue to follow up with Ophthalmology, Dentist,  Podiatrist at least yearly or according to recommendations, and advised to  quit smoking. I have recommended yearly flu vaccine and pneumonia vaccine at least every 5 years; moderate intensity exercise for up to 150 minutes weekly; and  sleep for at least 7 hours a day.   He was evaluated by vascular surgery, further work-up did not show significant peripheral arterial disease.   - he is  advised to maintain close follow up with Jani Gravel, MD for primary care needs, as well as his other providers for optimal and coordinated care.  - Time spent on this patient care encounter:  35 min, of which > 50% was spent in  counseling and the rest reviewing his blood glucose logs , discussing his hypoglycemia and hyperglycemia episodes, reviewing his current and  previous labs / studies  ( including abstraction from other facilities) and medications  doses and developing a  long term treatment plan and documenting his care.   Please refer to Patient Instructions for Blood Glucose Monitoring and Insulin/Medications Dosing Guide"  in media tab for additional information. Please  also refer to "  Patient Self Inventory" in the Media  tab for reviewed elements of pertinent patient history.  [name] participated in the discussions, expressed understanding, and voiced agreement with the above plans.  All questions were answered to his satisfaction. he is encouraged to contact clinic should he have any questions or concerns prior to his return visit.   Follow up plan: - Return in about 3 months (around 09/11/2020) for Bring Meter and Logs- A1c in Office.  Glade Lloyd, MD [facility] [address] [place], [place] [address] Phone: [phone]  Fax: [phone]    06/13/2020, 1:52 PM  This note was partially dictated with voice recognition software. Similar sounding words can be transcribed inadequately or may not  be corrected upon review.

## 2020-06-13 NOTE — Patient Instructions (Signed)
                                     Advice for Weight Management  -For most of us the best way to lose weight is by diet management. Generally speaking, diet management means consuming less calories intentionally which over time brings about progressive weight loss.  This can be achieved more effectively by restricting carbohydrate consumption to the minimum possible.  So, it is critically important to know your numbers: how much calorie you are consuming and how much calorie you need. More importantly, our carbohydrates sources should be unprocessed or minimally processed complex starch food items.   Sometimes, it is important to balance nutrition by increasing protein intake (animal or plant source), fruits, and vegetables.  -Sticking to a routine mealtime to eat 3 meals a day and avoiding unnecessary snacks is shown to have a big role in weight control. Under normal circumstances, the only time we lose real weight is when we are hungry, so allow hunger to take place- hunger means no food between meal times, only water.  It is not advisable to starve.   -It is better to avoid simple carbohydrates including: Cakes, Sweet Desserts, Ice Cream, Soda (diet and regular), Sweet Tea, Candies, Chips, Cookies, Store Bought Juices, Alcohol in Excess of  1-2 drinks a day, Lemonade,  Artificial Sweeteners, Doughnuts, Coffee Creamers, "Sugar-free" Products, etc, etc.  This is not a complete list.....    -Consulting with certified diabetes educators is proven to provide you with the most accurate and current information on diet.  Also, you may be  interested in discussing diet options/exchanges , we can schedule a visit with [name], RDN, CDE for individualized nutrition education.  -Exercise: If you are able: 30 -60 minutes a day ,4 days a week, or 150 minutes a week.  The longer the better.  Combine stretch, strength, and aerobic activities.  If you were told in the  past that you have high risk for cardiovascular diseases, you may seek evaluation by your heart doctor prior to initiating moderate to intense exercise programs.                                  Additional Care Considerations for Diabetes   -Diabetes  is a chronic disease.  The most important care consideration is regular follow-up with your diabetes care provider with the goal being avoiding or delaying its complications and to take advantage of advances in medications and technology.    -Type 2 diabetes is known to coexist with other important comorbidities such as high blood pressure and high cholesterol.  It is critical to control not only the diabetes but also the high blood pressure and high cholesterol to minimize and delay the risk of complications including coronary artery disease, stroke, amputations, blindness, etc.    - Studies showed that people with diabetes will benefit from a class of medications known as ACE inhibitors and statins.  Unless there are specific reasons not to be on these medications, the standard of care is to consider getting one from these groups of medications at an optimal doses.  These medications are generally considered safe and proven to help protect the heart and the kidneys.    - People with diabetes are encouraged to initiate and maintain regular follow-up with eye doctors, foot   doctors, dentists , and if necessary heart and kidney doctors.     - It is highly recommended that people with diabetes quit smoking or stay away from smoking, and get yearly  flu vaccine and pneumonia vaccine at least every 5 years.  One other important lifestyle recommendation is to ensure adequate sleep - at least 6-7 hours of uninterrupted sleep at night.  -Exercise: If you are able: 30 -60 minutes a day, 4 days a week, or 150 minutes a week.  The longer the better.  Combine stretch, strength, and aerobic activities.  If you were told in the past that you have high risk for  cardiovascular diseases, you may seek evaluation by your heart doctor prior to initiating moderate to intense exercise programs.          

## 2020-06-15 ENCOUNTER — Ambulatory Visit: Payer: Medicaid Other | Admitting: Dietician

## 2020-07-16 ENCOUNTER — Other Ambulatory Visit: Payer: Self-pay | Admitting: "Endocrinology

## 2020-07-25 ENCOUNTER — Other Ambulatory Visit: Payer: Self-pay

## 2020-07-25 ENCOUNTER — Ambulatory Visit (HOSPITAL_COMMUNITY)
Admission: RE | Admit: 2020-07-25 | Discharge: 2020-07-25 | Disposition: A | Discharge status: Home / Self Care | Payer: Medicaid Other | Source: Ambulatory Visit | Attending: Adult Health | Admitting: Adult Health

## 2020-07-25 ENCOUNTER — Other Ambulatory Visit (HOSPITAL_COMMUNITY): Payer: Self-pay | Admitting: Adult Health

## 2020-07-25 DIAGNOSIS — R059 Cough, unspecified: Secondary | ICD-10-CM

## 2020-08-07 ENCOUNTER — Telehealth (INDEPENDENT_AMBULATORY_CARE_PROVIDER_SITE_OTHER): Payer: Self-pay | Admitting: *Deleted

## 2020-08-07 ENCOUNTER — Encounter (INDEPENDENT_AMBULATORY_CARE_PROVIDER_SITE_OTHER): Payer: Self-pay | Admitting: Internal Medicine

## 2020-08-07 ENCOUNTER — Ambulatory Visit (INDEPENDENT_AMBULATORY_CARE_PROVIDER_SITE_OTHER): Payer: Medicaid Other | Admitting: Internal Medicine

## 2020-08-07 NOTE — Telephone Encounter (Signed)
Patient was a no show for his appointment today to see Dr.Rehman.

## 2020-08-08 ENCOUNTER — Ambulatory Visit: Payer: Medicaid Other | Admitting: Student

## 2020-08-08 NOTE — Progress Notes (Deleted)
Cardiology Office Note    Date:  08/08/2020   ID:  SALVADOR COUPE, DOB 18-Oct-1975, MRN [id number]  PCP:  Jani Gravel, MD  Cardiologist: Rozann Lesches, MD    No chief complaint on file.   History of Present Illness:    [name] is a 45 y.o. 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  He was last examined by Coletta Memos, NP in 05/2020 and reported still having palpitations and elevated heart rate with minimal activity. He denied any associated chest pain or dizziness. He was continued on Toprol-XL 100 mg daily along with Lisinopril 20 mg daily and PRN Lasix.  - echo?  Past Medical History:  Diagnosis Date  . Bipolar disorder ([place])   . Chronic respiratory failure ([place])   . Erectile dysfunction   . Essential hypertension   . GERD (gastroesophageal reflux disease)   . History of pneumonia   . Hyperlipidemia   . Interstitial lung disease ([facility])    Pulmonary eosinophilia  . Obesity   . Pulmonary eosinophilia ([place])   . Sinus tachycardia   . Tobacco abuse   . Type 2 diabetes mellitus ([place])     Past Surgical History:  Procedure Laterality Date  . APPENDECTOMY    . CHOLECYSTECTOMY N/A 07/11/2013   Procedure: LAPAROSCOPIC CHOLECYSTECTOMY;  Surgeon: Jamesetta So, MD;  Location: [facility];  Service: General;  Laterality: N/A;  . INCISION AND DRAINAGE ABSCESS / HEMATOMA OF BURSA / KNEE / THIGH    . KNEE SURGERY Bilateral   . VIDEO BRONCHOSCOPY Bilateral 02/09/2018   Procedure: VIDEO BRONCHOSCOPY WITHOUT FLUORO;  Surgeon: Brand Males, MD;  Location: [facility];  Service: Cardiopulmonary;  Laterality: Bilateral;    Current Medications: Outpatient Medications Prior to Visit  Medication Sig Dispense Refill   . Accu-Chek FastClix Lancets MISC USE TO TEST BLOOD SUGAR FOUR TIMES DAILY. 102 each 3  . acetaminophen (TYLENOL) 325 MG tablet Take 2 tablets (650 mg total) by mouth every 6 (six) hours as needed for mild pain (or Fever >/= 101). 12 tablet 0  . albuterol (VENTOLIN HFA) 108 (90 Base) MCG/ACT inhaler Inhale 2 puffs into the lungs every 6 (six) hours as needed. 18 g 0  . aspirin EC 81 MG EC tablet Take 1 tablet (81 mg total) by mouth daily with breakfast. 30 tablet 11  . Cholecalciferol (VITAMIN D3) 125 MCG (5000 UT) CAPS Take 1 capsule (5,000 Units total) by mouth daily. 90 capsule 0  . divalproex (DEPAKOTE) 500 MG DR tablet Take 2 tablets (1,000 mg total) by mouth at bedtime. 30 tablet 5  . furosemide (LASIX) 20 MG tablet Take 1 tablet (20 mg total) by mouth as needed for fluid or edema. 10 tablet 0  . glucose blood (ONETOUCH VERIO) test strip 1 each by Other route 2 (two) times daily. And lancets 2/day 100 each 12  . insulin aspart (NOVOLOG) 100 UNIT/ML FlexPen Inject 8-14 Units into the skin 3 (three) times daily with meals. 15 mL 2  . insulin glargine (LANTUS) 100 UNIT/ML injection Inject 0.3 mLs (30 Units total) into the skin daily. 15 mL 2  . Insulin Syringe-Needle U-100 (SURE COMFORT INSULIN SYRINGE) 30G X 1/2" 1 ML MISC USE AS DIRECTED PER PROVIDER TO INJECT INSULIN DAILY. 100 each  0  . lisinopril (ZESTRIL) 20 MG tablet Take 1 tablet (20 mg total) by mouth daily. 90 tablet 3  . metoprolol succinate (TOPROL-XL) 100 MG 24 hr tablet Take 1 tablet (100 mg total) by mouth daily. Take with or immediately following a meal. 90 tablet 3  . Multiple Vitamin (MULTIVITAMIN) tablet Take 1 tablet by mouth daily.    Marland Kitchen omeprazole (PRILOSEC) 20 MG capsule Take 20 mg by mouth at bedtime.    . risperidone (RISPERDAL) 4 MG tablet Take 4 mg by mouth at bedtime.     . simvastatin (ZOCOR) 20 MG tablet Take 20 mg by mouth daily.    . solifenacin (VESICARE) 5 MG tablet Take 5 mg by mouth at bedtime.     . SURE  COMFORT PEN NEEDLES 31G X 8 MM MISC USE TO TEST 4 TIMES DAILY. 100 each 6  . SYMBICORT 160-4.5 MCG/ACT inhaler Inhale 2 puffs into the lungs 2 (two) times daily. 1 Inhaler 12   No facility-administered medications prior to visit.     Allergies:   Patient has no known allergies.   Social History   Socioeconomic History  . Marital status: Single    Spouse name: Not on file  . Number of children: 0  . Years of education: Not on file  . Highest education level: Not on file  Occupational History  . Not on file  Tobacco Use  . Smoking status: Current Some Day Smoker    Packs/day: 0.50    Years: 10.00    Pack years: 5.00    Types: Cigarettes  . Smokeless tobacco: Never Used  Vaping Use  . Vaping Use: Never used  Substance and Sexual Activity  . Alcohol use: No  . Drug use: No  . Sexual activity: Never  Other Topics Concern  . Not on file  Social History Narrative  . Not on file   Social Determinants of Health   Financial Resource Strain: Not on file  Food Insecurity: Not on file  Transportation Needs: Not on file  Physical Activity: Not on file  Stress: Not on file  Social Connections: Not on file     Family History:  The patient's ***family history includes CAD in his maternal grandmother; Diabetes in his maternal grandmother and mother.   Review of Systems:   Please see the history of present illness.     General:  No chills, fever, night sweats or weight changes.  Cardiovascular:  No chest pain, dyspnea on exertion, edema, orthopnea, palpitations, paroxysmal nocturnal dyspnea. Dermatological: No rash, lesions/masses Respiratory: No cough, dyspnea Urologic: No hematuria, dysuria Abdominal:   No nausea, vomiting, diarrhea, bright red blood per rectum, melena, or hematemesis Neurologic:  No visual changes, wkns, changes in mental status. All other systems reviewed and are otherwise negative except as noted above.   Physical Exam:    VS:  There were no vitals  taken for this visit.   General: Well developed, well nourished,male appearing in no acute distress. Head: Normocephalic, atraumatic. Neck: No carotid bruits. JVD not elevated.  Lungs: Respirations regular and unlabored, without wheezes or rales.  Heart: ***Regular rate and rhythm. No S3 or S4.  No murmur, no rubs, or gallops appreciated. Abdomen: Appears non-distended. No obvious abdominal masses. Msk:  Strength and tone appear normal for age. No obvious joint deformities or effusions. Extremities: No clubbing or cyanosis. No edema.  Distal pedal pulses are 2+ bilaterally. Neuro: Alert and oriented X 3. Moves all extremities spontaneously. No focal deficits  noted. Psych:  Responds to questions appropriately with a normal affect. Skin: No rashes or lesions noted  Wt Readings from Last 3 Encounters:  06/13/20 196 lb 3.2 oz (89 kg)  05/02/20 198 lb 12.8 oz (90.2 kg)  04/13/20 199 lb 6.4 oz (90.4 kg)        Studies/Labs Reviewed:   EKG:  EKG is*** ordered today.  The ekg ordered today demonstrates ***  Recent Labs: 12/28/2019: Hemoglobin 11.1; Platelets 173 06/05/2020: ALT 4; BUN 8; Creatinine, Ser 0.83; Potassium 4.4; Sodium 138; TSH 3.360   Lipid Panel    Component Value Date/Time   CHOL 140 06/05/2020 1046   TRIG 178 (H) 06/05/2020 1046   HDL 38 (L) 06/05/2020 1046   CHOLHDL 3.7 06/05/2020 1046   LDLCALC 72 06/05/2020 1046    Additional studies/ records that were reviewed today include:   Zio Patch: 04/2019 Zio patch reviewed, 3 days analyzed.  Predominant rhythm is sinus.  Heart rate ranged from 74 bpm up to 144 bpm with average heart rate 95 bpm.  There were no sustained arrhythmias.  Rare PACs and PVCs noted making up less than 1% of total beats.  Echocardiogram: 12/2019 IMPRESSIONS    1. Left ventricular ejection fraction, by estimation, is 45 to 50%. The  left ventricle has mildly decreased function. The left ventricle  demonstrates global hypokinesis. There is  mild left ventricular  hypertrophy. Left ventricular diastolic parameters  were normal.  2. Right ventricular systolic function is normal. The right ventricular  size is normal. There is normal pulmonary artery systolic pressure. The  estimated right ventricular systolic pressure is 61.6 mmHg.  3. The mitral valve is grossly normal, mildly thickened. Mild mitral  valve regurgitation.  4. The aortic valve is tricuspid. Aortic valve regurgitation is not  visualized.  5. The inferior vena cava is dilated in size with >50% respiratory  variability, suggesting right atrial pressure of 8 mmHg.   Assessment:    No diagnosis found.   Plan:   In order of problems listed above:  1. ***    Shared Decision Making/Informed Consent:   {Are you ordering a CV Procedure (e.g. stress test, cath, DCCV, TEE, etc)?   Press F2        :[id number]}    Medication Adjustments/Labs and Tests Ordered: Current medicines are reviewed at length with the patient today.  Concerns regarding medicines are outlined above.  Medication changes, Labs and Tests ordered today are listed in the Patient Instructions below. There are no Patient Instructions on file for this visit.   Signed, Erma Heritage, PA-C  08/08/2020 7:46 AM    [facility] [address] [place] [employer], [place] [address] Phone: [phone] Fax: [phone]

## 2020-08-09 ENCOUNTER — Encounter: Payer: Self-pay | Admitting: Student

## 2020-08-10 ENCOUNTER — Other Ambulatory Visit: Payer: Self-pay | Admitting: "Endocrinology

## 2020-08-26 NOTE — Addendum Note (Signed)
Encounter addended by: Annie Paras on: 08/26/2020 11:39 AM  Actions taken: Letter saved

## 2020-09-07 ENCOUNTER — Other Ambulatory Visit: Payer: Self-pay | Admitting: "Endocrinology

## 2020-09-11 ENCOUNTER — Encounter: Payer: Self-pay | Admitting: "Endocrinology

## 2020-09-11 ENCOUNTER — Ambulatory Visit (INDEPENDENT_AMBULATORY_CARE_PROVIDER_SITE_OTHER): Payer: Medicaid Other | Admitting: "Endocrinology

## 2020-09-11 ENCOUNTER — Other Ambulatory Visit: Payer: Self-pay

## 2020-09-11 VITALS — BP 82/64 | HR 84 | Ht 73.0 in | Wt 181.4 lb

## 2020-09-11 DIAGNOSIS — E782 Mixed hyperlipidemia: Secondary | ICD-10-CM | POA: Diagnosis not present

## 2020-09-11 DIAGNOSIS — E1110 Type 2 diabetes mellitus with ketoacidosis without coma: Secondary | ICD-10-CM | POA: Diagnosis not present

## 2020-09-11 DIAGNOSIS — F17200 Nicotine dependence, unspecified, uncomplicated: Secondary | ICD-10-CM

## 2020-09-11 DIAGNOSIS — E559 Vitamin D deficiency, unspecified: Secondary | ICD-10-CM | POA: Diagnosis not present

## 2020-09-11 DIAGNOSIS — I10 Essential (primary) hypertension: Secondary | ICD-10-CM | POA: Diagnosis not present

## 2020-09-11 LAB — POCT GLYCOSYLATED HEMOGLOBIN (HGB A1C): HbA1c, POC (controlled diabetic range): 9.7 % — AB (ref 0.0–7.0)

## 2020-09-11 MED ORDER — NOVOLOG FLEXPEN 100 UNIT/ML ~~LOC~~ SOPN: 10.0000 [IU] | PEN_INJECTOR | Freq: Three times a day (TID) | SUBCUTANEOUS | 2 refills | Status: DC

## 2020-09-11 MED ORDER — INSULIN GLARGINE 100 UNIT/ML ~~LOC~~ SOLN: 35.0000 [IU] | Freq: Every day | SUBCUTANEOUS | 2 refills | Status: DC

## 2020-09-11 NOTE — Progress Notes (Signed)
Pt states he has been experiencing lightheadedness x 2-3 weeks when he stands.

## 2020-09-11 NOTE — Patient Instructions (Signed)

## 2020-09-11 NOTE — Progress Notes (Signed)
09/11/2020, 2:18 PM   Endocrinology follow-up note  Subjective:    Patient ID: [name], male    DOB: [birth year].  [name] is being seen in follow-up after he was seen in consultation for management of currently uncontrolled symptomatic diabetes requested by  Jani Gravel, MD.   Past Medical History:  Diagnosis Date  . Bipolar disorder ([place])   . Chronic respiratory failure ([place])   . Erectile dysfunction   . Essential hypertension   . GERD (gastroesophageal reflux disease)   . History of pneumonia   . Hyperlipidemia   . Interstitial lung disease (H[facility])    Pulmonary eosinophilia  . Obesity   . Pulmonary eosinophilia ([place])   . Sinus tachycardia   . Tobacco abuse   . Type 2 diabetes mellitus ([place])     Past Surgical History:  Procedure Laterality Date  . APPENDECTOMY    . CHOLECYSTECTOMY N/A 07/11/2013   Procedure: LAPAROSCOPIC CHOLECYSTECTOMY;  Surgeon: Jamesetta So, MD;  Location: [facility];  Service: General;  Laterality: N/A;  . INCISION AND DRAINAGE ABSCESS / HEMATOMA OF BURSA / KNEE / THIGH    . KNEE SURGERY Bilateral   . VIDEO BRONCHOSCOPY Bilateral 02/09/2018   Procedure: VIDEO BRONCHOSCOPY WITHOUT FLUORO;  Surgeon: Brand Males, MD;  Location: [facility];  Service: Cardiopulmonary;  Laterality: Bilateral;    Social History   Socioeconomic History  . Marital status: Single    Spouse name: Not on file  . Number of children: 0  . Years of education: Not on file  . Highest education level: Not on file  Occupational History  . Not on file  Tobacco Use  . Smoking status: Current Some Day Smoker    Packs/day: 0.50    Years: 10.00    Pack years: 5.00    Types: Cigarettes  . Smokeless tobacco: Never Used  Vaping Use  . Vaping Use: Never used  Substance and Sexual Activity  . Alcohol use: No  . Drug use: No  . Sexual activity: Never  Other Topics  Concern  . Not on file  Social History Narrative  . Not on file   Social Determinants of Health   Financial Resource Strain: Not on file  Food Insecurity: Not on file  Transportation Needs: Not on file  Physical Activity: Not on file  Stress: Not on file  Social Connections: Not on file    Family History  Problem Relation Age of Onset  . Diabetes Mother   . Diabetes Maternal Grandmother   . CAD Maternal Grandmother        heart attack in her 63s  . Pancreatitis Neg Hx   . Colon cancer Neg Hx     Outpatient Encounter Medications as of 09/11/2020  Medication Sig  . Accu-Chek FastClix Lancets MISC USE TO TEST BLOOD SUGAR FOUR TIMES DAILY.  Marland Kitchen acetaminophen (TYLENOL) 325 MG tablet Take 2 tablets (650 mg total) by mouth every 6 (six) hours as needed for mild pain (or Fever >/= 101).  Marland Kitchen albuterol (VENTOLIN HFA) 108 (90 Base) MCG/ACT inhaler Inhale 2 puffs into the  lungs every 6 (six) hours as needed.  Marland Kitchen aspirin EC 81 MG EC tablet Take 1 tablet (81 mg total) by mouth daily with breakfast.  . BD VEO INSULIN SYRINGE U/F 31G X 15/64" 1 ML MISC USE AS DIRECTED PER PROVIDER TO INJECT INSULIN DAILY.  Marland Kitchen Cholecalciferol (VITAMIN D3) 125 MCG (5000 UT) CAPS Take 1 capsule (5,000 Units total) by mouth daily.  . divalproex (DEPAKOTE) 500 MG DR tablet Take 2 tablets (1,000 mg total) by mouth at bedtime.  . furosemide (LASIX) 20 MG tablet Take 1 tablet (20 mg total) by mouth as needed for fluid or edema.  Marland Kitchen glucose blood (ONETOUCH VERIO) test strip 1 each by Other route 2 (two) times daily. And lancets 2/day  . insulin aspart (NOVOLOG FLEXPEN) 100 UNIT/ML FlexPen Inject 10-16 Units into the skin 3 (three) times daily before meals.  . insulin glargine (LANTUS) 100 UNIT/ML injection Inject 0.35 mLs (35 Units total) into the skin daily.  . Insulin Syringe-Needle U-100 (SURE COMFORT INSULIN SYRINGE) 30G X 1/2" 1 ML MISC USE AS DIRECTED PER PROVIDER TO INJECT INSULIN DAILY.  . Multiple Vitamin  (MULTIVITAMIN) tablet Take 1 tablet by mouth daily.  Marland Kitchen omeprazole (PRILOSEC) 20 MG capsule Take 20 mg by mouth at bedtime.  . risperidone (RISPERDAL) 4 MG tablet Take 4 mg by mouth at bedtime.   . simvastatin (ZOCOR) 20 MG tablet Take 20 mg by mouth daily.  . solifenacin (VESICARE) 5 MG tablet Take 5 mg by mouth at bedtime.   . SURE COMFORT PEN NEEDLES 31G X 8 MM MISC USE TO TEST 4 TIMES DAILY.  . SYMBICORT 160-4.5 MCG/ACT inhaler Inhale 2 puffs into the lungs 2 (two) times daily.  . [DISCONTINUED] insulin glargine (LANTUS) 100 UNIT/ML injection Inject 0.3 mLs (30 Units total) into the skin daily.  . [DISCONTINUED] lisinopril (ZESTRIL) 20 MG tablet Take 1 tablet (20 mg total) by mouth daily.  . [DISCONTINUED] metoprolol succinate (TOPROL-XL) 100 MG 24 hr tablet Take 1 tablet (100 mg total) by mouth daily. Take with or immediately following a meal.  . [DISCONTINUED] NOVOLOG FLEXPEN 100 UNIT/ML FlexPen INJECT 8-14 UNITS INTO THE SKIN THEE TIMES DAILY MEALS.   No facility-administered encounter medications on file as of 09/11/2020.    ALLERGIES: No Known Allergies  VACCINATION STATUS: Immunization History  Administered Date(s) Administered  . Influenza,inj,Quad PF,6+ Mos 06/06/2013  . Moderna Sars-Covid-2 Vaccination 08/13/2019, 09/14/2019    Diabetes He presents for his follow-up diabetic visit. He has type 2 diabetes mellitus. Onset time: He was diagnosed at approximate age of 17 years. His disease course has been worsening (He weighed approximately 270 pounds during the time of his diagnosis.  He has chronic uncontrolled diabetes with previous episodes of impending diabetic ketoacidosis, hyperosmolar state. ). There are no hypoglycemic associated symptoms. Pertinent negatives for hypoglycemia include no confusion, headaches, pallor or seizures. Associated symptoms include blurred vision, fatigue, polydipsia, polyuria and weight loss. Pertinent negatives for diabetes include no chest pain, no  polyphagia and no weakness. (He recently had pancreatitis which caused unintended weight loss.) There are no hypoglycemic complications. Symptoms are worsening. Diabetic complications include peripheral neuropathy and PVD. Risk factors for coronary artery disease include diabetes mellitus, dyslipidemia, family history, male sex, hypertension, sedentary lifestyle and tobacco exposure. Current diabetic treatment includes insulin injections (He is currently on Lantus 20 units twice daily, and NovoLog 5-15 units 3 times daily AC.). His weight is decreasing steadily. He is following a generally unhealthy diet. When asked about meal planning,  he reported none. He has not had a previous visit with a dietitian. He never participates in exercise. His home blood glucose trend is increasing steadily. His breakfast blood glucose range is generally >200 mg/dl. His lunch blood glucose range is generally >200 mg/dl. His dinner blood glucose range is generally >200 mg/dl. His bedtime blood glucose range is generally >200 mg/dl. His overall blood glucose range is >200 mg/dl. (He returns with his meter and logs showing significant inconsistency utilizing his prandial insulin.  He missed at least half of his NovoLog injection opportunities.  His point-of-care A1c is 9.7% increasing from 8.4%.  He did not have hypoglycemia.  His A1c was 12.7% in March 2021.   ) An ACE inhibitor/angiotensin II receptor blocker is being taken. Eye exam is current.  Hypertension This is a chronic problem. The problem is controlled. Associated symptoms include blurred vision. Pertinent negatives include no chest pain, headaches, neck pain, palpitations or shortness of breath. Risk factors for coronary artery disease include dyslipidemia, diabetes mellitus, male gender, sedentary lifestyle and smoking/tobacco exposure. Past treatments include ACE inhibitors. Hypertensive end-organ damage includes PVD.  Hyperlipidemia This is a chronic problem. The  current episode started more than 1 year ago. The problem is uncontrolled. Exacerbating diseases include diabetes. Associated symptoms include myalgias. Pertinent negatives include no chest pain or shortness of breath. Current antihyperlipidemic treatment includes statins. Risk factors for coronary artery disease include diabetes mellitus, dyslipidemia, family history, male sex, hypertension and a sedentary lifestyle.     Review of Systems  Constitutional: Positive for fatigue and weight loss. Negative for chills, fever and unexpected weight change.  HENT: Negative for dental problem, mouth sores and trouble swallowing.   Eyes: Positive for blurred vision. Negative for visual disturbance.  Respiratory: Negative for cough, choking, chest tightness, shortness of breath and wheezing.   Cardiovascular: Negative for chest pain, palpitations and leg swelling.  Gastrointestinal: Negative for abdominal distention, abdominal pain, constipation, diarrhea, nausea and vomiting.  Endocrine: Positive for polydipsia and polyuria. Negative for polyphagia.  Genitourinary: Negative for dysuria, flank pain, hematuria and urgency.  Musculoskeletal: Positive for myalgias. Negative for back pain, gait problem and neck pain.  Skin: Negative for pallor, rash and wound.  Neurological: Negative for seizures, syncope, weakness, numbness and headaches.  Psychiatric/Behavioral: Negative for confusion and dysphoric mood.    Objective:    Vitals with BMI 09/11/2020 06/13/2020 05/02/2020  Height 6\' 1"  6\' 1"  -  Weight 181 lbs 6 oz 196 lbs 3 oz -  BMI 62.13 08.65 -  Systolic 82 784 696  Diastolic 64 84 82  Pulse 84 100 -    BP (!) 82/64 (BP Location: Right Arm, Patient Position: Sitting) Comment: 76/52 R arm standing  Pulse 84   Ht 6\' 1"  (1.854 m)   Wt 181 lb 6.4 oz (82.3 kg)   BMI 23.93 kg/m   Wt Readings from Last 3 Encounters:  09/11/20 181 lb 6.4 oz (82.3 kg)  06/13/20 196 lb 3.2 oz (89 kg)  05/02/20 198 lb  12.8 oz (90.2 kg)      CMP     Component Value Date/Time   NA 138 06/05/2020 1046   K 4.4 06/05/2020 1046   CL 98 06/05/2020 1046   CO2 24 06/05/2020 1046   GLUCOSE 281 (H) 06/05/2020 1046   GLUCOSE 140 (H) 12/28/2019 0447   BUN 8 06/05/2020 1046   CREATININE 0.83 06/05/2020 1046   CREATININE 0.67 07/18/2019 1422   CALCIUM 9.2 06/05/2020 1046   PROT  7.4 06/05/2020 1046   ALBUMIN 3.3 (L) 06/05/2020 1046   AST 8 06/05/2020 1046   ALT 4 06/05/2020 1046   ALKPHOS 132 (H) 06/05/2020 1046   BILITOT 0.2 06/05/2020 1046   GFRNONAA 109 06/05/2020 1046   GFRAA 125 06/05/2020 1046     Diabetic Labs (most recent): Lab Results  Component Value Date   HGBA1C 9.7 (A) 09/11/2020   HGBA1C 8.4 06/13/2020   HGBA1C 8.5 (A) 03/01/2020     Lipid Panel ( most recent) Lipid Panel     Component Value Date/Time   CHOL 140 06/05/2020 1046   TRIG 178 (H) 06/05/2020 1046   HDL 38 (L) 06/05/2020 1046   CHOLHDL 3.7 06/05/2020 1046   LDLCALC 72 06/05/2020 1046   LABVLDL 30 06/05/2020 1046      Lab Results  Component Value Date   TSH 3.360 06/05/2020   TSH 1.111 12/26/2019   TSH 1.36 01/26/2019   TSH 1.78 10/13/2016   FREET4 1.06 06/05/2020   FREET4 0.86 01/26/2019      Assessment & Plan:   1. DM (diabetes mellitus) type 2, uncontrolled, with ketoacidosis (HCC)   - [name] has currently uncontrolled symptomatic type 2 DM since  45 years of age.  He returns with his meter and logs showing significant inconsistency utilizing his prandial insulin.  He missed at least half of his NovoLog injection opportunities.  His point-of-care A1c is 9.7% increasing from 8.4%.  He did not have hypoglycemia.  His A1c was 12.7% in March 2021.    - Recent labs reviewed. - I had a long discussion with him about the progressive nature of diabetes and the pathology behind its complications. -his diabetes is complicated by peripheral arterial disease, chronic smoking, pancreatitis, and he  remains at extremely high risk for more acute and chronic complications which include CAD, CVA, CKD, retinopathy, and neuropathy. These are all discussed in detail with him.  - I have counseled him on diet  and weight management  by adopting a carbohydrate restricted/protein rich diet. Patient is encouraged to switch to  unprocessed or minimally processed     complex starch and increased protein intake (animal or plant source), fruits, and vegetables. -  he is advised to stick to a routine mealtimes to eat 3 meals  a day and avoid unnecessary snacks ( to snack only to correct hypoglycemia).   - he acknowledges that there is a room for improvement in his food and drink choices. - Suggestion is made for him to avoid simple carbohydrates  from his diet including Cakes, Sweet Desserts, Ice Cream, Soda (diet and regular), Sweet Tea, Candies, Chips, Cookies, Store Bought Juices, Alcohol in Excess of  1-2 drinks a day, Artificial Sweeteners,  Coffee Creamer, and "Sugar-free" Products, Lemonade. This will help patient to have more stable blood glucose profile and potentially avoid unintended weight gain.   - he has been scheduled with Jearld Fenton, RDN, CDE for diabetes education.  - I have approached him with the following individualized plan to manage  his diabetes and patient agrees:   -In light of his presentation with chronically uncontrolled type 2 diabetes, he will continue to need intensive treatment with basal/bolus insulin in order for him to achieve and maintain control of diabetes to target.    -His history is suggestive of type 2 diabetes rather than type 1, will be considered for work-up for proper classification on subsequent visits.    -In the meantime, he is advised to increase  his Lantus to 35 units nightly, increase NovoLog to 10  units 3 times a day with meals  for pre-meal BG readings of 90-150mg /dl, plus patient specific correction dose for unexpected hyperglycemia above 150mg /dl,  associated with strict monitoring of glucose 4 times a day-before meals and at bedtime. - he is warned not to take insulin without proper monitoring per orders. - Adjustment parameters are given to him for hypo and hyperglycemia in writing. - he is encouraged to call clinic for blood glucose levels less than 70 or above 300 mg /dl. -He will be treated exclusively with insulin at this time.     - he is not a candidate for SGLT2 inhibitors nor incretin therapy due to his body habitus and recent pancreatitis.   He remains a very heavy chronic smoker.   - Specific targets for  A1c;  LDL, HDL,  and Triglycerides were discussed with the patient.  2) Blood Pressure /Hypertension:  His blood pressure is low 84Z-66A systolic over 63K to 16W diastolic.  He is advised to discontinue lisinopril and metoprolol.  Patient is symptomatic of lightheadedness at times.  He is advised to measure blood pressure at home and report if readings are greater than 140/90.     3) Lipids/Hyperlipidemia:   His previsit lipid panel showed improved LDL at 72, hypertriglyceridemia at 178.  He will continue to benefit from statin intervention.  He is advised to continue simvastatin 20 mg p.o. nightly. -Side effects and precautions discussed with him.  4)  Weight/Diet:  Body mass index is 23.93 kg/m.  - he is not a candidate for weight loss.  He is losing weight unintentionally.  Exercise, and detailed carbohydrates information provided  -  detailed on discharge instructions. -This patient may need Creon intervention if he continues to lose weight given his history of pancreatitis.  5) Chronic Care/Health Maintenance:  -he  is on ACEI/ARB and Statin medications and  is encouraged to initiate and continue to follow up with Ophthalmology, Dentist,  Podiatrist at least yearly or according to recommendations, and advised to  quit smoking. I have recommended yearly flu vaccine and pneumonia vaccine at least every 5 years;  moderate intensity exercise for up to 150 minutes weekly; and  sleep for at least 7 hours a day.   The patient was counseled on the dangers of tobacco use, and was advised to quit.  Reviewed strategies to maximize success, including removing cigarettes and smoking materials from environment.   He was evaluated by vascular surgery, further work-up did not show significant peripheral arterial disease.   - he is  advised to maintain close follow up with Jani Gravel, MD for primary care needs, as well as his other providers for optimal and coordinated care.    I spent 45 minutes in the care of the patient today including review of labs from [place], Lipids, Thyroid Function, Hematology (current and previous including abstractions from other facilities); face-to-face time discussing  his blood glucose readings/logs, discussing hypoglycemia and hyperglycemia episodes and symptoms, medications doses, his options of short and long term treatment based on the latest standards of care / guidelines;  discussion about incorporating lifestyle medicine;  and documenting the encounter.    Please refer to Patient Instructions for Blood Glucose Monitoring and Insulin/Medications Dosing Guide"  in media tab for additional information. Please  also refer to " Patient Self Inventory" in the Media  tab for reviewed elements of pertinent patient history. We discussed diabetes in depth, hypertension, hyperlipidemia, smoking  cessation, vitamin D deficiency. [name] participated in the discussions, expressed understanding, and voiced agreement with the above plans.  All questions were answered to his satisfaction. he is encouraged to contact clinic should he have any questions or concerns prior to his return visit.    Follow up plan: - Return in about 4 months (around 01/11/2021) for F/U with Pre-visit Labs, Meter, Logs, A1c here.Glade Lloyd, MD [facility] [address] [place], [place] [address] Phone: [phone]  Fax: [phone]    09/11/2020, 2:18 PM  This note was partially dictated with voice recognition software. Similar sounding words can be transcribed inadequately or may not  be corrected upon review.

## 2020-09-13 ENCOUNTER — Encounter (HOSPITAL_COMMUNITY): Payer: Self-pay | Admitting: *Deleted

## 2020-09-13 ENCOUNTER — Other Ambulatory Visit: Payer: Self-pay

## 2020-09-13 ENCOUNTER — Emergency Department (HOSPITAL_COMMUNITY)
Admission: EM | Admit: 2020-09-13 | Discharge: 2020-09-13 | Disposition: A | Discharge status: Home / Self Care | Payer: Medicaid Other | Attending: Emergency Medicine | Admitting: Emergency Medicine

## 2020-09-13 ENCOUNTER — Emergency Department (HOSPITAL_COMMUNITY): Payer: Medicaid Other

## 2020-09-13 DIAGNOSIS — R5383 Other fatigue: Secondary | ICD-10-CM | POA: Insufficient documentation

## 2020-09-13 DIAGNOSIS — E1165 Type 2 diabetes mellitus with hyperglycemia: Secondary | ICD-10-CM | POA: Diagnosis not present

## 2020-09-13 DIAGNOSIS — E1110 Type 2 diabetes mellitus with ketoacidosis without coma: Secondary | ICD-10-CM | POA: Insufficient documentation

## 2020-09-13 DIAGNOSIS — Z794 Long term (current) use of insulin: Secondary | ICD-10-CM | POA: Diagnosis not present

## 2020-09-13 DIAGNOSIS — R739 Hyperglycemia, unspecified: Secondary | ICD-10-CM | POA: Diagnosis present

## 2020-09-13 DIAGNOSIS — E860 Dehydration: Secondary | ICD-10-CM | POA: Diagnosis not present

## 2020-09-13 DIAGNOSIS — R42 Dizziness and giddiness: Secondary | ICD-10-CM | POA: Diagnosis not present

## 2020-09-13 DIAGNOSIS — F17210 Nicotine dependence, cigarettes, uncomplicated: Secondary | ICD-10-CM | POA: Diagnosis not present

## 2020-09-13 DIAGNOSIS — R059 Cough, unspecified: Secondary | ICD-10-CM | POA: Diagnosis not present

## 2020-09-13 DIAGNOSIS — Z7982 Long term (current) use of aspirin: Secondary | ICD-10-CM | POA: Diagnosis not present

## 2020-09-13 DIAGNOSIS — I10 Essential (primary) hypertension: Secondary | ICD-10-CM | POA: Diagnosis not present

## 2020-09-13 DIAGNOSIS — R000 Tachycardia, unspecified: Secondary | ICD-10-CM | POA: Diagnosis not present

## 2020-09-13 DIAGNOSIS — R062 Wheezing: Secondary | ICD-10-CM | POA: Diagnosis not present

## 2020-09-13 DIAGNOSIS — R0602 Shortness of breath: Secondary | ICD-10-CM | POA: Diagnosis not present

## 2020-09-13 DIAGNOSIS — Z79899 Other long term (current) drug therapy: Secondary | ICD-10-CM | POA: Diagnosis not present

## 2020-09-13 DIAGNOSIS — Z20822 Contact with and (suspected) exposure to covid-19: Secondary | ICD-10-CM | POA: Diagnosis not present

## 2020-09-13 LAB — URINALYSIS, ROUTINE W REFLEX MICROSCOPIC
Bacteria, UA: NONE SEEN
Bilirubin Urine: NEGATIVE
Glucose, UA: 50 mg/dL — AB
Ketones, ur: NEGATIVE mg/dL
Leukocytes,Ua: NEGATIVE
Nitrite: NEGATIVE
Protein, ur: NEGATIVE mg/dL
Specific Gravity, Urine: 1.011 (ref 1.005–1.030)
pH: 8 (ref 5.0–8.0)

## 2020-09-13 LAB — HEPATIC FUNCTION PANEL
ALT: 13 U/L (ref 0–44)
AST: 16 U/L (ref 15–41)
Albumin: 3.2 g/dL — ABNORMAL LOW (ref 3.5–5.0)
Alkaline Phosphatase: 100 U/L (ref 38–126)
Bilirubin, Direct: <0.1 mg/dL (ref 0.0–0.2)
Total Bilirubin: 0.4 mg/dL (ref 0.3–1.2)
Total Protein: 9.1 g/dL — ABNORMAL HIGH (ref 6.5–8.1)

## 2020-09-13 LAB — BASIC METABOLIC PANEL
Anion gap: 11 (ref 5–15)
BUN: 23 mg/dL — ABNORMAL HIGH (ref 6–20)
CO2: 24 mmol/L (ref 22–32)
Calcium: 9.3 mg/dL (ref 8.9–10.3)
Chloride: 94 mmol/L — ABNORMAL LOW (ref 98–111)
Creatinine, Ser: 1.23 mg/dL (ref 0.61–1.24)
GFR, Estimated: >60 mL/min (ref >60)
Glucose, Bld: 332 mg/dL — ABNORMAL HIGH (ref 70–99)
Potassium: 4.5 mmol/L (ref 3.5–5.1)
Sodium: 129 mmol/L — ABNORMAL LOW (ref 135–145)

## 2020-09-13 LAB — CBC
HCT: 36.5 % — ABNORMAL LOW (ref 39.0–52.0)
Hemoglobin: 11.3 g/dL — ABNORMAL LOW (ref 13.0–17.0)
MCH: 28.3 pg (ref 26.0–34.0)
MCHC: 31 g/dL (ref 30.0–36.0)
MCV: 91.5 fL (ref 80.0–100.0)
Platelets: 308 10*3/uL (ref 150–400)
RBC: 3.99 MIL/uL — ABNORMAL LOW (ref 4.22–5.81)
RDW: 14.3 % (ref 11.5–15.5)
WBC: 9.9 10*3/uL (ref 4.0–10.5)
nRBC: 0 % (ref 0.0–0.2)

## 2020-09-13 LAB — CBG MONITORING, ED: Glucose-Capillary: 162 mg/dL — ABNORMAL HIGH (ref 70–99)

## 2020-09-13 LAB — RESP PANEL BY RT-PCR (FLU A&B, COVID) ARPGX2
Influenza A by PCR: NEGATIVE
Influenza B by PCR: NEGATIVE
SARS Coronavirus 2 by RT PCR: NEGATIVE

## 2020-09-13 LAB — BRAIN NATRIURETIC PEPTIDE: B Natriuretic Peptide: 37 pg/mL (ref 0.0–100.0)

## 2020-09-13 MED ORDER — INSULIN ASPART 100 UNIT/ML ~~LOC~~ SOLN
8.0000 [IU] | Freq: Once | SUBCUTANEOUS | Status: AC
  Administered 2020-09-13: 8 [IU] via SUBCUTANEOUS
  Filled 2020-09-13: qty 1

## 2020-09-13 MED ORDER — AEROCHAMBER PLUS FLO-VU MEDIUM MISC
1.0000 | Freq: Once | Status: AC
  Administered 2020-09-13: 1
  Filled 2020-09-13 (×2): qty 1

## 2020-09-13 MED ORDER — SODIUM CHLORIDE 0.9 % IV BOLUS
500.0000 mL | Freq: Once | INTRAVENOUS | Status: AC
  Administered 2020-09-13: 500 mL via INTRAVENOUS

## 2020-09-13 MED ORDER — SODIUM CHLORIDE 0.9 % IV BOLUS
1000.0000 mL | Freq: Once | INTRAVENOUS | Status: AC
  Administered 2020-09-13: 1000 mL via INTRAVENOUS

## 2020-09-13 MED ORDER — ALBUTEROL SULFATE HFA 108 (90 BASE) MCG/ACT IN AERS
2.0000 | INHALATION_SPRAY | Freq: Once | RESPIRATORY_TRACT | Status: AC
  Administered 2020-09-13: 2 via RESPIRATORY_TRACT
  Filled 2020-09-13: qty 6.7

## 2020-09-13 NOTE — ED Provider Notes (Signed)
[place] Provider Note   CSN: [id number] Arrival date & time: 09/13/20  1212     History Chief Complaint  Patient presents with  . Dizziness    RAYNOR CALCATERRA is a 45 y.o. male with a history of hypertension, GERD, renal insufficiency, interstitial lung disease and type 2 diabetes, also history of atrial fibrillation with RVR presenting for evaluation of generalized weakness along with intermittent low blood pressures and lightheadedness with ambulation and shortness of breath and increased wheezing which is been occurring for the past several weeks.  He was seen by his nephrologist today and sent here for further evaluation of the symptoms.  He denies chest pain, abdominal pain, no nausea or vomiting also denies diarrhea, melena or hematochezia, he does have a history of anemia however.  He has had a poor appetite today in fact has had no p.o. intake today and is surprised to find his blood glucose level is over 300.  He has not taken his insulin today.  He denies fevers or chills.  He has had a nonproductive cough.  He denies orthopnea and peripheral edema.  HPI     Past Medical History:  Diagnosis Date  . Bipolar disorder ([place])   . Chronic respiratory failure ([place])   . Erectile dysfunction   . Essential hypertension   . GERD (gastroesophageal reflux disease)   . History of pneumonia   . Hyperlipidemia   . Interstitial lung disease (H[facility])    Pulmonary eosinophilia  . Obesity   . Pulmonary eosinophilia ([place])   . Sinus tachycardia   . Tobacco abuse   . Type 2 diabetes mellitus [facility])     Patient Active Problem List   Diagnosis Date Noted  . Vitamin D deficiency 03/08/2020  . Peripheral arterial disease ([place]) 03/01/2020  . Current smoker 12/28/2019  . Atrial fibrillation with RVR ([place]) 12/26/2019  . AKI (acute kidney injury) ([place]) 12/26/2019  . Essential hypertension, benign 12/26/2019  . Mixed hyperlipidemia 12/26/2019  . Closed nondisplaced  fracture of shaft of second metacarpal bone of right hand 12/07/2019  . 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 Chronic respiratory failure with hypoxia ([place]) 02/16/2018  . ILD (interstitial lung disease) ([place])   . Hypogonadism male 09/12/2014  . Uncontrolled type 1 diabetes mellitus ([place]) 02/18/2014  . Cholelithiasis 06/06/2013  . Bipolar 1 disorder, mixed, moderate ([place]) 06/04/2013  . Abnormal LFTs 06/03/2013  . Pancreatitis, acute 06/03/2013  . DKA (diabetic ketoacidosis) ([place]) 06/03/2013  . DM (diabetes mellitus) type 2, uncontrolled, with ketoacidosis ([place]) 06/03/2013    Past Surgical History:  Procedure Laterality Date  . APPENDECTOMY    . CHOLECYSTECTOMY N/A 07/11/2013   Procedure: LAPAROSCOPIC CHOLECYSTECTOMY;  Surgeon: Jamesetta So, MD;  Location: [facility];  Service: General;  Laterality: N/A;  . INCISION AND DRAINAGE ABSCESS / HEMATOMA OF BURSA / KNEE / THIGH    . KNEE SURGERY Bilateral   . VIDEO BRONCHOSCOPY Bilateral 02/09/2018   Procedure: VIDEO BRONCHOSCOPY WITHOUT FLUORO;  Surgeon: Brand Males, MD;  Location: [facility] END[facility];  Service: Cardiopulmonary;  Laterality: Bilateral;       Family History  Problem Relation Age of Onset  . Diabetes Mother   . Diabetes Maternal Grandmother   . CAD Maternal Grandmother        heart attack in her 58s  . Pancreatitis Neg Hx   . Colon cancer Neg Hx     Social History   Tobacco Use  . Smoking status: Current Some Day Smoker  Packs/day: 0.50    Years: 10.00    Pack years: 5.00    Types: Cigarettes  . Smokeless tobacco: Never Used  Vaping Use  . Vaping Use: Never used  Substance Use Topics  . Alcohol use: No  . Drug use: No    Home Medications Prior to Admission medications   Medication Sig Start Date End Date Taking? Authorizing Provider  Accu-Chek FastClix Lancets MISC USE TO TEST BLOOD SUGAR FOUR TIMES DAILY. 07/16/20  Yes Nida, Marella Chimes, MD  acetaminophen (TYLENOL) 325 MG tablet Take 2 tablets (650  mg total) by mouth every 6 (six) hours as needed for mild pain (or Fever >/= 101). 12/28/19  Yes Emokpae, Courage, MD  albuterol (VENTOLIN HFA) 108 (90 Base) MCG/ACT inhaler Inhale 2 puffs into the lungs every 6 (six) hours as needed. 12/28/19  Yes Roxan Hockey, MD  aspirin EC 81 MG EC tablet Take 1 tablet (81 mg total) by mouth daily with breakfast. 12/28/19  Yes Emokpae, Courage, MD  BD VEO INSULIN SYRINGE U/F 31G X 15/64" 1 ML MISC USE AS DIRECTED PER PROVIDER TO INJECT INSULIN DAILY. 09/07/20  Yes Nida, Marella Chimes, MD  chlorthalidone (HYGROTON) 25 MG tablet Take 25 mg by mouth daily. 09/11/20  Yes [provider]  Cholecalciferol (VITAMIN D3) 125 MCG (5000 UT) CAPS Take 1 capsule (5,000 Units total) by mouth daily. 03/08/20  Yes Nida, Marella Chimes, MD  divalproex (DEPAKOTE) 500 MG DR tablet Take 2 tablets (1,000 mg total) by mouth at bedtime. 12/28/19  Yes Emokpae, Courage, MD  fenofibrate (TRICOR) 145 MG tablet Take 145 mg by mouth daily. 09/11/20  Yes [provider]  furosemide (LASIX) 40 MG tablet Take 40 mg by mouth daily as needed for edema. 08/23/20  Yes [provider]  glucose blood (ONETOUCH VERIO) test strip 1 each by Other route 2 (two) times daily. And lancets 2/day 12/28/19  Yes Emokpae, Courage, MD  insulin aspart (NOVOLOG FLEXPEN) 100 UNIT/ML FlexPen Inject 10-16 Units into the skin 3 (three) times daily before meals. 09/11/20  Yes Nida, Marella Chimes, MD  insulin glargine (LANTUS) 100 UNIT/ML injection Inject 0.35 mLs (35 Units total) into the skin daily. 09/11/20  Yes Nida, Marella Chimes, MD  Insulin Syringe-Needle U-100 (SURE COMFORT INSULIN SYRINGE) 30G X 1/2" 1 ML MISC USE AS DIRECTED PER PROVIDER TO INJECT INSULIN DAILY. 12/28/19  Yes Roxan Hockey, MD  Multiple Vitamin (MULTIVITAMIN) tablet Take 1 tablet by mouth daily.   Yes [provider]  omeprazole (PRILOSEC) 20 MG capsule Take 20 mg by mouth at bedtime.   Yes [provider]  risperidone (RISPERDAL) 4 MG tablet Take 4 mg by mouth at bedtime.    Yes [provider]  simvastatin (ZOCOR) 20 MG tablet Take 20 mg by mouth daily.   Yes [provider]  solifenacin (VESICARE) 5 MG tablet Take 5 mg by mouth at bedtime.    Yes [provider]  SURE COMFORT PEN NEEDLES 31G X 8 MM MISC USE TO TEST 4 TIMES DAILY. 03/14/20  Yes Nida, Marella Chimes, MD  SYMBICORT 160-4.5 MCG/ACT inhaler Inhale 2 puffs into the lungs 2 (two) times daily. 12/28/19  Yes Roxan Hockey, MD    Allergies    Patient has no known allergies.  Review of Systems   Review of Systems  Constitutional: Positive for fatigue. Negative for chills and fever.  Eyes: Negative.   Respiratory: Positive for cough, shortness of breath and wheezing. Negative for chest tightness.  Cardiovascular: Negative for chest pain.  Gastrointestinal: Negative for abdominal pain, nausea and vomiting.  Genitourinary: Negative.   Musculoskeletal: Negative for arthralgias, joint swelling and neck pain.  Skin: Negative.  Negative for rash and wound.  Neurological: Positive for weakness and light-headedness. Negative for dizziness, numbness and headaches.  Psychiatric/Behavioral: Negative.     Physical Exam Updated Vital Signs BP (!) 143/98   Pulse (!) 105   Temp 98.3 F (36.8 C) (Oral)   Resp (!) 29   Ht 6\' 1"  (1.854 m)   Wt 82.6 kg   SpO2 94%   BMI 24.01 kg/m   Physical Exam Vitals and nursing note reviewed.  Constitutional:      Appearance: He is well-developed.  HENT:     Head: Normocephalic and atraumatic.  Eyes:     Conjunctiva/sclera: Conjunctivae normal.  Cardiovascular:     Rate and Rhythm: Regular rhythm. Tachycardia present.     Heart sounds: Normal heart sounds.  Pulmonary:     Effort: Pulmonary effort is normal.     Breath sounds: Wheezing and rhonchi present. No rales.  Abdominal:     General: Bowel sounds are normal.     Palpations: Abdomen is  soft.     Tenderness: There is no abdominal tenderness. There is no guarding.  Musculoskeletal:        General: Normal range of motion.     Cervical back: Normal range of motion.     Right lower leg: No edema.     Left lower leg: No edema.  Skin:    General: Skin is warm and dry.  Neurological:     Mental Status: He is alert.     ED Results / Procedures / Treatments   Labs (all labs ordered are listed, but only abnormal results are displayed) Labs Reviewed  BASIC METABOLIC PANEL - Abnormal; Notable for the following components:      Result Value   Sodium 129 (*)    Chloride 94 (*)    Glucose, Bld 332 (*)    BUN 23 (*)    All other components within normal limits  CBC - Abnormal; Notable for the following components:   RBC 3.99 (*)    Hemoglobin 11.3 (*)    HCT 36.5 (*)    All other components within normal limits  URINALYSIS, ROUTINE W REFLEX MICROSCOPIC - Abnormal; Notable for the following components:   Glucose, UA 50 (*)    Hgb urine dipstick SMALL (*)    All other components within normal limits  HEPATIC FUNCTION PANEL - Abnormal; Notable for the following components:   Total Protein 9.1 (*)    Albumin 3.2 (*)    All other components within normal limits  CBG MONITORING, ED - Abnormal; Notable for the following components:   Glucose-Capillary 162 (*)    All other components within normal limits  RESP PANEL BY RT-PCR (FLU A&B, COVID) ARPGX2  BRAIN NATRIURETIC PEPTIDE  CBG MONITORING, ED    EKG EKG Interpretation  Date/Time:  Thursday September 13 2020 13:25:47 EDT Ventricular Rate:  128 PR Interval:  120 QRS Duration: 80 QT Interval:  302 QTC Calculation: 440 R Axis:   76 Text Interpretation: Sinus tachycardia Otherwise normal ECG Since last tracing rate slower now in sinus rhythm Confirmed by Noemi Chapel [id number]) on 09/13/2020 9:59:59 PM   Radiology DG Chest Port 1 View  Result Date: 09/13/2020 CLINICAL DATA:  Dizzy, lightheaded EXAM: PORTABLE CHEST 1  VIEW COMPARISON:  07/25/2020 FINDINGS: Severe bilateral  reticulonodular interstitial opacities compatible with interstitial lung disease/pulmonary fibrosis. Normal heart size and vascularity. No definite superimposed acute pneumonia, collapse or consolidation. Negative for edema, large effusion or pneumothorax. Trachea midline. IMPRESSION: Stable chronic interstitial lung disease/fibrosis. Electronically Signed   By: Jerilynn Mages.  Shick M.D.   On: 09/13/2020 16:59    Procedures Procedures   Medications Ordered in ED Medications  sodium chloride 0.9 % bolus 500 mL (0 mLs Intravenous Stopped 09/13/20 2000)  insulin aspart (novoLOG) injection 8 Units (8 Units Subcutaneous Given 09/13/20 1708)  albuterol (VENTOLIN HFA) 108 (90 Base) MCG/ACT inhaler 2 puff (2 puffs Inhalation Given 09/13/20 1708)  AeroChamber Plus Flo-Vu Medium MISC 1 each (1 each Other Given 09/13/20 1709)  sodium chloride 0.9 % bolus 1,000 mL (0 mLs Intravenous Stopped 09/13/20 2117)    ED Course  I have reviewed the triage vital signs and the nursing notes.  Pertinent labs & imaging results that were available during my care of the patient were reviewed by me and considered in my medical decision making (see chart for details).    MDM Rules/Calculators/A&P                          Pt was given IV fluids and insulin with significant improvement in blood glucose obtained.  ekg sig for sinus tachy, no a fib, he is not in dka.  Orthostatics obtained after IV fluids and he was sx free despite elevation in pulse rate.  Albuterol mdi given - wheezing resolved.    At reexam, pt states he feels much better, just thinks he was dehydrated, was ready to go home.  He continues to have borderline tachycardia.  Denies sob, no chest pain, tachy possibly SE from albuterol given.  Strict return precautions outlined with pt.  Discussed with Dr. Sabra Heck prior to dc home.  Final Clinical Impression(s) / ED Diagnoses Final diagnoses:  Hyperglycemia   Dehydration  Fatigue, unspecified type    Rx / DC Orders ED Discharge Orders    None       Landis Martins 09/13/20 2303    Noemi Chapel, MD 09/21/20 [phone]

## 2020-09-13 NOTE — ED Provider Notes (Signed)
Emergency Medicine Provider Triage Evaluation Note  [name] , a 45 y.o. male  was evaluated in triage.  Pt complains of  Here with low blood pressure and dizziness sent in from his Nephrologist for dizziness.  Ongoing for a few weeks + sob with exertion , dizziness, weakness esp with sitting or standing. Denies melena or hematochezia. Hx of anemia.    Review of Systems  Positive: Weakness, dizzy sob Negative:  melena  Physical Exam  BP 121/83   Pulse (!) 124   Temp 98.3 F (36.8 C) (Oral)   Resp 18   SpO2 94%  Gen:   Awake, no distress - pale HEENT:  Atraumatic  Resp:  Normal effort  Cardiac:  Normal rate  Abd:   Nondistended, nontender  MSK:   Moves extremities without difficulty  Neuro:  Speech clear   Medical Decision Making  Medically screening exam initiated at 2:34 PM.  Appropriate orders placed.  [name] was informed that the remainder of the evaluation will be completed by another provider, this initial triage assessment does not replace that evaluation, and the importance of remaining in the ED until their evaluation is complete.  Clinical Impression   dizzy weak labs ordered    Margarita Mail, PA-C 09/13/20 1439    Fredia Sorrow, MD 09/14/20 1719

## 2020-09-13 NOTE — ED Triage Notes (Signed)
Lightheaded and dizzy for 2 weeks

## 2020-09-13 NOTE — Discharge Instructions (Addendum)
Since you are feeling better and your blood glucose level has improved, it is safe for you to go home this evening.  Make sure you are drinking plenty of fluids and keep a close watch on your blood sugar to avoid hyperglycemia.  Return here if you develop any worsening symptoms.

## 2020-09-13 NOTE — ED Notes (Signed)
Patient stated he was on "at home o2 at 2lpm at night".  Patient kept asking for oxygen.  I stated we needed to see what his o2 sat was on room air.  Advised patient his o2 sat was 98% on room air and we would wait to see what doctor recommended.

## 2020-09-27 ENCOUNTER — Other Ambulatory Visit (HOSPITAL_COMMUNITY)
Admission: RE | Admit: 2020-09-27 | Discharge: 2020-09-27 | Disposition: A | Discharge status: Home / Self Care | Payer: Medicaid Other | Source: Other Acute Inpatient Hospital | Attending: Nephrology | Admitting: Nephrology

## 2020-09-27 DIAGNOSIS — N170 Acute kidney failure with tubular necrosis: Secondary | ICD-10-CM | POA: Insufficient documentation

## 2020-09-27 DIAGNOSIS — I951 Orthostatic hypotension: Secondary | ICD-10-CM | POA: Diagnosis present

## 2020-09-27 LAB — CBC
HCT: 36.8 % — ABNORMAL LOW (ref 39.0–52.0)
Hemoglobin: 11.5 g/dL — ABNORMAL LOW (ref 13.0–17.0)
MCH: 27.9 pg (ref 26.0–34.0)
MCHC: 31.3 g/dL (ref 30.0–36.0)
MCV: 89.3 fL (ref 80.0–100.0)
Platelets: 291 10*3/uL (ref 150–400)
RBC: 4.12 MIL/uL — ABNORMAL LOW (ref 4.22–5.81)
RDW: 14.6 % (ref 11.5–15.5)
WBC: 8.5 10*3/uL (ref 4.0–10.5)
nRBC: 0 % (ref 0.0–0.2)

## 2020-09-27 LAB — RENAL FUNCTION PANEL
Albumin: 3.4 g/dL — ABNORMAL LOW (ref 3.5–5.0)
Anion gap: 9 (ref 5–15)
BUN: 23 mg/dL — ABNORMAL HIGH (ref 6–20)
CO2: 26 mmol/L (ref 22–32)
Calcium: 9.8 mg/dL (ref 8.9–10.3)
Chloride: 98 mmol/L (ref 98–111)
Creatinine, Ser: 1.16 mg/dL (ref 0.61–1.24)
GFR, Estimated: >60 mL/min (ref >60)
Glucose, Bld: 190 mg/dL — ABNORMAL HIGH (ref 70–99)
Phosphorus: 2.8 mg/dL (ref 2.5–4.6)
Potassium: 3.8 mmol/L (ref 3.5–5.1)
Sodium: 133 mmol/L — ABNORMAL LOW (ref 135–145)

## 2020-09-27 LAB — FERRITIN: Ferritin: 360 ng/mL — ABNORMAL HIGH (ref 24–336)

## 2020-09-27 LAB — FOLATE: Folate: 17.7 ng/mL (ref >5.9)

## 2020-09-27 LAB — VITAMIN B12: Vitamin B-12: 651 pg/mL (ref 180–914)

## 2020-09-27 LAB — IRON AND TIBC
Iron: 31 ug/dL — ABNORMAL LOW (ref 45–182)
Saturation Ratios: 7 % — ABNORMAL LOW (ref 17.9–39.5)
TIBC: 433 ug/dL (ref 250–450)
UIBC: 402 ug/dL

## 2020-09-27 LAB — PROTEIN / CREATININE RATIO, URINE
Creatinine, Urine: 270.58 mg/dL
Protein Creatinine Ratio: 0.15 mg/mg{Cre} (ref 0.00–0.15)
Total Protein, Urine: 41 mg/dL

## 2020-10-02 ENCOUNTER — Encounter (INDEPENDENT_AMBULATORY_CARE_PROVIDER_SITE_OTHER): Payer: Self-pay | Admitting: *Deleted

## 2020-10-31 ENCOUNTER — Other Ambulatory Visit: Payer: Self-pay

## 2020-10-31 ENCOUNTER — Encounter (HOSPITAL_COMMUNITY)
Admission: RE | Admit: 2020-10-31 | Discharge: 2020-10-31 | Disposition: A | Discharge status: Home / Self Care | Payer: Medicaid Other | Source: Ambulatory Visit | Attending: Nephrology | Admitting: Nephrology

## 2020-10-31 DIAGNOSIS — Z79899 Other long term (current) drug therapy: Secondary | ICD-10-CM | POA: Diagnosis not present

## 2020-10-31 DIAGNOSIS — E119 Type 2 diabetes mellitus without complications: Secondary | ICD-10-CM | POA: Insufficient documentation

## 2020-10-31 MED ORDER — SODIUM CHLORIDE 0.9 % IV SOLN
510.0000 mg | INTRAVENOUS | Status: DC
  Administered 2020-10-31: 510 mg via INTRAVENOUS
  Filled 2020-10-31: qty 510

## 2020-10-31 MED ORDER — SODIUM CHLORIDE 0.9 % IV SOLN
Freq: Once | INTRAVENOUS | Status: AC
  Administered 2020-10-31: 250 mL via INTRAVENOUS

## 2020-11-01 ENCOUNTER — Encounter (HOSPITAL_COMMUNITY): Payer: Self-pay | Admitting: Emergency Medicine

## 2020-11-01 ENCOUNTER — Other Ambulatory Visit: Payer: Self-pay

## 2020-11-01 ENCOUNTER — Inpatient Hospital Stay (HOSPITAL_COMMUNITY)
Admission: EM | Admit: 2020-11-01 | Discharge: 2020-11-03 | DRG: 312 | Disposition: A | Discharge status: Home / Self Care | Payer: Medicaid Other | Attending: Internal Medicine | Admitting: Internal Medicine

## 2020-11-01 ENCOUNTER — Emergency Department (HOSPITAL_COMMUNITY): Payer: Medicaid Other

## 2020-11-01 DIAGNOSIS — Z794 Long term (current) use of insulin: Secondary | ICD-10-CM

## 2020-11-01 DIAGNOSIS — I951 Orthostatic hypotension: Principal | ICD-10-CM | POA: Diagnosis present

## 2020-11-01 DIAGNOSIS — Z833 Family history of diabetes mellitus: Secondary | ICD-10-CM

## 2020-11-01 DIAGNOSIS — Z8701 Personal history of pneumonia (recurrent): Secondary | ICD-10-CM

## 2020-11-01 DIAGNOSIS — Z7982 Long term (current) use of aspirin: Secondary | ICD-10-CM

## 2020-11-01 DIAGNOSIS — F17200 Nicotine dependence, unspecified, uncomplicated: Secondary | ICD-10-CM

## 2020-11-01 DIAGNOSIS — R0682 Tachypnea, not elsewhere classified: Secondary | ICD-10-CM

## 2020-11-01 DIAGNOSIS — E1110 Type 2 diabetes mellitus with ketoacidosis without coma: Secondary | ICD-10-CM

## 2020-11-01 DIAGNOSIS — J9611 Chronic respiratory failure with hypoxia: Secondary | ICD-10-CM | POA: Diagnosis present

## 2020-11-01 DIAGNOSIS — Z7951 Long term (current) use of inhaled steroids: Secondary | ICD-10-CM

## 2020-11-01 DIAGNOSIS — Z20822 Contact with and (suspected) exposure to covid-19: Secondary | ICD-10-CM | POA: Diagnosis present

## 2020-11-01 DIAGNOSIS — K219 Gastro-esophageal reflux disease without esophagitis: Secondary | ICD-10-CM | POA: Diagnosis not present

## 2020-11-01 DIAGNOSIS — J8489 Other specified interstitial pulmonary diseases: Secondary | ICD-10-CM | POA: Diagnosis present

## 2020-11-01 DIAGNOSIS — D509 Iron deficiency anemia, unspecified: Secondary | ICD-10-CM | POA: Diagnosis present

## 2020-11-01 DIAGNOSIS — G901 Familial dysautonomia [Riley-Day]: Secondary | ICD-10-CM | POA: Diagnosis present

## 2020-11-01 DIAGNOSIS — Z79899 Other long term (current) drug therapy: Secondary | ICD-10-CM

## 2020-11-01 DIAGNOSIS — R55 Syncope and collapse: Secondary | ICD-10-CM | POA: Diagnosis present

## 2020-11-01 DIAGNOSIS — I10 Essential (primary) hypertension: Secondary | ICD-10-CM | POA: Diagnosis present

## 2020-11-01 DIAGNOSIS — F319 Bipolar disorder, unspecified: Secondary | ICD-10-CM | POA: Diagnosis present

## 2020-11-01 DIAGNOSIS — E1169 Type 2 diabetes mellitus with other specified complication: Secondary | ICD-10-CM | POA: Diagnosis present

## 2020-11-01 DIAGNOSIS — F17210 Nicotine dependence, cigarettes, uncomplicated: Secondary | ICD-10-CM | POA: Diagnosis present

## 2020-11-01 DIAGNOSIS — E785 Hyperlipidemia, unspecified: Secondary | ICD-10-CM

## 2020-11-01 DIAGNOSIS — E1165 Type 2 diabetes mellitus with hyperglycemia: Secondary | ICD-10-CM | POA: Diagnosis present

## 2020-11-01 DIAGNOSIS — J849 Interstitial pulmonary disease, unspecified: Secondary | ICD-10-CM

## 2020-11-01 DIAGNOSIS — E782 Mixed hyperlipidemia: Secondary | ICD-10-CM | POA: Diagnosis present

## 2020-11-01 DIAGNOSIS — Z8249 Family history of ischemic heart disease and other diseases of the circulatory system: Secondary | ICD-10-CM

## 2020-11-01 LAB — URINALYSIS, ROUTINE W REFLEX MICROSCOPIC
Bacteria, UA: NONE SEEN
Bilirubin Urine: NEGATIVE
Glucose, UA: NEGATIVE mg/dL
Hgb urine dipstick: NEGATIVE
Ketones, ur: NEGATIVE mg/dL
Leukocytes,Ua: NEGATIVE
Nitrite: NEGATIVE
Protein, ur: 30 mg/dL — AB
Specific Gravity, Urine: 1.018 (ref 1.005–1.030)
pH: 7 (ref 5.0–8.0)

## 2020-11-01 LAB — COMPREHENSIVE METABOLIC PANEL
ALT: 11 U/L (ref 0–44)
AST: 15 U/L (ref 15–41)
Albumin: 3.1 g/dL — ABNORMAL LOW (ref 3.5–5.0)
Alkaline Phosphatase: 72 U/L (ref 38–126)
Anion gap: 6 (ref 5–15)
BUN: 20 mg/dL (ref 6–20)
CO2: 29 mmol/L (ref 22–32)
Calcium: 9.3 mg/dL (ref 8.9–10.3)
Chloride: 102 mmol/L (ref 98–111)
Creatinine, Ser: 1.08 mg/dL (ref 0.61–1.24)
GFR, Estimated: >60 mL/min (ref >60)
Glucose, Bld: 123 mg/dL — ABNORMAL HIGH (ref 70–99)
Potassium: 3.7 mmol/L (ref 3.5–5.1)
Sodium: 137 mmol/L (ref 135–145)
Total Bilirubin: 0.5 mg/dL (ref 0.3–1.2)
Total Protein: 8.1 g/dL (ref 6.5–8.1)

## 2020-11-01 LAB — CBC WITH DIFFERENTIAL/PLATELET
Abs Immature Granulocytes: 0.05 10*3/uL (ref 0.00–0.07)
Basophils Absolute: 0 10*3/uL (ref 0.0–0.1)
Basophils Relative: 0 %
Eosinophils Absolute: 0.3 10*3/uL (ref 0.0–0.5)
Eosinophils Relative: 3 %
HCT: 30.9 % — ABNORMAL LOW (ref 39.0–52.0)
Hemoglobin: 9.6 g/dL — ABNORMAL LOW (ref 13.0–17.0)
Immature Granulocytes: 1 %
Lymphocytes Relative: 16 %
Lymphs Abs: 1.7 10*3/uL (ref 0.7–4.0)
MCH: 27.5 pg (ref 26.0–34.0)
MCHC: 31.1 g/dL (ref 30.0–36.0)
MCV: 88.5 fL (ref 80.0–100.0)
Monocytes Absolute: 0.7 10*3/uL (ref 0.1–1.0)
Monocytes Relative: 6 %
Neutro Abs: 7.7 10*3/uL (ref 1.7–7.7)
Neutrophils Relative %: 74 %
Platelets: 269 10*3/uL (ref 150–400)
RBC: 3.49 MIL/uL — ABNORMAL LOW (ref 4.22–5.81)
RDW: 14.8 % (ref 11.5–15.5)
WBC: 10.5 10*3/uL (ref 4.0–10.5)
nRBC: 0 % (ref 0.0–0.2)

## 2020-11-01 LAB — RESP PANEL BY RT-PCR (FLU A&B, COVID) ARPGX2
Influenza A by PCR: NEGATIVE
Influenza B by PCR: NEGATIVE
SARS Coronavirus 2 by RT PCR: NEGATIVE

## 2020-11-01 LAB — CBG MONITORING, ED: Glucose-Capillary: 122 mg/dL — ABNORMAL HIGH (ref 70–99)

## 2020-11-01 LAB — LACTIC ACID, PLASMA: Lactic Acid, Venous: 1.2 mmol/L (ref 0.5–1.9)

## 2020-11-01 LAB — D-DIMER, QUANTITATIVE: D-Dimer, Quant: 0.36 ug/mL-FEU (ref 0.00–0.50)

## 2020-11-01 LAB — VALPROIC ACID LEVEL: Valproic Acid Lvl: 15 ug/mL — ABNORMAL LOW (ref 50.0–100.0)

## 2020-11-01 MED ORDER — SODIUM CHLORIDE 0.9 % IV BOLUS
1000.0000 mL | Freq: Once | INTRAVENOUS | Status: AC
  Administered 2020-11-01: 1000 mL via INTRAVENOUS

## 2020-11-01 MED ORDER — ALBUTEROL SULFATE HFA 108 (90 BASE) MCG/ACT IN AERS
2.0000 | INHALATION_SPRAY | Freq: Once | RESPIRATORY_TRACT | Status: AC
  Administered 2020-11-01: 2 via RESPIRATORY_TRACT
  Filled 2020-11-01: qty 6.7

## 2020-11-01 NOTE — ED Triage Notes (Addendum)
EMS called out for 2 episodes of seizure-like activity witnessed by family, lasting approx 5 minutes each. Upon arrival, EMS states pt was A/Ox4. Vitals WDL except bp was 80s/50s. CBG WDL. EMS reports orthostatic hypotension. O2 sats 85% on arrival to ER. Placed on 2L [place]. Pt c/o dizziness

## 2020-11-01 NOTE — ED Notes (Signed)
Pt on 2L O2 [place] per pt request and RN approval

## 2020-11-01 NOTE — ED Notes (Signed)
Pt tolerating oral intake of fluids at this time. Denies nausea/vomiting

## 2020-11-01 NOTE — ED Notes (Addendum)
Pulse Ox 92% while ambulating on room air.

## 2020-11-01 NOTE — ED Provider Notes (Signed)
[facility] EMERGENCY DEPARTMENT Provider Note   CSN: [id number] Arrival date & time: 11/01/20  1304     History Chief Complaint  Patient presents with  . Seizure-like activity    [name] is a 45 y.o. male with a history as outlined below including chronic kidney disease, hypertension, GERD, interstitial lung disease, type 2 diabetes and proximal atrial relation, also reported bipolar disorder presenting for evaluation of outpatient believes to have been prior to syncopal events this morning.  He was visiting his cousins home when he had 2 episodes of syncope while sitting down, cousin reported to have  witnessed some shaking movements during these events.  Patient states he has been getting increasingly lightheaded with position changes similar to how he felt when he was seen here in April for dehydration.  He has no reason for being dehydrated however, stating that he has been drinking plenty of fluids.  He denies chest pain, palpitations, shortness of breath also no nausea or vomiting, no abdominal pain.  He does have a history of childhood seizures.  When asked about his Depakote medication, he states he takes this for bipolar disorder not seizure disorder.  At this moment endorses generalized weakness and no other complaints.  Pt also states diagnosed with iron deficiency anemia, given first iron infusion yesterday.   Of note, he is scheduled to reestablish care with a pulmonologist, Dr. Halford Chessman in 6 days for evaluation of his known interstitial lung disease.  Upon first arrival patient's oxygen saturation was 83% on room air.  He was placed on 2 L nasal cannula and oxygen saturations responded appropriately.   8:23 PM Spoke with Berline Chough, pt's brother in law with pt's permission who witnessed episodes, each lasting about 3 minutes, 5 minutes apart.  Describes pt's head fell back with eyes open, slumped, drooled, no seizure like activity.     The history is provided by  the patient.       Past Medical History:  Diagnosis Date  . Bipolar disorder ([place])   . Chronic respiratory failure ([place])   . Erectile dysfunction   . Essential hypertension   . GERD (gastroesophageal reflux disease)   . History of pneumonia   . Hyperlipidemia   . Interstitial lung disease (HCC)    Pulmonary eosinophilia  . Obesity   . Pulmonary eosinophilia ([place])   . Sinus tachycardia   . Tobacco abuse   . Type 2 diabetes mellitus [facility])     Patient Active Problem List   Diagnosis Date Noted  . Vitamin D deficiency 03/08/2020  . Peripheral arterial disease ([place]) 03/01/2020  . Current smoker 12/28/2019  . Atrial fibrillation with RVR ([place]) 12/26/2019  . AKI (acute kidney injury) ([place]) 12/26/2019  . Essential hypertension, benign 12/26/2019  . Mixed hyperlipidemia 12/26/2019  . Closed nondisplaced fracture of shaft of second metacarpal bone of right hand 12/07/2019  . 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 Chronic respiratory failure with hypoxia ([place]) 02/16/2018  . ILD (interstitial lung disease) ([place])   . Hypogonadism male 09/12/2014  . Uncontrolled type 1 diabetes mellitus ([place]) 02/18/2014  . Cholelithiasis 06/06/2013  . Bipolar 1 disorder, mixed, moderate ([place]) 06/04/2013  . Abnormal LFTs 06/03/2013  . Pancreatitis, acute 06/03/2013  . DKA (diabetic ketoacidosis) ([place]) 06/03/2013  . DM (diabetes mellitus) type 2, uncontrolled, with ketoacidosis ([place]) 06/03/2013    Past Surgical History:  Procedure Laterality Date  . APPENDECTOMY    . CHOLECYSTECTOMY N/A 07/11/2013   Procedure: LAPAROSCOPIC CHOLECYSTECTOMY;  Surgeon:  Jamesetta So, MD;  Location: [facility];  Service: General;  Laterality: N/A;  . INCISION AND DRAINAGE ABSCESS / HEMATOMA OF BURSA / KNEE / THIGH    . KNEE SURGERY Bilateral   . VIDEO BRONCHOSCOPY Bilateral 02/09/2018   Procedure: VIDEO BRONCHOSCOPY WITHOUT FLUORO;  Surgeon: Brand Males, MD;  Location: [facility];  Service: Cardiopulmonary;   Laterality: Bilateral;       Family History  Problem Relation Age of Onset  . Diabetes Mother   . Diabetes Maternal Grandmother   . CAD Maternal Grandmother        heart attack in her 70s  . Pancreatitis Neg Hx   . Colon cancer Neg Hx     Social History   Tobacco Use  . Smoking status: Current Some Day Smoker    Packs/day: 0.50    Years: 10.00    Pack years: 5.00    Types: Cigarettes  . Smokeless tobacco: Never Used  Vaping Use  . Vaping Use: Never used  Substance Use Topics  . Alcohol use: No  . Drug use: No    Home Medications Prior to Admission medications   Medication Sig Start Date End Date Taking? Authorizing Provider  acetaminophen (TYLENOL) 325 MG tablet Take 2 tablets (650 mg total) by mouth every 6 (six) hours as needed for mild pain (or Fever >/= 101). 12/28/19  Yes Emokpae, Courage, MD  albuterol (VENTOLIN HFA) 108 (90 Base) MCG/ACT inhaler Inhale 2 puffs into the lungs every 6 (six) hours as needed. 12/28/19  Yes Roxan Hockey, MD  aspirin EC 81 MG EC tablet Take 1 tablet (81 mg total) by mouth daily with breakfast. 12/28/19  Yes Emokpae, Courage, MD  chlorthalidone (HYGROTON) 25 MG tablet Take 25 mg by mouth daily. 09/11/20  Yes [provider]  Cholecalciferol (VITAMIN D3) 125 MCG (5000 UT) CAPS Take 1 capsule (5,000 Units total) by mouth daily. 03/08/20  Yes Nida, Marella Chimes, MD  divalproex (DEPAKOTE) 500 MG DR tablet Take 2 tablets (1,000 mg total) by mouth at bedtime. 12/28/19  Yes Emokpae, Courage, MD  fenofibrate (TRICOR) 145 MG tablet Take 145 mg by mouth daily. 09/11/20  Yes [provider]  furosemide (LASIX) 40 MG tablet Take 40 mg by mouth daily as needed for edema. 08/23/20  Yes [provider]  insulin aspart (NOVOLOG FLEXPEN) 100 UNIT/ML FlexPen Inject 10-16 Units into the skin 3 (three) times daily before meals. Patient taking differently: Inject 10-20 Units into the skin 3 (three) times daily before meals. 09/11/20   Yes Nida, Marella Chimes, MD  insulin glargine (LANTUS) 100 UNIT/ML injection Inject 0.35 mLs (35 Units total) into the skin daily. 09/11/20  Yes Cassandria Anger, MD  Multiple Vitamin (MULTIVITAMIN) tablet Take 1 tablet by mouth daily.   Yes [provider]  omeprazole (PRILOSEC) 20 MG capsule Take 20 mg by mouth at bedtime.   Yes [provider]  risperidone (RISPERDAL) 4 MG tablet Take 4 mg by mouth at bedtime.    Yes [provider]  simvastatin (ZOCOR) 20 MG tablet Take 20 mg by mouth daily.   Yes [provider]  solifenacin (VESICARE) 5 MG tablet Take 5 mg by mouth at bedtime.    Yes [provider]  SYMBICORT 160-4.5 MCG/ACT inhaler Inhale 2 puffs into the lungs 2 (two) times daily. 12/28/19  Yes Roxan Hockey, MD  Accu-Chek FastClix Lancets MISC USE TO TEST BLOOD SUGAR FOUR TIMES DAILY. 07/16/20   Cassandria Anger, MD  BD VEO INSULIN SYRINGE U/F 31G X 15/64" 1 ML MISC USE AS DIRECTED PER PROVIDER TO INJECT INSULIN DAILY. 09/07/20   Cassandria Anger, MD  glucose blood (ONETOUCH VERIO) test strip 1 each by Other route 2 (two) times daily. And lancets 2/day 12/28/19   Roxan Hockey, MD  Insulin Syringe-Needle U-100 (SURE COMFORT INSULIN SYRINGE) 30G X 1/2" 1 ML MISC USE AS DIRECTED PER PROVIDER TO INJECT INSULIN DAILY. 12/28/19   Roxan Hockey, MD  lisinopril (ZESTRIL) 20 MG tablet Take 1 tablet by mouth daily. Patient not taking: Reported on 11/01/2020 10/22/20   [provider]  SURE COMFORT PEN NEEDLES 31G X 8 MM MISC USE TO TEST 4 TIMES DAILY. 03/14/20   Cassandria Anger, MD    Allergies    Patient has no known allergies.  Review of Systems   Review of Systems  Constitutional: Negative for chills and fever.  HENT: Negative for congestion and sore throat.   Eyes: Negative.   Respiratory: Negative for cough, chest tightness and shortness of breath.   Cardiovascular: Negative for chest pain, palpitations and  leg swelling.  Gastrointestinal: Negative for abdominal pain and nausea.  Genitourinary: Negative.   Musculoskeletal: Negative for arthralgias, joint swelling and neck pain.  Skin: Negative.  Negative for rash and wound.  Neurological: Positive for weakness and light-headedness. Negative for dizziness, numbness and headaches.  Psychiatric/Behavioral: Negative.     Physical Exam Updated Vital Signs BP (!) 131/94 Comment: pt states back is hurting  Pulse 95   Temp 98.4 F (36.9 C) (Oral)   Resp 19   Ht 6\' 1"  (1.854 m)   Wt 79.4 kg   SpO2 94%   BMI 23.09 kg/m   Physical Exam Vitals and nursing note reviewed.  Constitutional:      General: He is not in acute distress.    Appearance: He is well-developed.  HENT:     Head: Normocephalic and atraumatic.     Mouth/Throat:     Mouth: Mucous membranes are moist.  Eyes:     Conjunctiva/sclera: Conjunctivae normal.  Cardiovascular:     Rate and Rhythm: Normal rate and regular rhythm.     Heart sounds: Normal heart sounds.  Pulmonary:     Effort: Pulmonary effort is normal.     Breath sounds: Rhonchi present. No wheezing.  Abdominal:     General: Bowel sounds are normal.     Palpations: Abdomen is soft.     Tenderness: There is no abdominal tenderness.  Musculoskeletal:        General: Normal range of motion.     Cervical back: Normal range of motion.     Right lower leg: No edema.     Left lower leg: No edema.  Skin:    General: Skin is warm and dry.  Neurological:     General: No focal deficit present.     Mental Status: He is alert and oriented to person, place, and time.     Cranial Nerves: No cranial nerve deficit.     Sensory: No sensory deficit.     Motor: No weakness.     Coordination: Coordination normal.  Psychiatric:        Mood and Affect: Mood normal.     ED Results / Procedures / Treatments   Labs (all labs ordered are listed, but only abnormal results are displayed) Labs Reviewed  CBC WITH  DIFFERENTIAL/PLATELET - Abnormal; Notable for the following components:      Result Value  RBC 3.49 (*)    Hemoglobin 9.6 (*)    HCT 30.9 (*)    All other components within normal limits  COMPREHENSIVE METABOLIC PANEL - Abnormal; Notable for the following components:   Glucose, Bld 123 (*)    Albumin 3.1 (*)    All other components within normal limits  URINALYSIS, ROUTINE W REFLEX MICROSCOPIC - Abnormal; Notable for the following components:   Protein, ur 30 (*)    All other components within normal limits  VALPROIC ACID LEVEL - Abnormal; Notable for the following components:   Valproic Acid Lvl 15 (*)    All other components within normal limits  CBG MONITORING, ED - Abnormal; Notable for the following components:   Glucose-Capillary 122 (*)    All other components within normal limits  D-DIMER, QUANTITATIVE  LACTIC ACID, PLASMA    EKG EKG Interpretation  Date/Time:  Thursday November 01 2020 13:11:36 EDT Ventricular Rate:  95 PR Interval:  129 QRS Duration: 83 QT Interval:  351 QTC Calculation: 442 R Axis:   75 Text Interpretation: Sinus rhythm Nonspecific T abnormalities, lateral leads Confirmed by Nanda Quinton [phone]) on 11/01/2020 2:07:28 PM   Radiology DG Chest Port 1 View  Result Date: 11/01/2020 CLINICAL DATA:  Syncope.  History of interstitial lung disease EXAM: PORTABLE CHEST 1 VIEW COMPARISON:  09/13/2020 FINDINGS: The heart size and mediastinal contours are within normal limits. Extensive bilateral reticulonodular interstitial opacities compatible with diffuse interstitial lung disease. No evidence of superimposed airspace opacity. No pleural effusion or pneumothorax. The visualized skeletal structures are unremarkable. IMPRESSION: Chronic interstitial lung disease. No evidence of a superimposed acute cardiopulmonary process. Electronically Signed   By: Davina Poke D.O.   On: 11/01/2020 13:45    Procedures Procedures   Medications Ordered in ED Medications   sodium chloride 0.9 % bolus 1,000 mL (1,000 mLs Intravenous New Bag/Given 11/01/20 1543)  sodium chloride 0.9 % bolus 1,000 mL (1,000 mLs Intravenous New Bag/Given 11/01/20 1544)  albuterol (VENTOLIN HFA) 108 (90 Base) MCG/ACT inhaler 2 puff (2 puffs Inhalation Given 11/01/20 1745)    ED Course  I have reviewed the triage vital signs and the nursing notes.  Pertinent labs & imaging results that were available during my care of the patient were reviewed by me and considered in my medical decision making (see chart for details).    MDM Rules/Calculators/A&P                          Patient with 2 episodes of syncope this morning persistent generalized weakness, and tachypnea. He denies sob at this time but feels generally weak.  Respiratory rate consistently elevated, mother now at bedside states he has episodes of near syncope when in the shower. D dimer negative, PE unlikely. Concern for worsening interstitial lung disease. Episodes do not sound like seizure episodes.  Pt asked for albuterol mdi which was given, no change in lung exam, rhonchorous, no wheezing appreciated.    Pt with multiple episodes of syncope who would benefit from admission, also consideration of pulmonary consult while here given persistent tachypnea.    (scheduled to see Dr. Halford Chessman next week to establish care).   Pt discussed with Dr. Josephine Cables who accepts pt for admission.    Final Clinical Impression(s) / ED Diagnoses Final diagnoses:  Syncope, unspecified syncope type  Tachypnea  Iron deficiency anemia, unspecified iron deficiency anemia type    Rx / DC Orders ED Discharge Orders  None       Landis Martins 11/01/20 2107    Margette Fast, MD 11/02/20 0800

## 2020-11-02 ENCOUNTER — Inpatient Hospital Stay (HOSPITAL_COMMUNITY)
Admit: 2020-11-02 | Discharge: 2020-11-02 | Disposition: A | Discharge status: Home / Self Care | Payer: Medicaid Other | Attending: Internal Medicine | Admitting: Internal Medicine

## 2020-11-02 ENCOUNTER — Observation Stay (HOSPITAL_COMMUNITY): Payer: Medicaid Other

## 2020-11-02 DIAGNOSIS — E1165 Type 2 diabetes mellitus with hyperglycemia: Secondary | ICD-10-CM | POA: Diagnosis present

## 2020-11-02 DIAGNOSIS — G901 Familial dysautonomia [Riley-Day]: Secondary | ICD-10-CM | POA: Diagnosis present

## 2020-11-02 DIAGNOSIS — Z833 Family history of diabetes mellitus: Secondary | ICD-10-CM | POA: Diagnosis not present

## 2020-11-02 DIAGNOSIS — I951 Orthostatic hypotension: Secondary | ICD-10-CM | POA: Diagnosis not present

## 2020-11-02 DIAGNOSIS — F17210 Nicotine dependence, cigarettes, uncomplicated: Secondary | ICD-10-CM | POA: Diagnosis present

## 2020-11-02 DIAGNOSIS — J8489 Other specified interstitial pulmonary diseases: Secondary | ICD-10-CM | POA: Diagnosis present

## 2020-11-02 DIAGNOSIS — D509 Iron deficiency anemia, unspecified: Secondary | ICD-10-CM

## 2020-11-02 DIAGNOSIS — F319 Bipolar disorder, unspecified: Secondary | ICD-10-CM

## 2020-11-02 DIAGNOSIS — K219 Gastro-esophageal reflux disease without esophagitis: Secondary | ICD-10-CM

## 2020-11-02 DIAGNOSIS — R0682 Tachypnea, not elsewhere classified: Secondary | ICD-10-CM | POA: Diagnosis present

## 2020-11-02 DIAGNOSIS — E782 Mixed hyperlipidemia: Secondary | ICD-10-CM | POA: Diagnosis present

## 2020-11-02 DIAGNOSIS — Z20822 Contact with and (suspected) exposure to covid-19: Secondary | ICD-10-CM | POA: Diagnosis present

## 2020-11-02 DIAGNOSIS — Z7951 Long term (current) use of inhaled steroids: Secondary | ICD-10-CM | POA: Diagnosis not present

## 2020-11-02 DIAGNOSIS — Z8701 Personal history of pneumonia (recurrent): Secondary | ICD-10-CM | POA: Diagnosis not present

## 2020-11-02 DIAGNOSIS — E1169 Type 2 diabetes mellitus with other specified complication: Secondary | ICD-10-CM | POA: Diagnosis present

## 2020-11-02 DIAGNOSIS — Z794 Long term (current) use of insulin: Secondary | ICD-10-CM | POA: Diagnosis not present

## 2020-11-02 DIAGNOSIS — J9611 Chronic respiratory failure with hypoxia: Secondary | ICD-10-CM | POA: Diagnosis present

## 2020-11-02 DIAGNOSIS — I10 Essential (primary) hypertension: Secondary | ICD-10-CM | POA: Diagnosis present

## 2020-11-02 DIAGNOSIS — Z79899 Other long term (current) drug therapy: Secondary | ICD-10-CM | POA: Diagnosis not present

## 2020-11-02 DIAGNOSIS — Z8249 Family history of ischemic heart disease and other diseases of the circulatory system: Secondary | ICD-10-CM | POA: Diagnosis not present

## 2020-11-02 DIAGNOSIS — R55 Syncope and collapse: Secondary | ICD-10-CM

## 2020-11-02 DIAGNOSIS — J849 Interstitial pulmonary disease, unspecified: Secondary | ICD-10-CM | POA: Diagnosis not present

## 2020-11-02 DIAGNOSIS — Z7982 Long term (current) use of aspirin: Secondary | ICD-10-CM | POA: Diagnosis not present

## 2020-11-02 DIAGNOSIS — F17200 Nicotine dependence, unspecified, uncomplicated: Secondary | ICD-10-CM | POA: Diagnosis not present

## 2020-11-02 LAB — COMPREHENSIVE METABOLIC PANEL
ALT: 10 U/L (ref 0–44)
AST: 13 U/L — ABNORMAL LOW (ref 15–41)
Albumin: 2.8 g/dL — ABNORMAL LOW (ref 3.5–5.0)
Alkaline Phosphatase: 70 U/L (ref 38–126)
Anion gap: 4 — ABNORMAL LOW (ref 5–15)
BUN: 13 mg/dL (ref 6–20)
CO2: 26 mmol/L (ref 22–32)
Calcium: 8.9 mg/dL (ref 8.9–10.3)
Chloride: 104 mmol/L (ref 98–111)
Creatinine, Ser: 0.86 mg/dL (ref 0.61–1.24)
GFR, Estimated: >60 mL/min (ref >60)
Glucose, Bld: 152 mg/dL — ABNORMAL HIGH (ref 70–99)
Potassium: 3.8 mmol/L (ref 3.5–5.1)
Sodium: 134 mmol/L — ABNORMAL LOW (ref 135–145)
Total Bilirubin: 0.3 mg/dL (ref 0.3–1.2)
Total Protein: 7.8 g/dL (ref 6.5–8.1)

## 2020-11-02 LAB — ECHOCARDIOGRAM COMPLETE
AR max vel: 2.8 cm2
AV Area VTI: 2.99 cm2
AV Area mean vel: 2.86 cm2
AV Mean grad: 3.3 mmHg
AV Peak grad: 7.4 mmHg
Ao pk vel: 1.36 m/s
Area-P 1/2: 4.63 cm2
Calc EF: 47.8 %
Height: 73 in
S' Lateral: 2.8 cm
Single Plane A2C EF: 53.9 %
Single Plane A4C EF: 39.2 %
Weight: 2800 oz

## 2020-11-02 LAB — PHOSPHORUS: Phosphorus: 3.2 mg/dL (ref 2.5–4.6)

## 2020-11-02 LAB — CBC
HCT: 32.2 % — ABNORMAL LOW (ref 39.0–52.0)
Hemoglobin: 9.8 g/dL — ABNORMAL LOW (ref 13.0–17.0)
MCH: 27.3 pg (ref 26.0–34.0)
MCHC: 30.4 g/dL (ref 30.0–36.0)
MCV: 89.7 fL (ref 80.0–100.0)
Platelets: 266 10*3/uL (ref 150–400)
RBC: 3.59 MIL/uL — ABNORMAL LOW (ref 4.22–5.81)
RDW: 14.6 % (ref 11.5–15.5)
WBC: 8.6 10*3/uL (ref 4.0–10.5)
nRBC: 0 % (ref 0.0–0.2)

## 2020-11-02 LAB — HEMOGLOBIN A1C
Hgb A1c MFr Bld: 8.6 % — ABNORMAL HIGH (ref 4.8–5.6)
Mean Plasma Glucose: 200 mg/dL

## 2020-11-02 LAB — FOLATE: Folate: 15.6 ng/mL (ref >5.9)

## 2020-11-02 LAB — GLUCOSE, CAPILLARY
Glucose-Capillary: 150 mg/dL — ABNORMAL HIGH (ref 70–99)
Glucose-Capillary: 184 mg/dL — ABNORMAL HIGH (ref 70–99)
Glucose-Capillary: 234 mg/dL — ABNORMAL HIGH (ref 70–99)
Glucose-Capillary: 244 mg/dL — ABNORMAL HIGH (ref 70–99)
Glucose-Capillary: 242 mg/dL — ABNORMAL HIGH (ref 70–99)

## 2020-11-02 LAB — PROTIME-INR
INR: 1.1 (ref 0.8–1.2)
Prothrombin Time: 13.7 seconds (ref 11.4–15.2)

## 2020-11-02 LAB — RAPID URINE DRUG SCREEN, HOSP PERFORMED
Amphetamines: NOT DETECTED
Barbiturates: NOT DETECTED
Benzodiazepines: NOT DETECTED
Cocaine: NOT DETECTED
Opiates: NOT DETECTED
Tetrahydrocannabinol: POSITIVE — AB

## 2020-11-02 LAB — MAGNESIUM: Magnesium: 1.8 mg/dL (ref 1.7–2.4)

## 2020-11-02 LAB — IRON AND TIBC
Iron: 127 ug/dL (ref 45–182)
Saturation Ratios: 37 % (ref 17.9–39.5)
TIBC: 341 ug/dL (ref 250–450)
UIBC: 214 ug/dL

## 2020-11-02 LAB — FERRITIN: Ferritin: 356 ng/mL — ABNORMAL HIGH (ref 24–336)

## 2020-11-02 LAB — T4, FREE: Free T4: 0.98 ng/dL (ref 0.61–1.12)

## 2020-11-02 LAB — APTT: aPTT: 34 seconds (ref 24–36)

## 2020-11-02 LAB — VITAMIN B12: Vitamin B-12: 409 pg/mL (ref 180–914)

## 2020-11-02 LAB — TROPONIN I (HIGH SENSITIVITY)
Troponin I (High Sensitivity): 6 ng/L (ref <18)
Troponin I (High Sensitivity): 5 ng/L (ref <18)

## 2020-11-02 LAB — TSH: TSH: 1.303 u[IU]/mL (ref 0.350–4.500)

## 2020-11-02 MED ORDER — RISPERIDONE 1 MG PO TABS
4.0000 mg | ORAL_TABLET | Freq: Every day | ORAL | Status: DC
  Administered 2020-11-02: 4 mg via ORAL
  Filled 2020-11-02 (×2): qty 4

## 2020-11-02 MED ORDER — INSULIN GLARGINE 100 UNIT/ML ~~LOC~~ SOLN
10.0000 [IU] | Freq: Every day | SUBCUTANEOUS | Status: DC
  Administered 2020-11-02: 10 [IU] via SUBCUTANEOUS
  Filled 2020-11-02 (×3): qty 0.1

## 2020-11-02 MED ORDER — SIMVASTATIN 20 MG PO TABS
20.0000 mg | ORAL_TABLET | Freq: Every day | ORAL | Status: DC
  Administered 2020-11-02 – 2020-11-03 (×2): 20 mg via ORAL
  Filled 2020-11-02 (×2): qty 1

## 2020-11-02 MED ORDER — LACTATED RINGERS IV SOLN: INTRAVENOUS | Status: DC

## 2020-11-02 MED ORDER — ENOXAPARIN SODIUM 40 MG/0.4ML IJ SOSY
40.0000 mg | PREFILLED_SYRINGE | INTRAMUSCULAR | Status: DC
  Administered 2020-11-02 – 2020-11-03 (×2): 40 mg via SUBCUTANEOUS
  Filled 2020-11-02 (×2): qty 0.4

## 2020-11-02 MED ORDER — INSULIN ASPART 100 UNIT/ML IJ SOLN
0.0000 [IU] | Freq: Three times a day (TID) | INTRAMUSCULAR | Status: DC
  Administered 2020-11-02: 2 [IU] via SUBCUTANEOUS
  Administered 2020-11-02 (×2): 3 [IU] via SUBCUTANEOUS
  Administered 2020-11-03: 5 [IU] via SUBCUTANEOUS
  Administered 2020-11-03: 2 [IU] via SUBCUTANEOUS

## 2020-11-02 MED ORDER — LACTATED RINGERS IV BOLUS
1000.0000 mL | Freq: Once | INTRAVENOUS | Status: AC
  Administered 2020-11-02: 1000 mL via INTRAVENOUS

## 2020-11-02 MED ORDER — ASPIRIN EC 81 MG PO TBEC
81.0000 mg | DELAYED_RELEASE_TABLET | Freq: Every day | ORAL | Status: DC
  Administered 2020-11-02 – 2020-11-03 (×2): 81 mg via ORAL
  Filled 2020-11-02 (×2): qty 1

## 2020-11-02 MED ORDER — INSULIN ASPART 100 UNIT/ML IJ SOLN
0.0000 [IU] | Freq: Every day | INTRAMUSCULAR | Status: DC
  Administered 2020-11-02: 2 [IU] via SUBCUTANEOUS

## 2020-11-02 MED ORDER — MOMETASONE FURO-FORMOTEROL FUM 200-5 MCG/ACT IN AERO
2.0000 | INHALATION_SPRAY | Freq: Two times a day (BID) | RESPIRATORY_TRACT | Status: DC
  Administered 2020-11-02 – 2020-11-03 (×3): 2 via RESPIRATORY_TRACT
  Filled 2020-11-02: qty 8.8

## 2020-11-02 MED ORDER — ENSURE ENLIVE PO LIQD
237.0000 mL | Freq: Two times a day (BID) | ORAL | Status: DC
  Administered 2020-11-02 (×2): 237 mL via ORAL

## 2020-11-02 MED ORDER — NICOTINE 21 MG/24HR TD PT24
21.0000 mg | MEDICATED_PATCH | Freq: Every day | TRANSDERMAL | Status: DC
  Administered 2020-11-02 – 2020-11-03 (×2): 21 mg via TRANSDERMAL
  Filled 2020-11-02 (×3): qty 1

## 2020-11-02 MED ORDER — ACETAMINOPHEN 325 MG PO TABS
650.0000 mg | ORAL_TABLET | Freq: Four times a day (QID) | ORAL | Status: DC | PRN
  Administered 2020-11-02 (×2): 650 mg via ORAL
  Filled 2020-11-02 (×2): qty 2

## 2020-11-02 MED ORDER — FENOFIBRATE 160 MG PO TABS
160.0000 mg | ORAL_TABLET | Freq: Every day | ORAL | Status: DC
  Administered 2020-11-02 – 2020-11-03 (×2): 160 mg via ORAL
  Filled 2020-11-02 (×2): qty 1

## 2020-11-02 MED ORDER — IPRATROPIUM-ALBUTEROL 0.5-2.5 (3) MG/3ML IN SOLN: 3.0000 mL | RESPIRATORY_TRACT | Status: DC | PRN

## 2020-11-02 MED ORDER — ONDANSETRON HCL 4 MG/2ML IJ SOLN: 4.0000 mg | Freq: Four times a day (QID) | INTRAMUSCULAR | Status: DC | PRN

## 2020-11-02 MED ORDER — PANTOPRAZOLE SODIUM 40 MG PO TBEC
40.0000 mg | DELAYED_RELEASE_TABLET | Freq: Every day | ORAL | Status: DC
  Administered 2020-11-02 – 2020-11-03 (×2): 40 mg via ORAL
  Filled 2020-11-02 (×2): qty 1

## 2020-11-02 MED ORDER — NICOTINE 21 MG/24HR TD PT24: 21.0000 mg | MEDICATED_PATCH | Freq: Every day | TRANSDERMAL | Status: DC

## 2020-11-02 NOTE — Progress Notes (Addendum)
Initial Nutrition Assessment  DOCUMENTATION CODES:      INTERVENTION:  Ensure Enlive po BID, each supplement provides 350 kcal and 20 grams of protein    NUTRITION DIAGNOSIS:   Inadequate oral intake related to catabolic illness (interstitial lung disease) as evidenced by estimated needs,percent weight loss (unplanned loss of 12% the past 5 months).   GOAL:  Patient will meet greater than or equal to 90% of their needs   MONITOR:  PO intake,Supplement acceptance,Labs,Weight trends  REASON FOR ASSESSMENT:   Malnutrition Screening Tool    ASSESSMENT: Patient is a 45 yo male with DM2, interstitial lung disease, HLD, HTN. Presents with syncope.  Patient is followed by Dr Dorris Fetch. He reports consistently eats 3 meals daily. His lunch tray is here 100% consumed.  Patient expecting to be discharged in the morning. Offered patient review of diabetic diet. He politely refused.   Medications reviewed and include: Ensure, Insulin, Protonix, Nicoderm.   Patient weight is down ~10 kg (12%) since December. He denies changes in appetite or eating pattern.   Labs: A1C- 8.4% Jan. 2022  BMP Latest Ref Rng & Units 11/02/2020 11/01/2020 09/27/2020  Glucose 70 - 99 mg/dL 152(H) 123(H) 190(H)  BUN 6 - 20 mg/dL 13 20 23(H)  Creatinine 0.61 - 1.24 mg/dL 0.86 1.08 1.16  BUN/Creat Ratio 9 - 20 - - -  Sodium 135 - 145 mmol/L 134(L) 137 133(L)  Potassium 3.5 - 5.1 mmol/L 3.8 3.7 3.8  Chloride 98 - 111 mmol/L 104 102 98  CO2 22 - 32 mmol/L 26 29 26   Calcium 8.9 - 10.3 mg/dL 8.9 9.3 9.8     NUTRITION - FOCUSED PHYSICAL EXAM: Nutrition-Focused physical exam completed. Findings are mild buccal fat depletion, mild temporal muscle depletion, and no edema.     Diet Order:   Diet Order            Diet heart healthy/carb modified Room service appropriate? Yes; Fluid consistency: Thin  Diet effective now                 EDUCATION NEEDS:  Education needs have been addressed  Skin:  Skin Assessment:  Reviewed RN Assessment  Last BM:  6/2  Height:   Ht Readings from Last 1 Encounters:  11/01/20 6\' 1"  (1.854 m)    Weight:   Wt Readings from Last 1 Encounters:  11/01/20 79.4 kg    Ideal Body Weight:   84 kg  BMI:  Body mass index is 23.09 kg/m.  Estimated Nutritional Needs:   Kcal:  3295-1884  Protein:  88-95 gr  Fluid:  >2 liters daily   Colman Cater MS,RD,CSG,LDN Contact: [email or web address]

## 2020-11-02 NOTE — Progress Notes (Signed)
EEG complete - results pending 

## 2020-11-02 NOTE — Progress Notes (Signed)
PROGRESS NOTE  [name] WIO:[id number] DOB: [birth year] DOA: 11/01/2020 PCP: Jani Gravel, MD  Brief History:  45 year old man with history of hypertension, hyperlipidemia, diabetes mellitus type 2, chronic respiratory failure on 2 L, interstitial lung disease, tobacco abuse, bipolar disorder presenting with 2 episodes of syncope.  The patient states that he was sitting on the porch with his family when he began feeling dizzy and the next thing he remembered was his family trying to wake him up.  He stated that he was alert and oriented and lucid when he woke up and was able to recognize and communicate with his family normally.  He did not bite his tongue nor was there any bowel or bladder incontinence.  Apparently, his family noted that the patient had some shaking type movements and was slumped over and drooling.  The patient states that over the last 6 to 12 months, he has had some orthostasis which has been followed by his PCP.  He states that he has been feeling dizzy and near syncopal when he stands up.  However, the patient denied any prodromal type symptoms prior to his events prior to this admission.  He denied any recent medication changes.  He denies any fevers, chills, chest pain, shortness breath, cough, hemoptysis, headache, neck pain, visual disturbance.  There is been no nausea, vomiting, diarrhea, abdominal pain, dysuria, hematuria.  He continues to smoke about 6 cigarettes a day he has 20-pack-year history of tobacco. In the emergency department, the patient was afebrile hemodynamically stable with oxygen saturation 95% on room air.  He was placed on 2 L with saturation up to 98%.  BMP showed sodium 137, serum creatinine 1.08, potassium 3.7, WBC 10.5, hemoglobin 9.6, platelets 269,000.  Assessment/Plan: Syncope and collapse -Likely secondary to vasovagal response/orthostasis -Orthostatic vital signs positive again a.m. 11/02/2020 -Bolus IV normal saline restart IV  fluids -If continues to be orthostatic after fluid resuscitation, plan on starting midodrine -Echocardiogram -EEG  Chronic respiratory failure with hypoxia -On 2 L nasal cannula at nighttime -He may need oxygen 24/7 -He has a pulmonary appointment 11/07/2020  Uncontrolled diabetes mellitus type 2 with hyperglycemia -06/13/2020 hemoglobin A1c 8.4 -NovoLog sliding scale -Reduced dose Lantus  Tobacco abuse -Tobacco cessation discussed  Essential hypertension -Patient states that he no longer takes any antihypertensive medications although lisinopril and HCTZ are listed on his medication list  Bipolar disorder -Continue Depakote  Normocytic anemia -Check iron studies -B12 -Folate  HLD -continue statin     Status is: Observation  The patient will require care spanning > 2 midnights and should be moved to inpatient because: IV treatments appropriate due to intensity of illness or inability to take PO  Dispo: The patient is from: Home              Anticipated d/c is to: Home              Patient currently is not medically stable to d/c.   Difficult to place patient No   Plan discharge 11/03/20        Family Communication:  no Family at bedside  Consultants:  none  Code Status:  FULL   DVT Prophylaxis: [place] Lovenox   Procedures: As Listed in Progress Note Above  Antibiotics: None       Subjective: Patient denies fevers, chills, headache, chest pain, dyspnea, nausea, vomiting, diarrhea, abdominal pain, dysuria, hematuria, hematochezia, and melena.   Objective: Vitals:   11/02/20  0130 11/02/20 0216 11/02/20 0529 11/02/20 0704  BP: 139/84 130/89 135/83   Pulse: 69 86 67   Resp: (!) 21 16 17    Temp:  98.1 F (36.7 C) 97.8 F (36.6 C)   TempSrc:  Oral Oral   SpO2: 97% 100% 98% 98%  Weight:      Height:        Intake/Output Summary (Last 24 hours) at 11/02/2020 3716 Last data filed at 11/02/2020 0500 Gross per 24 hour  Intake --  Output 1275 ml   Net -1275 ml   Weight change:  Exam:   General:  Pt is alert, follows commands appropriately, not in acute distress  HEENT: No icterus, No thrush, No neck mass, [place]/AT  Cardiovascular: RRR, S1/S2, no rubs, no gallops  Respiratory: bilateral rales. No wheeze  Abdomen: Soft/+BS, non tender, non distended, no guarding  Extremities: No edema, No lymphangitis, No petechiae, No rashes, no synovitis   Data Reviewed: I have personally reviewed following labs and imaging studies Basic Metabolic Panel: Recent Labs  Lab 11/01/20 1347 11/02/20 0246  NA 137 134*  K 3.7 3.8  CL 102 104  CO2 29 26  GLUCOSE 123* 152*  BUN 20 13  CREATININE 1.08 0.86  CALCIUM 9.3 8.9  MG  --  1.8  PHOS  --  3.2   Liver Function Tests: Recent Labs  Lab 11/01/20 1347 11/02/20 0246  AST 15 13*  ALT 11 10  ALKPHOS 72 70  BILITOT 0.5 0.3  PROT 8.1 7.8  ALBUMIN 3.1* 2.8*   No results for input(s): LIPASE, AMYLASE in the last 168 hours. No results for input(s): AMMONIA in the last 168 hours. Coagulation Profile: Recent Labs  Lab 11/02/20 0246  INR 1.1   CBC: Recent Labs  Lab 11/01/20 1347 11/02/20 0246  WBC 10.5 8.6  NEUTROABS 7.7  --   HGB 9.6* 9.8*  HCT 30.9* 32.2*  MCV 88.5 89.7  PLT 269 266   Cardiac Enzymes: No results for input(s): CKTOTAL, CKMB, CKMBINDEX, TROPONINI in the last 168 hours. BNP: Invalid input(s): POCBNP CBG: Recent Labs  Lab 11/01/20 1357 11/02/20 0724  GLUCAP 122* 184*   HbA1C: No results for input(s): HGBA1C in the last 72 hours. Urine analysis:    Component Value Date/Time   COLORURINE YELLOW 11/01/2020 1625   APPEARANCEUR CLEAR 11/01/2020 1625   LABSPEC 1.018 11/01/2020 1625   PHURINE 7.0 11/01/2020 1625   GLUCOSEU NEGATIVE 11/01/2020 1625   HGBUR NEGATIVE 11/01/2020 1625   BILIRUBINUR NEGATIVE 11/01/2020 1625   KETONESUR NEGATIVE 11/01/2020 1625   PROTEINUR 30 (A) 11/01/2020 1625   UROBILINOGEN 0.2 05/28/2013 2226   NITRITE NEGATIVE  11/01/2020 1625   LEUKOCYTESUR NEGATIVE 11/01/2020 1625   Sepsis Labs: @LABRCNTIP (procalcitonin:4,lacticidven:4) ) Recent Results (from the past 240 hour(s))  Resp Panel by RT-PCR (Flu A&B, Covid) Nasopharyngeal Swab     Status: None   Collection Time: 11/01/20 10:49 PM   Specimen: Nasopharyngeal Swab; Nasopharyngeal(NP) swabs in vial transport medium  Result Value Ref Range Status   SARS Coronavirus 2 by RT PCR NEGATIVE NEGATIVE Final    Comment: (NOTE) SARS-CoV-2 target nucleic acids are NOT DETECTED.  The SARS-CoV-2 RNA is generally detectable in upper respiratory specimens during the acute phase of infection. The lowest concentration of SARS-CoV-2 viral copies this assay can detect is 138 copies/mL. A negative result does not preclude SARS-Cov-2 infection and should not be used as the sole basis for treatment or other patient management decisions. A negative result may  occur with  improper specimen collection/handling, submission of specimen other than nasopharyngeal swab, presence of viral mutation(s) within the areas targeted by this assay, and inadequate number of viral copies(<138 copies/mL). A negative result must be combined with clinical observations, patient history, and epidemiological information. The expected result is Negative.  Fact Sheet for Patients:  EntrepreneurPulse.com.au  Fact Sheet for Healthcare Providers:  IncredibleEmployment.be  This test is no t yet approved or cleared by the [place] FDA and  has been authorized for detection and/or diagnosis of SARS-CoV-2 by FDA under an Emergency Use Authorization (EUA). This EUA will remain  in effect (meaning this test can be used) for the duration of the COVID-19 declaration under Section 564(b)(1) of the Act, 21 U.S.C.section 360bbb-3(b)(1), unless the authorization is terminated  or revoked sooner.       Influenza A by PCR NEGATIVE NEGATIVE Final   Influenza B  by PCR NEGATIVE NEGATIVE Final    Comment: (NOTE) The Xpert Xpress SARS-CoV-2/FLU/RSV plus assay is intended as an aid in the diagnosis of influenza from Nasopharyngeal swab specimens and should not be used as a sole basis for treatment. Nasal washings and aspirates are unacceptable for Xpert Xpress SARS-CoV-2/FLU/RSV testing.  Fact Sheet for Patients: EntrepreneurPulse.com.au  Fact Sheet for Healthcare Providers: IncredibleEmployment.be  This test is not yet approved or cleared by the [place] FDA and has been authorized for detection and/or diagnosis of SARS-CoV-2 by FDA under an Emergency Use Authorization (EUA). This EUA will remain in effect (meaning this test can be used) for the duration of the COVID-19 declaration under Section 564(b)(1) of the Act, 21 U.S.C. section 360bbb-3(b)(1), unless the authorization is terminated or revoked.  Performed at [facility], [address] [place], [place] [address]      Scheduled Meds: . aspirin EC  81 mg Oral Q breakfast  . enoxaparin (LOVENOX) injection  40 mg Subcutaneous Q24H  . feeding supplement  237 mL Oral BID BM  . fenofibrate  160 mg Oral Daily  . insulin aspart  0-5 Units Subcutaneous QHS  . insulin aspart  0-9 Units Subcutaneous TID WC  . insulin glargine  10 Units Subcutaneous QHS  . mometasone-formoterol  2 puff Inhalation BID  . nicotine  21 mg Transdermal Daily  . pantoprazole  40 mg Oral Daily  . risperidone  4 mg Oral QHS  . simvastatin  20 mg Oral Daily   Continuous Infusions:  Procedures/Studies: DG Chest Port 1 View  Result Date: 11/01/2020 CLINICAL DATA:  Syncope.  History of interstitial lung disease EXAM: PORTABLE CHEST 1 VIEW COMPARISON:  09/13/2020 FINDINGS: The heart size and mediastinal contours are within normal limits. Extensive bilateral reticulonodular interstitial opacities compatible with diffuse interstitial lung disease. No evidence of superimposed  airspace opacity. No pleural effusion or pneumothorax. The visualized skeletal structures are unremarkable. IMPRESSION: Chronic interstitial lung disease. No evidence of a superimposed acute cardiopulmonary process. Electronically Signed   By: Davina Poke D.O.   On: 11/01/2020 13:45    Orson Eva, DO  [facility]  If 7PM-7AM, please contact night-coverage [email or web address] Password [email or web address] 11/02/2020, 8:23 AM   LOS: 0 days

## 2020-11-02 NOTE — Progress Notes (Signed)
  Echocardiogram 2D Echocardiogram has been performed.  [name] 11/02/2020, 11:14 AM

## 2020-11-02 NOTE — H&P (Addendum)
History and Physical  [name] ERD:[id number] DOB: [birth year] DOA: 11/01/2020  Referring physician: Evalee Jefferson, PA-C PCP: Jani Gravel, MD  Patient coming from: Home  Chief Complaint: Syncope  HPI: [name] is a 45 y.o. male with medical history significant for hypertension, hyperlipidemia, T2DM, chronic respiratory failure on supplemental oxygen via [place] at 2 LPM at home, ILD, GERD, bipolar disorder and tobacco abuse who presents to the emergency department due to possible syncopal episode.  Patient states that he was visiting his cousins and while at home talking with his cousin in law he had 2 witnessed episodes while sitting down whereby he was reported to have passed out, had some shaking movements and the episodes only last 3 and 5 minutes minutes with head falling backwards with eyes open, slumped, drooled without postictal state, there was no biting of tongue, bowel or urinary incontinence.  Patient reported that he has been having increasing lightheadedness with positional changes which was similar to symptoms with which he presented to the ED in April of this year he denies chest pain, shortness of breath, fever, chills, abdominal pain  ED Course:  In the emergency department, he was intermittently tachypneic, he was hypoxic on arrival to the ED with an O2 sat of 83% per ED PA and patient was provided with supplemental oxygen via [place] at 2 LPM with subsequent good saturations.  Work-up in the ED showed normocytic anemia, and normal CBC with mild hyperglycemia.  Albumin 3.1.  Urinalysis was unimpressive for UTI, lactic acid was 1.2.  D-dimer 0.36.  Influenza A, B, SARS coronavirus 2 was negative. Chest x-ray showed Chronic interstitial lung disease. No evidence of a superimposed acute cardiopulmonary process. Treatment was provided and IV hydration was given.  Hospitalist was asked to admit patient for further evaluation and management.  Review of Systems: Constitutional:  Negative for chills and fever.  HENT: Negative for ear pain and sore throat.   Eyes: Negative for pain and visual disturbance.  Respiratory: Negative for cough, chest tightness and shortness of breath.   Cardiovascular: Negative for chest pain and palpitations.  Gastrointestinal: Negative for abdominal pain and vomiting.  Endocrine: Negative for polyphagia and polyuria.  Genitourinary: Negative for decreased urine volume, dysuria, enuresis Musculoskeletal: Negative for arthralgias and back pain.  Skin: Negative for color change and rash.  Allergic/Immunologic: Negative for immunocompromised state.  Neurological: Positive for lightheadedness and weakness.  Negative for tremors, syncope, speech difficulty Hematological: Does not bruise/bleed easily.  All other systems reviewed and are negative     Past Medical History:  Diagnosis Date  . Bipolar disorder ([place])   . Chronic respiratory failure ([place])   . Erectile dysfunction   . Essential hypertension   . GERD (gastroesophageal reflux disease)   . History of pneumonia   . Hyperlipidemia   . Interstitial lung disease (HCC)    Pulmonary eosinophilia  . Obesity   . Pulmonary eosinophilia ([place])   . Sinus tachycardia   . Tobacco abuse   . Type 2 diabetes mellitus ([place])    Past Surgical History:  Procedure Laterality Date  . APPENDECTOMY    . CHOLECYSTECTOMY N/A 07/11/2013   Procedure: LAPAROSCOPIC CHOLECYSTECTOMY;  Surgeon: Jamesetta So, MD;  Location: [facility];  Service: General;  Laterality: N/A;  . INCISION AND DRAINAGE ABSCESS / HEMATOMA OF BURSA / KNEE / THIGH    . KNEE SURGERY Bilateral   . VIDEO BRONCHOSCOPY Bilateral 02/09/2018   Procedure: VIDEO BRONCHOSCOPY WITHOUT FLUORO;  Surgeon: Brand Males,  MD;  Location: [facility];  Service: Cardiopulmonary;  Laterality: Bilateral;    Social History:  reports that he has been smoking cigarettes. He has a 5.00 pack-year smoking history. He has never used smokeless tobacco. He  reports that he does not drink alcohol and does not use drugs.   No Known Allergies  Family History  Problem Relation Age of Onset  . Diabetes Mother   . Diabetes Maternal Grandmother   . CAD Maternal Grandmother        heart attack in her 47s  . Pancreatitis Neg Hx   . Colon cancer Neg Hx      Prior to Admission medications   Medication Sig Start Date End Date Taking? Authorizing Provider  acetaminophen (TYLENOL) 325 MG tablet Take 2 tablets (650 mg total) by mouth every 6 (six) hours as needed for mild pain (or Fever >/= 101). 12/28/19  Yes Emokpae, Courage, MD  albuterol (VENTOLIN HFA) 108 (90 Base) MCG/ACT inhaler Inhale 2 puffs into the lungs every 6 (six) hours as needed. 12/28/19  Yes Roxan Hockey, MD  aspirin EC 81 MG EC tablet Take 1 tablet (81 mg total) by mouth daily with breakfast. 12/28/19  Yes Emokpae, Courage, MD  chlorthalidone (HYGROTON) 25 MG tablet Take 25 mg by mouth daily. 09/11/20  Yes [provider]  Cholecalciferol (VITAMIN D3) 125 MCG (5000 UT) CAPS Take 1 capsule (5,000 Units total) by mouth daily. 03/08/20  Yes Nida, Marella Chimes, MD  divalproex (DEPAKOTE) 500 MG DR tablet Take 2 tablets (1,000 mg total) by mouth at bedtime. 12/28/19  Yes Emokpae, Courage, MD  fenofibrate (TRICOR) 145 MG tablet Take 145 mg by mouth daily. 09/11/20  Yes [provider]  furosemide (LASIX) 40 MG tablet Take 40 mg by mouth daily as needed for edema. 08/23/20  Yes [provider]  insulin aspart (NOVOLOG FLEXPEN) 100 UNIT/ML FlexPen Inject 10-16 Units into the skin 3 (three) times daily before meals. Patient taking differently: Inject 10-20 Units into the skin 3 (three) times daily before meals. 09/11/20  Yes Nida, Marella Chimes, MD  insulin glargine (LANTUS) 100 UNIT/ML injection Inject 0.35 mLs (35 Units total) into the skin daily. 09/11/20  Yes Cassandria Anger, MD  Multiple Vitamin (MULTIVITAMIN) tablet Take 1 tablet by mouth daily.   Yes  [provider]  omeprazole (PRILOSEC) 20 MG capsule Take 20 mg by mouth at bedtime.   Yes [provider]  risperidone (RISPERDAL) 4 MG tablet Take 4 mg by mouth at bedtime.    Yes [provider]  simvastatin (ZOCOR) 20 MG tablet Take 20 mg by mouth daily.   Yes [provider]  solifenacin (VESICARE) 5 MG tablet Take 5 mg by mouth at bedtime.    Yes [provider]  SYMBICORT 160-4.5 MCG/ACT inhaler Inhale 2 puffs into the lungs 2 (two) times daily. 12/28/19  Yes Roxan Hockey, MD  Accu-Chek FastClix Lancets MISC USE TO TEST BLOOD SUGAR FOUR TIMES DAILY. 07/16/20   Cassandria Anger, MD  BD VEO INSULIN SYRINGE U/F 31G X 15/64" 1 ML MISC USE AS DIRECTED PER PROVIDER TO INJECT INSULIN DAILY. 09/07/20   Cassandria Anger, MD  glucose blood (ONETOUCH VERIO) test strip 1 each by Other route 2 (two) times daily. And lancets 2/day 12/28/19   Roxan Hockey, MD  Insulin Syringe-Needle U-100 (SURE COMFORT INSULIN SYRINGE) 30G X 1/2" 1 ML MISC USE AS DIRECTED PER PROVIDER TO INJECT INSULIN DAILY. 12/28/19   Roxan Hockey, MD  lisinopril (ZESTRIL) 20 MG tablet Take 1 tablet by mouth daily. Patient not taking: Reported on 11/01/2020 10/22/20   [provider]  SURE COMFORT PEN NEEDLES 31G X 8 MM MISC USE TO TEST 4 TIMES DAILY. 03/14/20   Cassandria Anger, MD    Physical Exam: BP 130/89 (BP Location: Left Arm)   Pulse 86   Temp 98.1 F (36.7 C) (Oral)   Resp 16   Ht 6\' 1"  (1.854 m)   Wt 79.4 kg   SpO2 100%   BMI 23.09 kg/m   . General: 45 y.o. year-old male well developed well nourished in no acute distress.  Alert and oriented x3. Marland Kitchen HEENT: NCAT, EOMI . Neck: Supple, trachea medial . Cardiovascular: Regular rate and rhythm with no rubs or gallops.  No thyromegaly or JVD noted.  No lower extremity edema. 2/4 pulses in all 4 extremities. Marland Kitchen Respiratory: Diffuse rhonchi on auscultation with no  rales.  . Abdomen: Soft, nontender  nondistended with normal bowel sounds x4 quadrants. . Muskuloskeletal: No cyanosis, clubbing or edema noted bilaterally . Neuro: CN II-XII intact, strength 5/5 x 4, sensation, reflexes intact . Skin: No ulcerative lesions noted or rashes . Psychiatry: Judgement and insight appear normal. Mood is appropriate for condition and setting          Labs on Admission:  Basic Metabolic Panel: Recent Labs  Lab 11/01/20 1347  NA 137  K 3.7  CL 102  CO2 29  GLUCOSE 123*  BUN 20  CREATININE 1.08  CALCIUM 9.3   Liver Function Tests: Recent Labs  Lab 11/01/20 1347  AST 15  ALT 11  ALKPHOS 72  BILITOT 0.5  PROT 8.1  ALBUMIN 3.1*   No results for input(s): LIPASE, AMYLASE in the last 168 hours. No results for input(s): AMMONIA in the last 168 hours. CBC: Recent Labs  Lab 11/01/20 1347  WBC 10.5  NEUTROABS 7.7  HGB 9.6*  HCT 30.9*  MCV 88.5  PLT 269   Cardiac Enzymes: No results for input(s): CKTOTAL, CKMB, CKMBINDEX, TROPONINI in the last 168 hours.  BNP (last 3 results) Recent Labs    09/13/20 1331  BNP 37.0    ProBNP (last 3 results) No results for input(s): PROBNP in the last 8760 hours.  CBG: Recent Labs  Lab 11/01/20 1357  GLUCAP 122*    Radiological Exams on Admission: DG Chest Port 1 View  Result Date: 11/01/2020 CLINICAL DATA:  Syncope.  History of interstitial lung disease EXAM: PORTABLE CHEST 1 VIEW COMPARISON:  09/13/2020 FINDINGS: The heart size and mediastinal contours are within normal limits. Extensive bilateral reticulonodular interstitial opacities compatible with diffuse interstitial lung disease. No evidence of superimposed airspace opacity. No pleural effusion or pneumothorax. The visualized skeletal structures are unremarkable. IMPRESSION: Chronic interstitial lung disease. No evidence of a superimposed acute cardiopulmonary process. Electronically Signed   By: Davina Poke D.O.   On: 11/01/2020 13:45    EKG: I independently viewed the  EKG done and my findings are as followed: Normal sinus rhythm at rate of 95 bpm Normal sinus rhythm at a rate of 95 bpm Assessment/Plan Present on Admission: . Syncope . Chronic respiratory failure with hypoxia ([place]) . Current smoker . Essential hypertension, benign . Mixed hyperlipidemia . ILD (interstitial lung disease) (HCC)  Principal Problem:   Syncope Active Problems:   Type 2 diabetes mellitus with hyperlipidemia (HCC)   ILD (interstitial lung disease) ([place])   Chronic respiratory failure with hypoxia (HCC)   Essential hypertension,  benign   Mixed hyperlipidemia   Current smoker   GERD (gastroesophageal reflux disease)   Iron deficiency anemia  Acute syncopal episode Continue telemetry and watch for arrhythmias Troponins will be checked EKG personally reviewed showed normal sinus rhythm at a rate of 95 bpm Echocardiogram will be done in the morning to rule out significant aortic stenosis or other outflow obstruction, and also to evaluate EF and to rule out segmental/Regional wall motion abnormalities. Carotid artery Dopplers will be done in the morning to rule out hemodynamically significant stenosis  Reported history of iron deficiency anemia States that he recently had iron infusion due to iron deficiency anemia Continue to monitor CBC with morning labs  T2DM Continue ISS and hypoglycemic protocol Continue Lantus 10 units nightly  GERD Continue Protonix  Chronic respiratory failure with hypoxia possibly due to ILD Continue DuoNebs as needed Continue Dulera Continue supplemental oxygen via [place] per home regimen  Essential hypertension Home lisinopril discontinued by provider per med rec  Mixed hyperlipidemia Continue Zocor and Fenofibrate  Tobacco abuse Pt. Counseled on tobacco abuse cessation Continue nicotine patch  Bipolar disorder Continue risperidone per home regimen  DVT prophylaxis: Lovenox  Code Status: Full code  Family Communication: None  at bedside  Disposition Plan:  Patient is from:                        home Anticipated DC to:                    home Anticipated DC date:               1 day Anticipated DC barriers:           Patient requires inpatient work-up for syncope   Consults called: None  Admission status: Observation  Bernadette Hoit MD [facility]  11/02/2020, 2:36 AM

## 2020-11-03 DIAGNOSIS — F17200 Nicotine dependence, unspecified, uncomplicated: Secondary | ICD-10-CM | POA: Diagnosis not present

## 2020-11-03 DIAGNOSIS — J849 Interstitial pulmonary disease, unspecified: Secondary | ICD-10-CM | POA: Diagnosis not present

## 2020-11-03 DIAGNOSIS — R55 Syncope and collapse: Secondary | ICD-10-CM

## 2020-11-03 DIAGNOSIS — E1165 Type 2 diabetes mellitus with hyperglycemia: Secondary | ICD-10-CM

## 2020-11-03 DIAGNOSIS — J9611 Chronic respiratory failure with hypoxia: Secondary | ICD-10-CM | POA: Diagnosis not present

## 2020-11-03 DIAGNOSIS — I10 Essential (primary) hypertension: Secondary | ICD-10-CM | POA: Diagnosis not present

## 2020-11-03 LAB — BASIC METABOLIC PANEL
Anion gap: 5 (ref 5–15)
BUN: 10 mg/dL (ref 6–20)
CO2: 30 mmol/L (ref 22–32)
Calcium: 9.4 mg/dL (ref 8.9–10.3)
Chloride: 102 mmol/L (ref 98–111)
Creatinine, Ser: 0.85 mg/dL (ref 0.61–1.24)
GFR, Estimated: >60 mL/min (ref >60)
Glucose, Bld: 154 mg/dL — ABNORMAL HIGH (ref 70–99)
Potassium: 3.8 mmol/L (ref 3.5–5.1)
Sodium: 137 mmol/L (ref 135–145)

## 2020-11-03 LAB — CBC
HCT: 32.1 % — ABNORMAL LOW (ref 39.0–52.0)
Hemoglobin: 9.9 g/dL — ABNORMAL LOW (ref 13.0–17.0)
MCH: 27 pg (ref 26.0–34.0)
MCHC: 30.8 g/dL (ref 30.0–36.0)
MCV: 87.7 fL (ref 80.0–100.0)
Platelets: 262 10*3/uL (ref 150–400)
RBC: 3.66 MIL/uL — ABNORMAL LOW (ref 4.22–5.81)
RDW: 14.6 % (ref 11.5–15.5)
WBC: 6.4 10*3/uL (ref 4.0–10.5)
nRBC: 0 % (ref 0.0–0.2)

## 2020-11-03 LAB — GLUCOSE, CAPILLARY
Glucose-Capillary: 155 mg/dL — ABNORMAL HIGH (ref 70–99)
Glucose-Capillary: 267 mg/dL — ABNORMAL HIGH (ref 70–99)

## 2020-11-03 MED ORDER — MIDODRINE HCL 5 MG PO TABS
5.0000 mg | ORAL_TABLET | Freq: Three times a day (TID) | ORAL | Status: DC
  Administered 2020-11-03: 5 mg via ORAL
  Filled 2020-11-03: qty 1

## 2020-11-03 MED ORDER — MIDODRINE HCL 5 MG PO TABS: 5.0000 mg | ORAL_TABLET | Freq: Three times a day (TID) | ORAL | 1 refills | Status: AC

## 2020-11-03 NOTE — Discharge Summary (Signed)
Physician Discharge Summary  [name] LNL:[id number] DOB: [birth year] DOA: 11/01/2020  PCP: Jani Gravel, MD  Admit date: 11/01/2020 Discharge date: 11/03/2020  Admitted From: Home Disposition:  Home  Recommendations for Outpatient Follow-up:  1. Follow up with PCP in 1-2 weeks 2. Please obtain BMP/CBC in one week    Discharge Condition: Stable CODE STATUS: FULL Diet recommendation: Heart Healthy / Carb Modified    Brief/Interim Summary: 45 year old man with history of hypertension, hyperlipidemia, diabetes mellitus type 2, chronic respiratory failure on 2 L, interstitial lung disease, tobacco abuse, bipolar disorder presenting with 2 episodes of syncope.  The patient states that he was sitting on the porch with his family when he began feeling dizzy and the next thing he remembered was his family trying to wake him up.  He stated that he was alert and oriented and lucid when he woke up and was able to recognize and communicate with his family normally.  He did not bite his tongue nor was there any bowel or bladder incontinence.  Apparently, his family noted that the patient had some shaking type movements and was slumped over and drooling.  The patient states that over the last 6 to 12 months, he has had some orthostasis which has been followed by his PCP.  He states that he has been feeling dizzy and near syncopal when he stands up.  However, the patient denied any prodromal type symptoms prior to his events prior to this admission.  He denied any recent medication changes.  He denies any fevers, chills, chest pain, shortness breath, cough, hemoptysis, headache, neck pain, visual disturbance.  There is been no nausea, vomiting, diarrhea, abdominal pain, dysuria, hematuria.  He continues to smoke about 6 cigarettes a day he has 20-pack-year history of tobacco. In the emergency department, the patient was afebrile hemodynamically stable with oxygen saturation 95% on room air.  He was  placed on 2 L with saturation up to 98%.  BMP showed sodium 137, serum creatinine 1.08, potassium 3.7, WBC 10.5, hemoglobin 9.6, platelets 269,000.  Discharge Diagnoses:  Syncope and collapse -Likely secondary to vasovagal response/orthostasis -Orthostatic vital signs positive again a.m. 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 Dorris Fetch after d/c  Tobacco abuse/THC abuse -cessation discussed  Essential hypertension -Patient states that he no longer takes any antihypertensive medications although lisinopril and HCTZ are listed on his medication list  Bipolar disorder -Continue Depakote  Normocytic anemia -Check iron studies--iron sat 37, ferritin 356 -B12--409 -Folate--15.6  HLD -continue statin  Unintentional Weight Loss -most likely due to poorly controlled DM2 -would need outpatient work up including but not limited to endoscopy, CT chest, abd, pelvis, PSA, etc if continues to lose weight once DM2 is better controlled--this was discussed with patient -TSH 1.303 -HIV--neg   Discharge Instructions   Allergies as of 11/03/2020   No Known Allergies     Medication List    STOP taking these medications   lisinopril 20 MG tablet Commonly known as: ZESTRIL     TAKE these medications   Accu-Chek FastClix Lancets  Misc USE TO TEST BLOOD SUGAR FOUR TIMES DAILY.   acetaminophen 325 MG  tablet Commonly known as: TYLENOL Take 2 tablets (650 mg total) by mouth every 6 (six) hours as needed for mild pain (or Fever >/= 101).   albuterol 108 (90 Base) MCG/ACT inhaler Commonly known as: VENTOLIN HFA Inhale 2 puffs into the lungs every 6 (six) hours as needed.   aspirin 81 MG EC tablet Take 1 tablet (81 mg total) by mouth daily with breakfast.   chlorthalidone 25 MG tablet Commonly known as: HYGROTON Take 25 mg by mouth daily.   divalproex 500 MG DR tablet Commonly known as: DEPAKOTE Take 2 tablets (1,000 mg total) by mouth at bedtime.   fenofibrate 145 MG tablet Commonly known as: TRICOR Take 145 mg by mouth daily.   furosemide 40 MG tablet Commonly known as: LASIX Take 40 mg by mouth daily as needed for edema.   insulin glargine 100 UNIT/ML injection Commonly known as: LANTUS Inject 0.35 mLs (35 Units total) into the skin daily.   midodrine 5 MG tablet Commonly known as: PROAMATINE Take 1 tablet (5 mg total) by mouth 3 (three) times daily with meals.   multivitamin tablet Take 1 tablet by mouth daily.   NovoLOG FlexPen 100 UNIT/ML FlexPen Generic drug: insulin aspart Inject 10-16 Units into the skin 3 (three) times daily before meals. What changed: how much to take   omeprazole 20 MG capsule Commonly known as: PRILOSEC Take 20 mg by mouth at bedtime.   OneTouch Verio test strip Generic drug: glucose blood 1 each by Other route 2 (two) times daily. And lancets 2/day   risperidone 4 MG tablet Commonly known as: RISPERDAL Take 4 mg by mouth at bedtime.   simvastatin 20 MG tablet Commonly known as: ZOCOR Take 20 mg by mouth daily.   solifenacin 5 MG tablet Commonly known as: VESICARE Take 5 mg by mouth at bedtime.   Sure Comfort Insulin Syringe 30G X 1/2" 1 ML Misc Generic drug: Insulin Syringe-Needle U-100 USE AS DIRECTED PER PROVIDER TO INJECT INSULIN DAILY.   BD Veo Insulin Syringe U/F 31G X 15/64" 1 ML Misc Generic drug: Insulin  Syringe-Needle U-100 USE AS DIRECTED PER PROVIDER TO INJECT INSULIN DAILY.   Sure Comfort Pen Needles 31G X 8 MM Misc Generic drug: Insulin Pen Needle USE TO TEST 4 TIMES DAILY.   Symbicort 160-4.5 MCG/ACT inhaler Generic drug: budesonide-formoterol Inhale 2 puffs into the lungs 2 (two) times daily.   Vitamin D3 125 MCG (5000 UT) Caps Take 1 capsule (5,000 Units total) by mouth daily.       No Known Allergies  Consultations:  none   Procedures/Studies: DG Chest Port 1 View  Result Date: 11/01/2020 CLINICAL DATA:  Syncope.  History of interstitial lung disease EXAM: PORTABLE CHEST 1 VIEW COMPARISON:  09/13/2020 FINDINGS: The heart size and mediastinal contours are within normal limits. Extensive bilateral reticulonodular interstitial opacities compatible with diffuse interstitial lung disease. No evidence of superimposed airspace opacity. No pleural effusion or pneumothorax. The visualized skeletal structures are unremarkable. IMPRESSION: Chronic interstitial lung disease. No evidence of a superimposed acute cardiopulmonary process. Electronically Signed   By: Davina Poke D.O.   On: 11/01/2020 13:45   EEG adult  Result Date: 11/03/2020 Lora Havens, MD     11/03/2020 10:13 AM Patient Name: [name] MRN: [id number] Epilepsy Attending: Lora Havens Referring Physician/Provider: Dr Shanon Brow [place] Date: 11/02/2020 Duration: 23.02 mins Patient history: 45 yo M with syncope.  EEG to evaluate for seizure. Level of alertness: Awake AEDs during EEG study: None Technical aspects: This EEG study was done with scalp electrodes positioned according to the 10-20 International system of electrode placement. Electrical activity was acquired at a sampling rate of 500Hz  and reviewed with a high frequency filter of 70Hz  and a low frequency filter of 1Hz . EEG data were recorded continuously and digitally stored. Description: The posterior dominant rhythm consists of 8 Hz activity of moderate  voltage (25-35 uV) seen predominantly in posterior head regions, symmetric and reactive to eye opening and eye closing.   Physiologic photic driving was not seen during photic stimulation.  Hyperventilation was not performed.   IMPRESSION: This study is within normal limits. No seizures or epileptiform discharges were seen throughout the recording. Lora Havens   ECHOCARDIOGRAM COMPLETE  Result Date: 11/02/2020    ECHOCARDIOGRAM REPORT   Patient Name:   GIOMAR GUSLER Date of Exam: 11/02/2020 Medical Rec #:  [id number]           Height:       73.0 in Accession #:    [id number]          Weight:       175.0 lb Date of Birth:  07-Feb-1976           BSA:          2.033 m Patient Age:    8 years            BP:           135/83 mmHg Patient Gender: M                   HR:           69 bpm. Exam Location:  [facility] Procedure: 2D Echo, 3D Echo, Cardiac Doppler, Color Doppler and Strain Analysis Indications:    R55 Syncope  History:        Patient has prior history of Echocardiogram examinations, most                 recent 12/27/2019. Abnormal ECG, Arrythmias:Atrial Fibrillation,                 Signs/Symptoms:Syncope; Risk Factors:Diabetes, Dyslipidemia and                 Current Smoker.  Sonographer:    Roseanna Rainbow RDCS (AE) Referring Phys: [phone] Kanyon Bunn  Sonographer Comments: Global longitudinal strain was attempted. IMPRESSIONS  1. Left ventricular ejection fraction, by estimation, is 50 to 55%. The left ventricle has normal function. The left ventricle has no regional wall motion abnormalities. There is moderate left ventricular hypertrophy. Left ventricular diastolic parameters were normal. The average left ventricular global longitudinal strain is -16.0 %. The global longitudinal strain is normal.  2. Right ventricular systolic function is normal. The right ventricular size is normal.  3. The mitral valve is normal in structure. Trivial mitral valve regurgitation. No evidence of mitral stenosis.  4. The  aortic valve is tricuspid. Aortic valve regurgitation is not visualized. No aortic stenosis is present.  5. The inferior vena cava is normal in size with greater than 50% respiratory variability, suggesting right atrial pressure of 3 mmHg. FINDINGS  Left Ventricle: Left ventricular ejection fraction, by estimation, is 50 to 55%. The left ventricle has normal function. The left ventricle has no regional wall motion abnormalities. The average left ventricular global longitudinal strain is -16.0 %. The global longitudinal strain is  normal. The left ventricular internal cavity size was normal in size. There is moderate left ventricular hypertrophy. Left ventricular diastolic parameters were normal. Right Ventricle: The right ventricular size is normal. No increase in right ventricular wall thickness. Right ventricular systolic function is normal. Left Atrium: Left atrial size was normal in size. Right Atrium: Right atrial size was normal in size. Pericardium: There is no evidence of pericardial effusion. Mitral Valve: The mitral valve is normal in structure. Trivial mitral valve regurgitation. No evidence of mitral valve stenosis. Tricuspid Valve: The tricuspid valve is normal in structure. Tricuspid valve regurgitation is trivial. No evidence of tricuspid stenosis. Aortic Valve: The aortic valve is tricuspid. Aortic valve regurgitation is not visualized. No aortic stenosis is present. Aortic valve mean gradient measures 3.3 mmHg. Aortic valve peak gradient measures 7.4 mmHg. Aortic valve area, by VTI measures 2.99 cm. Pulmonic Valve: The pulmonic valve was not well visualized. Pulmonic valve regurgitation is not visualized. No evidence of pulmonic stenosis. Aorta: The aortic root is normal in size and structure. Pulmonary Artery: Indeterminant PASP, inadequate TR jet. Venous: The inferior vena cava is normal in size with greater than 50% respiratory variability, suggesting right atrial pressure of 3 mmHg. IAS/Shunts:  No atrial level shunt detected by color flow Doppler.  LEFT VENTRICLE PLAX 2D LVIDd:         3.90 cm      Diastology LVIDs:         2.80 cm      LV e' medial:    7.72 cm/s LV PW:         1.40 cm      LV E/e' medial:  11.9 LV IVS:        1.40 cm      LV e' lateral:   10.10 cm/s LVOT diam:     2.30 cm      LV E/e' lateral: 9.1 LV SV:         89 LV SV Index:   44           2D Longitudinal Strain LVOT Area:     4.15 cm     2D Strain GLS Avg:     -16.0 %  LV Volumes (MOD) LV vol d, MOD A2C: 118.0 ml 3D Volume EF: LV vol d, MOD A4C: 92.5 ml  3D EF:        42 % LV vol s, MOD A2C: 54.4 ml  LV EDV:       192 ml LV vol s, MOD A4C: 56.2 ml  LV ESV:       111 ml LV SV MOD A2C:     63.6 ml  LV SV:        81 ml LV SV MOD A4C:     92.5 ml LV SV MOD BP:      50.5 ml RIGHT VENTRICLE RV S prime:     8.27 cm/s TAPSE (M-mode): 1.6 cm LEFT ATRIUM             Index       RIGHT ATRIUM           Index LA diam:        3.40 cm 1.67 cm/m  RA Area:     15.70 cm LA Vol (A2C):   57.6 ml 28.33 ml/m RA Volume:   40.80 ml  20.07 ml/m LA Vol (A4C):   55.9 ml 27.50 ml/m LA Biplane Vol: 57.0 ml 28.04 ml/m  AORTIC VALVE AV Area (  Vmax):    2.80 cm AV Area (Vmean):   2.86 cm AV Area (VTI):     2.99 cm AV Vmax:           135.75 cm/s AV Vmean:          83.877 cm/s AV VTI:            0.298 m AV Peak Grad:      7.4 mmHg AV Mean Grad:      3.3 mmHg LVOT Vmax:         91.40 cm/s LVOT Vmean:        57.800 cm/s LVOT VTI:          0.215 m LVOT/AV VTI ratio: 0.72  AORTA Ao Root diam: 3.20 cm Ao Asc diam:  3.30 cm MITRAL VALVE MV Area (PHT): 4.63 cm    SHUNTS MV Decel Time: 164 msec    Systemic VTI:  0.22 m MV E velocity: 91.70 cm/s  Systemic Diam: 2.30 cm Carlyle Dolly MD Electronically signed by Carlyle Dolly MD Signature Date/Time: 11/02/2020/11:27:43 AM    Final          Discharge Exam: Vitals:   11/03/20 0617 11/03/20 0725  BP: (!) 140/91   Pulse: 73   Resp: 16   Temp: 97.7 F (36.5 C)   SpO2: 100% 99%   Vitals:   11/02/20 1912  11/02/20 2035 11/03/20 0617 11/03/20 0725  BP:  128/88 (!) 140/91   Pulse:  80 73   Resp:  16 16   Temp:  98.2 F (36.8 C) 97.7 F (36.5 C)   TempSrc:  Oral Oral   SpO2: 98% 100% 100% 99%  Weight:      Height:        General: Pt is alert, awake, not in acute distress Cardiovascular: RRR, S1/S2 +, no rubs, no gallops Respiratory: CTA bilaterally, no wheezing, no rhonchi Abdominal: Soft, NT, ND, bowel sounds + Extremities: no edema, no cyanosis   The results of significant diagnostics from this hospitalization (including imaging, microbiology, ancillary and laboratory) are listed below for reference.    Significant Diagnostic Studies: DG Chest Port 1 View  Result Date: 11/01/2020 CLINICAL DATA:  Syncope.  History of interstitial lung disease EXAM: PORTABLE CHEST 1 VIEW COMPARISON:  09/13/2020 FINDINGS: The heart size and mediastinal contours are within normal limits. Extensive bilateral reticulonodular interstitial opacities compatible with diffuse interstitial lung disease. No evidence of superimposed airspace opacity. No pleural effusion or pneumothorax. The visualized skeletal structures are unremarkable. IMPRESSION: Chronic interstitial lung disease. No evidence of a superimposed acute cardiopulmonary process. Electronically Signed   By: Davina Poke D.O.   On: 11/01/2020 13:45   EEG adult  Result Date: 11/03/2020 Lora Havens, MD     11/03/2020 10:13 AM Patient Name: [name] MRN: [id number] Epilepsy Attending: Lora Havens Referring Physician/Provider: Dr Shanon Brow Janyce Ellinger Date: 11/02/2020 Duration: 23.02 mins Patient history: 45 yo M with syncope. EEG to evaluate for seizure. Level of alertness: Awake AEDs during EEG study: None Technical aspects: This EEG study was done with scalp electrodes positioned according to the 10-20 International system of electrode placement. Electrical activity was acquired at a sampling rate of 500Hz  and reviewed with a high frequency filter of  70Hz  and a low frequency filter of 1Hz . EEG data were recorded continuously and digitally stored. Description: The posterior dominant rhythm consists of 8 Hz activity of moderate voltage (25-35 uV) seen predominantly in posterior head regions, symmetric and reactive to  eye opening and eye closing.   Physiologic photic driving was not seen during photic stimulation.  Hyperventilation was not performed.   IMPRESSION: This study is within normal limits. No seizures or epileptiform discharges were seen throughout the recording. Lora Havens   ECHOCARDIOGRAM COMPLETE  Result Date: 11/02/2020    ECHOCARDIOGRAM REPORT   Patient Name:   [name] Date of Exam: 11/02/2020 Medical Rec #:  [id number]           Height:       73.0 in Accession #:    [id number]          Weight:       175.0 lb Date of Birth:  June 22, 1975           BSA:          2.033 m Patient Age:    19 years            BP:           135/83 mmHg Patient Gender: M                   HR:           69 bpm. Exam Location:  [facility] Procedure: 2D Echo, 3D Echo, Cardiac Doppler, Color Doppler and Strain Analysis Indications:    R55 Syncope  History:        Patient has prior history of Echocardiogram examinations, most                 recent 12/27/2019. Abnormal ECG, Arrythmias:Atrial Fibrillation,                 Signs/Symptoms:Syncope; Risk Factors:Diabetes, Dyslipidemia and                 Current Smoker.  Sonographer:    Roseanna Rainbow RDCS (AE) Referring Phys: [phone] Semir Brill  Sonographer Comments: Global longitudinal strain was attempted. IMPRESSIONS  1. Left ventricular ejection fraction, by estimation, is 50 to 55%. The left ventricle has normal function. The left ventricle has no regional wall motion abnormalities. There is moderate left ventricular hypertrophy. Left ventricular diastolic parameters were normal. The average left ventricular global longitudinal strain is -16.0 %. The global longitudinal strain is normal.  2. Right ventricular systolic  function is normal. The right ventricular size is normal.  3. The mitral valve is normal in structure. Trivial mitral valve regurgitation. No evidence of mitral stenosis.  4. The aortic valve is tricuspid. Aortic valve regurgitation is not visualized. No aortic stenosis is present.  5. The inferior vena cava is normal in size with greater than 50% respiratory variability, suggesting right atrial pressure of 3 mmHg. FINDINGS  Left Ventricle: Left ventricular ejection fraction, by estimation, is 50 to 55%. The left ventricle has normal function. The left ventricle has no regional wall motion abnormalities. The average left ventricular global longitudinal strain is -16.0 %. The global longitudinal strain is normal. The left ventricular internal cavity size was normal in size. There is moderate left ventricular hypertrophy. Left ventricular diastolic parameters were normal. Right Ventricle: The right ventricular size is normal. No increase in right ventricular wall thickness. Right ventricular systolic function is normal. Left Atrium: Left atrial size was normal in size. Right Atrium: Right atrial size was normal in size. Pericardium: There is no evidence of pericardial effusion. Mitral Valve: The mitral valve is normal in structure. Trivial mitral valve regurgitation. No evidence of mitral valve stenosis. Tricuspid Valve: The tricuspid valve  is normal in structure. Tricuspid valve regurgitation is trivial. No evidence of tricuspid stenosis. Aortic Valve: The aortic valve is tricuspid. Aortic valve regurgitation is not visualized. No aortic stenosis is present. Aortic valve mean gradient measures 3.3 mmHg. Aortic valve peak gradient measures 7.4 mmHg. Aortic valve area, by VTI measures 2.99 cm. Pulmonic Valve: The pulmonic valve was not well visualized. Pulmonic valve regurgitation is not visualized. No evidence of pulmonic stenosis. Aorta: The aortic root is normal in size and structure. Pulmonary Artery:  Indeterminant PASP, inadequate TR jet. Venous: The inferior vena cava is normal in size with greater than 50% respiratory variability, suggesting right atrial pressure of 3 mmHg. IAS/Shunts: No atrial level shunt detected by color flow Doppler.  LEFT VENTRICLE PLAX 2D LVIDd:         3.90 cm      Diastology LVIDs:         2.80 cm      LV e' medial:    7.72 cm/s LV PW:         1.40 cm      LV E/e' medial:  11.9 LV IVS:        1.40 cm      LV e' lateral:   10.10 cm/s LVOT diam:     2.30 cm      LV E/e' lateral: 9.1 LV SV:         89 LV SV Index:   44           2D Longitudinal Strain LVOT Area:     4.15 cm     2D Strain GLS Avg:     -16.0 %  LV Volumes (MOD) LV vol d, MOD A2C: 118.0 ml 3D Volume EF: LV vol d, MOD A4C: 92.5 ml  3D EF:        42 % LV vol s, MOD A2C: 54.4 ml  LV EDV:       192 ml LV vol s, MOD A4C: 56.2 ml  LV ESV:       111 ml LV SV MOD A2C:     63.6 ml  LV SV:        81 ml LV SV MOD A4C:     92.5 ml LV SV MOD BP:      50.5 ml RIGHT VENTRICLE RV S prime:     8.27 cm/s TAPSE (M-mode): 1.6 cm LEFT ATRIUM             Index       RIGHT ATRIUM           Index LA diam:        3.40 cm 1.67 cm/m  RA Area:     15.70 cm LA Vol (A2C):   57.6 ml 28.33 ml/m RA Volume:   40.80 ml  20.07 ml/m LA Vol (A4C):   55.9 ml 27.50 ml/m LA Biplane Vol: 57.0 ml 28.04 ml/m  AORTIC VALVE AV Area (Vmax):    2.80 cm AV Area (Vmean):   2.86 cm AV Area (VTI):     2.99 cm AV Vmax:           135.75 cm/s AV Vmean:          83.877 cm/s AV VTI:            0.298 m AV Peak Grad:      7.4 mmHg AV Mean Grad:      3.3 mmHg LVOT Vmax:  91.40 cm/s LVOT Vmean:        57.800 cm/s LVOT VTI:          0.215 m LVOT/AV VTI ratio: 0.72  AORTA Ao Root diam: 3.20 cm Ao Asc diam:  3.30 cm MITRAL VALVE MV Area (PHT): 4.63 cm    SHUNTS MV Decel Time: 164 msec    Systemic VTI:  0.22 m MV E velocity: 91.70 cm/s  Systemic Diam: 2.30 cm Carlyle Dolly MD Electronically signed by Carlyle Dolly MD Signature Date/Time: 11/02/2020/11:27:43 AM     Final      Microbiology: Recent Results (from the past 240 hour(s))  Resp Panel by RT-PCR (Flu A&B, Covid) Nasopharyngeal Swab     Status: None   Collection Time: 11/01/20 10:49 PM   Specimen: Nasopharyngeal Swab; Nasopharyngeal(NP) swabs in vial transport medium  Result Value Ref Range Status   SARS Coronavirus 2 by RT PCR NEGATIVE NEGATIVE Final    Comment: (NOTE) SARS-CoV-2 target nucleic acids are NOT DETECTED.  The SARS-CoV-2 RNA is generally detectable in upper respiratory specimens during the acute phase of infection. The lowest concentration of SARS-CoV-2 viral copies this assay can detect is 138 copies/mL. A negative result does not preclude SARS-Cov-2 infection and should not be used as the sole basis for treatment or other patient management decisions. A negative result may occur with  improper specimen collection/handling, submission of specimen other than nasopharyngeal swab, presence of viral mutation(s) within the areas targeted by this assay, and inadequate number of viral copies(<138 copies/mL). A negative result must be combined with clinical observations, patient history, and epidemiological information. The expected result is Negative.  Fact Sheet for Patients:  EntrepreneurPulse.com.au  Fact Sheet for Healthcare Providers:  IncredibleEmployment.be  This test is no t yet approved or cleared by the [place] FDA and  has been authorized for detection and/or diagnosis of SARS-CoV-2 by FDA under an Emergency Use Authorization (EUA). This EUA will remain  in effect (meaning this test can be used) for the duration of the COVID-19 declaration under Section 564(b)(1) of the Act, 21 U.S.C.section 360bbb-3(b)(1), unless the authorization is terminated  or revoked sooner.       Influenza A by PCR NEGATIVE NEGATIVE Final   Influenza B by PCR NEGATIVE NEGATIVE Final    Comment: (NOTE) The Xpert Xpress SARS-CoV-2/FLU/RSV plus  assay is intended as an aid in the diagnosis of influenza from Nasopharyngeal swab specimens and should not be used as a sole basis for treatment. Nasal washings and aspirates are unacceptable for Xpert Xpress SARS-CoV-2/FLU/RSV testing.  Fact Sheet for Patients: EntrepreneurPulse.com.au  Fact Sheet for Healthcare Providers: IncredibleEmployment.be  This test is not yet approved or cleared by the [place] FDA and has been authorized for detection and/or diagnosis of SARS-CoV-2 by FDA under an Emergency Use Authorization (EUA). This EUA will remain in effect (meaning this test can be used) for the duration of the COVID-19 declaration under Section 564(b)(1) of the Act, 21 U.S.C. section 360bbb-3(b)(1), unless the authorization is terminated or revoked.  Performed at [facility], [address], [place], [place] [address]      Labs: Basic Metabolic Panel: Recent Labs  Lab 11/01/20 1347 11/02/20 0246 11/03/20 0600  NA 137 134* 137  K 3.7 3.8 3.8  CL 102 104 102  CO2 29 26 30   GLUCOSE 123* 152* 154*  BUN 20 13 10   CREATININE 1.08 0.86 0.85  CALCIUM 9.3 8.9 9.4  MG  --  1.8  --   PHOS  --  3.2  --    Liver Function Tests: Recent Labs  Lab 11/01/20 1347 11/02/20 0246  AST 15 13*  ALT 11 10  ALKPHOS 72 70  BILITOT 0.5 0.3  PROT 8.1 7.8  ALBUMIN 3.1* 2.8*   No results for input(s): LIPASE, AMYLASE in the last 168 hours. No results for input(s): AMMONIA in the last 168 hours. CBC: Recent Labs  Lab 11/01/20 1347 11/02/20 0246 11/03/20 0600  WBC 10.5 8.6 6.4  NEUTROABS 7.7  --   --   HGB 9.6* 9.8* 9.9*  HCT 30.9* 32.2* 32.1*  MCV 88.5 89.7 87.7  PLT 269 266 262   Cardiac Enzymes: No results for input(s): CKTOTAL, CKMB, CKMBINDEX, TROPONINI in the last 168 hours. BNP: Invalid input(s): POCBNP CBG: Recent Labs  Lab 11/02/20 0724 11/02/20 1117 11/02/20 1559 11/02/20 2243 11/03/20 0808  GLUCAP 184* 234* 244*  242* 155*    Time coordinating discharge:  36 minutes  Signed:  Orson Eva, DO [facility] Pager: [phone] 11/03/2020, 10:54 AM

## 2020-11-03 NOTE — Plan of Care (Signed)

## 2020-11-03 NOTE — Procedures (Signed)
Patient Name: [name]  MRN: [id number]  Epilepsy Attending: Lora Havens  Referring Physician/Provider: Dr Shanon Brow Tat Date: 11/02/2020 Duration: 23.02 mins  Patient history: 45 yo M with syncope. EEG to evaluate for seizure.  Level of alertness: Awake  AEDs during EEG study: None  Technical aspects: This EEG study was done with scalp electrodes positioned according to the 10-20 International system of electrode placement. Electrical activity was acquired at a sampling rate of 500Hz  and reviewed with a high frequency filter of 70Hz  and a low frequency filter of 1Hz . EEG data were recorded continuously and digitally stored.   Description: The posterior dominant rhythm consists of 8 Hz activity of moderate voltage (25-35 uV) seen predominantly in posterior head regions, symmetric and reactive to eye opening and eye closing.   Physiologic photic driving was not seen during photic stimulation.  Hyperventilation was not performed.     IMPRESSION: This study is within normal limits. No seizures or epileptiform discharges were seen throughout the recording.    [name]

## 2020-11-06 MED ORDER — SODIUM CHLORIDE 0.9 % IV SOLN
510.0000 mg | INTRAVENOUS | Status: DC
  Filled 2020-11-06: qty 17

## 2020-11-07 ENCOUNTER — Encounter: Payer: Self-pay | Admitting: Pulmonary Disease

## 2020-11-07 ENCOUNTER — Other Ambulatory Visit: Payer: Self-pay

## 2020-11-07 ENCOUNTER — Telehealth: Payer: Self-pay | Admitting: Pulmonary Disease

## 2020-11-07 ENCOUNTER — Encounter (HOSPITAL_COMMUNITY)
Admission: RE | Admit: 2020-11-07 | Discharge: 2020-11-07 | Disposition: A | Discharge status: Home / Self Care | Payer: Medicaid Other | Source: Ambulatory Visit | Attending: Nephrology | Admitting: Nephrology

## 2020-11-07 ENCOUNTER — Telehealth: Payer: Self-pay

## 2020-11-07 ENCOUNTER — Ambulatory Visit: Payer: Medicaid Other | Admitting: Pulmonary Disease

## 2020-11-07 ENCOUNTER — Encounter (HOSPITAL_COMMUNITY): Payer: Self-pay

## 2020-11-07 DIAGNOSIS — Z79899 Other long term (current) drug therapy: Secondary | ICD-10-CM | POA: Diagnosis not present

## 2020-11-07 DIAGNOSIS — J849 Interstitial pulmonary disease, unspecified: Secondary | ICD-10-CM

## 2020-11-07 DIAGNOSIS — E119 Type 2 diabetes mellitus without complications: Secondary | ICD-10-CM | POA: Diagnosis present

## 2020-11-07 LAB — SEDIMENTATION RATE: Sed Rate: 89 mm/hr — ABNORMAL HIGH (ref 0–16)

## 2020-11-07 MED ORDER — SODIUM CHLORIDE 0.9 % IV SOLN
510.0000 mg | Freq: Once | INTRAVENOUS | Status: AC
  Administered 2020-11-07: 510 mg via INTRAVENOUS
  Filled 2020-11-07: qty 510

## 2020-11-07 MED ORDER — PREDNISONE 20 MG PO TABS: 20.0000 mg | ORAL_TABLET | Freq: Every day | ORAL | 1 refills | Status: DC

## 2020-11-07 MED ORDER — SODIUM CHLORIDE 0.9 % IV SOLN: Freq: Once | INTRAVENOUS | Status: AC

## 2020-11-07 NOTE — Telephone Encounter (Signed)
Order placed for oxygen tubing and supplies to [place] and patient called and updated that order was sent in. Patient expressed full understanding. Nothing further needed at this time.

## 2020-11-07 NOTE — Patient Instructions (Signed)
Prednisone 40 mg daily  Symbicort two puffs in the morning and two puffs in the evening  Albuterol two puffs every 6 hours as needed for cough, wheeze, chest congestion, or shortness of breath  Will arrange for high resolution CT chest and lab tests at [facility]  Will arrange for pulmonary function test in [place] office   Follow up in [place] office in 3 weeks

## 2020-11-07 NOTE — Telephone Encounter (Signed)
Okay to send order for oxygen tubing and supplies.

## 2020-11-07 NOTE — Telephone Encounter (Signed)
Patient returned phone call that he received from [facility] and writer confirmed name and birth date. Patient made aware of HRCT appointment for Thursday 11/22/20 at [facility] [place]. Patient made aware to come at 7:45am. Patient expressed full understanding and all questions answered. Nothing further needed at this time.

## 2020-11-07 NOTE — Telephone Encounter (Signed)
Spoke with patient who states he needs an order sent in to [facility] for O2 tubing/supplies. Patient states he has an appointment at the hospital at 1pm today and states it's ok to call and leave a detailed message if order sent in.   Dr Halford Chessman please advise.

## 2020-11-07 NOTE — Progress Notes (Signed)
[facility]  Chief Complaint  Patient presents with  . Follow-up    Discharged from [facility] 11/04/20 per patient. No respiratory complaints currently    Constitutional:  BP 118/70 (BP Location: Left Arm, Cuff Size: Normal)   Pulse (!) 114   Temp (!) 97 F (36.1 C) (Temporal)   Ht _0  (1.854 m)   Wt 178 lb 9.6 oz (81 kg)   SpO2 100% Comment: Room air  BMI 23.56 kg/m   Past Medical History:  Bipolar, ED, HTN, GERD, Pneumonia, HLD, DM type 2  Past Surgical History:  He  has a past surgical history that includes Appendectomy; Knee surgery (Bilateral); Cholecystectomy (N/A, 07/11/2013); Video bronchoscopy (Bilateral, 02/09/2018); and Incision and drainage abscess / hematoma of bursa / knee / thigh.  Brief Summary:  [name] is a 45 y.o. 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      Subjective:   He was seen by Dr. Chase Caller in 2019.  Had bronchoscopy that showed 23% eosinophils in BAL.  Serology from that time was negative.  BAL cultures negative also.  He was on prednisone then, but lost to follow up.  He was in hospital earlier this month for syncope and advised he needed pulmonary follow up.  He is from [place].  Used to work in [employer], but not working now.  Smokes 1 pack in a week.  Occasional smokes marijuana.  No animal/bird exposures.  Does not work outdoors.  His mother died from respiratory failure and needed a tracheostomy.  He has cough with thick white sputum.  Intermittently gets wheezing.  Gets winded when walking.  Uses 2 liters oxygen at night.  Uses symbicort twice per day and this helps.  Hasn't been using albuterol much.  Has lost about 40 lbs in past year.  No fever, sweats, or hemoptysis.  Occasional feels like food gets stuck and then gags.  No diarrhea or melena.  No joint swelling.  Occasionally gets skin boils on his arms and legs.  Physical Exam:   Appearance - well  kempt   ENMT - no sinus tenderness, no oral exudate, no LAN, Mallampati 3 airway, no stridor, prominent nasal turbinates, clear nasal congestion  Respiratory - coarse breath sounds b/l with b/l rhonchi and rales  CV - s1s2 regular rate and rhythm, no murmurs  Ext - no clubbing, no edema  Skin - no rashes  Psych - normal mood and affect   Pulmonary testing:   Bronchoscopy 02/09/18 >> acute inflammation, 165 WBC (63% N, 2% L, 12% M, 23% E)  IgE 02/16/18 >> 369  Chest Imaging:   CT chest 12/02/17 >> diffuse GGO with mild BTX, no honeycombing w/o change from 2014  Sleep Tests:    Cardiac Tests:   Echo 11/02/20 >> EF 50 to 55%  Social History:  He  reports that he has been smoking cigarettes. He has a 5.00 pack-year smoking history. He has never used smokeless tobacco. He reports that he does not drink alcohol and does not use drugs.  Family History:  His family history includes CAD in his maternal grandmother; Diabetes in his maternal grandmother and mother.     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 - will start him on prednisone 40 mg daily - continue symbicort and prn albuterol - will arrange for ANA, ANCA, ESR, ACE, RAST with IgE - will arrange for high resolution CT chest and  pulmonary function test - depending on results of above, might need additional lung tissue sampling  CKD 2. - followed by Dr. Ulice Bold with [facility]  DM type 2. - follow up with his PCP to adjust regimen while he is on prednisone  Tobacco abuse. - he is using nicotine patch  Recreational marijuana use. - explained the concern about smoke inhalation of any kind in relation to his lung disease  Time Spent Involved in Patient Care on Day of Examination:  47 minutes  Follow up:  Patient Instructions  Prednisone 40 mg daily  Symbicort two puffs in the morning and two puffs in the evening  Albuterol two puffs every 6  hours as needed for cough, wheeze, chest congestion, or shortness of breath  Will arrange for high resolution CT chest and lab tests at [facility]  Will arrange for pulmonary function test in [place] office   Follow up in [place] office in 3 weeks   Medication List:   Allergies as of 11/07/2020   No Known Allergies     Medication List       Accurate as of November 07, 2020  9:41 AM. If you have any questions, ask your nurse or doctor.        Accu-Chek FastClix Lancets Misc USE TO TEST BLOOD SUGAR FOUR TIMES DAILY.   acetaminophen 325 MG tablet Commonly known as: TYLENOL Take 2 tablets (650 mg total) by mouth every 6 (six) hours as needed for mild pain (or Fever >/= 101).   albuterol 108 (90 Base) MCG/ACT inhaler Commonly known as: VENTOLIN HFA Inhale 2 puffs into the lungs every 6 (six) hours as needed.   aspirin 81 MG EC tablet Take 1 tablet (81 mg total) by mouth daily with breakfast.   chlorthalidone 25 MG tablet Commonly known as: HYGROTON Take 25 mg by mouth daily.   divalproex 500 MG DR tablet Commonly known as: DEPAKOTE Take 2 tablets (1,000 mg total) by mouth at bedtime.   fenofibrate 145 MG tablet Commonly known as: TRICOR Take 145 mg by mouth daily.   furosemide 40 MG tablet Commonly known as: LASIX Take 40 mg by mouth daily as needed for edema.   insulin glargine 100 UNIT/ML injection Commonly known as: LANTUS Inject 0.35 mLs (35 Units total) into the skin daily.   midodrine 5 MG tablet Commonly known as: PROAMATINE Take 1 tablet (5 mg total) by mouth 3 (three) times daily with meals.   multivitamin tablet Take 1 tablet by mouth daily.   NovoLOG FlexPen 100 UNIT/ML FlexPen Generic drug: insulin aspart Inject 10-16 Units into the skin 3 (three) times daily before meals. What changed: how much to take   omeprazole 20 MG capsule Commonly known as: PRILOSEC Take 20 mg by mouth at bedtime.   OneTouch Verio test strip Generic drug:  glucose blood 1 each by Other route 2 (two) times daily. And lancets 2/day   predniSONE 20 MG tablet Commonly known as: DELTASONE Take 1 tablet (20 mg total) by mouth daily with breakfast. Started by: Chesley Mires, MD   risperidone 4 MG tablet Commonly known as: RISPERDAL Take 4 mg by mouth at bedtime.   simvastatin 20 MG tablet Commonly known as: ZOCOR Take 20 mg by mouth daily.   solifenacin 5 MG tablet Commonly known as: VESICARE Take 5 mg by mouth at bedtime.   Sure Comfort Insulin Syringe 30G X 1/2" 1 ML Misc Generic drug: Insulin Syringe-Needle U-100 USE AS DIRECTED PER  PROVIDER TO INJECT INSULIN DAILY.   BD Veo Insulin Syringe U/F 31G X 15/64" 1 ML Misc Generic drug: Insulin Syringe-Needle U-100 USE AS DIRECTED PER PROVIDER TO INJECT INSULIN DAILY.   Sure Comfort Pen Needles 31G X 8 MM Misc Generic drug: Insulin Pen Needle USE TO TEST 4 TIMES DAILY.   Symbicort 160-4.5 MCG/ACT inhaler Generic drug: budesonide-formoterol Inhale 2 puffs into the lungs 2 (two) times daily.   Vitamin D3 125 MCG (5000 UT) Caps Take 1 capsule (5,000 Units total) by mouth daily.       Signature:  Chesley Mires, MD [place] Pager - [phone] 11/07/2020, 9:41 AM

## 2020-11-07 NOTE — Telephone Encounter (Signed)
I have called the pt and he stated that this order for the oxygen supplies sent to [facility] was correct.    I called [facility] and spoke with them and they stated that the pt was in there this morning and stated that he needs the neb tubing.  New order has been sent in for this.  Nothing further is needed.

## 2020-11-08 ENCOUNTER — Telehealth: Payer: Self-pay

## 2020-11-08 LAB — ANGIOTENSIN CONVERTING ENZYME: Angiotensin-Converting Enzyme: 34 U/L (ref 14–82)

## 2020-11-08 LAB — ANA W/REFLEX IF POSITIVE: Anti Nuclear Antibody (ANA): NEGATIVE

## 2020-11-08 NOTE — Telephone Encounter (Signed)
Writer noted that on discharge summary it was written by Dr Halford Chessman to take Prednisone 40mg  daily. Script was written for 20mg , take one tablet daily. Verbal order from Dr Halford Chessman that patient is supposed to take 2 tablets to equal 40mg  daily until follow up office visit with Dr Halford Chessman in three weeks. Called patient and made him aware to take 2 tablets daily with food. Patient expressed full understanding. Nothing further needed at this time.

## 2020-11-22 ENCOUNTER — Ambulatory Visit: Payer: Medicaid Other

## 2020-11-23 ENCOUNTER — Ambulatory Visit (HOSPITAL_COMMUNITY)
Admission: RE | Admit: 2020-11-23 | Discharge: 2020-11-23 | Disposition: A | Discharge status: Home / Self Care | Payer: Medicaid Other | Source: Ambulatory Visit | Attending: Pulmonary Disease | Admitting: Pulmonary Disease

## 2020-11-23 ENCOUNTER — Other Ambulatory Visit (HOSPITAL_COMMUNITY): Payer: Medicaid Other

## 2020-11-23 ENCOUNTER — Other Ambulatory Visit: Payer: Self-pay

## 2020-11-23 DIAGNOSIS — J849 Interstitial pulmonary disease, unspecified: Secondary | ICD-10-CM

## 2020-11-26 ENCOUNTER — Other Ambulatory Visit: Payer: Self-pay

## 2020-11-26 ENCOUNTER — Ambulatory Visit (INDEPENDENT_AMBULATORY_CARE_PROVIDER_SITE_OTHER): Payer: Medicaid Other | Admitting: Pulmonary Disease

## 2020-11-26 ENCOUNTER — Encounter: Payer: Self-pay | Admitting: Pulmonary Disease

## 2020-11-26 VITALS — BP 134/86 | HR 99 | Temp 98.3°F | Ht 73.0 in | Wt 177.0 lb

## 2020-11-26 DIAGNOSIS — J849 Interstitial pulmonary disease, unspecified: Secondary | ICD-10-CM | POA: Diagnosis not present

## 2020-11-26 LAB — PULMONARY FUNCTION TEST
DL/VA % pred: 74 %
DL/VA: 3.35 ml/min/mmHg/L
DLCO cor % pred: 43 %
DLCO cor: 14.47 ml/min/mmHg
DLCO unc % pred: 36 %
DLCO unc: 12.1 ml/min/mmHg
FEF 25-75 Post: 1.25 L/sec
FEF 25-75 Pre: 1.15 L/sec
FEF2575-%Change-Post: 8 %
FEF2575-%Pred-Post: 31 %
FEF2575-%Pred-Pre: 29 %
FEV1-%Change-Post: 3 %
FEV1-%Pred-Post: 44 %
FEV1-%Pred-Pre: 43 %
FEV1-Post: 1.73 L
FEV1-Pre: 1.67 L
FEV1FVC-%Change-Post: 7 %
FEV1FVC-%Pred-Pre: 85 %
FEV6-%Change-Post: -2 %
FEV6-%Pred-Post: 49 %
FEV6-%Pred-Pre: 50 %
FEV6-Post: 2.31 L
FEV6-Pre: 2.37 L
FEV6FVC-%Pred-Post: 102 %
FEV6FVC-%Pred-Pre: 102 %
FVC-%Change-Post: -3 %
FVC-%Pred-Post: 48 %
FVC-%Pred-Pre: 50 %
FVC-Post: 2.31 L
FVC-Pre: 2.4 L
Post FEV1/FVC ratio: 75 %
Post FEV6/FVC ratio: 100 %
Pre FEV1/FVC ratio: 70 %
Pre FEV6/FVC Ratio: 100 %
RV % pred: 117 %
RV: 2.42 L
TLC % pred: 64 %
TLC: 4.89 L

## 2020-11-26 MED ORDER — PREDNISONE 10 MG PO TABS: 20.0000 mg | ORAL_TABLET | Freq: Every day | ORAL | 3 refills | Status: DC

## 2020-11-26 NOTE — Patient Instructions (Signed)
Prednisone 10 mg pill >> 2 pills daily  Will arrange for Lab tests to be done at [facility]  Follow up in [place] office in 6 weeks

## 2020-11-26 NOTE — Progress Notes (Signed)
Full PFT performed today. °

## 2020-11-26 NOTE — Progress Notes (Signed)
[facility] [facility], [facility]  Chief Complaint  Patient presents with   Follow-up    Follow up after PFT today.  BP has been running low.  Started on new BP med to raise his BP     Constitutional:  BP 134/86   Pulse 99   Temp 98.3 F (36.8 C) (Oral)   Ht 6\' 1"  (1.854 m)   Wt 177 lb (80.3 kg)   SpO2 99%   BMI 23.35 kg/m   Past Medical History:  Bipolar, ED, HTN, GERD, Pneumonia, HLD, DM type 2  Past Surgical History:  He  has a past surgical history that includes Appendectomy; Knee surgery (Bilateral); Cholecystectomy (N/A, 07/11/2013); Video bronchoscopy (Bilateral, 02/09/2018); and Incision and drainage abscess / hematoma of bursa / knee / thigh.  Brief Summary:  [name] is a 45 y.o. 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      Subjective:   ANCA, IgE not sent.    PFT showed moderate restriction and severe diffusion defect.  CT chest showed significant progression of fibrotic lung disease.  He felt better on prednisone  symptoms about the same otherwise.  Physical Exam:   Appearance - well kempt   ENMT - no sinus tenderness, no oral exudate, no LAN, Mallampati 3 airway, no stridor  Respiratory - b/l inspiratory/expiratory squeaks and rales  CV - s1s2 regular rate and rhythm, no murmurs  Ext - no clubbing, no edema  Skin - no rashes  Psych - normal mood and affect    Pulmonary testing:  Bronchoscopy 02/09/18 >> acute inflammation, 165 WBC (63% N, 2% L, 12% M, 23% E) IgE 02/16/18 >> 369 ACE 11/07/20 >> 34 PFT 11/26/20 >> FEV1 1.73 (44%), FEV1% 75, TLC 4.89 (64%), DLCO 36%  Chest Imaging:  CT chest 12/02/17 >> diffuse GGO with mild BTX, no honeycombing w/o change from 2014 HRCT chest 11/23/20 >> enlarged LN, bronchial wall thickening, mod/severe pulmonary fibrosis with extensive peribronchovascular areas of traction BTX, honeycombing, interlobular septal thickening, and architectural  distortion  Cardiac Tests:  Echo 11/02/20 >> EF 50 to 55%  Social History:  He  reports that he has been smoking cigarettes. He has a 5.00 pack-year smoking history. He has never used smokeless tobacco. He reports that he does not drink alcohol and does not use drugs.  Family History:  His family history includes CAD in his maternal grandmother; Diabetes in his maternal grandmother and mother.     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 - has significant progression compared to 2019 - resume prednisone 20 mg daily - send labs for IgE and ANCA - discussed option of referral to [facility] for second opinion and lung transplant assessment; he would like to see how he does with resuming prednisone first - he would like to defer 6MWT for now  CKD 2. - followed by Dr. Ulice Bold with [facility]  DM type 2. - follow up with his PCP to adjust regimen while he is on prednisone  Tobacco abuse. - he is using nicotine patch  Recreational marijuana use. - explained the concern about smoke inhalation of any kind in relation to his lung disease  Time Spent Involved in Patient Care on Day of Examination:  32 minutes  Follow up:   Patient Instructions  Prednisone 10 mg pill >> 2 pills daily  Will arrange for Lab tests to be done at [facility]  Follow up in [place]  office in 6 weeks  Medication List:   Allergies as of 11/26/2020   No Known Allergies      Medication List        Accurate as of November 26, 2020  1:54 PM. If you have any questions, ask your nurse or doctor.          Accu-Chek FastClix Lancets Misc USE TO TEST BLOOD SUGAR FOUR TIMES DAILY.   acetaminophen 325 MG tablet Commonly known as: TYLENOL Take 2 tablets (650 mg total) by mouth every 6 (six) hours as needed for mild pain (or Fever >/= 101).   albuterol 108 (90 Base) MCG/ACT inhaler Commonly known as: VENTOLIN HFA Inhale 2 puffs into the lungs  every 6 (six) hours as needed.   aspirin 81 MG EC tablet Take 1 tablet (81 mg total) by mouth daily with breakfast.   chlorthalidone 25 MG tablet Commonly known as: HYGROTON Take 25 mg by mouth daily.   divalproex 500 MG DR tablet Commonly known as: DEPAKOTE Take 2 tablets (1,000 mg total) by mouth at bedtime.   fenofibrate 145 MG tablet Commonly known as: TRICOR Take 145 mg by mouth daily.   furosemide 40 MG tablet Commonly known as: LASIX Take 40 mg by mouth daily as needed for edema.   insulin glargine 100 UNIT/ML injection Commonly known as: LANTUS Inject 0.35 mLs (35 Units total) into the skin daily.   midodrine 5 MG tablet Commonly known as: PROAMATINE Take 1 tablet (5 mg total) by mouth 3 (three) times daily with meals.   multivitamin tablet Take 1 tablet by mouth daily.   NovoLOG FlexPen 100 UNIT/ML FlexPen Generic drug: insulin aspart Inject 10-16 Units into the skin 3 (three) times daily before meals. What changed: how much to take   omeprazole 20 MG capsule Commonly known as: PRILOSEC Take 20 mg by mouth at bedtime.   OneTouch Verio test strip Generic drug: glucose blood 1 each by Other route 2 (two) times daily. And lancets 2/day   predniSONE 10 MG tablet Commonly known as: DELTASONE Take 2 tablets (20 mg total) by mouth daily with breakfast. What changed: medication strength Changed by: Chesley Mires, MD   risperidone 4 MG tablet Commonly known as: RISPERDAL Take 4 mg by mouth at bedtime.   simvastatin 20 MG tablet Commonly known as: ZOCOR Take 20 mg by mouth daily.   solifenacin 5 MG tablet Commonly known as: VESICARE Take 5 mg by mouth at bedtime.   Sure Comfort Insulin Syringe 30G X 1/2" 1 ML Misc Generic drug: Insulin Syringe-Needle U-100 USE AS DIRECTED PER PROVIDER TO INJECT INSULIN DAILY.   BD Veo Insulin Syringe U/F 31G X 15/64" 1 ML Misc Generic drug: Insulin Syringe-Needle U-100 USE AS DIRECTED PER PROVIDER TO INJECT INSULIN  DAILY.   Sure Comfort Pen Needles 31G X 8 MM Misc Generic drug: Insulin Pen Needle USE TO TEST 4 TIMES DAILY.   Symbicort 160-4.5 MCG/ACT inhaler Generic drug: budesonide-formoterol Inhale 2 puffs into the lungs 2 (two) times daily.   Vitamin D3 125 MCG (5000 UT) Caps Take 1 capsule (5,000 Units total) by mouth daily.        Signature:  Chesley Mires, MD [place] Pager - [phone] 11/26/2020, 1:54 PM

## 2020-11-26 NOTE — Patient Instructions (Signed)
Full PFT performed today. °

## 2020-11-29 ENCOUNTER — Telehealth: Payer: Self-pay | Admitting: Pulmonary Disease

## 2020-11-29 NOTE — Telephone Encounter (Signed)
Called patient but he did not answer. Left message for him to call back. Will need to get a verbal from him before sending his records.

## 2020-11-29 NOTE — Telephone Encounter (Signed)
Kathlee Nations from [facility] is requesting that the patients summary from (11/26/2020) be faxed to their office.  Fax number; [phone]; pls regard; [phone] (office number).

## 2020-12-05 NOTE — Telephone Encounter (Signed)
Ov note from 11/26/20 was faxed to [facility] clinic to the number provided.

## 2020-12-05 NOTE — Telephone Encounter (Signed)
Called and spoke to pt. Pt states he is fine with records going to [facility], it is his PCP.   Will forward to triage to fax records. Can close encounter once complete.

## 2020-12-18 ENCOUNTER — Other Ambulatory Visit: Payer: Self-pay | Admitting: "Endocrinology

## 2021-01-03 ENCOUNTER — Ambulatory Visit (INDEPENDENT_AMBULATORY_CARE_PROVIDER_SITE_OTHER): Payer: Medicaid Other | Admitting: Gastroenterology

## 2021-01-03 ENCOUNTER — Other Ambulatory Visit: Payer: Self-pay

## 2021-01-03 ENCOUNTER — Encounter (INDEPENDENT_AMBULATORY_CARE_PROVIDER_SITE_OTHER): Payer: Self-pay | Admitting: Gastroenterology

## 2021-01-03 VITALS — BP 137/88 | HR 121 | Temp 98.2°F | Ht 73.0 in | Wt 187.8 lb

## 2021-01-03 DIAGNOSIS — D509 Iron deficiency anemia, unspecified: Secondary | ICD-10-CM | POA: Diagnosis not present

## 2021-01-03 LAB — COMPREHENSIVE METABOLIC PANEL
ALT: 8 IU/L (ref 0–44)
AST: 8 IU/L (ref 0–40)
Albumin/Globulin Ratio: 1.1 — ABNORMAL LOW (ref 1.2–2.2)
Albumin: 3.7 g/dL — ABNORMAL LOW (ref 4.0–5.0)
Alkaline Phosphatase: 88 IU/L (ref 44–121)
BUN/Creatinine Ratio: 17 (ref 9–20)
BUN: 17 mg/dL (ref 6–24)
Bilirubin Total: <0.2 mg/dL (ref 0.0–1.2)
CO2: 24 mmol/L (ref 20–29)
Calcium: 9.3 mg/dL (ref 8.7–10.2)
Chloride: 96 mmol/L (ref 96–106)
Creatinine, Ser: 1.01 mg/dL (ref 0.76–1.27)
Globulin, Total: 3.3 g/dL (ref 1.5–4.5)
Glucose: 224 mg/dL — ABNORMAL HIGH (ref 65–99)
Potassium: 3.9 mmol/L (ref 3.5–5.2)
Sodium: 137 mmol/L (ref 134–144)
Total Protein: 7 g/dL (ref 6.0–8.5)
eGFR: 95 mL/min/{1.73_m2} (ref >59)

## 2021-01-03 LAB — CORTISOL-AM, BLOOD: Cortisol - AM: 2.3 ug/dL — ABNORMAL LOW (ref 6.2–19.4)

## 2021-01-03 NOTE — H&P (View-Only) (Signed)
Maylon [name], M.D. Gastroenterology & Hepatology [facility] [address] [place], [place] [address]  Primary Care Physician: Omar Gravel, MD [place] [address]  I will communicate my assessment and recommendations to the referring MD via EMR.  Problems: Anemia - combination of iron deficiency and anemia of chronic disease Fatty liver  History of Present Illness: [name] is a 45 y.o. 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  The patient was last seen on 07/18/2019. At that time, the patient was found to have hepatic steatosis but had normal LFTs upon repeat testing.  He had a liver elastography that was negative for advanced liver fibrosis.  He was advised to continue low-dose PPI for GERD control.He was recommended to have a colonoscopy for chronic hematochezia but did not pursue this.  The patient was referred by his nephrologist for evaluation of persistent anemia despite oral iron supplementation.  He had low iron stores on 08/2020 as his iron was 31 and saturation was 7%, ferritin was 360. He received two IV iron iinfusions but saturation persisted low. Last hb was 9.9 on 10/2020. Currently only taking one pill of iron.  Patient states feeling well.  Reported his only complaint was that he has lost weight unintentionally.He reports that he lost 45 lb in a year which was unintentional. He has gained back 10 lb the last few months. Has not changed his diet too much. Denies any other symptoms such as having any nausea, vomiting, fever, chills, hematochezia, melena, hematemesis, abdominal distention, abdominal pain, diarrhea, jaundice, pruritus.  Patient was admitted to [facility] on 6/22 and was found ot be persitently hypotensive. Was given midodrine 5 mg TID  and his BP has been better since then. He is off his hypertension medicines otherwise. No more lightheadedness, dizziness or syncope.    Notably, the patient was admitted to [facility] on 6/22 and was found ot be persitently hypotensive.  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  Was given midodrine 5 mg TID and his BP has been better since then. He is off his hypertension medicines otherwise. No more lightheadedness, dizziness or syncope.    Last [id number] Last Colonoscopy: denies  Smoke 3-4 cigs per day.  Past Medical History: Past Medical History:  Diagnosis Date   Bipolar disorder ([place])    Chronic respiratory failure ([place])    Erectile dysfunction    Essential hypertension    GERD (gastroesophageal reflux disease)    History of pneumonia    Hyperlipidemia    Interstitial lung disease (HCC)    Pulmonary eosinophilia   Obesity    Pulmonary eosinophilia (HCC)    Sinus tachycardia    Tobacco abuse    Type 2 diabetes mellitus ([place])     Past Surgical History: Past Surgical History:  Procedure Laterality Date   APPENDECTOMY     CHOLECYSTECTOMY N/A 07/11/2013   Procedure: LAPAROSCOPIC CHOLECYSTECTOMY;  Surgeon: Jamesetta So, MD;  Location: [facility];  Service: General;  Laterality: N/A;   INCISION AND DRAINAGE ABSCESS / HEMATOMA OF BURSA / KNEE / THIGH     KNEE SURGERY Bilateral    VIDEO BRONCHOSCOPY Bilateral 02/09/2018  Procedure: VIDEO BRONCHOSCOPY WITHOUT FLUORO;  Surgeon: Brand Males, MD;  Location: [facility];  Service: Cardiopulmonary;  Laterality: Bilateral;    Family History: Family History  Problem Relation Age of Onset   Diabetes Mother    Diabetes Maternal Grandmother    CAD Maternal Grandmother        heart attack in her 31s   Pancreatitis Neg Hx     Colon cancer Neg Hx     Social History: Social History   Tobacco Use  Smoking Status Some Days   Packs/day: 0.25   Years: 10.00   Pack years: 2.50   Types: Cigarettes  Smokeless Tobacco Never  Tobacco Comments   smokes 1 pack per week 11/07/20   Social History   Substance and Sexual Activity  Alcohol Use No   Social History   Substance and Sexual Activity  Drug Use No    Allergies: No Known Allergies  Medications: Current Outpatient Medications  Medication Sig Dispense Refill   Accu-Chek FastClix Lancets MISC USE TO TEST BLOOD SUGAR FOUR TIMES DAILY. 102 each 3   acetaminophen (TYLENOL) 325 MG tablet Take 2 tablets (650 mg total) by mouth every 6 (six) hours as needed for mild pain (or Fever >/= 101). 12 tablet 0   albuterol (VENTOLIN HFA) 108 (90 Base) MCG/ACT inhaler Inhale 2 puffs into the lungs every 6 (six) hours as needed. 18 g 0   aspirin EC 81 MG EC tablet Take 1 tablet (81 mg total) by mouth daily with breakfast. 30 tablet 11   BD VEO INSULIN SYRINGE U/F 31G X 15/64" 1 ML MISC USE AS DIRECTED PER PROVIDER TO INJECT INSULIN DAILY. 100 each 0   chlorthalidone (HYGROTON) 25 MG tablet Take 25 mg by mouth daily.     Cholecalciferol (VITAMIN D3) 125 MCG (5000 UT) CAPS Take 1 capsule (5,000 Units total) by mouth daily. 90 capsule 0   divalproex (DEPAKOTE) 500 MG DR tablet Take 2 tablets (1,000 mg total) by mouth at bedtime. 30 tablet 5   fenofibrate (TRICOR) 145 MG tablet Take 145 mg by mouth daily.     glucose blood (ONETOUCH VERIO) test strip 1 each by Other route 2 (two) times daily. And lancets 2/day 100 each 12   insulin aspart (NOVOLOG FLEXPEN) 100 UNIT/ML FlexPen Inject 10 Units into the skin 3 (three) times daily with meals. 15 mL 1   insulin glargine (LANTUS) 100 UNIT/ML injection Inject 0.35 mLs (35 Units total) into the skin daily. 15 mL 2   Insulin Syringe-Needle U-100 (SURE COMFORT INSULIN SYRINGE) 30G X 1/2" 1 ML MISC USE AS DIRECTED PER PROVIDER TO INJECT  INSULIN DAILY. 100 each 0   midodrine (PROAMATINE) 5 MG tablet Take 1 tablet (5 mg total) by mouth 3 (three) times daily with meals. 90 tablet 1   Multiple Vitamin (MULTIVITAMIN) tablet Take 1 tablet by mouth daily.     omeprazole (PRILOSEC) 20 MG capsule Take 20 mg by mouth at bedtime.     predniSONE (DELTASONE) 10 MG tablet Take 2 tablets (20 mg total) by mouth daily with breakfast. 60 tablet 3   risperidone (RISPERDAL) 4 MG tablet Take 4 mg by mouth at bedtime.      simvastatin (ZOCOR) 20 MG tablet Take 20 mg by mouth daily.     solifenacin (VESICARE) 5 MG tablet Take 5 mg by mouth at bedtime.      SURE COMFORT PEN NEEDLES 31G X 8 MM MISC USE TO TEST 4 TIMES DAILY. 100 each  6   SYMBICORT 160-4.5 MCG/ACT inhaler Inhale 2 puffs into the lungs 2 (two) times daily. 1 Inhaler 12   No current facility-administered medications for this visit.    Review of Systems: GENERAL: negative for malaise, night sweats HEENT: No changes in hearing or vision, no nose bleeds or other nasal problems. NECK: Negative for lumps, goiter, pain and significant neck swelling RESPIRATORY: Negative for cough, wheezing CARDIOVASCULAR: Negative for chest pain, leg swelling, palpitations, orthopnea GI: SEE HPI MUSCULOSKELETAL: Negative for joint pain or swelling, back pain, and muscle pain. SKIN: Negative for lesions, rash PSYCH: Negative for sleep disturbance, mood disorder and recent psychosocial stressors. HEMATOLOGY Negative for prolonged bleeding, bruising easily, and swollen nodes. ENDOCRINE: Negative for cold or heat intolerance, polyuria, polydipsia and goiter. NEURO: negative for tremor, gait imbalance, syncope and seizures. The remainder of the review of systems is noncontributory.   Physical Exam: BP 137/88 (BP Location: Right Arm, Patient Position: Sitting, Cuff Size: Large)   Pulse (!) 121   Temp 98.2 F (36.8 C) (Oral)   Ht '6\' 1"'$  (1.854 m)   Wt 187 lb 12.8 oz (85.2 kg)   BMI 24.78 kg/m   GENERAL: The patient is AO x3, in no acute distress. HEENT: Head is normocephalic and atraumatic. EOMI are intact. Mouth is well hydrated and without lesions. NECK: Supple. No masses LUNGS: Clear to auscultation. No presence of rhonchi/wheezing/rales. Adequate chest expansion HEART: RRR, normal s1 and s2. ABDOMEN: Soft, nontender, no guarding, no peritoneal signs, and nondistended. BS +. No masses. EXTREMITIES: Without any cyanosis, clubbing, rash, lesions or edema. NEUROLOGIC: AOx3, no focal motor deficit. SKIN: no jaundice, no rashes  Imaging/Labs: as above  I personally reviewed and interpreted the available labs, imaging and endoscopic files.  Impression and Plan: BRAEDEN ADDICKS is a 45 y.o. 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  The patient had evidence of some degree of iron deficiency in previous blood work-up which has improved with IV iron and oral iron intake but he is still presenting low iron saturation.  He has not presented any episodes of her gastrointestinal bleeding.  I do consider that his anemia needs to be further investigated with an EGD and a colonoscopy to evaluate any endoluminal lesions causing his anemia, however I also consider that a component of his persistent anemia is due to his chronic diseases.  Patient understood and agreed.  - Schedule EGD and colonoscopy - Continue oral iron daily  All questions were answered.      Harvel Quale, MD Gastroenterology and Hepatology [facility]

## 2021-01-03 NOTE — Progress Notes (Signed)
[name], M.D. Gastroenterology & Hepatology [facility] [address] [place],  [address]  Primary Care Physician: Jani Gravel, MD [place] [address]  I will communicate my assessment and recommendations to the referring MD via EMR.  Problems: Anemia - combination of iron deficiency and anemia of chronic disease Fatty liver  History of Present Illness: MELVON SUKO is a 45 y.o. 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  The patient was last seen on 07/18/2019. At that time, the patient was found to have hepatic steatosis but had normal LFTs upon repeat testing.  He had a liver elastography that was negative for advanced liver fibrosis.  He was advised to continue low-dose PPI for GERD control.He was recommended to have a colonoscopy for chronic hematochezia but did not pursue this.  The patient was referred by his nephrologist for evaluation of persistent anemia despite oral iron supplementation.  He had low iron stores on 08/2020 as his iron was 31 and saturation was 7%, ferritin was 360. He received two IV iron iinfusions but saturation persisted low. Last hb was 9.9 on 10/2020. Currently only taking one pill of iron.  Patient states feeling well.  Reported his only complaint was that he has lost weight unintentionally.He reports that he lost 45 lb in a year which was unintentional. He has gained back 10 lb the last few months. Has not changed his diet too much. Denies any other symptoms such as having any nausea, vomiting, fever, chills, hematochezia, melena, hematemesis, abdominal distention, abdominal pain, diarrhea, jaundice, pruritus.  Patient was admitted to [facility] on 6/22 and was found ot be persitently hypotensive. Was given midodrine 5 mg TID  and his BP has been better since then. He is off his hypertension medicines otherwise. No more lightheadedness, dizziness or syncope.    Notably, the patient was admitted to [facility] on 6/22 and was found ot be persitently hypotensive.  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  Was given midodrine 5 mg TID and his BP has been better since then. He is off his hypertension medicines otherwise. No more lightheadedness, dizziness or syncope.    Last [id number] Last Colonoscopy: denies  Smoke 3-4 cigs per day.  Past Medical History: Past Medical History:  Diagnosis Date   Bipolar disorder ([place])    Chronic respiratory failure ([place])    Erectile dysfunction    Essential hypertension    GERD (gastroesophageal reflux disease)    History of pneumonia    Hyperlipidemia    Interstitial lung disease (H[facility])    Pulmonary eosinophilia   Obesity    Pulmonary eosinophilia (H[facility])    Sinus tachycardia    Tobacco abuse    Type 2 diabetes mellitus ([place])     Past Surgical History: Past Surgical History:  Procedure Laterality Date   APPENDECTOMY     CHOLECYSTECTOMY N/A 07/11/2013   Procedure: LAPAROSCOPIC CHOLECYSTECTOMY;  Surgeon: Jamesetta So, MD;  Location: [facility];  Service: General;  Laterality: N/A;   INCISION AND DRAINAGE ABSCESS / HEMATOMA OF BURSA / KNEE / THIGH     KNEE SURGERY Bilateral    VIDEO BRONCHOSCOPY Bilateral 02/09/2018  Procedure: VIDEO BRONCHOSCOPY WITHOUT FLUORO;  Surgeon: Brand Males, MD;  [facility];  Service: Cardiopulmonary;  Laterality: Bilateral;    Family History: Family History  Problem Relation Age of Onset   Diabetes Mother    Diabetes Maternal Grandmother    CAD Maternal Grandmother        heart attack in her 86s   Pancreatitis Neg Hx     Colon cancer Neg Hx     Social History: Social History   Tobacco Use  Smoking Status Some Days   Packs/day: 0.25   Years: 10.00   Pack years: 2.50   Types: Cigarettes  Smokeless Tobacco Never  Tobacco Comments   smokes 1 pack per week 11/07/20   Social History   Substance and Sexual Activity  Alcohol Use No   Social History   Substance and Sexual Activity  Drug Use No    Allergies: No Known Allergies  Medications: Current Outpatient Medications  Medication Sig Dispense Refill   Accu-Chek FastClix Lancets MISC USE TO TEST BLOOD SUGAR FOUR TIMES DAILY. 102 each 3   acetaminophen (TYLENOL) 325 MG tablet Take 2 tablets (650 mg total) by mouth every 6 (six) hours as needed for mild pain (or Fever >/= 101). 12 tablet 0   albuterol (VENTOLIN HFA) 108 (90 Base) MCG/ACT inhaler Inhale 2 puffs into the lungs every 6 (six) hours as needed. 18 g 0   aspirin EC 81 MG EC tablet Take 1 tablet (81 mg total) by mouth daily with breakfast. 30 tablet 11   BD VEO INSULIN SYRINGE U/F 31G X 15/64" 1 ML MISC USE AS DIRECTED PER PROVIDER TO INJECT INSULIN DAILY. 100 each 0   chlorthalidone (HYGROTON) 25 MG tablet Take 25 mg by mouth daily.     Cholecalciferol (VITAMIN D3) 125 MCG (5000 UT) CAPS Take 1 capsule (5,000 Units total) by mouth daily. 90 capsule 0   divalproex (DEPAKOTE) 500 MG DR tablet Take 2 tablets (1,000 mg total) by mouth at bedtime. 30 tablet 5   fenofibrate (TRICOR) 145 MG tablet Take 145 mg by mouth daily.     glucose blood (ONETOUCH VERIO) test strip 1 each by Other route 2 (two) times daily. And lancets 2/day 100 each 12   insulin aspart (NOVOLOG FLEXPEN) 100 UNIT/ML FlexPen Inject 10 Units into the skin 3 (three) times daily with meals. 15 mL 1   insulin glargine (LANTUS) 100 UNIT/ML injection Inject 0.35 mLs (35 Units total) into the skin daily. 15 mL 2   Insulin Syringe-Needle U-100 (SURE COMFORT INSULIN SYRINGE) 30G X 1/2" 1 ML MISC USE AS DIRECTED PER PROVIDER TO INJECT  INSULIN DAILY. 100 each 0   midodrine (PROAMATINE) 5 MG tablet Take 1 tablet (5 mg total) by mouth 3 (three) times daily with meals. 90 tablet 1   Multiple Vitamin (MULTIVITAMIN) tablet Take 1 tablet by mouth daily.     omeprazole (PRILOSEC) 20 MG capsule Take 20 mg by mouth at bedtime.     predniSONE (DELTASONE) 10 MG tablet Take 2 tablets (20 mg total) by mouth daily with breakfast. 60 tablet 3   risperidone (RISPERDAL) 4 MG tablet Take 4 mg by mouth at bedtime.      simvastatin (ZOCOR) 20 MG tablet Take 20 mg by mouth daily.     solifenacin (VESICARE) 5 MG tablet Take 5 mg by mouth at bedtime.      SURE COMFORT PEN NEEDLES 31G X 8 MM MISC USE TO TEST 4 TIMES DAILY. 100 each  6   SYMBICORT 160-4.5 MCG/ACT inhaler Inhale 2 puffs into the lungs 2 (two) times daily. 1 Inhaler 12   No current facility-administered medications for this visit.    Review of Systems: GENERAL: negative for malaise, night sweats HEENT: No changes in hearing or vision, no nose bleeds or other nasal problems. NECK: Negative for lumps, goiter, pain and significant neck swelling RESPIRATORY: Negative for cough, wheezing CARDIOVASCULAR: Negative for chest pain, leg swelling, palpitations, orthopnea GI: SEE HPI MUSCULOSKELETAL: Negative for joint pain or swelling, back pain, and muscle pain. SKIN: Negative for lesions, rash PSYCH: Negative for sleep disturbance, mood disorder and recent psychosocial stressors. HEMATOLOGY Negative for prolonged bleeding, bruising easily, and swollen nodes. ENDOCRINE: Negative for cold or heat intolerance, polyuria, polydipsia and goiter. NEURO: negative for tremor, gait imbalance, syncope and seizures. The remainder of the review of systems is noncontributory.   Physical Exam: BP 137/88 (BP Location: Right Arm, Patient Position: Sitting, Cuff Size: Large)   Pulse (!) 121   Temp 98.2 F (36.8 C) (Oral)   Ht '6\' 1"'$  (1.854 m)   Wt 187 lb 12.8 oz (85.2 kg)   BMI 24.78 kg/m   GENERAL: The patient is AO x3, in no acute distress. HEENT: Head is normocephalic and atraumatic. EOMI are intact. Mouth is well hydrated and without lesions. NECK: Supple. No masses LUNGS: Clear to auscultation. No presence of rhonchi/wheezing/rales. Adequate chest expansion HEART: RRR, normal s1 and s2. ABDOMEN: Soft, nontender, no guarding, no peritoneal signs, and nondistended. BS +. No masses. EXTREMITIES: Without any cyanosis, clubbing, rash, lesions or edema. NEUROLOGIC: AOx3, no focal motor deficit. SKIN: no jaundice, no rashes  Imaging/Labs: as above  I personally reviewed and interpreted the available labs, imaging and endoscopic files.  Impression and Plan: AGUSTIN FARRAND is a 45 y.o. 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  The patient had evidence of some degree of iron deficiency in previous blood work-up which has improved with IV iron and oral iron intake but he is still presenting low iron saturation.  He has not presented any episodes of her gastrointestinal bleeding.  I do consider that his anemia needs to be further investigated with an EGD and a colonoscopy to evaluate any endoluminal lesions causing his anemia, however I also consider that a component of his persistent anemia is due to his chronic diseases.  Patient understood and agreed.  - Schedule EGD and colonoscopy - Continue oral iron daily  All questions were answered.      Harvel Quale, MD Gastroenterology and Hepatology [facility]

## 2021-01-03 NOTE — Patient Instructions (Addendum)
Schedule EGD and colonoscopy °Continue oral iron daily °

## 2021-01-04 ENCOUNTER — Other Ambulatory Visit (INDEPENDENT_AMBULATORY_CARE_PROVIDER_SITE_OTHER): Payer: Self-pay

## 2021-01-04 ENCOUNTER — Telehealth (INDEPENDENT_AMBULATORY_CARE_PROVIDER_SITE_OTHER): Payer: Self-pay

## 2021-01-04 MED ORDER — NA SULFATE-K SULFATE-MG SULF 17.5-3.13-1.6 GM/177ML PO SOLN: 1.0000 | ORAL | 0 refills | Status: AC

## 2021-01-04 NOTE — Telephone Encounter (Signed)
[name], CMA  

## 2021-01-07 ENCOUNTER — Encounter (INDEPENDENT_AMBULATORY_CARE_PROVIDER_SITE_OTHER): Payer: Self-pay

## 2021-01-10 NOTE — Patient Instructions (Signed)
[name]  01/10/2021     '@PREFPERIOPPHARMACY'$ @   Your procedure is scheduled on 01/15/2021.   Report to [facility] at  [place] [facility]   Call this number if you have problems the morning of surgery:  [phone]   Remember:  Follow the diet and prep instructions given to you by the office.    DO NOT take any medications for diabetes the morning of your procedure.    Use your inhalers before you coma and bring your rescue inhaler with you.     Take these medicines the morning of surgery with A SIP OF WATER                                 prednisone.    Do not wear jewelry, make-up or nail polish.  Do not wear lotions, powders, or perfumes, or deodorant.  Do not shave 48 hours prior to surgery.  Men may shave face and neck.  Do not bring valuables to the hospital.  [facility] is not responsible for any belongings or valuables.  Contacts, dentures or bridgework may not be worn into surgery.  Leave your suitcase in the car.  After surgery it may be brought to your room.  For patients admitted to the hospital, discharge time will be determined by your treatment team.  Patients discharged the day of surgery will not be allowed to drive home and must have someone with them for 24 hours.    Special instructions:   DO NOT smoke tobacco or vape for 24 hours before your procedure.  Please read over the following fact sheets that you were given. Anesthesia Post-op Instructions and Care and Recovery After Surgery      Upper Endoscopy, Adult, Care After This sheet gives you information about how to care for yourself after your procedure. Your health care provider may also give you more specific instructions. If you have problems or questions, contact your health careprovider. What can I expect after the procedure? After the procedure, it is common to have: A sore throat. Mild stomach pain or discomfort. Bloating. Nausea. Follow these instructions at  home:  Follow instructions from your health care provider about what to eat or drink after your procedure. Return to your normal activities as told by your health care provider. Ask your health care provider what activities are safe for you. Take over-the-counter and prescription medicines only as told by your health care provider. If you were given a sedative during the procedure, it can affect you for several hours. Do not drive or operate machinery until your health care provider says that it is safe. Keep all follow-up visits as told by your health care provider. This is important. Contact a health care provider if you have: A sore throat that lasts longer than one day. Trouble swallowing. Get help right away if: You vomit blood or your vomit looks like coffee grounds. You have: A fever. Bloody, black, or tarry stools. A severe sore throat or you cannot swallow. Difficulty breathing. Severe pain in your chest or abdomen. Summary After the procedure, it is common to have a sore throat, mild stomach discomfort, bloating, and nausea. If you were given a sedative during the procedure, it can affect you for several hours. Do not drive or operate machinery until your health care provider says that it is safe. Follow instructions from your health  care provider about what to eat or drink after your procedure. Return to your normal activities as told by your health care provider. This information is not intended to replace advice given to you by your health care provider. Make sure you discuss any questions you have with your healthcare provider. Document Revised: 05/17/2019 Document Reviewed: 10/19/2017 Elsevier Patient Education  2022 [place]. Colonoscopy, Adult, Care After This sheet gives you information about how to care for yourself after your procedure. Your health care provider may also give you more specific instructions. If you have problems or questions, contact your health  careprovider. What can I expect after the procedure? After the procedure, it is common to have: A small amount of blood in your stool for 24 hours after the procedure. Some gas. Mild cramping or bloating of your abdomen. Follow these instructions at home: Eating and drinking  Drink enough fluid to keep your urine pale yellow. Follow instructions from your health care provider about eating or drinking restrictions. Resume your normal diet as instructed by your health care provider. Avoid heavy or fried foods that are hard to digest.  Activity Rest as told by your health care provider. Avoid sitting for a long time without moving. Get up to take short walks every 1-2 hours. This is important to improve blood flow and breathing. Ask for help if you feel weak or unsteady. Return to your normal activities as told by your health care provider. Ask your health care provider what activities are safe for you. Managing cramping and bloating  Try walking around when you have cramps or feel bloated. Apply heat to your abdomen as told by your health care provider. Use the heat source that your health care provider recommends, such as a moist heat pack or a heating pad. Place a towel between your skin and the heat source. Leave the heat on for 20-30 minutes. Remove the heat if your skin turns bright red. This is especially important if you are unable to feel pain, heat, or cold. You may have a greater risk of getting burned.  General instructions If you were given a sedative during the procedure, it can affect you for several hours. Do not drive or operate machinery until your health care provider says that it is safe. For the first 24 hours after the procedure: Do not sign important documents. Do not drink alcohol. Do your regular daily activities at a slower pace than normal. Eat soft foods that are easy to digest. Take over-the-counter and prescription medicines only as told by your health care  provider. Keep all follow-up visits as told by your health care provider. This is important. Contact a health care provider if: You have blood in your stool 2-3 days after the procedure. Get help right away if you have: More than a small spotting of blood in your stool. Large blood clots in your stool. Swelling of your abdomen. Nausea or vomiting. A fever. Increasing pain in your abdomen that is not relieved with medicine. Summary After the procedure, it is common to have a small amount of blood in your stool. You may also have mild cramping and bloating of your abdomen. If you were given a sedative during the procedure, it can affect you for several hours. Do not drive or operate machinery until your health care provider says that it is safe. Get help right away if you have a lot of blood in your stool, nausea or vomiting, a fever, or increased pain in your  abdomen. This information is not intended to replace advice given to you by your health care provider. Make sure you discuss any questions you have with your healthcare provider. Document Revised: 05/13/2019 Document Reviewed: 12/13/2018 Elsevier Patient Education  [place] After This sheet gives you information about how to care for yourself after your procedure. Your health care provider may also give you more specific instructions. If you have problems or questions, contact your health careprovider. What can I expect after the procedure? After the procedure, it is common to have: Tiredness. Forgetfulness about what happened after the procedure. Impaired judgment for important decisions. Nausea or vomiting. Some difficulty with balance. Follow these instructions at home: For the time period you were told by your health care provider:     Rest as needed. Do not participate in activities where you could fall or become injured. Do not drive or use machinery. Do not drink alcohol. Do  not take sleeping pills or medicines that cause drowsiness. Do not make important decisions or sign legal documents. Do not take care of children on your own. Eating and drinking Follow the diet that is recommended by your health care provider. Drink enough fluid to keep your urine pale yellow. If you vomit: Drink water, juice, or soup when you can drink without vomiting. Make sure you have little or no nausea before eating solid foods. General instructions Have a responsible adult stay with you for the time you are told. It is important to have someone help care for you until you are awake and alert. Take over-the-counter and prescription medicines only as told by your health care provider. If you have sleep apnea, surgery and certain medicines can increase your risk for breathing problems. Follow instructions from your health care provider about wearing your sleep device: Anytime you are sleeping, including during daytime naps. While taking prescription pain medicines, sleeping medicines, or medicines that make you drowsy. Avoid smoking. Keep all follow-up visits as told by your health care provider. This is important. Contact a health care provider if: You keep feeling nauseous or you keep vomiting. You feel light-headed. You are still sleepy or having trouble with balance after 24 hours. You develop a rash. You have a fever. You have redness or swelling around the IV site. Get help right away if: You have trouble breathing. You have new-onset confusion at home. Summary For several hours after your procedure, you may feel tired. You may also be forgetful and have poor judgment. Have a responsible adult stay with you for the time you are told. It is important to have someone help care for you until you are awake and alert. Rest as told. Do not drive or operate machinery. Do not drink alcohol or take sleeping pills. Get help right away if you have trouble breathing, or if you suddenly  become confused. This information is not intended to replace advice given to you by your health care provider. Make sure you discuss any questions you have with your healthcare provider. Document Revised: 02/02/2020 Document Reviewed: 04/21/2019 Elsevier Patient Education  2022 [employer].

## 2021-01-11 ENCOUNTER — Encounter (HOSPITAL_COMMUNITY)
Admission: RE | Admit: 2021-01-11 | Discharge: 2021-01-11 | Disposition: A | Discharge status: Home / Self Care | Payer: Medicaid Other | Source: Ambulatory Visit | Attending: Gastroenterology | Admitting: Gastroenterology

## 2021-01-11 ENCOUNTER — Other Ambulatory Visit: Payer: Self-pay

## 2021-01-11 ENCOUNTER — Other Ambulatory Visit: Payer: Self-pay | Admitting: "Endocrinology

## 2021-01-11 DIAGNOSIS — Z01812 Encounter for preprocedural laboratory examination: Secondary | ICD-10-CM | POA: Insufficient documentation

## 2021-01-15 ENCOUNTER — Telehealth (INDEPENDENT_AMBULATORY_CARE_PROVIDER_SITE_OTHER): Payer: Self-pay | Admitting: *Deleted

## 2021-01-15 ENCOUNTER — Ambulatory Visit (HOSPITAL_COMMUNITY): Payer: Medicaid Other | Admitting: Anesthesiology

## 2021-01-15 ENCOUNTER — Ambulatory Visit (HOSPITAL_COMMUNITY)
Admission: RE | Admit: 2021-01-15 | Discharge: 2021-01-15 | Disposition: A | Discharge status: Home / Self Care | Payer: Medicaid Other | Attending: Gastroenterology | Admitting: Gastroenterology

## 2021-01-15 ENCOUNTER — Encounter (HOSPITAL_COMMUNITY)
Admission: RE | Disposition: A | Discharge status: Home / Self Care | Payer: Self-pay | Source: Home / Self Care | Attending: Gastroenterology

## 2021-01-15 ENCOUNTER — Encounter (INDEPENDENT_AMBULATORY_CARE_PROVIDER_SITE_OTHER): Payer: Self-pay

## 2021-01-15 ENCOUNTER — Encounter (HOSPITAL_COMMUNITY): Payer: Self-pay | Admitting: Gastroenterology

## 2021-01-15 DIAGNOSIS — K5730 Diverticulosis of large intestine without perforation or abscess without bleeding: Secondary | ICD-10-CM

## 2021-01-15 DIAGNOSIS — Z7952 Long term (current) use of systemic steroids: Secondary | ICD-10-CM | POA: Diagnosis not present

## 2021-01-15 DIAGNOSIS — D125 Benign neoplasm of sigmoid colon: Secondary | ICD-10-CM

## 2021-01-15 DIAGNOSIS — D123 Benign neoplasm of transverse colon: Secondary | ICD-10-CM

## 2021-01-15 DIAGNOSIS — Z7982 Long term (current) use of aspirin: Secondary | ICD-10-CM | POA: Diagnosis not present

## 2021-01-15 DIAGNOSIS — Z794 Long term (current) use of insulin: Secondary | ICD-10-CM | POA: Insufficient documentation

## 2021-01-15 DIAGNOSIS — E1165 Type 2 diabetes mellitus with hyperglycemia: Secondary | ICD-10-CM | POA: Insufficient documentation

## 2021-01-15 DIAGNOSIS — E785 Hyperlipidemia, unspecified: Secondary | ICD-10-CM | POA: Insufficient documentation

## 2021-01-15 DIAGNOSIS — Z9049 Acquired absence of other specified parts of digestive tract: Secondary | ICD-10-CM | POA: Diagnosis not present

## 2021-01-15 DIAGNOSIS — Z79899 Other long term (current) drug therapy: Secondary | ICD-10-CM | POA: Insufficient documentation

## 2021-01-15 DIAGNOSIS — J849 Interstitial pulmonary disease, unspecified: Secondary | ICD-10-CM | POA: Insufficient documentation

## 2021-01-15 DIAGNOSIS — Z8249 Family history of ischemic heart disease and other diseases of the circulatory system: Secondary | ICD-10-CM | POA: Insufficient documentation

## 2021-01-15 DIAGNOSIS — K648 Other hemorrhoids: Secondary | ICD-10-CM | POA: Diagnosis not present

## 2021-01-15 DIAGNOSIS — F17210 Nicotine dependence, cigarettes, uncomplicated: Secondary | ICD-10-CM | POA: Diagnosis not present

## 2021-01-15 DIAGNOSIS — D509 Iron deficiency anemia, unspecified: Secondary | ICD-10-CM | POA: Diagnosis not present

## 2021-01-15 DIAGNOSIS — E669 Obesity, unspecified: Secondary | ICD-10-CM | POA: Insufficient documentation

## 2021-01-15 DIAGNOSIS — I10 Essential (primary) hypertension: Secondary | ICD-10-CM | POA: Diagnosis not present

## 2021-01-15 DIAGNOSIS — Z7951 Long term (current) use of inhaled steroids: Secondary | ICD-10-CM | POA: Diagnosis not present

## 2021-01-15 DIAGNOSIS — Z833 Family history of diabetes mellitus: Secondary | ICD-10-CM | POA: Diagnosis not present

## 2021-01-15 DIAGNOSIS — Z6836 Body mass index (BMI) 36.0-36.9, adult: Secondary | ICD-10-CM | POA: Insufficient documentation

## 2021-01-15 DIAGNOSIS — K760 Fatty (change of) liver, not elsewhere classified: Secondary | ICD-10-CM | POA: Diagnosis not present

## 2021-01-15 HISTORY — PX: BIOPSY: SHX5522

## 2021-01-15 HISTORY — PX: COLONOSCOPY WITH PROPOFOL: SHX5780

## 2021-01-15 HISTORY — PX: POLYPECTOMY: SHX5525

## 2021-01-15 HISTORY — PX: ESOPHAGOGASTRODUODENOSCOPY (EGD) WITH PROPOFOL: SHX5813

## 2021-01-15 LAB — HM COLONOSCOPY

## 2021-01-15 LAB — GLUCOSE, CAPILLARY: Glucose-Capillary: 98 mg/dL (ref 70–99)

## 2021-01-15 SURGERY — COLONOSCOPY WITH PROPOFOL
Anesthesia: General

## 2021-01-15 MED ORDER — HYDROCORTISONE NA SUCCINATE PF 100 MG IJ SOLR
100.0000 mg | Freq: Once | INTRAMUSCULAR | Status: AC
  Administered 2021-01-15: 100 mg via INTRAVENOUS

## 2021-01-15 MED ORDER — PROPOFOL 10 MG/ML IV BOLUS
INTRAVENOUS | Status: DC | PRN
  Administered 2021-01-15: 50 mg via INTRAVENOUS
  Administered 2021-01-15: 100 mg via INTRAVENOUS
  Administered 2021-01-15: 125 ug/kg/min via INTRAVENOUS
  Administered 2021-01-15: 50 mg via INTRAVENOUS

## 2021-01-15 MED ORDER — IPRATROPIUM-ALBUTEROL 0.5-2.5 (3) MG/3ML IN SOLN
RESPIRATORY_TRACT | Status: AC
  Filled 2021-01-15: qty 3

## 2021-01-15 MED ORDER — HYDROCORTISONE NA SUCCINATE PF 100 MG IJ SOLR
INTRAMUSCULAR | Status: AC
  Filled 2021-01-15: qty 2

## 2021-01-15 MED ORDER — LACTATED RINGERS IV SOLN
INTRAVENOUS | Status: DC
  Administered 2021-01-15: 1000 mL via INTRAVENOUS

## 2021-01-15 MED ORDER — STERILE WATER FOR IRRIGATION IR SOLN
Status: DC | PRN
  Administered 2021-01-15: 300 mL

## 2021-01-15 MED ORDER — IPRATROPIUM-ALBUTEROL 0.5-2.5 (3) MG/3ML IN SOLN
3.0000 mL | Freq: Once | RESPIRATORY_TRACT | Status: AC
  Administered 2021-01-15: 3 mL via RESPIRATORY_TRACT

## 2021-01-15 MED ORDER — LIDOCAINE HCL (CARDIAC) PF 100 MG/5ML IV SOSY
PREFILLED_SYRINGE | INTRAVENOUS | Status: DC | PRN
  Administered 2021-01-15: 100 mg via INTRATRACHEAL

## 2021-01-15 MED ORDER — PHENYLEPHRINE HCL (PRESSORS) 10 MG/ML IV SOLN
INTRAVENOUS | Status: DC | PRN
  Administered 2021-01-15: 80 ug via INTRAVENOUS

## 2021-01-15 NOTE — Discharge Instructions (Addendum)
You are being discharged to home.  Resume your previous diet.  We are waiting for your pathology results. Continue oral iron.  Your physician has recommended a repeat colonoscopy for surveillance based on pathology results.  Schedule capsule endoscopy.

## 2021-01-15 NOTE — Op Note (Signed)
[facility] Patient Name: [name] Procedure Date: 01/15/2021 10:44 AM MRN: [id number] Date of Birth: 06-24-1975 Attending MD: [name] ,  CSN: [id number] Age: 45 Admit Type: Outpatient Procedure:                Colonoscopy Indications:              Iron deficiency anemia Providers:                [name], Lambert Mody, Kristine L.                            Risa Grill, Technician Referring MD:              Medicines:                Monitored Anesthesia Care Complications:            No immediate complications. Estimated Blood Loss:     Estimated blood loss: none. Procedure:                Pre-Anesthesia Assessment:                           - Prior to the procedure, a History and Physical                            was performed, and patient medications, allergies                            and sensitivities were reviewed. The patient's                            tolerance of previous anesthesia was reviewed.                           - The risks and benefits of the procedure and the                            sedation options and risks were discussed with the                            patient. All questions were answered and informed                            consent was obtained.                           - ASA Grade Assessment: III - A patient with severe                            systemic disease.                           After obtaining informed consent, the colonoscope                            was passed under direct vision. Throughout the  procedure, the patient's blood pressure, pulse, and                            oxygen saturations were monitored continuously. The                            [id number]) was introduced through the                            anus and advanced to the the terminal ileum. The                            colonoscopy was performed without difficulty. The                             patient tolerated the procedure well. The quality                            of the bowel preparation was good. Scope In: 10:47:47 AM Scope Out: 11:12:41 AM Scope Withdrawal Time: 0 hours 19 minutes 42 seconds  Total Procedure Duration: 0 hours 24 minutes 54 seconds  Findings:      The perianal and digital rectal examinations were normal.      The terminal ileum appeared normal.      Four sessile polyps were found in the sigmoid colon and transverse       colon. The polyps were 4 to 8 mm in size. These polyps were removed with       a cold snare. Resection and retrieval were complete.      A few small-mouthed diverticula were found in the sigmoid colon.      Non-bleeding internal hemorrhoids were found during retroflexion. The       hemorrhoids were medium-sized. Impression:               - The examined portion of the ileum was normal.                           - Four 4 to 8 mm polyps in the sigmoid colon and in                            the transverse colon, removed with a cold snare.                            Resected and retrieved.                           - Diverticulosis in the sigmoid colon.                           - Non-bleeding internal hemorrhoids. Moderate Sedation:      Per Anesthesia Care Recommendation:           - Discharge patient to home (ambulatory).                           - Resume previous diet.                           -  Await pathology results.                           - Repeat colonoscopy for surveillance based on                            pathology results.                           - Schedule capsule endoscopy. Procedure Code(s):        --- Professional ---                           [phone], Colonoscopy, flexible; with removal of                            tumor(s), polyp(s), or other lesion(s) by snare                            technique Diagnosis Code(s):        --- Professional ---                           K64.8, Other hemorrhoids                            K63.5, Polyp of colon                           D50.9, Iron deficiency anemia, unspecified                           K57.30, Diverticulosis of large intestine without                            perforation or abscess without bleeding CPT copyright 2019 American Medical Association. All rights reserved. The codes documented in this report are preliminary and upon coder review may  be revised to meet current compliance requirements. [name], MD [name],  01/15/2021 11:19:45 AM This report has been signed electronically. Number of Addenda: 0

## 2021-01-15 NOTE — Anesthesia Postprocedure Evaluation (Signed)
Anesthesia Post Note  Patient: [name]  Procedure(s) Performed: COLONOSCOPY WITH PROPOFOL ESOPHAGOGASTRODUODENOSCOPY (EGD) WITH PROPOFOL BIOPSY POLYPECTOMY  Patient location during evaluation: Phase II Anesthesia Type: General Level of consciousness: awake and alert and oriented Pain management: pain level controlled Vital Signs Assessment: post-procedure vital signs reviewed and stable Respiratory status: spontaneous breathing and respiratory function stable Cardiovascular status: blood pressure returned to baseline and stable Postop Assessment: no apparent nausea or vomiting Anesthetic complications: no   No notable events documented.   Last Vitals:  Vitals:   01/15/21 1121 01/15/21 1125  BP:  113/67  Pulse: 96   Resp: 18   Temp: 36.6 C   SpO2: 97%     Last Pain:  Vitals:   01/15/21 1121  TempSrc: Oral  PainSc: 0-No pain                 Bartlett Enke C Takoda Janowiak

## 2021-01-15 NOTE — Telephone Encounter (Signed)
Per TCS/EGD op note patient needs GIVENS capsule scheduled

## 2021-01-15 NOTE — Interval H&P Note (Signed)
History and Physical Interval Note:  01/15/2021 9:48 AM [name] is a 45 y.o. 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  Patient denies having any complaints.  Denies having any melena, hematochezia, abdominal pain, distention, fever, chills, nausea or vomiting.  States feeling well overall.  Has been taking his iron compliantly.  BP (!) 130/91   Pulse (!) 102   Temp 98.5 F (36.9 C) (Oral)   Resp 20   SpO2 98%  GENERAL: The patient is AO x3, in no acute distress. HEENT: Head is normocephalic and atraumatic. EOMI are intact. Mouth is well hydrated and without lesions. NECK: Supple. No masses LUNGS: Clear to auscultation. No presence of rhonchi/wheezing/rales. Adequate chest expansion HEART: RRR, normal s1 and s2. ABDOMEN: Soft, nontender, no guarding, no peritoneal signs, and nondistended. BS +. No masses. EXTREMITIES: Without any cyanosis, clubbing, rash, lesions or edema. NEUROLOGIC: AOx3, no focal motor deficit. SKIN: no jaundice, no rashes   [name]  has presented today for surgery, with the diagnosis of Iron deficiency anemia.  The various methods of treatment have been discussed with the patient and family. After consideration of risks, benefits and other options for treatment, the patient has consented to  Procedure(s) with comments: COLONOSCOPY WITH PROPOFOL (N/A) - 10:40 ESOPHAGOGASTRODUODENOSCOPY (EGD) WITH PROPOFOL (N/A) as a surgical intervention.  The patient's history has been reviewed, patient examined, no change in status, stable for surgery.  I have reviewed the patient's chart and labs.  Questions were answered to the patient's satisfaction.     [name]

## 2021-01-15 NOTE — Op Note (Signed)
[facility] Patient Name: [name] Procedure Date: 01/15/2021 10:21 AM MRN: [id number] Date of Birth: 1976-04-21 Attending MD: [name] ,  CSN: [id number] Age: 45 Admit Type: Outpatient Procedure:                Upper GI endoscopy Indications:              Iron deficiency anemia Providers:                [name], Lambert Mody, Kristine L.                            Risa Grill, Technician Referring MD:              Medicines:                Monitored Anesthesia Care Complications:            No immediate complications. Estimated Blood Loss:     Estimated blood loss: none. Procedure:                Pre-Anesthesia Assessment:                           - Prior to the procedure, a History and Physical                            was performed, and patient medications, allergies                            and sensitivities were reviewed. The patient's                            tolerance of previous anesthesia was reviewed.                           - The risks and benefits of the procedure and the                            sedation options and risks were discussed with the                            patient. All questions were answered and informed                            consent was obtained.                           - ASA Grade Assessment: III - A patient with severe                            systemic disease.                           After obtaining informed consent, the endoscope was                            passed under direct vision. Throughout the  procedure, the patient's blood pressure, pulse, and                            oxygen saturations were monitored continuously. The                            [id number]) scope was introduced through the                            mouth, and advanced to the second part of duodenum.                            The upper GI endoscopy was accomplished without                             difficulty. The patient tolerated the procedure                            well. Scope In: 10:35:47 AM Scope Out: 10:42:32 AM Total Procedure Duration: 0 hours 6 minutes 45 seconds  Findings:      The examined esophagus was normal.      The entire examined stomach was normal.      The examined duodenum was normal. Biopsies were taken with a cold       forceps for histology. Impression:               - Normal esophagus.                           - Normal stomach.                           - Normal examined duodenum. Biopsied. Moderate Sedation:      Per Anesthesia Care Recommendation:           - Discharge patient to home (ambulatory).                           - Resume previous diet.                           - Await pathology results.                           - Continue oral iron. Procedure Code(s):        --- Professional ---                           [phone], Esophagogastroduodenoscopy, flexible,                            transoral; with biopsy, single or multiple Diagnosis Code(s):        --- Professional ---                           D50.9, Iron deficiency anemia, unspecified CPT copyright 2019 American Medical Association. All rights reserved. The codes documented in this report are preliminary  and upon coder review may  be revised to meet current compliance requirements. [name], MD [name],  01/15/2021 10:44:59 AM This report has been signed electronically. Number of Addenda: 0

## 2021-01-15 NOTE — Transfer of Care (Signed)
Immediate Anesthesia Transfer of Care Note  Patient: [name]  Procedure(s) Performed: COLONOSCOPY WITH PROPOFOL ESOPHAGOGASTRODUODENOSCOPY (EGD) WITH PROPOFOL BIOPSY POLYPECTOMY  Patient Location: Short Stay  Anesthesia Type:General  Level of Consciousness: awake, alert , oriented and patient cooperative  Airway & Oxygen Therapy: Patient Spontanous Breathing  Post-op Assessment: Report given to RN, Post -op Vital signs reviewed and stable and Patient moving all extremities X 4  Post vital signs: Reviewed and stable  Last Vitals:  Vitals Value Taken Time  BP    Temp    Pulse    Resp    SpO2      Last Pain:  Vitals:   01/15/21 1035  TempSrc:   PainSc: 0-No pain      Patients Stated Pain Goal: 5 ([id number])  Complications: No notable events documented.

## 2021-01-15 NOTE — Anesthesia Preprocedure Evaluation (Signed)
Anesthesia Evaluation  Patient identified by MRN, date of birth, ID band Patient awake    Reviewed: Allergy & Precautions, NPO status , Patient's Chart, lab work & pertinent test results  Airway Mallampati: I  TM Distance: >3 FB Neck ROM: Full    Dental  (+) Dental Advisory Given, Missing, Chipped, Poor Dentition   Pulmonary shortness of breath (room air oxygen sat  89 to 91) and with exertion, COPD (interstetial lung disease, pulmonary esinophilia),  oxygen dependent, Current Smoker and Patient abstained from smoking.,     + wheezing      Cardiovascular Exercise Tolerance: Poor hypertension, + Peripheral Vascular Disease  Normal cardiovascular exam+ dysrhythmias Atrial Fibrillation  Rhythm:Regular Rate:Normal  1. Left ventricular ejection fraction, by estimation, is 50 to 55%. The left ventricle has normal function. The left ventricle has no regional wall motion abnormalities. There is moderate left ventricular hypertrophy.  Left ventricular diastolic parameters were normal. The average left ventricular global longitudinal strain is -16.0 %. The global longitudinal strain is normal.  2. Right ventricular systolic function is normal. The right ventricular size is normal.  3. The mitral valve is normal in structure. Trivial mitral valve regurgitation. No evidence of mitral stenosis.  4. The aortic valve is tricuspid. Aortic valve regurgitation is not visualized. No aortic stenosis is present.  5. The inferior vena cava is normal in size with greater than 50% respiratory variability, suggesting right atrial pressure of 3 mmHg.   Neuro/Psych PSYCHIATRIC DISORDERS Bipolar Disorder negative neurological ROS     GI/Hepatic GERD  Medicated and Controlled,  Endo/Other  diabetes, Well Controlled, Type 2, Oral Hypoglycemic Agents, Insulin Dependent  Renal/GU Renal InsufficiencyRenal disease     Musculoskeletal   Abdominal   Peds   Hematology  (+) anemia ,   Anesthesia Other Findings On prednisone   Reproductive/Obstetrics                            Anesthesia Physical Anesthesia Plan  ASA: 3  Anesthesia Plan: General   Post-op Pain Management:    Induction: Intravenous  PONV Risk Score and Plan: Propofol infusion  Airway Management Planned: Nasal Cannula and Natural Airway  Additional Equipment:   Intra-op Plan:   Post-operative Plan:   Informed Consent: I have reviewed the patients History and Physical, chart, labs and discussed the procedure including the risks, benefits and alternatives for the proposed anesthesia with the patient or authorized representative who has indicated his/her understanding and acceptance.     Dental advisory given  Plan Discussed with: CRNA and Surgeon  Anesthesia Plan Comments:        Anesthesia Quick Evaluation

## 2021-01-16 ENCOUNTER — Ambulatory Visit (INDEPENDENT_AMBULATORY_CARE_PROVIDER_SITE_OTHER): Payer: Medicaid Other | Admitting: "Endocrinology

## 2021-01-16 ENCOUNTER — Encounter: Payer: Self-pay | Admitting: "Endocrinology

## 2021-01-16 ENCOUNTER — Other Ambulatory Visit: Payer: Self-pay

## 2021-01-16 VITALS — BP 124/82 | HR 112 | Ht 73.0 in | Wt 188.0 lb

## 2021-01-16 DIAGNOSIS — E1110 Type 2 diabetes mellitus with ketoacidosis without coma: Secondary | ICD-10-CM

## 2021-01-16 DIAGNOSIS — F17200 Nicotine dependence, unspecified, uncomplicated: Secondary | ICD-10-CM

## 2021-01-16 DIAGNOSIS — E782 Mixed hyperlipidemia: Secondary | ICD-10-CM | POA: Diagnosis not present

## 2021-01-16 DIAGNOSIS — I10 Essential (primary) hypertension: Secondary | ICD-10-CM | POA: Diagnosis not present

## 2021-01-16 DIAGNOSIS — E559 Vitamin D deficiency, unspecified: Secondary | ICD-10-CM | POA: Diagnosis not present

## 2021-01-16 LAB — SURGICAL PATHOLOGY

## 2021-01-16 MED ORDER — INSULIN GLARGINE 100 UNIT/ML ~~LOC~~ SOLN: 50.0000 [IU] | Freq: Every day | SUBCUTANEOUS | 1 refills | Status: DC

## 2021-01-16 MED ORDER — NOVOLOG FLEXPEN 100 UNIT/ML ~~LOC~~ SOPN: 14.0000 [IU] | PEN_INJECTOR | Freq: Three times a day (TID) | SUBCUTANEOUS | 1 refills | Status: DC

## 2021-01-16 NOTE — Progress Notes (Signed)
01/16/2021, 1:22 PM   Endocrinology follow-up note  Subjective:    Patient ID: [name], male    DOB: December 30, 1975.  [name] is being seen in follow-up after he was seen in consultation for management of currently uncontrolled symptomatic diabetes requested by  Jani Gravel, MD.   Past Medical History:  Diagnosis Date   Bipolar disorder ([place])    Chronic respiratory failure ([place])    Erectile dysfunction    Essential hypertension    GERD (gastroesophageal reflux disease)    History of pneumonia    Hyperlipidemia    Interstitial lung disease ([place])    Pulmonary eosinophilia   Obesity    Pulmonary eosinophilia ([place])    Sinus tachycardia    Tobacco abuse    Type 2 diabetes mellitus ([place])     Past Surgical History:  Procedure Laterality Date   APPENDECTOMY     CHOLECYSTECTOMY N/A 07/11/2013   Procedure: LAPAROSCOPIC CHOLECYSTECTOMY;  Surgeon: Jamesetta So, MD;  Location: [facility];  Service: General;  Laterality: N/A;   INCISION AND DRAINAGE ABSCESS / HEMATOMA OF BURSA / KNEE / THIGH     KNEE SURGERY Bilateral    VIDEO BRONCHOSCOPY Bilateral 02/09/2018   Procedure: VIDEO BRONCHOSCOPY WITHOUT FLUORO;  Surgeon: Brand Males, MD;  Location: [facility];  Service: Cardiopulmonary;  Laterality: Bilateral;    Social History   Socioeconomic History   Marital status: Single    Spouse name: Not on file   Number of children: 0   Years of education: Not on file   Highest education level: Not on file  Occupational History   Not on file  Tobacco Use   Smoking status: Some Days    Packs/day: 0.25    Years: 10.00    Pack years: 2.50    Types: Cigarettes   Smokeless tobacco: Never   Tobacco comments:    smokes 1 pack per week 11/07/20  Vaping Use   Vaping Use: Never used  Substance and Sexual Activity   Alcohol use: No   Drug use: No   Sexual activity: Never  Other  Topics Concern   Not on file  Social History Narrative   Not on file   Social Determinants of Health   Financial Resource Strain: Not on file  Food Insecurity: Not on file  Transportation Needs: Not on file  Physical Activity: Not on file  Stress: Not on file  Social Connections: Not on file    Family History  Problem Relation Age of Onset   Diabetes Mother    Diabetes Maternal Grandmother    CAD Maternal Grandmother        heart attack in her 39s   Pancreatitis Neg Hx    Colon cancer Neg Hx     Outpatient Encounter Medications as of 01/16/2021  Medication Sig   Accu-Chek FastClix Lancets MISC USE TO TEST BLOOD SUGAR FOUR TIMES DAILY.   acetaminophen (TYLENOL) 500 MG tablet Take 500-1,000 mg by mouth See admin instructions. Take 1-2 tablets (500-1,000 mg) by mouth every 6 hours as needed for pain & take 2 tablets (  1000 mg) by mouth SCHEDULED at bedtime.   albuterol (VENTOLIN HFA) 108 (90 Base) MCG/ACT inhaler Inhale 2 puffs into the lungs every 6 (six) hours as needed. (Patient taking differently: Inhale 2 puffs into the lungs every 6 (six) hours as needed for shortness of breath or wheezing.)   aspirin EC 81 MG EC tablet Take 1 tablet (81 mg total) by mouth daily with breakfast.   BD VEO INSULIN SYRINGE U/F 31G X 15/64" 1 ML MISC USE AS DIRECTED PER PROVIDER TO INJECT INSULIN DAILY.   chlorthalidone (HYGROTON) 25 MG tablet Take 25 mg by mouth at bedtime.   divalproex (DEPAKOTE) 500 MG DR tablet Take 2 tablets (1,000 mg total) by mouth at bedtime.   fenofibrate (TRICOR) 145 MG tablet Take 145 mg by mouth at bedtime.   ferrous sulfate 325 (65 FE) MG tablet Take 325 mg by mouth daily with breakfast.   glucose blood (ONETOUCH VERIO) test strip 1 each by Other route 2 (two) times daily. And lancets 2/day   insulin aspart (NOVOLOG FLEXPEN) 100 UNIT/ML FlexPen Inject 14-20 Units into the skin 3 (three) times daily with meals.   insulin glargine (LANTUS) 100 UNIT/ML injection Inject 0.5  mLs (50 Units total) into the skin at bedtime.   Insulin Syringe-Needle U-100 (SURE COMFORT INSULIN SYRINGE) 30G X 1/2" 1 ML MISC USE AS DIRECTED PER PROVIDER TO INJECT INSULIN DAILY.   midodrine (PROAMATINE) 5 MG tablet Take 1 tablet (5 mg total) by mouth 3 (three) times daily with meals.   Multiple Vitamin (MULTIVITAMIN WITH MINERALS) TABS tablet Take 1 tablet by mouth in the morning. One-A-Day Men's Multivitamin   omeprazole (PRILOSEC) 20 MG capsule Take 20 mg by mouth at bedtime.   predniSONE (DELTASONE) 10 MG tablet Take 2 tablets (20 mg total) by mouth daily with breakfast.   risperidone (RISPERDAL) 4 MG tablet Take 4 mg by mouth at bedtime.    simvastatin (ZOCOR) 20 MG tablet Take 20 mg by mouth at bedtime.   solifenacin (VESICARE) 5 MG tablet Take 5 mg by mouth at bedtime.    SURE COMFORT PEN NEEDLES 31G X 8 MM MISC USE TO TEST 4 TIMES DAILY.   SYMBICORT 160-4.5 MCG/ACT inhaler Inhale 2 puffs into the lungs 2 (two) times daily.   [DISCONTINUED] insulin aspart (NOVOLOG FLEXPEN) 100 UNIT/ML FlexPen Inject 10 Units into the skin 3 (three) times daily with meals. (Patient taking differently: Inject 10-20 Units into the skin 3 (three) times daily with meals. Sliding scale insulin)   [DISCONTINUED] insulin glargine (LANTUS) 100 UNIT/ML injection Inject 0.35 mLs (35 Units total) into the skin at bedtime.   No facility-administered encounter medications on file as of 01/16/2021.    ALLERGIES: No Known Allergies  VACCINATION STATUS: Immunization History  Administered Date(s) Administered   Influenza,inj,Quad PF,6+ Mos 06/06/2013   Moderna Sars-Covid-2 Vaccination 08/13/2019, 09/14/2019    Diabetes He presents for his follow-up diabetic visit. He has type 2 diabetes mellitus. Onset time: He was diagnosed at approximate age of 45 years. His disease course has been worsening (He weighed approximately 270 pounds during the time of his diagnosis.  He has chronic uncontrolled diabetes with  previous episodes of impending diabetic ketoacidosis, hyperosmolar state. ). There are no hypoglycemic associated symptoms. Pertinent negatives for hypoglycemia include no confusion, headaches, pallor or seizures. Associated symptoms include blurred vision, fatigue, polydipsia, polyuria and weight loss. Pertinent negatives for diabetes include no chest pain, no polyphagia and no weakness. (He recently had pancreatitis which caused unintended weight  loss.) There are no hypoglycemic complications. Symptoms are worsening. Diabetic complications include peripheral neuropathy and PVD. Risk factors for coronary artery disease include diabetes mellitus, dyslipidemia, family history, male sex, hypertension, sedentary lifestyle and tobacco exposure. Current diabetic treatment includes insulin injections. His weight is increasing steadily. He is following a generally unhealthy diet. When asked about meal planning, he reported none. He has not had a previous visit with a dietitian. He never participates in exercise. His home blood glucose trend is increasing steadily. His breakfast blood glucose range is generally >200 mg/dl. His lunch blood glucose range is generally >200 mg/dl. His dinner blood glucose range is generally >200 mg/dl. His bedtime blood glucose range is generally >200 mg/dl. (He returns with his meter and logs showing significantly above target glycemic profile.  His recent A1c was 8.6%.  He was recently initiated on prednisone by his pulmonologist which led to persistent hyperglycemia. ) An ACE inhibitor/angiotensin II receptor blocker is being taken. Eye exam is current.  Hypertension This is a chronic problem. The problem is controlled. Associated symptoms include blurred vision. Pertinent negatives include no chest pain, headaches, neck pain, palpitations or shortness of breath. Risk factors for coronary artery disease include dyslipidemia, diabetes mellitus, male gender, sedentary lifestyle and  smoking/tobacco exposure. Past treatments include ACE inhibitors. Hypertensive end-organ damage includes PVD.  Hyperlipidemia This is a chronic problem. The current episode started more than 1 year ago. The problem is uncontrolled. Exacerbating diseases include diabetes. Associated symptoms include myalgias. Pertinent negatives include no chest pain or shortness of breath. Current antihyperlipidemic treatment includes statins. Risk factors for coronary artery disease include diabetes mellitus, dyslipidemia, family history, male sex, hypertension and a sedentary lifestyle.    Review of Systems  Constitutional:  Positive for fatigue and weight loss. Negative for chills, fever and unexpected weight change.  HENT:  Negative for dental problem, mouth sores and trouble swallowing.   Eyes:  Positive for blurred vision. Negative for visual disturbance.  Respiratory:  Negative for cough, choking, chest tightness, shortness of breath and wheezing.   Cardiovascular:  Negative for chest pain, palpitations and leg swelling.  Gastrointestinal:  Negative for abdominal distention, abdominal pain, constipation, diarrhea, nausea and vomiting.  Endocrine: Positive for polydipsia and polyuria. Negative for polyphagia.  Genitourinary:  Negative for dysuria, flank pain, hematuria and urgency.  Musculoskeletal:  Positive for myalgias. Negative for back pain, gait problem and neck pain.  Skin:  Negative for pallor, rash and wound.  Neurological:  Negative for seizures, syncope, weakness, numbness and headaches.  Psychiatric/Behavioral:  Negative for confusion and dysphoric mood.    Objective:    Vitals with BMI 01/16/2021 01/15/2021 01/15/2021  Height '6\' 1"'$  - -  Weight 188 lbs - -  BMI 123456 - -  Systolic [id number] 123456 -  Diastolic 82 67 -  Pulse [id number] - 96    BP 124/82   Pulse (!) 112   Ht '6\' 1"'$  (1.854 m)   Wt 188 lb (85.3 kg)   BMI 24.80 kg/m   Wt Readings from Last 3 Encounters:  01/16/21 188 lb (85.3 kg)   01/03/21 187 lb 12.8 oz (85.2 kg)  11/26/20 177 lb (80.3 kg)      CMP     Component Value Date/Time   NA 137 01/02/2021 0909   K 3.9 01/02/2021 0909   CL 96 01/02/2021 0909   CO2 24 01/02/2021 0909   GLUCOSE 224 (H) 01/02/2021 0909   GLUCOSE 154 (H) 11/03/2020 0600   BUN 17 01/02/2021  [id number]   CREATININE 1.01 01/02/2021 0909   CREATININE 0.67 07/18/2019 1422   CALCIUM 9.3 01/02/2021 0909   PROT 7.0 01/02/2021 0909   ALBUMIN 3.7 (L) 01/02/2021 0909   AST 8 01/02/2021 0909   ALT 8 01/02/2021 0909   ALKPHOS 88 01/02/2021 0909   BILITOT <0.2 01/02/2021 0909   GFRNONAA >60 11/03/2020 0600   GFRAA 125 06/05/2020 1046     Diabetic Labs (most recent): Lab Results  Component Value Date   HGBA1C 8.6 (H) 11/02/2020   HGBA1C 9.7 (A) 09/11/2020   HGBA1C 8.4 06/13/2020     Lipid Panel ( most recent) Lipid Panel     Component Value Date/Time   CHOL 140 06/05/2020 1046   TRIG 178 (H) 06/05/2020 1046   HDL 38 (L) 06/05/2020 1046   CHOLHDL 3.7 06/05/2020 1046   LDLCALC 72 06/05/2020 1046   LABVLDL 30 06/05/2020 1046      Lab Results  Component Value Date   TSH 1.303 11/02/2020   TSH 3.360 06/05/2020   TSH 1.111 12/26/2019   TSH 1.36 01/26/2019   TSH 1.78 10/13/2016   FREET4 0.98 11/02/2020   FREET4 1.06 06/05/2020   FREET4 0.86 01/26/2019      Assessment & Plan:   1. DM (diabetes mellitus) type 2, uncontrolled, with ketoacidosis (HCC)   - [name] has currently uncontrolled symptomatic type 2 DM since  45 years of age.  He returns with his meter and logs showing significantly above target glycemic profile.  His recent A1c was 8.6%.  He was recently initiated on prednisone by his pulmonologist which led to persistent hyperglycemia.  His A1c was 12.7% in March 2021.    - Recent labs reviewed. - I had a long discussion with him about the progressive nature of diabetes and the pathology behind its complications. -his diabetes is complicated by  peripheral arterial disease, chronic smoking, pancreatitis, and he remains at extremely high risk for more acute and chronic complications which include CAD, CVA, CKD, retinopathy, and neuropathy. These are all discussed in detail with him.  - I have counseled him on diet  and weight management  by adopting a carbohydrate restricted/protein rich diet. Patient is encouraged to switch to  unprocessed or minimally processed     complex starch and increased protein intake (animal or plant source), fruits, and vegetables. -  he is advised to stick to a routine mealtimes to eat 3 meals  a day and avoid unnecessary snacks ( to snack only to correct hypoglycemia).   - he acknowledges that there is a room for improvement in his food and drink choices. - Suggestion is made for him to avoid simple carbohydrates  from his diet including Cakes, Sweet Desserts, Ice Cream, Soda (diet and regular), Sweet Tea, Candies, Chips, Cookies, Store Bought Juices, Alcohol in Excess of  1-2 drinks a day, Artificial Sweeteners,  Coffee Creamer, and "Sugar-free" Products, Lemonade. This will help patient to have more stable blood glucose profile and potentially avoid unintended weight gain.   - he has been scheduled with Jearld Fenton, RDN, CDE for diabetes education.  - I have approached him with the following individualized plan to manage  his diabetes and patient agrees:   -In light of his presentation with chronically uncontrolled type 2 diabetes with recent hyperglycemia, he will continue to need intensive treatment with higher dose of basal/bolus insulin in order for him to achieve and maintain control of diabetes to target.    -His history is  suggestive of type 2 diabetes rather than type 1, will be considered for work-up for proper classification on subsequent visits.    -In the meantime, he is advised to increase Lantus to 50 units nightly, increase NovoLog to 14   units 3 times a day with meals  for pre-meal BG  readings of 90-'150mg'$ /dl, plus patient specific correction dose for unexpected hyperglycemia above '150mg'$ /dl, associated with strict monitoring of glucose 4 times a day-before meals and at bedtime. - he is warned not to take insulin without proper monitoring per orders. - Adjustment parameters are given to him for hypo and hyperglycemia in writing. - he is encouraged to call clinic for blood glucose levels less than 70 or above 300 mg /dl. -He will be treated exclusively with insulin at this time.     - he is not a candidate for SGLT2 inhibitors nor incretin therapy due to his body habitus and recent pancreatitis.   He remains a very heavy chronic smoker.   - Specific targets for  A1c;  LDL, HDL,  and Triglycerides were discussed with the patient.  2) Blood Pressure /Hypertension:  -His blood pressure is controlled to target.  He is advised to discontinue lisinopril and metoprolol.  Patient is symptomatic of lightheadedness at times.  He is advised to measure blood pressure at home and report if readings are greater than 140/90.     3) Lipids/Hyperlipidemia:   His previsit lipid panel showed improved LDL at 72, hypertriglyceridemia at 178.  He will continue to benefit from statin intervention.  He is advised to continue simvastatin 20 mg p.o. nightly.   -Side effects and precautions discussed with him.  4)  Weight/Diet:  Body mass index is 24.8 kg/m.  - he is not a candidate for weight loss.  He is losing weight unintentionally.  Exercise, and detailed carbohydrates information provided  -  detailed on discharge instructions. -This patient may need Creon intervention if he continues to lose weight given his history of pancreatitis.  5) Chronic Care/Health Maintenance:  -he  is on ACEI/ARB and Statin medications and  is encouraged to initiate and continue to follow up with Ophthalmology, Dentist,  Podiatrist at least yearly or according to recommendations, and advised to  quit smoking. I have  recommended yearly flu vaccine and pneumonia vaccine at least every 5 years; moderate intensity exercise for up to 150 minutes weekly; and  sleep for at least 7 hours a day.   The patient was counseled on the dangers of tobacco use, and was advised to quit.  Reviewed strategies to maximize success, including removing cigarettes and smoking materials from environment.   He was evaluated by vascular surgery, further work-up did not show significant peripheral arterial disease.   - he is  advised to maintain close follow up with Jani Gravel, MD for primary care needs, as well as his other providers for optimal and coordinated care.   I spent 31 minutes in the care of the patient today including review of labs from [facility], Lipids, Thyroid Function, Hematology (current and previous including abstractions from other facilities); face-to-face time discussing  his blood glucose readings/logs, discussing hypoglycemia and hyperglycemia episodes and symptoms, medications doses, his options of short and long term treatment based on the latest standards of care / guidelines;  discussion about incorporating lifestyle medicine;  and documenting the encounter.    Please refer to Patient Instructions for Blood Glucose Monitoring and Insulin/Medications Dosing Guide"  in media tab for additional information. Please  also refer to " Patient Self Inventory" in the Media  tab for reviewed elements of pertinent patient history.  [name] participated in the discussions, expressed understanding, and voiced agreement with the above plans.  All questions were answered to his satisfaction. he is encouraged to contact clinic should he have any questions or concerns prior to his return visit.  he have any questions or concerns prior to his return visit.    Follow up plan: - Return in about 5 weeks (around 02/20/2021) for F/U with Meter and Logs Only - no Labs.  Omar Lloyd, MD [facility] [address] [place], [place] [address] Phone: [phone]  Fax: [phone]    01/16/2021, 1:22 PM  This note was partially dictated with voice recognition software. Similar sounding words can be transcribed inadequately or may not  be corrected upon review.

## 2021-01-17 ENCOUNTER — Encounter (INDEPENDENT_AMBULATORY_CARE_PROVIDER_SITE_OTHER): Payer: Self-pay | Admitting: *Deleted

## 2021-01-21 ENCOUNTER — Encounter: Payer: Self-pay | Admitting: Pulmonary Disease

## 2021-01-21 ENCOUNTER — Other Ambulatory Visit: Payer: Self-pay

## 2021-01-21 ENCOUNTER — Ambulatory Visit (INDEPENDENT_AMBULATORY_CARE_PROVIDER_SITE_OTHER): Payer: Medicaid Other | Admitting: Pulmonary Disease

## 2021-01-21 VITALS — BP 130/84 | HR 104 | Temp 98.3°F | Ht 73.0 in | Wt 190.0 lb

## 2021-01-21 DIAGNOSIS — J849 Interstitial pulmonary disease, unspecified: Secondary | ICD-10-CM

## 2021-01-21 NOTE — Progress Notes (Signed)
[facility]  Chief Complaint  Patient presents with   Follow-up    Patient fells like breathing has got worse since last visit and since starting prednisone. Productive cough with clear sputum.     Constitutional:  BP 130/84 (BP Location: Left Arm, Patient Position: Sitting, Cuff Size: Normal)   Pulse (!) 104   Temp 98.3 F (36.8 C) (Oral)   Ht '6\' 1"'$  (1.854 m)   Wt 190 lb (86.2 kg)   SpO2 99%   BMI 25.07 kg/m   Past Medical History:  Bipolar, ED, HTN, GERD, Pneumonia, HLD, DM type 2  Past Surgical History:  He  has a past surgical history that includes Appendectomy; Knee surgery (Bilateral); Cholecystectomy (N/A, 07/11/2013); Video bronchoscopy (Bilateral, 02/09/2018); and Incision and drainage abscess / hematoma of bursa / knee / thigh.  Brief Summary:  [name] is a 45 y.o. 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      Subjective:   He has been on prednisone.  Still having cough and wheeze.  Worried about blood sugars.  Not having fever, skin rash, sinus congestion, or joint swelling.  He had colonoscopy and found to have few colon polyps.  Physical Exam:   Appearance - well kempt   ENMT - no sinus tenderness, no oral exudate, no LAN, Mallampati 3 airway, no stridor  Respiratory - b/l inspiratory and expiratory squeaks and wheezing  CV - s1s2 regular rate and rhythm, no murmurs  Ext - no clubbing, no edema  Skin - no rashes  Psych - normal mood and affect     Pulmonary testing:  Bronchoscopy 02/09/18 >> acute inflammation, 165 WBC (63% N, 2% L, 12% M, 23% E) IgE 02/16/18 >> 369 ACE 11/07/20 >> 34 PFT 11/26/20 >> FEV1 1.73 (44%), FEV1% 75, TLC 4.89 (64%), DLCO 36%  Chest Imaging:  CT chest 12/02/17 >> diffuse GGO with mild BTX, no honeycombing w/o change from 2014 HRCT chest 11/23/20 >> enlarged LN, bronchial wall thickening, mod/severe pulmonary fibrosis with extensive  peribronchovascular areas of traction BTX, honeycombing, interlobular septal thickening, and architectural distortion  Cardiac Tests:  Echo 11/02/20 >> EF 50 to 55%  Social History:  He  reports that he has been smoking cigarettes. He has a 2.50 pack-year smoking history. He has never used smokeless tobacco. He reports that he does not drink alcohol and does not use drugs.  Family History:  His family history includes CAD in his maternal grandmother; Diabetes in his maternal grandmother and mother.     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 - has significant progression compared to 2019 - continue prednisone 20 mg daily for now - continue symbicort - will repeat CBC with diff, IgE, and ANCA - will arrange for repeat high resolution CT chest in September 2022 - discussed option of referral to [facility] for second opinion and lung transplant assessment; he would like to see how he does with resuming prednisone first - he would like to defer 6MWT for now  CKD 2. - followed by Dr. Ulice Bold with [facility]  DM type 2. - follow up with his PCP to adjust regimen while he is on prednisone  Tobacco abuse. - he is using nicotine patch  Recreational marijuana use. - explained the concern about smoke inhalation of any kind in relation to his lung disease  Time Spent Involved in Patient Care on Day of Examination:  24 minutes  Follow up:   Patient Instructions  Lab tests today  Will arrange for CT chest in September  Continue prednisone 20 mg daily   Follow up in 2 months  Medication List:   Allergies as of 01/21/2021   No Known Allergies      Medication List        Accurate as of January 21, 2021 12:40 PM. If you have any questions, ask your nurse or doctor.          Accu-Chek FastClix Lancets Misc USE TO TEST BLOOD SUGAR FOUR TIMES DAILY.   acetaminophen 500 MG tablet Commonly known as: TYLENOL Take  500-1,000 mg by mouth See admin instructions. Take 1-2 tablets (500-1,000 mg) by mouth every 6 hours as needed for pain & take 2 tablets (1000 mg) by mouth SCHEDULED at bedtime.   albuterol 108 (90 Base) MCG/ACT inhaler Commonly known as: VENTOLIN HFA Inhale 2 puffs into the lungs every 6 (six) hours as needed. What changed: reasons to take this   aspirin 81 MG EC tablet Take 1 tablet (81 mg total) by mouth daily with breakfast.   chlorthalidone 25 MG tablet Commonly known as: HYGROTON Take 25 mg by mouth at bedtime.   divalproex 500 MG DR tablet Commonly known as: DEPAKOTE Take 2 tablets (1,000 mg total) by mouth at bedtime.   fenofibrate 145 MG tablet Commonly known as: TRICOR Take 145 mg by mouth at bedtime.   ferrous sulfate 325 (65 FE) MG tablet Take 325 mg by mouth daily with breakfast.   insulin glargine 100 UNIT/ML injection Commonly known as: Lantus Inject 0.5 mLs (50 Units total) into the skin at bedtime.   midodrine 5 MG tablet Commonly known as: PROAMATINE Take 1 tablet (5 mg total) by mouth 3 (three) times daily with meals.   multivitamin with minerals Tabs tablet Take 1 tablet by mouth in the morning. One-A-Day Men's Multivitamin   NovoLOG FlexPen 100 UNIT/ML FlexPen Generic drug: insulin aspart Inject 14-20 Units into the skin 3 (three) times daily with meals.   omeprazole 20 MG capsule Commonly known as: PRILOSEC Take 20 mg by mouth at bedtime.   OneTouch Verio test strip Generic drug: glucose blood 1 each by Other route 2 (two) times daily. And lancets 2/day   predniSONE 10 MG tablet Commonly known as: DELTASONE Take 2 tablets (20 mg total) by mouth daily with breakfast.   risperidone 4 MG tablet Commonly known as: RISPERDAL Take 4 mg by mouth at bedtime.   simvastatin 20 MG tablet Commonly known as: ZOCOR Take 20 mg by mouth at bedtime.   solifenacin 5 MG tablet Commonly known as: VESICARE Take 5 mg by mouth at bedtime.   Sure Comfort  Insulin Syringe 30G X 1/2" 1 ML Misc Generic drug: Insulin Syringe-Needle U-100 USE AS DIRECTED PER PROVIDER TO INJECT INSULIN DAILY.   BD Veo Insulin Syringe U/F 31G X 15/64" 1 ML Misc Generic drug: Insulin Syringe-Needle U-100 USE AS DIRECTED PER PROVIDER TO INJECT INSULIN DAILY.   Sure Comfort Pen Needles 31G X 8 MM Misc Generic drug: Insulin Pen Needle USE TO TEST 4 TIMES DAILY.   Symbicort 160-4.5 MCG/ACT inhaler Generic drug: budesonide-formoterol Inhale 2 puffs into the lungs 2 (two) times daily.        Signature:  Chesley Mires, MD [facility] Pager - [phone] 01/21/2021, 12:41 PM

## 2021-01-21 NOTE — Patient Instructions (Signed)
Lab tests today  Will arrange for CT chest in September  Continue prednisone 20 mg daily   Follow up in 2 months

## 2021-01-22 ENCOUNTER — Encounter (HOSPITAL_COMMUNITY): Payer: Self-pay | Admitting: Gastroenterology

## 2021-01-28 ENCOUNTER — Encounter (HOSPITAL_COMMUNITY)
Admission: RE | Disposition: A | Discharge status: Home / Self Care | Payer: Self-pay | Source: Home / Self Care | Attending: Gastroenterology

## 2021-01-28 ENCOUNTER — Ambulatory Visit (HOSPITAL_COMMUNITY)
Admission: RE | Admit: 2021-01-28 | Discharge: 2021-01-28 | Disposition: A | Discharge status: Home / Self Care | Payer: Medicaid Other | Attending: Gastroenterology | Admitting: Gastroenterology

## 2021-01-28 DIAGNOSIS — Z539 Procedure and treatment not carried out, unspecified reason: Secondary | ICD-10-CM | POA: Diagnosis not present

## 2021-01-28 DIAGNOSIS — D509 Iron deficiency anemia, unspecified: Secondary | ICD-10-CM | POA: Insufficient documentation

## 2021-01-28 SURGERY — IMAGING PROCEDURE, GI TRACT, INTRALUMINAL, VIA CAPSULE

## 2021-01-28 NOTE — OR Nursing (Signed)
Patient arrived for [facility] Capsule to be completed. While in pre-op, pt states "What all was I supposed to do for the procedure? They sent me paperwork but I didn't read the paperpwork until yesterday. It was different than what the lady had told me on the phone." After further inquiry, patient states he ate yesterday (01/27/2021) AT 5pm and drank sips of water this morning at 5am. Pt also stated his last dose of Aspirin and Iron were on 01/27/2021 in the morning. Dr. Jenetta Downer called, notified, and aware. Procedure cancelled. Informed patient he would have to reschedule this procedure. Pt verbalized understanding.

## 2021-02-01 ENCOUNTER — Other Ambulatory Visit: Payer: Self-pay | Admitting: "Endocrinology

## 2021-02-20 ENCOUNTER — Ambulatory Visit (HOSPITAL_COMMUNITY): Payer: Medicaid Other

## 2021-02-20 ENCOUNTER — Ambulatory Visit: Payer: Medicaid Other | Admitting: "Endocrinology

## 2021-02-28 ENCOUNTER — Other Ambulatory Visit: Payer: Self-pay | Admitting: Nurse Practitioner

## 2021-03-06 ENCOUNTER — Other Ambulatory Visit: Payer: Medicaid Other

## 2021-03-11 ENCOUNTER — Other Ambulatory Visit: Payer: Self-pay | Admitting: "Endocrinology

## 2021-03-12 ENCOUNTER — Other Ambulatory Visit: Payer: Self-pay | Admitting: "Endocrinology

## 2021-03-26 ENCOUNTER — Ambulatory Visit: Payer: Medicaid Other | Admitting: Pulmonary Disease

## 2021-04-08 ENCOUNTER — Other Ambulatory Visit: Payer: Self-pay | Admitting: Pulmonary Disease

## 2021-04-08 ENCOUNTER — Telehealth: Payer: Self-pay | Admitting: "Endocrinology

## 2021-04-08 DIAGNOSIS — E1110 Type 2 diabetes mellitus with ketoacidosis without coma: Secondary | ICD-10-CM

## 2021-04-08 MED ORDER — INSULIN GLARGINE 100 UNIT/ML ~~LOC~~ SOLN: 50.0000 [IU] | Freq: Every day | SUBCUTANEOUS | 0 refills | Status: DC

## 2021-04-08 MED ORDER — NOVOLOG FLEXPEN 100 UNIT/ML ~~LOC~~ SOPN: 14.0000 [IU] | PEN_INJECTOR | Freq: Three times a day (TID) | SUBCUTANEOUS | 0 refills | Status: DC

## 2021-04-08 MED ORDER — ACCU-CHEK GUIDE VI STRP: 1.0000 | ORAL_STRIP | Freq: Four times a day (QID) | 0 refills | Status: DC

## 2021-04-08 NOTE — Telephone Encounter (Signed)
Prescriptions sent

## 2021-04-08 NOTE — Addendum Note (Signed)
Addended by: Ellin Saba on: 04/08/2021 02:32 PM   Modules accepted: Orders

## 2021-04-08 NOTE — Telephone Encounter (Signed)
Pt made an appt for Nov 17 and is requesting these be filled. He also states dr nida has him testing 4x a day and his rx he says says to test 2x a day.

## 2021-04-18 ENCOUNTER — Encounter: Payer: Self-pay | Admitting: "Endocrinology

## 2021-04-18 ENCOUNTER — Other Ambulatory Visit: Payer: Self-pay

## 2021-04-18 ENCOUNTER — Ambulatory Visit (INDEPENDENT_AMBULATORY_CARE_PROVIDER_SITE_OTHER): Payer: Medicaid Other | Admitting: "Endocrinology

## 2021-04-18 VITALS — BP 149/95 | HR 115 | Ht 73.0 in | Wt 191.0 lb

## 2021-04-18 DIAGNOSIS — F17200 Nicotine dependence, unspecified, uncomplicated: Secondary | ICD-10-CM | POA: Diagnosis not present

## 2021-04-18 DIAGNOSIS — I10 Essential (primary) hypertension: Secondary | ICD-10-CM

## 2021-04-18 DIAGNOSIS — E782 Mixed hyperlipidemia: Secondary | ICD-10-CM

## 2021-04-18 DIAGNOSIS — E1110 Type 2 diabetes mellitus with ketoacidosis without coma: Secondary | ICD-10-CM | POA: Diagnosis not present

## 2021-04-18 DIAGNOSIS — E559 Vitamin D deficiency, unspecified: Secondary | ICD-10-CM | POA: Diagnosis not present

## 2021-04-18 LAB — POCT GLYCOSYLATED HEMOGLOBIN (HGB A1C): HbA1c POC (<> result, manual entry): 10.6 % (ref 4.0–5.6)

## 2021-04-18 NOTE — Progress Notes (Signed)
04/18/2021, 9:42 PM   Endocrinology follow-up note  Subjective:    Patient ID: [name], male    DOB: 11/06/1975.  [name] is being seen in follow-up after he was seen in consultation for management of currently uncontrolled symptomatic diabetes requested by  Omar Gravel, MD.   Past Medical History:  Diagnosis Date   Bipolar disorder [facility])    Chronic respiratory failure ([place])    Erectile dysfunction    Essential hypertension    GERD (gastroesophageal reflux disease)    History of pneumonia    Hyperlipidemia    Interstitial lung disease ([place])    Pulmonary eosinophilia   Obesity    Pulmonary eosinophilia ([place])    Sinus tachycardia    Tobacco abuse    Type 2 diabetes mellitus ([place])     Past Surgical History:  Procedure Laterality Date   APPENDECTOMY     BIOPSY  01/15/2021   Procedure: BIOPSY;  Surgeon: Harvel Quale, MD;  [facility]: [facility];  Service: Gastroenterology;;   CHOLECYSTECTOMY N/A 07/11/2013   Procedure: LAPAROSCOPIC CHOLECYSTECTOMY;  Surgeon: Jamesetta So, MD;  Location: [facility];  Service: General;  Laterality: N/A;   COLONOSCOPY WITH PROPOFOL N/A 01/15/2021   Procedure: COLONOSCOPY WITH PROPOFOL;  Surgeon: Harvel Quale, MD;  Location: [facility];  Service: Gastroenterology;  Laterality: N/A;  10:40   ESOPHAGOGASTRODUODENOSCOPY (EGD) WITH PROPOFOL N/A 01/15/2021   Procedure: ESOPHAGOGASTRODUODENOSCOPY (EGD) WITH PROPOFOL;  Surgeon: Harvel Quale, MD;  [facility];  Service: Gastroenterology;  Laterality: N/A;   INCISION AND DRAINAGE ABSCESS / HEMATOMA OF BURSA / KNEE / THIGH     KNEE SURGERY Bilateral    POLYPECTOMY  01/15/2021   Procedure: POLYPECTOMY;  Surgeon: Harvel Quale, MD;  [facility];  Service: Gastroenterology;;   VIDEO BRONCHOSCOPY Bilateral 02/09/2018   Procedure:  VIDEO BRONCHOSCOPY WITHOUT FLUORO;  Surgeon: Brand Males, MD;  Location: [facility];  Service: Cardiopulmonary;  Laterality: Bilateral;    Social History   Socioeconomic History   Marital status: Single    Spouse name: Not on file   Number of children: 0   Years of education: Not on file   Highest education level: Not on file  Occupational History   Not on file  Tobacco Use   Smoking status: Some Days    Packs/day: 0.25    Years: 10.00    Pack years: 2.50    Types: Cigarettes   Smokeless tobacco: Never   Tobacco comments:    smokes 1/2 pack per week 01/21/21 [facility]  Vaping Use   Vaping Use: Never used  Substance and Sexual Activity   Alcohol use: No   Drug use: No   Sexual activity: Never  Other Topics Concern   Not on file  Social History Narrative   Not on file   Social Determinants of Health   Financial Resource Strain: Not on file  Food Insecurity: Not on file  Transportation Needs: Not on file  Physical Activity: Not on file  Stress: Not on file  Social Connections: Not on file  Family History  Problem Relation Age of Onset   Diabetes Mother    Diabetes Maternal Grandmother    CAD Maternal Grandmother        heart attack in her 81s   Pancreatitis Neg Hx    Colon cancer Neg Hx     Outpatient Encounter Medications as of 04/18/2021  Medication Sig   Accu-Chek FastClix Lancets MISC USE TO TEST BLOOD SUGAR FOUR TIMES DAILY.   acetaminophen (TYLENOL) 500 MG tablet Take 500-1,000 mg by mouth See admin instructions. Take 1-2 tablets (500-1,000 mg) by mouth every 6 hours as needed for pain & take 2 tablets (1000 mg) by mouth SCHEDULED at bedtime.   albuterol (VENTOLIN HFA) 108 (90 Base) MCG/ACT inhaler Inhale 2 puffs into the lungs every 6 (six) hours as needed. (Patient taking differently: Inhale 2 puffs into the lungs every 6 (six) hours as needed for shortness of breath or wheezing.)   aspirin EC 81 MG EC tablet Take 1 tablet (81 mg total) by mouth daily  with breakfast.   BD VEO INSULIN SYRINGE U/F 31G X 15/64" 1 ML MISC USE AS DIRECTED PER PROVIDER TO INJECT INSULIN DAILY.   chlorthalidone (HYGROTON) 25 MG tablet Take 25 mg by mouth at bedtime.   divalproex (DEPAKOTE) 500 MG DR tablet Take 2 tablets (1,000 mg total) by mouth at bedtime.   fenofibrate (TRICOR) 145 MG tablet Take 145 mg by mouth at bedtime.   ferrous sulfate 325 (65 FE) MG tablet Take 325 mg by mouth daily with breakfast.   glucose blood (ACCU-CHEK GUIDE) test strip 1 each by Other route 4 (four) times daily. Use as instructed   insulin aspart (NOVOLOG FLEXPEN) 100 UNIT/ML FlexPen Inject 14-20 Units into the skin 3 (three) times daily with meals.   insulin glargine (LANTUS) 100 UNIT/ML injection Inject 0.5 mLs (50 Units total) into the skin daily.   Insulin Pen Needle (SURE COMFORT PEN NEEDLES) 31G X 8 MM MISC Use to inject insulin four times daily   Insulin Syringe-Needle U-100 (SURE COMFORT INSULIN SYRINGE) 30G X 1/2" 1 ML MISC USE AS DIRECTED PER PROVIDER TO INJECT INSULIN DAILY.   midodrine (PROAMATINE) 5 MG tablet Take 1 tablet (5 mg total) by mouth 3 (three) times daily with meals.   Multiple Vitamin (MULTIVITAMIN WITH MINERALS) TABS tablet Take 1 tablet by mouth in the morning. One-A-Day Men's Multivitamin   omeprazole (PRILOSEC) 20 MG capsule Take 20 mg by mouth at bedtime.   predniSONE (DELTASONE) 10 MG tablet TAKE 2 TABLETS BY MOUTH ONCE A DAY WITH BREAKFAST.   risperidone (RISPERDAL) 4 MG tablet Take 4 mg by mouth at bedtime.    simvastatin (ZOCOR) 20 MG tablet Take 20 mg by mouth at bedtime.   solifenacin (VESICARE) 5 MG tablet Take 5 mg by mouth at bedtime.    SYMBICORT 160-4.5 MCG/ACT inhaler Inhale 2 puffs into the lungs 2 (two) times daily.   No facility-administered encounter medications on file as of 04/18/2021.    ALLERGIES: No Known Allergies  VACCINATION STATUS: Immunization History  Administered Date(s) Administered   Influenza,inj,Quad PF,6+ Mos  06/06/2013   Moderna Sars-Covid-2 Vaccination 08/13/2019, 09/14/2019    Diabetes He presents for his follow-up diabetic visit. He has type 2 diabetes mellitus. Onset time: He was diagnosed at approximate age of 73 years. His disease course has been worsening (He weighed approximately 270 pounds during the time of his diagnosis.  He has chronic uncontrolled diabetes with previous episodes of impending diabetic ketoacidosis, hyperosmolar state. ).  There are no hypoglycemic associated symptoms. Pertinent negatives for hypoglycemia include no confusion, headaches, pallor or seizures. Associated symptoms include blurred vision, fatigue, polydipsia, polyuria and weight loss. Pertinent negatives for diabetes include no chest pain, no polyphagia and no weakness. (He recently had pancreatitis which caused unintended weight loss.) There are no hypoglycemic complications. Symptoms are worsening. Diabetic complications include peripheral neuropathy and PVD. Risk factors for coronary artery disease include diabetes mellitus, dyslipidemia, family history, male sex, hypertension, sedentary lifestyle and tobacco exposure. Current diabetic treatment includes insulin injections. His weight is increasing steadily. He is following a generally unhealthy diet. When asked about meal planning, he reported none. He has not had a previous visit with a dietitian. He never participates in exercise. His home blood glucose trend is increasing steadily. His breakfast blood glucose range is generally >200 mg/dl. His lunch blood glucose range is generally >200 mg/dl. His dinner blood glucose range is generally >200 mg/dl. His bedtime blood glucose range is generally >200 mg/dl. His overall blood glucose range is >200 mg/dl. (He returns with his meter and logs showing continued significant above target glycemic profile.  His A1c at point-of-care today is 10.6%, increasing from 8.6%.  He did not make much change in his lifestyle.  He misses at  least a third of his prandial insulin injections.    ) An ACE inhibitor/angiotensin II receptor blocker is being taken. Eye exam is current.  Hypertension This is a chronic problem. The problem is controlled. Associated symptoms include blurred vision. Pertinent negatives include no chest pain, headaches, neck pain, palpitations or shortness of breath. Risk factors for coronary artery disease include dyslipidemia, diabetes mellitus, male gender, sedentary lifestyle and smoking/tobacco exposure. Past treatments include ACE inhibitors. Hypertensive end-organ damage includes PVD.  Hyperlipidemia This is a chronic problem. The current episode started more than 1 year ago. The problem is uncontrolled. Exacerbating diseases include diabetes. Associated symptoms include myalgias. Pertinent negatives include no chest pain or shortness of breath. Current antihyperlipidemic treatment includes statins. Risk factors for coronary artery disease include diabetes mellitus, dyslipidemia, family history, male sex, hypertension and a sedentary lifestyle.    Review of Systems  Constitutional:  Positive for fatigue and weight loss. Negative for chills, fever and unexpected weight change.  HENT:  Negative for dental problem, mouth sores and trouble swallowing.   Eyes:  Positive for blurred vision. Negative for visual disturbance.  Respiratory:  Negative for cough, choking, chest tightness, shortness of breath and wheezing.   Cardiovascular:  Negative for chest pain, palpitations and leg swelling.  Gastrointestinal:  Negative for abdominal distention, abdominal pain, constipation, diarrhea, nausea and vomiting.  Endocrine: Positive for polydipsia and polyuria. Negative for polyphagia.  Genitourinary:  Negative for dysuria, flank pain, hematuria and urgency.  Musculoskeletal:  Positive for myalgias. Negative for back pain, gait problem and neck pain.  Skin:  Negative for pallor, rash and wound.  Neurological:  Negative  for seizures, syncope, weakness, numbness and headaches.  Psychiatric/Behavioral:  Negative for confusion and dysphoric mood.    Objective:    Vitals with BMI 04/18/2021 01/21/2021 01/16/2021  Height 6\' 1"  6\' 1"  6\' 1"   Weight 191 lbs 190 lbs 188 lbs  BMI 25.2 14.43 15.40  Systolic 086 761 950  Diastolic 95 84 82  Pulse 932 104 112    BP (!) 149/95   Pulse (!) 115   Ht 6\' 1"  (1.854 m)   Wt 191 lb (86.6 kg)   BMI 25.20 kg/m   Wt Readings from Last  3 Encounters:  04/18/21 191 lb (86.6 kg)  01/21/21 190 lb (86.2 kg)  01/16/21 188 lb (85.3 kg)      CMP     Component Value Date/Time   NA 137 01/02/2021 0909   K 3.9 01/02/2021 0909   CL 96 01/02/2021 0909   CO2 24 01/02/2021 0909   GLUCOSE 224 (H) 01/02/2021 0909   GLUCOSE 154 (H) 11/03/2020 0600   BUN 17 01/02/2021 0909   CREATININE 1.01 01/02/2021 0909   CREATININE 0.67 07/18/2019 1422   CALCIUM 9.3 01/02/2021 0909   PROT 7.0 01/02/2021 0909   ALBUMIN 3.7 (L) 01/02/2021 0909   AST 8 01/02/2021 0909   ALT 8 01/02/2021 0909   ALKPHOS 88 01/02/2021 0909   BILITOT <0.2 01/02/2021 0909   GFRNONAA >60 11/03/2020 0600   GFRAA 125 06/05/2020 1046     Diabetic Labs (most recent): Lab Results  Component Value Date   HGBA1C 10.6 04/18/2021   HGBA1C 8.6 (H) 11/02/2020   HGBA1C 9.7 (A) 09/11/2020     Lipid Panel ( most recent) Lipid Panel     Component Value Date/Time   CHOL 140 06/05/2020 1046   TRIG 178 (H) 06/05/2020 1046   HDL 38 (L) 06/05/2020 1046   CHOLHDL 3.7 06/05/2020 1046   LDLCALC 72 06/05/2020 1046   LABVLDL 30 06/05/2020 1046      Lab Results  Component Value Date   TSH 1.303 11/02/2020   TSH 3.360 06/05/2020   TSH 1.111 12/26/2019   TSH 1.36 01/26/2019   TSH 1.78 10/13/2016   FREET4 0.98 11/02/2020   FREET4 1.06 06/05/2020   FREET4 0.86 01/26/2019      Assessment & Plan:   1. DM (diabetes mellitus) type 2, uncontrolled, with ketoacidosis (HCC)   - KACEN MELLINGER has currently  uncontrolled symptomatic type 2 DM since  46 years of age.  He returns with his meter and logs showing continued significant above target glycemic profile.  His A1c at point-of-care today is 10.6%, increasing from 8.6%.  He did not make much change in his lifestyle.  He misses at least a third of his prandial insulin injections.     His A1c was 12.7% in March 2021.    - Recent labs reviewed. - I had a long discussion with him about the progressive nature of diabetes and the pathology behind its complications. -his diabetes is complicated by peripheral arterial disease, chronic smoking, pancreatitis, and he remains at extremely high risk  for more acute and chronic complications which include CAD, CVA, CKD, retinopathy, and neuropathy. These are all discussed in detail with him.  - I have counseled him on diet  and weight management  by adopting a carbohydrate restricted/protein rich diet. Patient is encouraged to switch to  unprocessed or minimally processed     complex starch and increased protein intake (animal or plant source), fruits, and vegetables. -  he is advised to stick to a routine mealtimes to eat 3 meals  a day and avoid unnecessary snacks ( to snack only to correct hypoglycemia).   - he acknowledges that there is a room for improvement in his food and drink choices. - Suggestion is made for him to avoid simple carbohydrates  from his diet including Cakes, Sweet Desserts, Ice Cream, Soda (diet and regular), Sweet Tea, Candies, Chips, Cookies, Store Bought Juices, Alcohol in Excess of  1-2 drinks a day, Artificial Sweeteners,  Coffee Creamer, and "Sugar-free" Products, Lemonade. This will help patient to have  more stable blood glucose profile and potentially avoid unintended weight gain.   - he has been scheduled with Jearld Fenton, RDN, CDE for diabetes education.  - I have approached him with the following individualized plan to manage  his diabetes and patient agrees:   -In light  of his presentation with chronically uncontrolled type 2 diabetes with recent hyperglycemia, he will continue to need intensive treatment with higher dose of basal/bolus insulin in order for him to achieve and maintain control of diabetes to target.    -His history is suggestive of type 2 diabetes rather than type 1, will be considered for work-up for proper classification on subsequent visits.    -In the meantime, he is advised to increase his Lantus to 60 units nightly, advised to increase NovoLog to 18    units 3 times a day with meals  for pre-meal BG readings of 90-150mg /dl, plus patient specific correction dose for unexpected hyperglycemia above 150mg /dl, associated with strict monitoring of glucose 4 times a day-before meals and at bedtime. - he is warned not to take insulin without proper monitoring per orders. - Adjustment parameters are given to him for hypo and hyperglycemia in writing. - he is encouraged to call clinic for blood glucose levels less than 70 or above 300 mg /dl. -He will be treated exclusively with insulin at this time.     - he is not a candidate for SGLT2 inhibitors nor incretin therapy due to his body habitus and recent pancreatitis.   He remains a very heavy chronic smoker.   - Specific targets for  A1c;  LDL, HDL,  and Triglycerides were discussed with the patient.  2) Blood Pressure /Hypertension:  -His blood pressure is controlled to target.  He is advised to discontinue lisinopril and metoprolol.  Patient is symptomatic of lightheadedness at times.  He is advised to measure blood pressure at home and report if readings are greater than 140/90.     3) Lipids/Hyperlipidemia:   His previsit lipid panel showed improved LDL at 72, hypertriglyceridemia at 178.  He will continue to benefit from statin intervention.  He is advised to continue pravastatin 20 mg p.o. nightly .  -Side effects and precautions discussed with him.  4)  Weight/Diet:  Body mass index is  25.2 kg/m.  - he is not a candidate for weight loss.  He is losing weight unintentionally.  Exercise, and detailed carbohydrates information provided  -  detailed on discharge instructions. -This patient may need Creon intervention if he continues to lose weight given his history of pancreatitis.  5) Chronic Care/Health Maintenance:  -he  is on ACEI/ARB and Statin medications and  is encouraged to initiate and continue to follow up with Ophthalmology, Dentist,  Podiatrist at least yearly or according to recommendations, and advised to  quit smoking. I have recommended yearly flu vaccine and pneumonia vaccine at least every 5 years; moderate intensity exercise for up to 150 minutes weekly; and  sleep for at least 7 hours a day.   The patient was counseled on the dangers of tobacco use, and was advised to quit.  Reviewed strategies to maximize success, including removing cigarettes and smoking materials from environment.   He was evaluated by vascular surgery, further work-up did not show significant peripheral arterial disease.   - he is  advised to maintain close follow up with Omar Gravel, MD for primary care needs, as well as his other providers for optimal and coordinated care.  I spent 41 minutes in the care of the patient today including review of labs from [facility], Lipids, Thyroid Function, Hematology (current and previous including abstractions from other facilities); face-to-face time discussing  his blood glucose readings/logs, discussing hypoglycemia and hyperglycemia episodes and symptoms, medications doses, his options of short and long term treatment based on the latest standards of care / guidelines;  discussion about incorporating lifestyle medicine;  and documenting the encounter.    Please refer to Patient Instructions for Blood Glucose Monitoring and Insulin/Medications Dosing Guide"  in media tab for additional information. Please  also refer to " Patient Self Inventory" in the  Media  tab for reviewed elements of pertinent patient history.  [name] participated in the discussions, expressed understanding, and voiced agreement with the above plans.  All questions were answered to his satisfaction. he is encouraged to contact clinic should he have any questions or concerns prior to his return visit.    Follow up plan: - Return in about 3 months (around 07/19/2021) for F/U with Pre-visit Labs, Meter, Logs, A1c here.Glade Lloyd, MD [facility] [address] [place], [place] [address] Phone: [phone]  Fax: [phone]    04/18/2021, 9:42 PM  This note was partially dictated with voice recognition software. Similar sounding words can be transcribed inadequately or may not  be corrected upon review.

## 2021-04-18 NOTE — Patient Instructions (Signed)

## 2021-04-26 ENCOUNTER — Other Ambulatory Visit: Payer: Self-pay

## 2021-04-26 ENCOUNTER — Ambulatory Visit (HOSPITAL_COMMUNITY)
Admission: RE | Admit: 2021-04-26 | Discharge: 2021-04-26 | Disposition: A | Discharge status: Home / Self Care | Payer: Medicaid Other | Source: Ambulatory Visit | Attending: Pulmonary Disease | Admitting: Pulmonary Disease

## 2021-04-26 DIAGNOSIS — J849 Interstitial pulmonary disease, unspecified: Secondary | ICD-10-CM | POA: Insufficient documentation

## 2021-05-01 ENCOUNTER — Other Ambulatory Visit: Payer: Self-pay | Admitting: "Endocrinology

## 2021-05-06 ENCOUNTER — Other Ambulatory Visit: Payer: Self-pay | Admitting: "Endocrinology

## 2021-05-06 DIAGNOSIS — E1110 Type 2 diabetes mellitus with ketoacidosis without coma: Secondary | ICD-10-CM

## 2021-05-09 ENCOUNTER — Other Ambulatory Visit: Payer: Self-pay | Admitting: Pulmonary Disease

## 2021-05-09 ENCOUNTER — Other Ambulatory Visit: Payer: Self-pay | Admitting: "Endocrinology

## 2021-05-09 DIAGNOSIS — E1110 Type 2 diabetes mellitus with ketoacidosis without coma: Secondary | ICD-10-CM

## 2021-05-24 ENCOUNTER — Ambulatory Visit (INDEPENDENT_AMBULATORY_CARE_PROVIDER_SITE_OTHER): Payer: Medicaid Other | Admitting: Pulmonary Disease

## 2021-05-24 ENCOUNTER — Encounter: Payer: Self-pay | Admitting: Pulmonary Disease

## 2021-05-24 ENCOUNTER — Other Ambulatory Visit: Payer: Self-pay

## 2021-05-24 VITALS — BP 128/78 | HR 82 | Temp 98.4°F | Ht 73.0 in | Wt 195.0 lb

## 2021-05-24 DIAGNOSIS — G4734 Idiopathic sleep related nonobstructive alveolar hypoventilation: Secondary | ICD-10-CM

## 2021-05-24 DIAGNOSIS — J8281 Chronic eosinophilic pneumonia: Secondary | ICD-10-CM | POA: Diagnosis not present

## 2021-05-24 DIAGNOSIS — F1210 Cannabis abuse, uncomplicated: Secondary | ICD-10-CM

## 2021-05-24 DIAGNOSIS — J849 Interstitial pulmonary disease, unspecified: Secondary | ICD-10-CM

## 2021-05-24 DIAGNOSIS — Z720 Tobacco use: Secondary | ICD-10-CM

## 2021-05-24 MED ORDER — PREDNISONE 10 MG PO TABS: ORAL_TABLET | ORAL | 0 refills | Status: AC

## 2021-05-24 NOTE — Patient Instructions (Signed)
Lab tests today  Change prednisone to 15 daily for 3 weeks, then 10 mg daily until your next appointment  Follow up in 3 months

## 2021-05-24 NOTE — Progress Notes (Signed)
[facility]  Chief Complaint  Patient presents with   Follow-up    Cough with clear mucus and sob are about the same since last ov     Constitutional:  BP 128/78    Pulse 82    Temp 98.4 F (36.9 C)    Ht 6\' 1"  (1.854 m)    Wt 195 lb 0.6 oz (88.5 kg)    SpO2 96%    BMI 25.73 kg/m   Past Medical History:  Bipolar, ED, HTN, GERD, Pneumonia, HLD, DM type 2  Past Surgical History:  He  has a past surgical history that includes Appendectomy; Knee surgery (Bilateral); Cholecystectomy (N/A, 07/11/2013); Video bronchoscopy (Bilateral, 02/09/2018); Incision and drainage abscess / hematoma of bursa / knee / thigh; Colonoscopy with propofol (N/A, 01/15/2021); Esophagogastroduodenoscopy (egd) with propofol (N/A, 01/15/2021); biopsy (01/15/2021); and polypectomy (01/15/2021).  Brief Summary:  [name] is a 45 y.o. 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      Subjective:   He had CT chest in November.  Showed stable changes of advanced ILD.  He is still smoking 4 to 5 cigarettes and 1 or 2 blunts per day.  He plans to quit before the beginning of the new year.  He is using 2 liters oxygen at night.  He doesn't remember having discussion with nephrology about getting renal biopsy.  He has cough with clear sputum.  Gets winded easily.  Using 20 mg prednisone daily.  Not sure how much this has helped.  Physical Exam:   Appearance - well kempt, smells of smoke  ENMT - no sinus tenderness, no oral exudate, no LAN, Mallampati 3 airway, no stridor  Respiratory - b/l crackles and squeaks  CV - s1s2 regular rate and rhythm, no murmurs  Ext - no clubbing, no edema  Skin - no rashes  Psych - normal mood and affect     Pulmonary testing:  Bronchoscopy 02/09/18 >> acute inflammation, 165 WBC (63% N, 2% L, 12% M, 23% E) IgE 02/16/18 >> 369 ACE 11/07/20 >> 34 PFT 11/26/20 >> FEV1 1.73 (44%), FEV1% 75, TLC 4.89  (64%), DLCO 36%  Chest Imaging:  CT chest 12/02/17 >> diffuse GGO with mild BTX, no honeycombing w/o change from 2014 HRCT chest 11/23/20 >> enlarged LN, bronchial wall thickening, mod/severe pulmonary fibrosis with extensive peribronchovascular areas of traction BTX, honeycombing, interlobular septal thickening, and architectural distortion HRCT chest 04/26/21 >> stable findings compared to June 2022  Cardiac Tests:  Echo 11/02/20 >> EF 50 to 55%  Social History:  He  reports that he has been smoking cigarettes. He has a 2.50 pack-year smoking history. He has never used smokeless tobacco. He reports that he does not drink alcohol and does not use drugs.  Family History:  His family history includes CAD in his maternal grandmother; Diabetes in his maternal grandmother and mother.     Discussion:  He was originally diagnosed with eosinophilic pneumonia in [birth year]65.  He was lost to follow up until June 2022.  His CT imaging and PFT are consistent with advanced ILD.  He has been re-challenged with prednisone w/o noticeable improvement.  He continues to smoke tobacco and marijuana cigarettes.  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 - he wasn't able to get lab testing at last visit; will reorder CBC with diff, ANA, ANCA, IgE - change prednisone to 15 mg daily for 3 weeks, then 10 mg daily  until his next visit - defer discussion about referral for lung transplant assessment until he is off cigarettes and marijuana - continue symbicort  Nocturnal hypoxemia. - uses 2 liters oxygen at night - uses [place] for his DME  CKD 2. - followed by Dr. Ulice Bold with [facility]  DM type 2. - follow up with his PCP to adjust regimen while he is on prednisone  Tobacco abuse. - he is using nicotine patch  Recreational marijuana use. - explained the concern about smoke inhalation of any kind in relation to his lung  disease  Time Spent Involved in Patient Care on Day of Examination:  37 minutes  Follow up:   Patient Instructions  Lab tests today  Change prednisone to 15 daily for 3 weeks, then 10 mg daily until your next appointment  Follow up in 3 months  Medication List:   Allergies as of 05/24/2021   No Known Allergies      Medication List        Accurate as of May 24, 2021 11:32 AM. If you have any questions, ask your nurse or doctor.          Accu-Chek FastClix Lancets Misc USE TO TEST BLOOD SUGAR FOUR TIMES DAILY.   Accu-Chek Guide test strip Generic drug: glucose blood USE TO CHECK BLOOD SUGAR FOUR TIMES DAILY   acetaminophen 500 MG tablet Commonly known as: TYLENOL Take 500-1,000 mg by mouth See admin instructions. Take 1-2 tablets (500-1,000 mg) by mouth every 6 hours as needed for pain & take 2 tablets (1000 mg) by mouth SCHEDULED at bedtime.   albuterol 108 (90 Base) MCG/ACT inhaler Commonly known as: VENTOLIN HFA Inhale 2 puffs into the lungs every 6 (six) hours as needed. What changed: reasons to take this   aspirin 81 MG EC tablet Take 1 tablet (81 mg total) by mouth daily with breakfast.   chlorthalidone 25 MG tablet Commonly known as: HYGROTON Take 25 mg by mouth at bedtime.   divalproex 500 MG DR tablet Commonly known as: DEPAKOTE Take 2 tablets (1,000 mg total) by mouth at bedtime.   fenofibrate 145 MG tablet Commonly known as: TRICOR Take 145 mg by mouth at bedtime.   ferrous sulfate 325 (65 FE) MG tablet Take 325 mg by mouth daily with breakfast.   Lantus SoloStar 100 UNIT/ML Solostar Pen Generic drug: insulin glargine Inject 60 Units into the skin at bedtime.   midodrine 5 MG tablet Commonly known as: PROAMATINE Take 1 tablet (5 mg total) by mouth 3 (three) times daily with meals.   multivitamin with minerals Tabs tablet Take 1 tablet by mouth in the morning. One-A-Day Men's Multivitamin   NovoLOG FlexPen 100 UNIT/ML  FlexPen Generic drug: insulin aspart Inject 18 Units into the skin 3 (three) times daily with meals.   omeprazole 20 MG capsule Commonly known as: PRILOSEC Take 20 mg by mouth at bedtime.   predniSONE 10 MG tablet Commonly known as: DELTASONE Take 1.5 tablets (15 mg total) by mouth daily with breakfast for 21 days, THEN 1 tablet (10 mg total) daily with breakfast. Start taking on: May 24, 2021 What changed: See the new instructions. Changed by: Chesley Mires, MD   risperidone 4 MG tablet Commonly known as: RISPERDAL Take 4 mg by mouth at bedtime.   simvastatin 20 MG tablet Commonly known as: ZOCOR Take 20 mg by mouth at bedtime.   solifenacin 5 MG tablet Commonly known as: VESICARE Take 5 mg by mouth  at bedtime.   Sure Comfort Insulin Syringe 30G X 1/2" 1 ML Misc Generic drug: Insulin Syringe-Needle U-100 USE AS DIRECTED PER PROVIDER TO INJECT INSULIN DAILY.   BD Veo Insulin Syringe U/F 31G X 15/64" 1 ML Misc Generic drug: Insulin Syringe-Needle U-100 USE AS DIRECTED PER PROVIDER TO INJECT INSULIN DAILY.   Sure Comfort Pen Needles 31G X 8 MM Misc Generic drug: Insulin Pen Needle USE TO INJECT INSULIN FOUR TIMES DAILY. **NEED APPOINTMENT FOR FUTURE REFILLS PER MD**   Symbicort 160-4.5 MCG/ACT inhaler Generic drug: budesonide-formoterol Inhale 2 puffs into the lungs 2 (two) times daily.        Signature:  Chesley Mires, MD [place] Pager - [phone] 05/24/2021, 11:32 AM

## 2021-05-30 LAB — CBC WITH DIFFERENTIAL/PLATELET
Basophils Absolute: 0 10*3/uL (ref 0.0–0.2)
Basos: 0 %
EOS (ABSOLUTE): 0.1 10*3/uL (ref 0.0–0.4)
Eos: 1 %
Hematocrit: 41 % (ref 37.5–51.0)
Hemoglobin: 13.5 g/dL (ref 13.0–17.7)
Immature Grans (Abs): 0.1 10*3/uL (ref 0.0–0.1)
Immature Granulocytes: 1 %
Lymphocytes Absolute: 2 10*3/uL (ref 0.7–3.1)
Lymphs: 17 %
MCH: 28.7 pg (ref 26.6–33.0)
MCHC: 32.9 g/dL (ref 31.5–35.7)
MCV: 87 fL (ref 79–97)
Monocytes Absolute: 0.8 10*3/uL (ref 0.1–0.9)
Monocytes: 7 %
Neutrophils Absolute: 8.9 10*3/uL — ABNORMAL HIGH (ref 1.4–7.0)
Neutrophils: 74 %
Platelets: 282 10*3/uL (ref 150–450)
RBC: 4.7 x10E6/uL (ref 4.14–5.80)
RDW: 13.7 % (ref 11.6–15.4)
WBC: 11.8 10*3/uL — ABNORMAL HIGH (ref 3.4–10.8)

## 2021-05-30 LAB — ANCA PROFILE
Anti-MPO Antibodies: <0.2 units (ref 0.0–0.9)
Anti-PR3 Antibodies: <0.2 units (ref 0.0–0.9)
Atypical pANCA: <1:20 {titer}
C-ANCA: <1:20 {titer}
P-ANCA: <1:20 {titer}

## 2021-05-30 LAB — IGE: IgE (Immunoglobulin E), Serum: 43 IU/mL (ref 6–495)

## 2021-05-30 LAB — ANA W/REFLEX: ANA Titer 1: NEGATIVE

## 2021-06-07 ENCOUNTER — Other Ambulatory Visit: Payer: Self-pay | Admitting: "Endocrinology

## 2021-06-07 DIAGNOSIS — E1110 Type 2 diabetes mellitus with ketoacidosis without coma: Secondary | ICD-10-CM

## 2021-06-20 ENCOUNTER — Telehealth: Payer: Self-pay | Admitting: Pulmonary Disease

## 2021-06-20 NOTE — Telephone Encounter (Signed)
Primary Pulmonologist: Dr. Halford Chessman  Last office visit and with whom: 05-24-2021 Dr.Sood  What do we see them for (pulmonary problems): ILD Last OV assessment/plan: see below   Was appointment offered to patient (explain)?  No patient believes he may be covid +   Reason for call:  Patient states starting about 2 days ago he lost his appetite and hasnt been able to eat much or keep food down. Patient is diabetic. Has had a constant dry cough, lightheaded, has a fever of 100 oral. Wheezing. Uses 2LO2 oxygen at home and has not had to increase his O2 since being sick. Does not have a covid test at home but believes he may be covid positive.   Will send to DOD since Dr. Halford Chessman is off.   Dr. Chase Caller please advise     No Known Allergies  Immunization History  Administered Date(s) Administered   Influenza,inj,Quad PF,6+ Mos 06/06/2013, 05/01/2021   Moderna Sars-Covid-2 Vaccination 08/13/2019, 09/14/2019    Assessment/Plan:    Interstitial lung disease with history of eosinophilic pneumonia. - he wasn't able to get lab testing at last visit; will reorder CBC with diff, ANA, ANCA, IgE - change prednisone to 15 mg daily for 3 weeks, then 10 mg daily until his next visit - defer discussion about referral for lung transplant assessment until he is off cigarettes and marijuana - continue symbicort   Nocturnal hypoxemia. - uses 2 liters oxygen at night - uses [place] for his DME   CKD 2. - followed by Dr. Ulice Bold with [facility]   DM type 2. - follow up with his PCP to adjust regimen while he is on prednisone   Tobacco abuse. - he is using nicotine patch   Recreational marijuana use. - explained the concern about smoke inhalation of any kind in relation to his lung disease

## 2021-06-20 NOTE — Telephone Encounter (Signed)
I called and spoke with the pt  He is aware of response per MR  He states will get at home test and call tomorrow with results  Aware to seek emergent care if unable to hold down fluids Will hold in triage to check on him if he has not called [place] by tomorrow pm

## 2021-06-20 NOTE — Telephone Encounter (Signed)
Fever dry cough lightheadedness loss of appetite.  This could be COVID.  It could be flu it could be other respiratory viruses we do not know.  He has significant medical problems including chronic kidney disease though August 2022 creatinine was normal.  And also diabetes  Plan - He needs to go get a COVID test if the COVID is positive then we can treatwith  antiviral -Otherwise for dehydration he has to drink enough fluids and if he cannot keep the fluids down he has to go to the ER - The symptoms can put him at risk for DKA and for this electric contact his endocrinologist or primary care physician.

## 2021-06-21 NOTE — Telephone Encounter (Signed)
Pt called this morning and had negative covid test.  States fever has gone down.  Requesting something to be called in.  [phone].  [employer].  Routing to Rexene Edison NP since she is DOD and Dr. Halford Chessman is still off.   Please advise.

## 2021-06-21 NOTE — Telephone Encounter (Signed)
Pt called this morning and had negative covid test.  States fever has gone down.  Requesting something to be called in.  [phone].  [employer].

## 2021-06-21 NOTE — Telephone Encounter (Signed)
Sorry to hear he is sick, cough, fever.  Glad fever is coming down, sounds possible viral .  Agree with tylenol and fluids  What does he want called in to pharmacy?  What is his oxygen levels on O2 .  I can see in office with flu swab to eval him and decide on additional rx.  Can double book my schedule at 2 if needed   Please contact office for sooner follow up if symptoms do not improve or worsen or seek emergency care

## 2021-06-21 NOTE — Telephone Encounter (Signed)
Called and spoke with patient about his symptoms. Patient states that he has no fever, he has a very light appitite and is staying with fluids and tylenol. He denied a to be seen in office today he state she is going to call his primary care. He states he took a covid test at home and it was negative. He is currently o 2L [place]. Nothing further at this time

## 2021-07-18 LAB — LIPID PANEL
Chol/HDL Ratio: 3.1 ratio (ref 0.0–5.0)
Cholesterol, Total: 113 mg/dL (ref 100–199)
HDL: 37 mg/dL — ABNORMAL LOW (ref >39)
LDL Chol Calc (NIH): 49 mg/dL (ref 0–99)
Triglycerides: 156 mg/dL — ABNORMAL HIGH (ref 0–149)
VLDL Cholesterol Cal: 27 mg/dL (ref 5–40)

## 2021-07-18 LAB — COMPREHENSIVE METABOLIC PANEL
ALT: 8 IU/L (ref 0–44)
AST: 12 IU/L (ref 0–40)
Albumin/Globulin Ratio: 0.8 — ABNORMAL LOW (ref 1.2–2.2)
Albumin: 3.8 g/dL — ABNORMAL LOW (ref 4.0–5.0)
Alkaline Phosphatase: 84 IU/L (ref 44–121)
BUN/Creatinine Ratio: 14 (ref 9–20)
BUN: 18 mg/dL (ref 6–24)
Bilirubin Total: 0.2 mg/dL (ref 0.0–1.2)
CO2: 25 mmol/L (ref 20–29)
Calcium: 9.6 mg/dL (ref 8.7–10.2)
Chloride: 97 mmol/L (ref 96–106)
Creatinine, Ser: 1.29 mg/dL — ABNORMAL HIGH (ref 0.76–1.27)
Globulin, Total: 4.7 g/dL — ABNORMAL HIGH (ref 1.5–4.5)
Glucose: 207 mg/dL — ABNORMAL HIGH (ref 70–99)
Potassium: 4.5 mmol/L (ref 3.5–5.2)
Sodium: 135 mmol/L (ref 134–144)
Total Protein: 8.5 g/dL (ref 6.0–8.5)
eGFR: 70 mL/min/{1.73_m2} (ref >59)

## 2021-07-19 ENCOUNTER — Other Ambulatory Visit: Payer: Self-pay | Admitting: "Endocrinology

## 2021-07-19 DIAGNOSIS — E1110 Type 2 diabetes mellitus with ketoacidosis without coma: Secondary | ICD-10-CM

## 2021-07-22 ENCOUNTER — Other Ambulatory Visit: Payer: Self-pay

## 2021-07-22 ENCOUNTER — Encounter: Payer: Self-pay | Admitting: "Endocrinology

## 2021-07-22 ENCOUNTER — Ambulatory Visit (INDEPENDENT_AMBULATORY_CARE_PROVIDER_SITE_OTHER): Payer: Medicaid Other | Admitting: "Endocrinology

## 2021-07-22 VITALS — BP 120/72 | HR 96 | Ht 73.0 in | Wt 190.2 lb

## 2021-07-22 DIAGNOSIS — E559 Vitamin D deficiency, unspecified: Secondary | ICD-10-CM | POA: Diagnosis not present

## 2021-07-22 DIAGNOSIS — E1110 Type 2 diabetes mellitus with ketoacidosis without coma: Secondary | ICD-10-CM | POA: Diagnosis not present

## 2021-07-22 DIAGNOSIS — E782 Mixed hyperlipidemia: Secondary | ICD-10-CM

## 2021-07-22 DIAGNOSIS — I10 Essential (primary) hypertension: Secondary | ICD-10-CM | POA: Diagnosis not present

## 2021-07-22 DIAGNOSIS — F17200 Nicotine dependence, unspecified, uncomplicated: Secondary | ICD-10-CM | POA: Diagnosis not present

## 2021-07-22 LAB — POCT GLYCOSYLATED HEMOGLOBIN (HGB A1C): HbA1c, POC (controlled diabetic range): 9 % — AB (ref 0.0–7.0)

## 2021-07-22 NOTE — Progress Notes (Signed)
07/22/2021, 1:16 PM   Endocrinology follow-up note  Subjective:    Patient ID: [name], male    DOB: January 02, 1976.  [name] is being seen in follow-up after he was seen in consultation for management of currently uncontrolled symptomatic diabetes requested by  Jani Gravel, MD.   Past Medical History:  Diagnosis Date   Bipolar disorder [facility])    Chronic respiratory failure ([place])    Erectile dysfunction    Essential hypertension    GERD (gastroesophageal reflux disease)    History of pneumonia    Hyperlipidemia    Interstitial lung disease ([place])    Pulmonary eosinophilia   Obesity    Pulmonary eosinophilia ([place])    Sinus tachycardia    Tobacco abuse    Type 2 diabetes mellitus ([place])     Past Surgical History:  Procedure Laterality Date   APPENDECTOMY     BIOPSY  01/15/2021   Procedure: BIOPSY;  Surgeon: Harvel Quale, MD;  [facility];  Service: Gastroenterology;;   CHOLECYSTECTOMY N/A 07/11/2013   Procedure: LAPAROSCOPIC CHOLECYSTECTOMY;  Surgeon: Jamesetta So, MD;  Location: [facility];  Service: General;  Laterality: N/A;   COLONOSCOPY WITH PROPOFOL N/A 01/15/2021   Procedure: COLONOSCOPY WITH PROPOFOL;  Surgeon: Harvel Quale, MD;  Location[facility];  Service: Gastroenterology;  Laterality: N/A;  10:40   ESOPHAGOGASTRODUODENOSCOPY (EGD) WITH PROPOFOL N/A 01/15/2021   Procedure: ESOPHAGOGASTRODUODENOSCOPY (EGD) WITH PROPOFOL;  Surgeon: Harvel Quale, MD;  [facility];  Service: Gastroenterology;  Laterality: N/A;   INCISION AND DRAINAGE ABSCESS / HEMATOMA OF BURSA / KNEE / THIGH     KNEE SURGERY Bilateral    POLYPECTOMY  01/15/2021   Procedure: POLYPECTOMY;  Surgeon: Harvel Quale, MD;  [facility];  Service: Gastroenterology;;   VIDEO BRONCHOSCOPY Bilateral 02/09/2018   Procedure:  VIDEO BRONCHOSCOPY WITHOUT FLUORO;  Surgeon: Brand Males, MD;  Location: [facility];  Service: Cardiopulmonary;  Laterality: Bilateral;    Social History   Socioeconomic History   Marital status: Single    Spouse name: Not on file   Number of children: 0   Years of education: Not on file   Highest education level: Not on file  Occupational History   Not on file  Tobacco Use   Smoking status: Some Days    Packs/day: 0.25    Years: 10.00    Pack years: 2.50    Types: Cigarettes   Smokeless tobacco: Never   Tobacco comments:    smokes 1/2 pack per week 01/21/21 [facility]  Vaping Use   Vaping Use: Never used  Substance and Sexual Activity   Alcohol use: No   Drug use: No   Sexual activity: Never  Other Topics Concern   Not on file  Social History Narrative   Not on file   Social Determinants of Health   Financial Resource Strain: Not on file  Food Insecurity: Not on file  Transportation Needs: Not on file  Physical Activity: Not on file  Stress: Not on file  Social Connections: Not on file  Family History  Problem Relation Age of Onset   Diabetes Mother    Diabetes Maternal Grandmother    CAD Maternal Grandmother        heart attack in her 82s   Pancreatitis Neg Hx    Colon cancer Neg Hx     Outpatient Encounter Medications as of 07/22/2021  Medication Sig   Accu-Chek FastClix Lancets MISC USE TO TEST BLOOD SUGAR FOUR TIMES DAILY.   ACCU-CHEK GUIDE test strip USE TO CHECK BLOOD SUGAR FOUR TIMES DAILY   acetaminophen (TYLENOL) 500 MG tablet Take 500-1,000 mg by mouth See admin instructions. Take 1-2 tablets (500-1,000 mg) by mouth every 6 hours as needed for pain & take 2 tablets (1000 mg) by mouth SCHEDULED at bedtime.   albuterol (VENTOLIN HFA) 108 (90 Base) MCG/ACT inhaler Inhale 2 puffs into the lungs every 6 (six) hours as needed. (Patient taking differently: Inhale 2 puffs into the lungs every 6 (six) hours as needed for shortness of breath or wheezing.)    aspirin EC 81 MG EC tablet Take 1 tablet (81 mg total) by mouth daily with breakfast.   BD VEO INSULIN SYRINGE U/F 31G X 15/64" 1 ML MISC USE AS DIRECTED PER PROVIDER TO INJECT INSULIN DAILY.   chlorthalidone (HYGROTON) 25 MG tablet Take 25 mg by mouth at bedtime.   divalproex (DEPAKOTE) 500 MG DR tablet Take 2 tablets (1,000 mg total) by mouth at bedtime.   fenofibrate (TRICOR) 145 MG tablet Take 145 mg by mouth at bedtime.   ferrous sulfate 325 (65 FE) MG tablet Take 325 mg by mouth daily with breakfast.   Insulin Pen Needle (SURE COMFORT PEN NEEDLES) 31G X 8 MM MISC USE TO INJECT INSULIN FOUR TIMES DAILY. **NEED APPOINTMENT FOR FUTURE REFILLS PER MD**   Insulin Syringe-Needle U-100 (SURE COMFORT INSULIN SYRINGE) 30G X 1/2" 1 ML MISC USE AS DIRECTED PER PROVIDER TO INJECT INSULIN DAILY.   LANTUS SOLOSTAR 100 UNIT/ML Solostar Pen INJECT 60 UNITS INTO THE SKIN AT BEDTIME.   midodrine (PROAMATINE) 5 MG tablet Take 1 tablet (5 mg total) by mouth 3 (three) times daily with meals.   Multiple Vitamin (MULTIVITAMIN WITH MINERALS) TABS tablet Take 1 tablet by mouth in the morning. One-A-Day Men's Multivitamin   NOVOLOG FLEXPEN 100 UNIT/ML FlexPen INJECT 18 UNITS INTO THE SKIN THREE TIMES DAILY WITH MEALS   omeprazole (PRILOSEC) 20 MG capsule Take 20 mg by mouth at bedtime.   predniSONE (DELTASONE) 10 MG tablet Take 1.5 tablets (15 mg total) by mouth daily with breakfast for 21 days, THEN 1 tablet (10 mg total) daily with breakfast.   risperidone (RISPERDAL) 4 MG tablet Take 4 mg by mouth at bedtime.    simvastatin (ZOCOR) 20 MG tablet Take 20 mg by mouth at bedtime.   solifenacin (VESICARE) 5 MG tablet Take 5 mg by mouth at bedtime.    SYMBICORT 160-4.5 MCG/ACT inhaler Inhale 2 puffs into the lungs 2 (two) times daily.   No facility-administered encounter medications on file as of 07/22/2021.    ALLERGIES: No Known Allergies  VACCINATION STATUS: Immunization History  Administered Date(s)  Administered   Influenza,inj,Quad PF,6+ Mos 06/06/2013, 05/01/2021   Moderna Sars-Covid-2 Vaccination 08/13/2019, 09/14/2019    Diabetes He presents for his follow-up diabetic visit. He has type 2 diabetes mellitus. Onset time: He was diagnosed at approximate age of 46 years. His disease course has been improving (He weighed approximately 270 pounds during the time of his diagnosis.  He has chronic uncontrolled  diabetes with previous episodes of impending diabetic ketoacidosis, hyperosmolar state. ). There are no hypoglycemic associated symptoms. Pertinent negatives for hypoglycemia include no confusion, headaches, pallor or seizures. Associated symptoms include blurred vision, fatigue, polydipsia, polyuria and weight loss. Pertinent negatives for diabetes include no chest pain, no polyphagia and no weakness. (He recently had pancreatitis which caused unintended weight loss.) There are no hypoglycemic complications. Symptoms are worsening. Diabetic complications include peripheral neuropathy and PVD. Risk factors for coronary artery disease include diabetes mellitus, dyslipidemia, family history, male sex, hypertension, sedentary lifestyle and tobacco exposure. Current diabetic treatment includes insulin injections. His weight is increasing steadily. He is following a generally unhealthy diet. When asked about meal planning, he reported none. He has not had a previous visit with a dietitian. He never participates in exercise. His home blood glucose trend is decreasing steadily. His breakfast blood glucose range is generally 180-200 mg/dl. His lunch blood glucose range is generally >200 mg/dl. His dinner blood glucose range is generally >200 mg/dl. His bedtime blood glucose range is generally >200 mg/dl. His overall blood glucose range is >200 mg/dl. ( Mr. Tucholski returns with his logs showing improvement in his glycemic profile.  His A1c is 9%, improving from 10.6%.  His average blood glucose is between  180-220 mcg.   No major sustained hypoglycemia documented.  ) An ACE inhibitor/angiotensin II receptor blocker is being taken. Eye exam is current.  Hypertension This is a chronic problem. The problem is controlled. Associated symptoms include blurred vision. Pertinent negatives include no chest pain, headaches, neck pain, palpitations or shortness of breath. Risk factors for coronary artery disease include dyslipidemia, diabetes mellitus, male gender, sedentary lifestyle and smoking/tobacco exposure. Past treatments include ACE inhibitors. Hypertensive end-organ damage includes PVD.  Hyperlipidemia This is a chronic problem. The current episode started more than 1 year ago. The problem is uncontrolled. Exacerbating diseases include diabetes. Associated symptoms include myalgias. Pertinent negatives include no chest pain or shortness of breath. Current antihyperlipidemic treatment includes statins. Risk factors for coronary artery disease include diabetes mellitus, dyslipidemia, family history, male sex, hypertension and a sedentary lifestyle.    Review of Systems  Constitutional:  Positive for fatigue and weight loss. Negative for chills, fever and unexpected weight change.  HENT:  Negative for dental problem, mouth sores and trouble swallowing.   Eyes:  Positive for blurred vision. Negative for visual disturbance.  Respiratory:  Negative for cough, choking, chest tightness, shortness of breath and wheezing.   Cardiovascular:  Negative for chest pain, palpitations and leg swelling.  Gastrointestinal:  Negative for abdominal distention, abdominal pain, constipation, diarrhea, nausea and vomiting.  Endocrine: Positive for polydipsia and polyuria. Negative for polyphagia.  Genitourinary:  Negative for dysuria, flank pain, hematuria and urgency.  Musculoskeletal:  Positive for myalgias. Negative for back pain, gait problem and neck pain.  Skin:  Negative for pallor, rash and wound.  Neurological:   Negative for seizures, syncope, weakness, numbness and headaches.  Psychiatric/Behavioral:  Negative for confusion and dysphoric mood.    Objective:    Vitals with BMI 07/22/2021 05/24/2021 04/18/2021  Height 6\' 1"  6\' 1"  6\' 1"   Weight 190 lbs 3 oz 195 lbs 1 oz 191 lbs  BMI 25.1 67.12 45.8  Systolic 099 833 825  Diastolic 72 78 95  Pulse 96 82 115    BP 120/72    Pulse 96    Ht 6\' 1"  (1.854 m)    Wt 190 lb 3.2 oz (86.3 kg)    BMI 25.09  kg/m   Wt Readings from Last 3 Encounters:  07/22/21 190 lb 3.2 oz (86.3 kg)  05/24/21 195 lb 0.6 oz (88.5 kg)  04/18/21 191 lb (86.6 kg)      CMP     Component Value Date/Time   NA 135 07/17/2021 1026   K 4.5 07/17/2021 1026   CL 97 07/17/2021 1026   CO2 25 07/17/2021 1026   GLUCOSE 207 (H) 07/17/2021 1026   GLUCOSE 154 (H) 11/03/2020 0600   BUN 18 07/17/2021 1026   CREATININE 1.29 (H) 07/17/2021 1026   CREATININE 0.67 07/18/2019 1422   CALCIUM 9.6 07/17/2021 1026   PROT 8.5 07/17/2021 1026   ALBUMIN 3.8 (L) 07/17/2021 1026   AST 12 07/17/2021 1026   ALT 8 07/17/2021 1026   ALKPHOS 84 07/17/2021 1026   BILITOT 0.2 07/17/2021 1026   GFRNONAA >60 11/03/2020 0600   GFRAA 125 06/05/2020 1046     Diabetic Labs (most recent): Lab Results  Component Value Date   HGBA1C 9.0 (A) 07/22/2021   HGBA1C 10.6 04/18/2021   HGBA1C 8.6 (H) 11/02/2020     Lipid Panel ( most recent) Lipid Panel     Component Value Date/Time   CHOL 113 07/17/2021 1026   TRIG 156 (H) 07/17/2021 1026   HDL 37 (L) 07/17/2021 1026   CHOLHDL 3.1 07/17/2021 1026   LDLCALC 49 07/17/2021 1026   LABVLDL 27 07/17/2021 1026      Lab Results  Component Value Date   TSH 1.303 11/02/2020   TSH 3.360 06/05/2020   TSH 1.111 12/26/2019   TSH 1.36 01/26/2019   TSH 1.78 10/13/2016   FREET4 0.98 11/02/2020   FREET4 1.06 06/05/2020   FREET4 0.86 01/26/2019      Assessment & Plan:   1. DM (diabetes mellitus) type 2, uncontrolled, with ketoacidosis (HCC)   -  JERROLD HASKELL has currently uncontrolled symptomatic type 2 DM since  46 years of age.   Mr. Hagood returns with his logs showing improvement in his glycemic profile.  His A1c is 9%, improving from 10.6%.  His average blood glucose is between 180-220 mcg.   No major sustained hypoglycemia documented.   His A1c was 12.7% in March 2021.    - Recent labs reviewed. - I had a long discussion with him about the progressive nature of diabetes and the pathology behind its complications. -his diabetes is complicated by peripheral arterial disease, chronic smoking, pancreatitis, and he remains at extremely high risk  for more acute and chronic complications which include CAD, CVA, CKD, retinopathy, and neuropathy. These are all discussed in detail with him.  - I have counseled him on diet  and weight management  by adopting a carbohydrate restricted/protein rich diet. Patient is encouraged to switch to  unprocessed or minimally processed     complex starch and increased protein intake (animal or plant source), fruits, and vegetables. -  he is advised to stick to a routine mealtimes to eat 3 meals  a day and avoid unnecessary snacks ( to snack only to correct hypoglycemia).   - he acknowledges that there is a room for improvement in his food and drink choices. - Suggestion is made for him to avoid simple carbohydrates  from his diet including Cakes, Sweet Desserts, Ice Cream, Soda (diet and regular), Sweet Tea, Candies, Chips, Cookies, Store Bought Juices, Alcohol in Excess of  1-2 drinks a day, Artificial Sweeteners,  Coffee Creamer, and "Sugar-free" Products, Lemonade. This will help patient to have  more stable blood glucose profile and potentially avoid unintended weight gain.  - he has been scheduled with Jearld Fenton, RDN, CDE for diabetes education.  - I have approached him with the following individualized plan to manage  his diabetes and patient agrees:   -In light of his presentation  with chronically uncontrolled type 2 diabetes with chronic hyperglycemic burden, hypertriglyceridemia, he will continue to need intensive treatment with basal/bolus insulin in order to achieve control of diabetes to target.     -His history is suggestive of type 2 diabetes rather than type 1, will be considered for work-up for proper classification on subsequent visits.    -In the meantime, he is advised to continue Lantus 60 units nightly, continue NovoLog 18  units 3 times a day with meals  for pre-meal BG readings of 90-150mg /dl, plus patient specific correction dose for unexpected hyperglycemia above 150mg /dl, associated with strict monitoring of glucose 4 times a day-before meals and at bedtime. - he is warned not to take insulin without proper monitoring per orders. - Adjustment parameters are given to him for hypo and hyperglycemia in writing. - he is encouraged to call clinic for blood glucose levels less than 70 or above 300 mg /dl. -He will be treated exclusively with insulin at this time.     - he is not a candidate for SGLT2 inhibitors nor incretin therapy due to his body habitus and recent pancreatitis.   He remains a very heavy chronic smoker.   - Specific targets for  A1c;  LDL, HDL,  and Triglycerides were discussed with the patient.  2) Blood Pressure /Hypertension:  -His blood pressure is controlled to target.  He is advised to discontinue lisinopril and metoprolol.  Patient is symptomatic of lightheadedness at times.  He is advised to measure blood pressure at home and report if readings are greater than 140/90.     3) Lipids/Hyperlipidemia:   His previsit lipid panel showed improved LDL at 72, hypertriglyceridemia at 178.  He will continue to benefit from statin intervention.  He is advised to continue pravastatin 20 mg p.o. nightly.  Side effects precautions discussed with him.    4)  Weight/Diet:  Body mass index is 25.09 kg/m.  - he is not a candidate for weight loss.   He is losing weight unintentionally.  Exercise, and detailed carbohydrates information provided  -  detailed on discharge instructions. -This patient may need Creon intervention if he continues to lose weight given his history of pancreatitis.  5) Chronic Care/Health Maintenance:  -he  is on ACEI/ARB and Statin medications and  is encouraged to initiate and continue to follow up with Ophthalmology, Dentist,  Podiatrist at least yearly or according to recommendations, and advised to  quit smoking. I have recommended yearly flu vaccine and pneumonia vaccine at least every 5 years; moderate intensity exercise for up to 150 minutes weekly; and  sleep for at least 7 hours a day.  The patient was counseled on the dangers of tobacco use, and was advised to quit.  Reviewed strategies to maximize success, including removing cigarettes and smoking materials from environment.   He was evaluated by vascular surgery, further work-up did not show significant peripheral arterial disease.   - he is  advised to maintain close follow up with Jani Gravel, MD for primary care needs, as well as his other providers for optimal and coordinated care.    I spent 41 minutes in the care of the patient today including review of  labs from [facility], Lipids, Thyroid Function, Hematology (current and previous including abstractions from other facilities); face-to-face time discussing  his blood glucose readings/logs, discussing hypoglycemia and hyperglycemia episodes and symptoms, medications doses, his options of short and long term treatment based on the latest standards of care / guidelines;  discussion about incorporating lifestyle medicine;  and documenting the encounter.    Please refer to Patient Instructions for Blood Glucose Monitoring and Insulin/Medications Dosing Guide"  in media tab for additional information. Please  also refer to " Patient Self Inventory" in the Media  tab for reviewed elements of pertinent patient  history.  [name] participated in the discussions, expressed understanding, and voiced agreement with the above plans.  All questions were answered to his satisfaction. he is encouraged to contact clinic should he have any questions or concerns prior to his return visit.  Follow up plan: - Return in about 3 months (around 10/19/2021) for Bring Meter and Logs- A1c in Office.  Glade Lloyd, MD [facility] [address] [place], [place] [address] Phone: [phone]  Fax: [phone]    07/22/2021, 1:16 PM  This note was partially dictated with voice recognition software. Similar sounding words can be transcribed inadequately or may not  be corrected upon review.

## 2021-07-22 NOTE — Patient Instructions (Signed)

## 2021-07-23 ENCOUNTER — Other Ambulatory Visit: Payer: Self-pay | Admitting: "Endocrinology

## 2021-08-13 ENCOUNTER — Other Ambulatory Visit: Payer: Self-pay | Admitting: "Endocrinology

## 2021-08-13 DIAGNOSIS — E1110 Type 2 diabetes mellitus with ketoacidosis without coma: Secondary | ICD-10-CM

## 2021-08-26 ENCOUNTER — Ambulatory Visit (INDEPENDENT_AMBULATORY_CARE_PROVIDER_SITE_OTHER): Payer: Medicaid Other | Admitting: Pulmonary Disease

## 2021-08-26 ENCOUNTER — Other Ambulatory Visit: Payer: Self-pay

## 2021-08-26 ENCOUNTER — Encounter (INDEPENDENT_AMBULATORY_CARE_PROVIDER_SITE_OTHER): Payer: Self-pay

## 2021-08-26 ENCOUNTER — Encounter: Payer: Self-pay | Admitting: Pulmonary Disease

## 2021-08-26 VITALS — BP 110/62 | HR 112 | Temp 98.4°F | Ht 73.0 in | Wt 186.8 lb

## 2021-08-26 DIAGNOSIS — G4734 Idiopathic sleep related nonobstructive alveolar hypoventilation: Secondary | ICD-10-CM

## 2021-08-26 DIAGNOSIS — J8281 Chronic eosinophilic pneumonia: Secondary | ICD-10-CM

## 2021-08-26 DIAGNOSIS — Z720 Tobacco use: Secondary | ICD-10-CM | POA: Diagnosis not present

## 2021-08-26 DIAGNOSIS — J849 Interstitial pulmonary disease, unspecified: Secondary | ICD-10-CM

## 2021-08-26 MED ORDER — PREDNISONE 5 MG PO TABS: 5.0000 mg | ORAL_TABLET | Freq: Every day | ORAL | 3 refills | Status: AC

## 2021-08-26 NOTE — Patient Instructions (Signed)
Change prednisone to 5 mg pill daily ? ?Follow up in 3 months ?

## 2021-08-26 NOTE — Progress Notes (Signed)
? ?[facility] ? ?Chief Complaint  ?Patient presents with  ? Follow-up  ?  Cough Clear mucus about the same since last ov.   ? ? ? ?Constitutional:  ?BP 110/62 (BP Location: Left Arm, Patient Position: Sitting)   Pulse (!) 112   Temp 98.4 ?F (36.9 ?C) (Temporal)   Ht '6\' 1"'$  (1.854 m)   Wt 186 lb 12.8 oz (84.7 kg)   SpO2 97% Comment: ra  BMI 24.65 kg/m?  ? ?Past Medical History:  ?Bipolar, ED, HTN, GERD, Pneumonia, HLD, DM type 2 ? ?Past Surgical History:  ?He  has a past surgical history that includes Appendectomy; Knee surgery (Bilateral); Cholecystectomy (N/A, 07/11/2013); Video bronchoscopy (Bilateral, 02/09/2018); Incision and drainage abscess / hematoma of bursa / knee / thigh; Colonoscopy with propofol (N/A, 01/15/2021); Esophagogastroduodenoscopy (egd) with propofol (N/A, 01/15/2021); biopsy (01/15/2021); and polypectomy (01/15/2021). ? ?Brief Summary:  ?[name] is a 46 y.o. 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 ?  ? ? ? ?Subjective:  ? ?He reports that he stopped smoking marijuana after he got the flu in January. ? ?He still smokes 3 to 4 cigarettes per day. ? ?Not having as much cough, wheeze, or sputum.  Keeps up with routine activities okay.  Using oxygen at night.  Symbicort helps.  Uses albuterol few times per week.  Remains on 10 mg prednisone daily. ? ?Physical Exam:  ? ?Appearance - well kempt  ? ?ENMT - no sinus tenderness, no oral exudate, no LAN, Mallampati 3 airway, no stridor ? ?Respiratory - b/l inspiratory squeaks and crackles ? ?CV - s1s2 regular rate and rhythm, no murmurs ? ?Ext - no clubbing, no edema ? ?Skin - no rashes ? ?Psych - normal mood and affect ? ? ? ?  ?Pulmonary testing:  ?Bronchoscopy 02/09/18 >> acute inflammation, 165 WBC (63% N, 2% L, 12% M, 23% E) ?IgE 02/16/18 >> 369 ?ACE 11/07/20 >> 34 ?PFT 11/26/20 >> FEV1 1.73 (44%), FEV1% 75, TLC 4.89 (64%), DLCO 36% ?Serology 05/24/21 >> ANA, ANCA  negative ?IgE 05/24/21 >> 43 ? ?Chest Imaging:  ?CT chest 12/02/17 >> diffuse GGO with mild BTX, no honeycombing w/o change from 2014 ?HRCT chest 11/23/20 >> enlarged LN, bronchial wall thickening, mod/severe pulmonary fibrosis with extensive peribronchovascular areas of traction BTX, honeycombing, interlobular septal thickening, and architectural distortion ?HRCT chest 04/26/21 >> stable findings compared to June 2022 ? ?Cardiac Tests:  ?Echo 11/02/20 >> EF 50 to 55% ? ?Social History:  ?He  reports that he has been smoking cigarettes. He has a 2.50 pack-year smoking history. He has never used smokeless tobacco. He reports that he does not drink alcohol and does not use drugs. ? ?Family History:  ?His family history includes CAD in his maternal grandmother; Diabetes in his maternal grandmother and mother. ?  ? ? ?Discussion:  ?He was originally diagnosed with eosinophilic pneumonia in 3491.  He was lost to follow up until June 2022.  His CT imaging and PFT are consistent with advanced ILD. ? ?Assessment/Plan:  ? 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 ?- will change prednisone to 5 mg daily ?- continue symbicort 160 two puffs bid ?- prn albuterol ?- defer discussion about referral for lung transplant assessment until he is off cigarettes  ? ?Nocturnal hypoxemia. ?- 2 liters oxygen at night ?- uses [employer] for his DME ? ?CKD 2. ?- followed by Dr. Ulice Bold with [facility] ? ?DM type 2. ?-  follow up with his PCP to adjust regimen while he is on prednisone ? ?Tobacco abuse. ?- he is using nicotine patch ? ?Time Spent Involved in Patient Care on Day of Examination:  ?38 minutes ? ?Follow up:  ? ?Patient Instructions  ?Change prednisone to 5 mg pill daily ? ?Follow up in 3 months ? ?Medication List:  ? ?Allergies as of 08/26/2021   ?No Known Allergies ?  ? ?  ?Medication List  ?  ? ?  ? Accurate as of August 26, 2021 12:08 PM. If you have any questions, ask your  nurse or doctor.  ?  ?  ? ?  ? ?Accu-Chek FastClix Lancets Misc ?USE TO TEST BLOOD SUGAR FOUR TIMES DAILY. ?  ?Accu-Chek Guide test strip ?Generic drug: glucose blood ?USE TO CHECK BLOOD SUGAR FOUR TIMES DAILY ?  ?acetaminophen 500 MG tablet ?Commonly known as: TYLENOL ?Take 500-1,000 mg by mouth See admin instructions. Take 1-2 tablets (500-1,000 mg) by mouth every 6 hours as needed for pain & take 2 tablets (1000 mg) by mouth SCHEDULED at bedtime. ?  ?albuterol 108 (90 Base) MCG/ACT inhaler ?Commonly known as: VENTOLIN HFA ?Inhale 2 puffs into the lungs every 6 (six) hours as needed. ?What changed: reasons to take this ?  ?aspirin 81 MG EC tablet ?Take 1 tablet (81 mg total) by mouth daily with breakfast. ?  ?chlorthalidone 25 MG tablet ?Commonly known as: HYGROTON ?Take 25 mg by mouth at bedtime. ?  ?divalproex 500 MG DR tablet ?Commonly known as: DEPAKOTE ?Take 2 tablets (1,000 mg total) by mouth at bedtime. ?  ?fenofibrate 145 MG tablet ?Commonly known as: TRICOR ?Take 145 mg by mouth at bedtime. ?  ?ferrous sulfate 325 (65 FE) MG tablet ?Take 325 mg by mouth daily with breakfast. ?  ?Lantus SoloStar 100 UNIT/ML Solostar Pen ?Generic drug: insulin glargine ?INJECT 60 UNITS INTO THE SKIN AT BEDTIME. ?  ?midodrine 5 MG tablet ?Commonly known as: PROAMATINE ?Take 1 tablet (5 mg total) by mouth 3 (three) times daily with meals. ?  ?multivitamin with minerals Tabs tablet ?Take 1 tablet by mouth in the morning. One-A-Day Men's Multivitamin ?  ?NovoLOG FlexPen 100 UNIT/ML FlexPen ?Generic drug: insulin aspart ?INJECT 18 UNITS INTO THE SKIN THREE TIMES DAILY WITH MEALS ?  ?omeprazole 20 MG capsule ?Commonly known as: PRILOSEC ?Take 20 mg by mouth at bedtime. ?  ?predniSONE 5 MG tablet ?Commonly known as: DELTASONE ?Take 1 tablet (5 mg total) by mouth daily with breakfast. ?Started by: Chesley Mires, MD ?  ?risperidone 4 MG tablet ?Commonly known as: RISPERDAL ?Take 4 mg by mouth at bedtime. ?  ?simvastatin 20 MG  tablet ?Commonly known as: ZOCOR ?Take 20 mg by mouth at bedtime. ?  ?solifenacin 5 MG tablet ?Commonly known as: VESICARE ?Take 5 mg by mouth at bedtime. ?  ?Sure Comfort Insulin Syringe 30G X 1/2" 1 ML Misc ?Generic drug: Insulin Syringe-Needle U-100 ?USE AS DIRECTED PER PROVIDER TO INJECT INSULIN DAILY. ?  ?BD Veo Insulin Syringe U/F 31G X 15/64" 1 ML Misc ?Generic drug: Insulin Syringe-Needle U-100 ?USE AS DIRECTED PER PROVIDER TO INJECT INSULIN DAILY. ?  ?Sure Comfort Pen Needles 31G X 8 MM Misc ?Generic drug: Insulin Pen Needle ?USE TO INJECT INSULIN FOUR TIMES DAILY. ?  ?Symbicort 160-4.5 MCG/ACT inhaler ?Generic drug: budesonide-formoterol ?Inhale 2 puffs into the lungs 2 (two) times daily. ?  ? ?  ? ? ?Signature:  ?Chesley Mires, MD ?[facility] ?Pager - [phone]  [phone] ?08/26/2021, 12:08 PM ?  ? ? ? ? ? ? ? ? ?

## 2021-09-02 ENCOUNTER — Other Ambulatory Visit (HOSPITAL_COMMUNITY): Payer: Self-pay | Admitting: Nurse Practitioner

## 2021-09-02 ENCOUNTER — Ambulatory Visit (HOSPITAL_COMMUNITY)
Admission: RE | Admit: 2021-09-02 | Discharge: 2021-09-02 | Disposition: A | Discharge status: Home / Self Care | Payer: Medicaid Other | Source: Ambulatory Visit | Attending: Nurse Practitioner | Admitting: Nurse Practitioner

## 2021-09-02 DIAGNOSIS — R1084 Generalized abdominal pain: Secondary | ICD-10-CM | POA: Insufficient documentation

## 2021-09-04 ENCOUNTER — Encounter (INDEPENDENT_AMBULATORY_CARE_PROVIDER_SITE_OTHER): Payer: Self-pay | Admitting: *Deleted

## 2021-09-22 ENCOUNTER — Emergency Department (HOSPITAL_COMMUNITY)
Admission: EM | Admit: 2021-09-22 | Discharge: 2021-09-23 | Disposition: A | Discharge status: Home / Self Care | Payer: Medicaid Other | Attending: Emergency Medicine | Admitting: Emergency Medicine

## 2021-09-22 ENCOUNTER — Emergency Department (HOSPITAL_COMMUNITY): Payer: Medicaid Other

## 2021-09-22 ENCOUNTER — Encounter (HOSPITAL_COMMUNITY): Payer: Self-pay

## 2021-09-22 ENCOUNTER — Other Ambulatory Visit: Payer: Self-pay

## 2021-09-22 DIAGNOSIS — E162 Hypoglycemia, unspecified: Secondary | ICD-10-CM

## 2021-09-22 DIAGNOSIS — I10 Essential (primary) hypertension: Secondary | ICD-10-CM | POA: Diagnosis not present

## 2021-09-22 DIAGNOSIS — R0602 Shortness of breath: Secondary | ICD-10-CM | POA: Diagnosis not present

## 2021-09-22 DIAGNOSIS — Z20822 Contact with and (suspected) exposure to covid-19: Secondary | ICD-10-CM | POA: Insufficient documentation

## 2021-09-22 DIAGNOSIS — E11649 Type 2 diabetes mellitus with hypoglycemia without coma: Secondary | ICD-10-CM | POA: Insufficient documentation

## 2021-09-22 DIAGNOSIS — R109 Unspecified abdominal pain: Secondary | ICD-10-CM | POA: Diagnosis not present

## 2021-09-22 DIAGNOSIS — R55 Syncope and collapse: Secondary | ICD-10-CM | POA: Insufficient documentation

## 2021-09-22 DIAGNOSIS — F17210 Nicotine dependence, cigarettes, uncomplicated: Secondary | ICD-10-CM | POA: Diagnosis not present

## 2021-09-22 DIAGNOSIS — R079 Chest pain, unspecified: Secondary | ICD-10-CM | POA: Diagnosis not present

## 2021-09-22 DIAGNOSIS — R42 Dizziness and giddiness: Secondary | ICD-10-CM | POA: Diagnosis present

## 2021-09-22 LAB — BASIC METABOLIC PANEL
Anion gap: 10 (ref 5–15)
BUN: 18 mg/dL (ref 6–20)
CO2: 27 mmol/L (ref 22–32)
Calcium: 8.9 mg/dL (ref 8.9–10.3)
Chloride: 100 mmol/L (ref 98–111)
Creatinine, Ser: 1.29 mg/dL — ABNORMAL HIGH (ref 0.61–1.24)
GFR, Estimated: >60 mL/min (ref >60)
Glucose, Bld: 111 mg/dL — ABNORMAL HIGH (ref 70–99)
Potassium: 3.5 mmol/L (ref 3.5–5.1)
Sodium: 137 mmol/L (ref 135–145)

## 2021-09-22 LAB — CBC WITH DIFFERENTIAL/PLATELET
Abs Immature Granulocytes: 0.01 K/uL (ref 0.00–0.07)
Basophils Absolute: 0 K/uL (ref 0.0–0.1)
Basophils Relative: 0 %
Eosinophils Absolute: 0.2 K/uL (ref 0.0–0.5)
Eosinophils Relative: 3 %
HCT: 37.9 % — ABNORMAL LOW (ref 39.0–52.0)
Hemoglobin: 11.8 g/dL — ABNORMAL LOW (ref 13.0–17.0)
Immature Granulocytes: 0 %
Lymphocytes Relative: 32 %
Lymphs Abs: 2.3 K/uL (ref 0.7–4.0)
MCH: 27.4 pg (ref 26.0–34.0)
MCHC: 31.1 g/dL (ref 30.0–36.0)
MCV: 88.1 fL (ref 80.0–100.0)
Monocytes Absolute: 0.7 K/uL (ref 0.1–1.0)
Monocytes Relative: 10 %
Neutro Abs: 3.9 K/uL (ref 1.7–7.7)
Neutrophils Relative %: 55 %
Platelets: 255 K/uL (ref 150–400)
RBC: 4.3 MIL/uL (ref 4.22–5.81)
RDW: 14.6 % (ref 11.5–15.5)
WBC: 7.2 K/uL (ref 4.0–10.5)
nRBC: 0 % (ref 0.0–0.2)

## 2021-09-22 LAB — TROPONIN I (HIGH SENSITIVITY): Troponin I (High Sensitivity): 5 ng/L (ref <18)

## 2021-09-22 LAB — RESP PANEL BY RT-PCR (FLU A&B, COVID) ARPGX2
Influenza A by PCR: NEGATIVE
Influenza B by PCR: NEGATIVE
SARS Coronavirus 2 by RT PCR: NEGATIVE

## 2021-09-22 LAB — PROTIME-INR
INR: 1 (ref 0.8–1.2)
Prothrombin Time: 13.5 s (ref 11.4–15.2)

## 2021-09-22 LAB — CBG MONITORING, ED: Glucose-Capillary: 80 mg/dL (ref 70–99)

## 2021-09-22 LAB — LIPASE, BLOOD: Lipase: 24 U/L (ref 11–51)

## 2021-09-22 MED ORDER — SODIUM CHLORIDE 0.9 % IV BOLUS
1000.0000 mL | Freq: Once | INTRAVENOUS | Status: AC
  Administered 2021-09-22: 1000 mL via INTRAVENOUS

## 2021-09-22 NOTE — ED Triage Notes (Signed)
Pt c/o low blood sugars for the last week. States he has felt very weak and has not had an appetite.  ? ?CBG in triage- 80 ?

## 2021-09-22 NOTE — ED Provider Notes (Signed)
?[place] DEPT ?[facility] Emergency Department ?Provider Note ?MRN:  [id number]  ?Arrival date & time: 09/23/21    ? ?Chief Complaint   ?Hypoglycemia ?  ?History of Present Illness   ?[name] is a 46 y.o. year-old male with a history of bipolar disorder, diabetes, interstitial lung disease presenting to the ED with chief complaint of hypoglycemia. ? ?2 weeks of intermittent hypoglycemic episodes down into the 40s.  Having intermittent lightheadedness and near syncopal episodes when standing up.  Explains that his sugars have been running low and he stopped taking any insulin 2 weeks ago but the episodes are still happening.  Has been having some chronic abdominal pain that radiates into the back for several weeks.  Also endorsing some chest pain and shortness of breath which is more chronic.  Denies fever, no nausea vomiting or diarrhea. ? ?Review of Systems  ?A thorough review of systems was obtained and all systems are negative except as noted in the HPI and PMH.  ? ?Patient's Health History   ? ?Past Medical History:  ?Diagnosis Date  ? Bipolar disorder ([place])   ? Chronic respiratory failure ([place])   ? Erectile dysfunction   ? Essential hypertension   ? GERD (gastroesophageal reflux disease)   ? History of pneumonia   ? Hyperlipidemia   ? Interstitial lung disease ([place])   ? Pulmonary eosinophilia  ? Obesity   ? Pulmonary eosinophilia ([place])   ? Sinus tachycardia   ? Tobacco abuse   ? Type 2 diabetes mellitus ([place])   ?  ?Past Surgical History:  ?Procedure Laterality Date  ? APPENDECTOMY    ? BIOPSY  01/15/2021  ? Procedure: BIOPSY;  Surgeon: Harvel Quale, MD;  [facility];  Service: Gastroenterology;;  ? CHOLECYSTECTOMY N/A 07/11/2013  ? Procedure: LAPAROSCOPIC CHOLECYSTECTOMY;  Surgeon: Jamesetta So, MD;  Location: [facility] ORS;  Service: General;  Laterality: N/A;  ? COLONOSCOPY WITH PROPOFOL N/A 01/15/2021  ? Procedure: COLONOSCOPY WITH PROPOFOL;  Surgeon: Harvel Quale, MD;  [facility];  Service: Gastroenterology;  Laterality: N/A;  10:40  ? ESOPHAGOGASTRODUODENOSCOPY (EGD) WITH PROPOFOL N/A 01/15/2021  ? Procedure: ESOPHAGOGASTRODUODENOSCOPY (EGD) WITH PROPOFOL;  Surgeon: Harvel Quale, MD;  [facility];  Service: Gastroenterology;  Laterality: N/A;  ? INCISION AND DRAINAGE ABSCESS / HEMATOMA OF BURSA / KNEE / THIGH    ? KNEE SURGERY Bilateral   ? POLYPECTOMY  01/15/2021  ? Procedure: POLYPECTOMY;  Surgeon: Harvel Quale, MD;  [facility];  Service: Gastroenterology;;  ? VIDEO BRONCHOSCOPY Bilateral 02/09/2018  ? Procedure: VIDEO BRONCHOSCOPY WITHOUT FLUORO;  Surgeon: Brand Males, MD;  Location: [facility] ENDOS[facility];  Service: Cardiopulmonary;  Laterality: Bilateral;  ?  ?Family History  ?Problem Relation Age of Onset  ? Diabetes Mother   ? Diabetes Maternal Grandmother   ? CAD Maternal Grandmother   ?     heart attack in her 60s  ? Pancreatitis Neg Hx   ? Colon cancer Neg Hx   ?  ?Social History  ? ?Socioeconomic History  ? Marital status: Single  ?  Spouse name: Not on file  ? Number of children: 0  ? Years of education: Not on file  ? Highest education level: Not on file  ?Occupational History  ? Not on file  ?Tobacco Use  ? Smoking status: Some Days  ?  Packs/day: 0.25  ?  Years: 10.00  ?  Pack years: 2.50  ?  Types: Cigarettes  ?  Smokeless tobacco: Never  ? Tobacco comments:  ?  smokes 1/2 pack per week 01/21/21 [facility]  ?Vaping Use  ? Vaping Use: Never used  ?Substance and Sexual Activity  ? Alcohol use: No  ? Drug use: No  ? Sexual activity: Never  ?Other Topics Concern  ? Not on file  ?Social History Narrative  ? Not on file  ? ?Social Determinants of Health  ? ?Financial Resource Strain: Not on file  ?Food Insecurity: Not on file  ?Transportation Needs: Not on file  ?Physical Activity: Not on file  ?Stress: Not on file  ?Social Connections: Not on file  ?Intimate Partner Violence: Not on file  ?   ? ?Physical Exam  ? ?Vitals:  ? 09/23/21 0100 09/23/21 0200  ?BP: 127/83 112/72  ?Pulse: 85 94  ?Resp: 17 (!) 24  ?Temp:    ?SpO2: 95% 97%  ?  ?CONSTITUTIONAL: Chronically ill-appearing, NAD ?NEURO/PSYCH:  Alert and oriented x 3, no focal deficits ?EYES:  eyes equal and reactive ?ENT/NECK:  no LAD, no JVD ?CARDIO: Regular rate, well-perfused, normal S1 and S2 ?PULM:  CTAB no wheezing or rhonchi ?GI/GU:  non-distended, non-tender ?MSK/SPINE:  No gross deformities, no edema ?SKIN:  no rash, atraumatic ? ? ?*Additional and/or pertinent findings included in MDM below ? ?Diagnostic and Interventional Summary  ? ? EKG Interpretation ? ?Date/Time:  Sunday September 22 2021 21:37:48 EDT ?Ventricular Rate:  116 ?PR Interval:  123 ?QRS Duration: 79 ?QT Interval:  314 ?QTC Calculation: 437 ?R Axis:   57 ?Text Interpretation: Sinus tachycardia Probable anteroseptal infarct, old Confirmed by Gerlene Fee [phone]) on 09/22/2021 11:11:08 PM ?  ? ?  ? ?Labs Reviewed  ?BASIC METABOLIC PANEL - Abnormal; Notable for the following components:  ?    Result Value  ? Glucose, Bld 111 (*)   ? Creatinine, Ser 1.29 (*)   ? All other components within normal limits  ?CBC WITH DIFFERENTIAL/PLATELET - Abnormal; Notable for the following components:  ? Hemoglobin 11.8 (*)   ? HCT 37.9 (*)   ? All other components within normal limits  ?HEPATIC FUNCTION PANEL - Abnormal; Notable for the following components:  ? Total Protein 8.5 (*)   ? Albumin 3.0 (*)   ? All other components within normal limits  ?URINALYSIS, ROUTINE W REFLEX MICROSCOPIC - Abnormal; Notable for the following components:  ? Color, Urine AMBER (*)   ? APPearance HAZY (*)   ? Glucose, UA 150 (*)   ? Ketones, ur 5 (*)   ? Protein, ur 100 (*)   ? All other components within normal limits  ?CBG MONITORING, ED - Abnormal; Notable for the following components:  ? Glucose-Capillary 174 (*)   ? All other components within normal limits  ?RESP PANEL BY RT-PCR (FLU A&B, COVID) ARPGX2  ?LIPASE,  BLOOD  ?PROTIME-INR  ?CBG MONITORING, ED  ?TROPONIN I (HIGH SENSITIVITY)  ?TROPONIN I (HIGH SENSITIVITY)  ?  ?CT CHEST ABDOMEN PELVIS WO CONTRAST  ?Final Result  ?  ?DG Chest Port 1 View  ?Final Result  ?  ?  ?Medications  ?sodium chloride 0.9 % bolus 1,000 mL (0 mLs Intravenous Stopped 09/22/21 2355)  ?  ? ?Procedures  /  Critical Care ?Procedures ? ?ED Course and Medical Decision Making  ?Initial Impression and Ddx ?Differential diagnosis includes neoplastic process, hepatic dysfunction, infection causing hypoglycemic episodes.  Given the chronic abdominal pain, chronic appearing chest x-ray with interstitial lung disease, obtaining advanced imaging of the chest and abdomen.  Awaiting labs. ? ?Past medical/surgical history that increases complexity of ED encounter: Interstitial lung disease ? ?Interpretation of Diagnostics ?I personally reviewed the EKG and my interpretation is as follows: Sinus tachycardia ?   ?Labs do not reveal significant blood count or electrolyte disturbance.  Troponin negative x2.  No renal or hepatic failure. ? ?Patient Reassessment and Ultimate Disposition/Management ?Patient has normal vital signs on reassessment, on baseline nighttime oxygen.  No acute distress, no increased work of breathing.  Work-up is unrevealing, CT imaging is stable.  Glucose checked multiple times, no hypoglycemic events.  Patient is appropriate for discharge with close follow-up as there is no indication for further testing or admission. ? ?Patient management required discussion with the following services or consulting groups:  None ? ?Complexity of Problems Addressed ?Acute illness or injury that poses threat of life of bodily function ? ?Additional Data Reviewed and Analyzed ?Further history obtained from: ?None ? ?Additional Factors Impacting ED Encounter Risk ?Consideration of hospitalization ? ?Barth Kirks. Sedonia Small, MD ?[facility] Emergency Medicine ?[place] [email or web address] ? ?Final  Clinical Impressions(s) / ED Diagnoses  ? ?  ICD-10-CM   ?1. Lightheadedness  R42   ?  ?2. Low blood sugar  E16.2   ?  ?  ?ED Discharge Orders   ? ? None  ? ?  ?  ? ?Discharge Instructions Discussed with and Provided

## 2021-09-23 LAB — URINALYSIS, ROUTINE W REFLEX MICROSCOPIC
Bacteria, UA: NONE SEEN
Bilirubin Urine: NEGATIVE
Glucose, UA: 150 mg/dL — AB
Hgb urine dipstick: NEGATIVE
Ketones, ur: 5 mg/dL — AB
Leukocytes,Ua: NEGATIVE
Nitrite: NEGATIVE
Protein, ur: 100 mg/dL — AB
Specific Gravity, Urine: 1.028 (ref 1.005–1.030)
pH: 5 (ref 5.0–8.0)

## 2021-09-23 LAB — HEPATIC FUNCTION PANEL
ALT: 13 U/L (ref 0–44)
AST: 17 U/L (ref 15–41)
Albumin: 3 g/dL — ABNORMAL LOW (ref 3.5–5.0)
Alkaline Phosphatase: 70 U/L (ref 38–126)
Bilirubin, Direct: <0.1 mg/dL (ref 0.0–0.2)
Total Bilirubin: 0.3 mg/dL (ref 0.3–1.2)
Total Protein: 8.5 g/dL — ABNORMAL HIGH (ref 6.5–8.1)

## 2021-09-23 LAB — CBG MONITORING, ED: Glucose-Capillary: 174 mg/dL — ABNORMAL HIGH (ref 70–99)

## 2021-09-23 LAB — TROPONIN I (HIGH SENSITIVITY): Troponin I (High Sensitivity): 7 ng/L (ref <18)

## 2021-09-23 NOTE — Discharge Instructions (Addendum)
You were evaluated in the Emergency Department and after careful evaluation, we did not find any emergent condition requiring admission or further testing in the hospital. ? ?Your exam/testing today is overall reassuring.  Recommend continued follow-up with your regular doctors to discuss your symptoms. ? ?Please return to the Emergency Department if you experience any worsening of your condition.   Thank you for allowing us to be a part of your care. ?

## 2021-09-23 NOTE — ED Notes (Signed)
Pt returned from radiology.

## 2021-10-07 ENCOUNTER — Encounter (INDEPENDENT_AMBULATORY_CARE_PROVIDER_SITE_OTHER): Payer: Self-pay

## 2021-10-07 ENCOUNTER — Encounter (INDEPENDENT_AMBULATORY_CARE_PROVIDER_SITE_OTHER): Payer: Self-pay | Admitting: Gastroenterology

## 2021-10-07 ENCOUNTER — Ambulatory Visit (INDEPENDENT_AMBULATORY_CARE_PROVIDER_SITE_OTHER): Payer: Medicaid Other | Admitting: Gastroenterology

## 2021-10-07 ENCOUNTER — Other Ambulatory Visit (INDEPENDENT_AMBULATORY_CARE_PROVIDER_SITE_OTHER): Payer: Self-pay

## 2021-10-07 VITALS — BP 112/71 | HR 125 | Temp 98.0°F | Ht 73.0 in | Wt 176.3 lb

## 2021-10-07 DIAGNOSIS — R1319 Other dysphagia: Secondary | ICD-10-CM | POA: Diagnosis not present

## 2021-10-07 NOTE — Patient Instructions (Addendum)
Schedule EGD with esophageal biopsies ?If negative EGD, will proceed with esophageal manometry ?

## 2021-10-07 NOTE — Progress Notes (Signed)
[name], M.D. ?Gastroenterology & Hepatology ?[facility] ?[address] ?[place], [place] [address] ? ?Primary Care Physician: ?Jani Gravel, MD ?[place] [id number] ? ?I will communicate my assessment and recommendations to the referring MD via EMR. ? ?Problems: ?Dysphagia ? ?History of Present Illness: ?[name] is a 46 y.o. 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 ? ?The patient was last seen on 01/03/2021. At that time, the patient was scheduled for an EGD and colonoscopy evaluation of iron deficiency anemia. Findings described below. Recommended capsule endoscopy but was canceled.  In fact, the patient had normal iron stores on his most recent blood work-up on 11/02/2020. ? ?Patient reports that for the last month he has presented recurrent episodes of dysphagia to solids. He has presented dysphagia to both solids and liquids, for which he has required to use a straw to swallow liquids. Patient has been losing a significant amount of weight since his symptoms started - 40-50 lb since symptom onset. He reports that he is having mostly a meal a day as he has significant issues with food going down his esophagus.  ? ?Due to this, his PCP started him on Reglan 10 mg TID. States he has had very minimal improvement with this. ? ?Notably, he had a CT chest/abdomen/pelvis w.o contrast on 09/22/2021 which showed severe chronic lung disease with bronchiectasis and diffuse interstitial thickening as well as mediastinal and hilar adenopathy. ? ?The patient denies having any nausea, vomiting, fever, chills, hematochezia, melena, hematemesis, abdominal distention,  jaundice, pruritus. He has 1 MB per day but it is mostly watery. He has presented pain in his lower abdomen since  his dysphagia started. ? ?Most recent blood work-up from 09/22/2021 showed a hemoglobin of 11.8, CMP normal liver enzymes, showed elevated creatinine of 1.29, with normal electrolytes. ? ?Last EGD: 12/2020 ?- Normal esophagus. ?- Normal stomach. ?- Normal examined duodenum. Biopsied. ?Last Colonoscopy: 12/2020 ?Normal TI ?- Four 4 to 8 mm polyps in the sigmoid colon and in the transverse colon, removed with a ?cold snare. Resected and retrieved. ?- Diverticulosis in the sigmoid colon. ?- Non-bleeding internal hemorrhoids. ? ?Path ?A. SMALL BOWEL, BIOPSY:  ?- Duodenal mucosa with no specific histopathologic changes  ?- Negative for increased intraepithelial lymphocytes or villous  ?architectural changes  ? ?B. COLON, TRANSVERSE, POLYPECTOMY:  ?- Tubular adenoma(s) without high-grade dysplasia or malignancy  ? ?C. COLON, SIGMOID, POLYPECTOMY:  ?- Tubular adenoma without high-grade dysplasia or malignancy  ?- Hyperplastic polyp(s)  ? ?Recommended repeat colonoscopy 5 years ? ?Past Medical History: ?Past Medical History:  ?Diagnosis Date  ? Bipolar disorder ([place])   ? Chronic respiratory failure ([place])   ? Erectile dysfunction   ? Essential hypertension   ? GERD (gastroesophageal reflux disease)   ? History of pneumonia   ? Hyperlipidemia   ? Interstitial lung disease ([place])   ? Pulmonary eosinophilia  ? Obesity   ? Pulmonary eosinophilia ([place])   ? Sinus tachycardia   ? Tobacco abuse   ? Type 2 diabetes mellitus ([place])   ? ? ?Past Surgical History: ?Past Surgical History:  ?Procedure Laterality Date  ? APPENDECTOMY    ? BIOPSY  01/15/2021  ? Procedure: BIOPSY;  Surgeon: Harvel Quale, MD;  [facility];  Service: Gastroenterology;;  ? CHOLECYSTECTOMY N/A 07/11/2013  ?  Procedure: LAPAROSCOPIC CHOLECYSTECTOMY;  Surgeon: Jamesetta So, MD;  Location: [facility] OR[facility];  Service: General;  Laterality: N/A;  ? COLONOSCOPY WITH PROPOFOL N/A 01/15/2021  ? Procedure: COLONOSCOPY WITH PROPOFOL;  Surgeon: Harvel Quale, MD;  [facility];  Service: Gastroenterology;  Laterality: N/A;  10:40  ? ESOPHAGOGASTRODUODENOSCOPY (EGD) WITH PROPOFOL N/A 01/15/2021  ? Procedure: ESOPHAGOGASTRODUODENOSCOPY (EGD) WITH PROPOFOL;  Surgeon: Harvel Quale, MD;  [facility];  Service: Gastroenterology;  Laterality: N/A;  ? INCISION AND DRAINAGE ABSCESS / HEMATOMA OF BURSA / KNEE / THIGH    ? KNEE SURGERY Bilateral   ? POLYPECTOMY  01/15/2021  ? Procedure: POLYPECTOMY;  Surgeon: Harvel Quale, MD;  [facility];  Service: Gastroenterology;;  ? VIDEO BRONCHOSCOPY Bilateral 02/09/2018  ? Procedure: VIDEO BRONCHOSCOPY WITHOUT FLUORO;  Surgeon: Brand Males, MD;  [facility];  Service: Cardiopulmonary;  Laterality: Bilateral;  ? ? ?Family History: ?Family History  ?Problem Relation Age of Onset  ? Diabetes Mother   ? Diabetes Maternal Grandmother   ? CAD Maternal Grandmother   ?     heart attack in her 71s  ? Pancreatitis Neg Hx   ? Colon cancer Neg Hx   ? ? ?Social History: ?Social History  ? ?Tobacco Use  ?Smoking Status Some Days  ? Packs/day: 0.25  ? Years: 10.00  ? Pack years: 2.50  ? Types: Cigarettes  ?Smokeless Tobacco Never  ?Tobacco Comments  ? smokes 1/2 pack per week 01/21/21 [facility]  ? ?Social History  ? ?Substance and Sexual Activity  ?Alcohol Use No  ? ?Social History  ? ?Substance and Sexual Activity  ?Drug Use No  ? ? ?Allergies: ?No Known Allergies ? ?Medications: ?Current Outpatient Medications  ?Medication Sig Dispense Refill  ? Accu-Chek FastClix Lancets MISC USE TO TEST BLOOD SUGAR FOUR TIMES DAILY. 102 each 3  ? ACCU-CHEK GUIDE test strip USE TO CHECK BLOOD SUGAR FOUR TIMES DAILY 400 strip 1  ? acetaminophen (TYLENOL) 500 MG tablet Take 500-1,000 mg by mouth See admin instructions. Take 1-2 tablets (500-1,000 mg) by mouth every 6 hours as needed for pain & take 2 tablets (1000 mg) by mouth SCHEDULED at bedtime.    ? albuterol (VENTOLIN HFA) 108 (90 Base)  MCG/ACT inhaler Inhale 2 puffs into the lungs every 6 (six) hours as needed. (Patient taking differently: Inhale 2 puffs into the lungs every 6 (six) hours as needed for shortness of breath or wheezing.) 18 g 0  ? aspirin EC 81 MG EC tablet Take 1 tablet (81 mg total) by mouth daily with breakfast. 30 tablet 11  ? BD VEO INSULIN SYRINGE U/F 31G X 15/64" 1 ML MISC USE AS DIRECTED PER PROVIDER TO INJECT INSULIN DAILY. 100 each 0  ? chlorthalidone (HYGROTON) 25 MG tablet Take 25 mg by mouth at bedtime.    ? divalproex (DEPAKOTE) 500 MG DR tablet Take 2 tablets (1,000 mg total) by mouth at bedtime. 30 tablet 5  ? ergocalciferol (VITAMIN D2) 1.25 MG (50000 UT) capsule Take 50,000 Units by mouth once a week.    ? fenofibrate (TRICOR) 145 MG tablet Take 145 mg by mouth at bedtime.    ? ferrous sulfate 325 (65 FE) MG tablet Take 325 mg by mouth daily with breakfast.    ? Insulin Pen Needle (SURE COMFORT PEN NEEDLES) 31G X 8 MM MISC USE TO INJECT INSULIN FOUR TIMES DAILY. 100 each 2  ? Insulin Syringe-Needle U-100 (SURE COMFORT INSULIN SYRINGE)  30G X 1/2" 1 ML MISC USE AS DIRECTED PER PROVIDER TO INJECT INSULIN DAILY. 100 each 0  ? LANTUS SOLOSTAR 100 UNIT/ML Solostar Pen INJECT 60 UNITS INTO THE SKIN AT BEDTIME. 30 mL 1  ? midodrine (PROAMATINE) 5 MG tablet Take 1 tablet (5 mg total) by mouth 3 (three) times daily with meals. 90 tablet 1  ? Multiple Vitamin (MULTIVITAMIN WITH MINERALS) TABS tablet Take 1 tablet by mouth in the morning. One-A-Day Men's Multivitamin    ? NOVOLOG FLEXPEN 100 UNIT/ML FlexPen INJECT 18 UNITS INTO THE SKIN THREE TIMES DAILY WITH MEALS 15 mL 2  ? omeprazole (PRILOSEC) 20 MG capsule Take 20 mg by mouth at bedtime.    ? predniSONE (DELTASONE) 5 MG tablet Take 1 tablet (5 mg total) by mouth daily with breakfast. 30 tablet 3  ? risperidone (RISPERDAL) 4 MG tablet Take 4 mg by mouth at bedtime.     ? simvastatin (ZOCOR) 20 MG tablet Take 20 mg by mouth at bedtime.    ? solifenacin (VESICARE) 5 MG  tablet Take 5 mg by mouth at bedtime.     ? SYMBICORT 160-4.5 MCG/ACT inhaler Inhale 2 puffs into the lungs 2 (two) times daily. 1 Inhaler 12  ? ?No current facility-administered medications for this visit.  ? ? ?R

## 2021-10-07 NOTE — H&P (View-Only) (Signed)
[name], M.D. Gastroenterology & Hepatology [facility] [address] [place], [place] [address]  Primary Care Physician: Jani Gravel, MD [place] [address]  I will communicate my assessment and recommendations to the referring MD via EMR.  Problems: Dysphagia  History of Present Illness: [name] is a 46 y.o. 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  The patient was last seen on 01/03/2021. At that time, the patient was scheduled for an EGD and colonoscopy evaluation of iron deficiency anemia. Findings described below. Recommended capsule endoscopy but was canceled.  In fact, the patient had normal iron stores on his most recent blood work-up on 11/02/2020.  Patient reports that for the last month he has presented recurrent episodes of dysphagia to solids. He has presented dysphagia to both solids and liquids, for which he has required to use a straw to swallow liquids. Patient has been losing a significant amount of weight since his symptoms started - 40-50 lb since symptom onset. He reports that he is having mostly a meal a day as he has significant issues with food going down his esophagus.   Due to this, his PCP started him on Reglan 10 mg TID. States he has had very minimal improvement with this.  Notably, he had a CT chest/abdomen/pelvis w.o contrast on 09/22/2021 which showed severe chronic lung disease with bronchiectasis and diffuse interstitial thickening as well as mediastinal and hilar adenopathy.  The patient denies having any nausea, vomiting, fever, chills, hematochezia, melena, hematemesis, abdominal distention,  jaundice, pruritus. He has 1 MB per day but it is mostly watery. He has presented pain in his lower abdomen since  his dysphagia started.  Most recent blood work-up from 09/22/2021 showed a hemoglobin of 11.8, CMP normal liver enzymes, showed elevated creatinine of 1.29, with normal electrolytes.  Last EGD: 12/2020 - Normal esophagus. - Normal stomach. - Normal examined duodenum. Biopsied. Last Colonoscopy: 12/2020 Normal TI - Four 4 to 8 mm polyps in the sigmoid colon and in the transverse colon, removed with a cold snare. Resected and retrieved. - Diverticulosis in the sigmoid colon. - Non-bleeding internal hemorrhoids.  Path A. SMALL BOWEL, BIOPSY:  - Duodenal mucosa with no specific histopathologic changes  - Negative for increased intraepithelial lymphocytes or villous  architectural changes   B. COLON, TRANSVERSE, POLYPECTOMY:  - Tubular adenoma(s) without high-grade dysplasia or malignancy   C. COLON, SIGMOID, POLYPECTOMY:  - Tubular adenoma without high-grade dysplasia or malignancy  - Hyperplastic polyp(s)   Recommended repeat colonoscopy 5 years  Past Medical History: Past Medical History:  Diagnosis Date   Bipolar disorder ([place])    Chronic respiratory failure ([place])    Erectile dysfunction    Essential hypertension    GERD (gastroesophageal reflux disease)    History of pneumonia    Hyperlipidemia    Interstitial lung disease ([facility])    Pulmonary eosinophilia   Obesity    Pulmonary eosinophilia ([facility])    Sinus tachycardia    Tobacco abuse    Type 2 diabetes mellitus ([place])     Past Surgical History: Past Surgical History:  Procedure Laterality Date   APPENDECTOMY     BIOPSY  01/15/2021   Procedure: BIOPSY;  Surgeon: Harvel Quale, MD;  Location: [facility];  Service: Gastroenterology;;   CHOLECYSTECTOMY N/A 07/11/2013  Procedure: LAPAROSCOPIC CHOLECYSTECTOMY;  Surgeon: Jamesetta So, MD;  Location: [facility];  Service: General;  Laterality: N/A;   COLONOSCOPY WITH PROPOFOL N/A 01/15/2021   Procedure: COLONOSCOPY WITH PROPOFOL;  Surgeon: Harvel Quale, MD;  [facility];  Service: Gastroenterology;  Laterality: N/A;  10:40   ESOPHAGOGASTRODUODENOSCOPY (EGD) WITH PROPOFOL N/A 01/15/2021   Procedure: ESOPHAGOGASTRODUODENOSCOPY (EGD) WITH PROPOFOL;  Surgeon: Harvel Quale, MD;  [facility];  Service: Gastroenterology;  Laterality: N/A;   INCISION AND DRAINAGE ABSCESS / HEMATOMA OF BURSA / KNEE / THIGH     KNEE SURGERY Bilateral    POLYPECTOMY  01/15/2021   Procedure: POLYPECTOMY;  Surgeon: Harvel Quale, MD;  [facility];  Service: Gastroenterology;;   VIDEO BRONCHOSCOPY Bilateral 02/09/2018   Procedure: VIDEO BRONCHOSCOPY WITHOUT FLUORO;  Surgeon: Brand Males, MD;  Location: [facility];  Service: Cardiopulmonary;  Laterality: Bilateral;    Family History: Family History  Problem Relation Age of Onset   Diabetes Mother    Diabetes Maternal Grandmother    CAD Maternal Grandmother        heart attack in her 75s   Pancreatitis Neg Hx    Colon cancer Neg Hx     Social History: Social History   Tobacco Use  Smoking Status Some Days   Packs/day: 0.25   Years: 10.00   Pack years: 2.50   Types: Cigarettes  Smokeless Tobacco Never  Tobacco Comments   smokes 1/2 pack per week 01/21/21 [facility]   Social History   Substance and Sexual Activity  Alcohol Use No   Social History   Substance and Sexual Activity  Drug Use No    Allergies: No Known Allergies  Medications: Current Outpatient Medications  Medication Sig Dispense Refill   Accu-Chek FastClix Lancets MISC USE TO TEST BLOOD SUGAR FOUR TIMES DAILY. 102 each 3   ACCU-CHEK GUIDE test strip USE TO CHECK BLOOD SUGAR FOUR TIMES DAILY 400 strip 1   acetaminophen (TYLENOL) 500 MG tablet Take 500-1,000 mg by mouth See admin instructions. Take 1-2 tablets (500-1,000 mg) by mouth every 6 hours as needed for pain & take 2 tablets (1000 mg) by mouth SCHEDULED at bedtime.     albuterol (VENTOLIN HFA) 108 (90 Base)  MCG/ACT inhaler Inhale 2 puffs into the lungs every 6 (six) hours as needed. (Patient taking differently: Inhale 2 puffs into the lungs every 6 (six) hours as needed for shortness of breath or wheezing.) 18 g 0   aspirin EC 81 MG EC tablet Take 1 tablet (81 mg total) by mouth daily with breakfast. 30 tablet 11   BD VEO INSULIN SYRINGE U/F 31G X 15/64" 1 ML MISC USE AS DIRECTED PER PROVIDER TO INJECT INSULIN DAILY. 100 each 0   chlorthalidone (HYGROTON) 25 MG tablet Take 25 mg by mouth at bedtime.     divalproex (DEPAKOTE) 500 MG DR tablet Take 2 tablets (1,000 mg total) by mouth at bedtime. 30 tablet 5   ergocalciferol (VITAMIN D2) 1.25 MG (50000 UT) capsule Take 50,000 Units by mouth once a week.     fenofibrate (TRICOR) 145 MG tablet Take 145 mg by mouth at bedtime.     ferrous sulfate 325 (65 FE) MG tablet Take 325 mg by mouth daily with breakfast.     Insulin Pen Needle (SURE COMFORT PEN NEEDLES) 31G X 8 MM MISC USE TO INJECT INSULIN FOUR TIMES DAILY. 100 each 2   Insulin Syringe-Needle U-100 (SURE COMFORT INSULIN SYRINGE)  30G X 1/2" 1 ML MISC USE AS DIRECTED PER PROVIDER TO INJECT INSULIN DAILY. 100 each 0   LANTUS SOLOSTAR 100 UNIT/ML Solostar Pen INJECT 60 UNITS INTO THE SKIN AT BEDTIME. 30 mL 1   midodrine (PROAMATINE) 5 MG tablet Take 1 tablet (5 mg total) by mouth 3 (three) times daily with meals. 90 tablet 1   Multiple Vitamin (MULTIVITAMIN WITH MINERALS) TABS tablet Take 1 tablet by mouth in the morning. One-A-Day Men's Multivitamin     NOVOLOG FLEXPEN 100 UNIT/ML FlexPen INJECT 18 UNITS INTO THE SKIN THREE TIMES DAILY WITH MEALS 15 mL 2   omeprazole (PRILOSEC) 20 MG capsule Take 20 mg by mouth at bedtime.     predniSONE (DELTASONE) 5 MG tablet Take 1 tablet (5 mg total) by mouth daily with breakfast. 30 tablet 3   risperidone (RISPERDAL) 4 MG tablet Take 4 mg by mouth at bedtime.      simvastatin (ZOCOR) 20 MG tablet Take 20 mg by mouth at bedtime.     solifenacin (VESICARE) 5 MG  tablet Take 5 mg by mouth at bedtime.      SYMBICORT 160-4.5 MCG/ACT inhaler Inhale 2 puffs into the lungs 2 (two) times daily. 1 Inhaler 12   No current facility-administered medications for this visit.    Review of Systems: GENERAL: negative for malaise, night sweats HEENT: No changes in hearing or vision, no nose bleeds or other nasal problems. NECK: Negative for lumps, goiter, pain and significant neck swelling RESPIRATORY: Negative for cough, wheezing CARDIOVASCULAR: Negative for chest pain, leg swelling, palpitations, orthopnea GI: SEE HPI MUSCULOSKELETAL: Negative for joint pain or swelling, back pain, and muscle pain. SKIN: Negative for lesions, rash PSYCH: Negative for sleep disturbance, mood disorder and recent psychosocial stressors. HEMATOLOGY Negative for prolonged bleeding, bruising easily, and swollen nodes. ENDOCRINE: Negative for cold or heat intolerance, polyuria, polydipsia and goiter. NEURO: negative for tremor, gait imbalance, syncope and seizures. The remainder of the review of systems is noncontributory.   Physical Exam: BP 112/71 (BP Location: Left Arm, Patient Position: Sitting, Cuff Size: Small)   Pulse (!) 125   Temp 98 F (36.7 C) (Oral)   Ht '6\' 1"'$  (1.854 m)   Wt 176 lb 4.8 oz (80 kg)   BMI 23.26 kg/m  GENERAL: The patient is AO x3, in no acute distress. HEENT: Head is normocephalic and atraumatic. EOMI are intact. Mouth is well hydrated and without lesions. NECK: Supple. No masses LUNGS: Clear to auscultation. No presence of rhonchi/wheezing/rales. Adequate chest expansion HEART: RRR, normal s1 and s2. ABDOMEN: mildly tender in the RLQ, no guarding, no peritoneal signs, and nondistended. BS +. No masses. EXTREMITIES: Without any cyanosis, clubbing, rash, lesions or edema. NEUROLOGIC: AOx3, no focal motor deficit. SKIN: no jaundice, no rashes  Imaging/Labs: as above  I personally reviewed and interpreted the available labs, imaging and  endoscopic files.  Impression and Plan: [name] is a 46 y.o. 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  The patient has presented recurrent episodes of dysphagia.  He underwent a recent EGD for evaluation of anemia which did not show any obstructive alterations in his upper gastrointestinal tract.  However, he has presented recent onset of symptoms of dysphagia, which will need to be investigated further with a repeat EGD with esophageal biopsies.  I explained to him that if this is unremarkable, we will  need to proceed with an esophageal manometry to rule out motility abnormalities which he is agreeable to.  - Schedule EGD with esophageal biopsies - If negative EGD, will proceed with esophageal manometry  All questions were answered.      Harvel Quale, MD Gastroenterology and Hepatology [facility]

## 2021-10-08 DIAGNOSIS — R1310 Dysphagia, unspecified: Secondary | ICD-10-CM | POA: Insufficient documentation

## 2021-10-15 ENCOUNTER — Emergency Department (HOSPITAL_COMMUNITY)
Admission: EM | Admit: 2021-10-15 | Discharge: 2021-10-16 | Disposition: A | Discharge status: Home / Self Care | Payer: Medicaid Other | Attending: Emergency Medicine | Admitting: Emergency Medicine

## 2021-10-15 ENCOUNTER — Other Ambulatory Visit: Payer: Self-pay

## 2021-10-15 ENCOUNTER — Encounter (HOSPITAL_COMMUNITY): Payer: Self-pay

## 2021-10-15 ENCOUNTER — Emergency Department (HOSPITAL_COMMUNITY): Payer: Medicaid Other

## 2021-10-15 DIAGNOSIS — E860 Dehydration: Secondary | ICD-10-CM | POA: Insufficient documentation

## 2021-10-15 DIAGNOSIS — Z7984 Long term (current) use of oral hypoglycemic drugs: Secondary | ICD-10-CM | POA: Insufficient documentation

## 2021-10-15 DIAGNOSIS — R339 Retention of urine, unspecified: Secondary | ICD-10-CM | POA: Diagnosis not present

## 2021-10-15 DIAGNOSIS — Z794 Long term (current) use of insulin: Secondary | ICD-10-CM | POA: Diagnosis not present

## 2021-10-15 DIAGNOSIS — I10 Essential (primary) hypertension: Secondary | ICD-10-CM | POA: Diagnosis not present

## 2021-10-15 DIAGNOSIS — R110 Nausea: Secondary | ICD-10-CM | POA: Diagnosis not present

## 2021-10-15 DIAGNOSIS — Z7982 Long term (current) use of aspirin: Secondary | ICD-10-CM | POA: Diagnosis not present

## 2021-10-15 DIAGNOSIS — E1165 Type 2 diabetes mellitus with hyperglycemia: Secondary | ICD-10-CM | POA: Insufficient documentation

## 2021-10-15 DIAGNOSIS — Z79899 Other long term (current) drug therapy: Secondary | ICD-10-CM | POA: Insufficient documentation

## 2021-10-15 DIAGNOSIS — R000 Tachycardia, unspecified: Secondary | ICD-10-CM | POA: Diagnosis not present

## 2021-10-15 DIAGNOSIS — R627 Adult failure to thrive: Secondary | ICD-10-CM | POA: Diagnosis present

## 2021-10-15 LAB — URINALYSIS, ROUTINE W REFLEX MICROSCOPIC
Bacteria, UA: NONE SEEN
Bilirubin Urine: NEGATIVE
Glucose, UA: NEGATIVE mg/dL
Ketones, ur: 5 mg/dL — AB
Leukocytes,Ua: NEGATIVE
Nitrite: NEGATIVE
Protein, ur: 100 mg/dL — AB
Specific Gravity, Urine: 1.019 (ref 1.005–1.030)
pH: 5 (ref 5.0–8.0)

## 2021-10-15 LAB — HEPATIC FUNCTION PANEL
ALT: 9 U/L (ref 0–44)
AST: 19 U/L (ref 15–41)
Albumin: 3.2 g/dL — ABNORMAL LOW (ref 3.5–5.0)
Alkaline Phosphatase: 68 U/L (ref 38–126)
Bilirubin, Direct: 0.1 mg/dL (ref 0.0–0.2)
Indirect Bilirubin: 0.4 mg/dL (ref 0.3–0.9)
Total Bilirubin: 0.5 mg/dL (ref 0.3–1.2)
Total Protein: 8.5 g/dL — ABNORMAL HIGH (ref 6.5–8.1)

## 2021-10-15 LAB — CBC
HCT: 38.7 % — ABNORMAL LOW (ref 39.0–52.0)
Hemoglobin: 12.3 g/dL — ABNORMAL LOW (ref 13.0–17.0)
MCH: 28.2 pg (ref 26.0–34.0)
MCHC: 31.8 g/dL (ref 30.0–36.0)
MCV: 88.8 fL (ref 80.0–100.0)
Platelets: 266 10*3/uL (ref 150–400)
RBC: 4.36 MIL/uL (ref 4.22–5.81)
RDW: 14.9 % (ref 11.5–15.5)
WBC: 6.4 10*3/uL (ref 4.0–10.5)
nRBC: 0 % (ref 0.0–0.2)

## 2021-10-15 LAB — LACTIC ACID, PLASMA: Lactic Acid, Venous: 1.4 mmol/L (ref 0.5–1.9)

## 2021-10-15 LAB — TROPONIN I (HIGH SENSITIVITY)
Troponin I (High Sensitivity): 4 ng/L (ref <18)
Troponin I (High Sensitivity): 4 ng/L (ref <18)

## 2021-10-15 LAB — CBG MONITORING, ED: Glucose-Capillary: 114 mg/dL — ABNORMAL HIGH (ref 70–99)

## 2021-10-15 LAB — BASIC METABOLIC PANEL
Anion gap: 7 (ref 5–15)
BUN: 12 mg/dL (ref 6–20)
CO2: 26 mmol/L (ref 22–32)
Calcium: 9.1 mg/dL (ref 8.9–10.3)
Chloride: 106 mmol/L (ref 98–111)
Creatinine, Ser: 1.02 mg/dL (ref 0.61–1.24)
GFR, Estimated: >60 mL/min (ref >60)
Glucose, Bld: 102 mg/dL — ABNORMAL HIGH (ref 70–99)
Potassium: 3.6 mmol/L (ref 3.5–5.1)
Sodium: 139 mmol/L (ref 135–145)

## 2021-10-15 LAB — BETA-HYDROXYBUTYRIC ACID: Beta-Hydroxybutyric Acid: 1.19 mmol/L — ABNORMAL HIGH (ref 0.05–0.27)

## 2021-10-15 LAB — MAGNESIUM: Magnesium: 2 mg/dL (ref 1.7–2.4)

## 2021-10-15 LAB — TSH: TSH: 1.517 u[IU]/mL (ref 0.350–4.500)

## 2021-10-15 MED ORDER — IOHEXOL 350 MG/ML SOLN
100.0000 mL | Freq: Once | INTRAVENOUS | Status: AC | PRN
  Administered 2021-10-15: 100 mL via INTRAVENOUS

## 2021-10-15 MED ORDER — LACTATED RINGERS IV BOLUS
1000.0000 mL | Freq: Once | INTRAVENOUS | Status: AC
  Administered 2021-10-15: 1000 mL via INTRAVENOUS

## 2021-10-15 MED ORDER — METHYLPREDNISOLONE SODIUM SUCC 125 MG IJ SOLR
125.0000 mg | Freq: Once | INTRAMUSCULAR | Status: AC
  Administered 2021-10-15: 125 mg via INTRAVENOUS
  Filled 2021-10-15: qty 2

## 2021-10-15 MED ORDER — METOCLOPRAMIDE HCL 5 MG/ML IJ SOLN
10.0000 mg | Freq: Once | INTRAMUSCULAR | Status: AC
  Administered 2021-10-15: 10 mg via INTRAVENOUS
  Filled 2021-10-15: qty 2

## 2021-10-15 MED ORDER — IPRATROPIUM-ALBUTEROL 0.5-2.5 (3) MG/3ML IN SOLN
3.0000 mL | Freq: Once | RESPIRATORY_TRACT | Status: AC
Start: 2021-10-15 — End: 2021-10-15
  Administered 2021-10-15: 3 mL via RESPIRATORY_TRACT
  Filled 2021-10-15: qty 3

## 2021-10-15 NOTE — ED Provider Triage Note (Signed)
Emergency Medicine Provider Triage Evaluation Note ? ?[name] , a 46 y.o. male  was evaluated in triage.  Pt complains of urinary retention.  Says he has not urinated in 3 to 4 days.  This is never happened before.  Endorsing mild abdominal pain.  Also says he has chest pain that is in the middle of his chest and sharp. ? ?Review of Systems  ?Positive: As above ?Negative: Difficulty breathing, changes in bowel movements ? ?Physical Exam  ?BP (!) 126/100 (BP Location: Left Arm)   Pulse (!) 115   Temp 97.6 ?F (36.4 ?C) (Oral)   Resp 20   Ht '6\' 1"'$  (1.854 m)   Wt 77.6 kg   SpO2 96%   BMI 22.56 kg/m?  ?Gen:   Awake, no distress   ?Resp:  Normal effort  ?MSK:   Moves extremities without difficulty  ?Other:  Jaundiced, ill-appearing chronically ? ?Medical Decision Making  ?Medically screening exam initiated at 1:45 PM.  Appropriate orders placed.  [name] was informed that the remainder of the evaluation will be completed by another provider, this initial triage assessment does not replace that evaluation, and the importance of remaining in the ED until their evaluation is complete. ? ?\ ?  ?Rhae Hammock, PA-C ?10/15/21 1345 ? ?

## 2021-10-15 NOTE — ED Notes (Signed)
Patient attempting to urinate at this time. Bladder scan 134 ml.  ?

## 2021-10-15 NOTE — ED Notes (Signed)
Patient states he is unable to hold his head up because he is tired. Patient holding up head in conversation. [place], PA to vertical to assess patient.  ?

## 2021-10-15 NOTE — ED Notes (Signed)
Bladder scan in triage. Scanner states >255m in bladder.  ?

## 2021-10-15 NOTE — ED Triage Notes (Signed)
Patient with complaints of urinary retention for 3 to 4 days.  ?

## 2021-10-15 NOTE — ED Provider Notes (Addendum)
[facility] EMERGENCY DEPARTMENT Provider Note   CSN: [id number] Arrival date & time: 10/15/21  1144     History  Chief Complaint  Patient presents with   Urinary Retention    [name] is a 46 y.o. male.  HPI Patient presents for concern of failure to thrive and urinary retention.  He states that he has not urinated in 3 to 4 days.  Medical history includes DM, bipolar disorder, interstitial lung disease, chronic hypoxia, atrial fibrillation, HLD, PAD, GERD, obesity.  He is on 2 L of supplemental oxygen at baseline.  History is provided by patient, patient's brother, and patient's mother.  Patient's family reports a gradual decline in health over the past 2 weeks.  He has had limited p.o. intake and increasing fatigue which has limited his daily activities.  Patient states that he does not eat due to loss of appetite intermittent nausea.  His sugars have been mildly elevated at home.  Patient was walking earlier today, although feeling severely fatigued.    Home Medications Prior to Admission medications   Medication Sig Start Date End Date Taking? Authorizing Provider  acetaminophen (TYLENOL) 500 MG tablet Take 500-1,000 mg by mouth See admin instructions. Take 1-2 tablets (500-1,000 mg) by mouth every 6 hours as needed for pain & take 2 tablets (1000 mg) by mouth SCHEDULED at bedtime.   Yes [provider]  albuterol (VENTOLIN HFA) 108 (90 Base) MCG/ACT inhaler Inhale 2 puffs into the lungs every 6 (six) hours as needed. Patient taking differently: Inhale 2 puffs into the lungs every 6 (six) hours as needed for shortness of breath or wheezing. 12/28/19  Yes Roxan Hockey, MD  aspirin EC 81 MG EC tablet Take 1 tablet (81 mg total) by mouth daily with breakfast. 12/28/19  Yes Emokpae, Courage, MD  chlorthalidone (HYGROTON) 25 MG tablet Take 25 mg by mouth at bedtime. 09/11/20  Yes [provider]  divalproex (DEPAKOTE) 500 MG DR tablet Take 2 tablets (1,000  mg total) by mouth at bedtime. 12/28/19  Yes Emokpae, Courage, MD  ergocalciferol (VITAMIN D2) 1.25 MG (50000 UT) capsule Take 50,000 Units by mouth once a week. 09/04/21 10/24/21 Yes [provider]  fenofibrate (TRICOR) 145 MG tablet Take 145 mg by mouth at bedtime. 09/11/20  Yes [provider]  ferrous sulfate 325 (65 FE) MG tablet Take 325 mg by mouth daily with breakfast.   Yes [provider]  furosemide (LASIX) 40 MG tablet Take 40 mg by mouth 2 (two) times daily. 10/03/21  Yes [provider]  lisinopril (ZESTRIL) 2.5 MG tablet Take by mouth. 04/30/21 04/30/22 Yes [provider]  metoCLOPramide (REGLAN) 10 MG tablet Take 10 mg by mouth 4 (four) times daily. 08/15/21  Yes [provider]  midodrine (PROAMATINE) 5 MG tablet Take 1 tablet (5 mg total) by mouth 3 (three) times daily with meals. 11/03/20  Yes Tat, Shanon Brow, MD  Multiple Vitamin (MULTIVITAMIN WITH MINERALS) TABS tablet Take 1 tablet by mouth in the morning. One-A-Day Men's Multivitamin   Yes [provider]  omeprazole (PRILOSEC) 20 MG capsule Take 20 mg by mouth at bedtime.   Yes [provider]  predniSONE (DELTASONE) 5 MG tablet Take 1 tablet (5 mg total) by mouth daily with breakfast. 08/26/21  Yes Chesley Mires, MD  risperidone (RISPERDAL) 4 MG tablet Take 4 mg by mouth at bedtime.    Yes [provider]  simvastatin (ZOCOR) 20 MG tablet Take 20 mg by mouth at  bedtime.   Yes [provider]  solifenacin (VESICARE) 5 MG tablet Take 5 mg by mouth at bedtime.    Yes [provider]  SYMBICORT 160-4.5 MCG/ACT inhaler Inhale 2 puffs into the lungs 2 (two) times daily. 12/28/19  Yes Roxan Hockey, MD  Accu-Chek FastClix Lancets MISC USE TO TEST BLOOD SUGAR FOUR TIMES DAILY. 07/16/20   Cassandria Anger, MD  ACCU-CHEK GUIDE test strip USE TO CHECK BLOOD SUGAR FOUR TIMES DAILY 05/06/21   Cassandria Anger, MD  BD VEO INSULIN SYRINGE U/F 31G  X 15/64" 1 ML MISC USE AS DIRECTED PER PROVIDER TO INJECT INSULIN DAILY. 01/14/21   Cassandria Anger, MD  Insulin Pen Needle (SURE COMFORT PEN NEEDLES) 31G X 8 MM MISC USE TO INJECT INSULIN FOUR TIMES DAILY. 07/24/21   Cassandria Anger, MD  Insulin Syringe-Needle U-100 (SURE COMFORT INSULIN SYRINGE) 30G X 1/2" 1 ML MISC USE AS DIRECTED PER PROVIDER TO INJECT INSULIN DAILY. 12/28/19   Emokpae, Courage, MD  LANTUS SOLOSTAR 100 UNIT/ML Solostar Pen INJECT 60 UNITS INTO THE SKIN AT BEDTIME. Patient not taking: Reported on 10/15/2021 08/13/21   Cassandria Anger, MD  NOVOLOG FLEXPEN 100 UNIT/ML FlexPen INJECT 18 UNITS INTO THE SKIN THREE TIMES DAILY WITH MEALS Patient not taking: Reported on 10/15/2021 07/22/21   Cassandria Anger, MD      Allergies    Patient has no known allergies.    Review of Systems   Review of Systems  Constitutional:  Positive for activity change, appetite change and fatigue.  Respiratory:  Positive for shortness of breath.   Cardiovascular:  Positive for chest pain.  Gastrointestinal:  Positive for nausea.  Genitourinary:  Positive for difficulty urinating.  Neurological:  Positive for weakness (generalized).   Physical Exam Updated Vital Signs BP (!) 126/93   Pulse 93   Temp 97.6 F (36.4 C) (Oral)   Resp (!) 25   Ht '6\' 1"'$  (1.854 m)   Wt 77.6 kg   SpO2 95%   BMI 22.56 kg/m  Physical Exam Vitals and nursing note reviewed.  Constitutional:      General: He is not in acute distress.    Appearance: He is well-developed. He is ill-appearing (chronically). He is not toxic-appearing or diaphoretic.  HENT:     Head: Normocephalic and atraumatic.     Right Ear: External ear normal.     Left Ear: External ear normal.     Nose: Nose normal.     Mouth/Throat:     Mouth: Mucous membranes are moist.     Pharynx: Oropharynx is clear.  Eyes:     General: No scleral icterus.    Extraocular Movements: Extraocular movements intact.     Conjunctiva/sclera:  Conjunctivae normal.  Cardiovascular:     Rate and Rhythm: Normal rate and regular rhythm.     Heart sounds: No murmur heard. Pulmonary:     Effort: Pulmonary effort is normal. No respiratory distress.     Breath sounds: Wheezing present. No rales.  Chest:     Chest wall: No tenderness.  Abdominal:     Palpations: Abdomen is soft.     Tenderness: There is no abdominal tenderness.  Musculoskeletal:        General: No swelling or deformity.     Cervical back: Normal range of motion and neck supple. No rigidity.  Skin:    General: Skin is warm and dry.     Capillary Refill: Capillary refill takes less than  2 seconds.     Coloration: Skin is not jaundiced or pale.  Neurological:     General: No focal deficit present.     Mental Status: He is alert and oriented to person, place, and time.     Cranial Nerves: No cranial nerve deficit.     Sensory: No sensory deficit.     Motor: No weakness.     Coordination: Coordination normal.  Psychiatric:        Mood and Affect: Mood normal. Affect is flat.        Speech: Speech normal.        Behavior: Behavior is slowed and withdrawn. Behavior is cooperative.    ED Results / Procedures / Treatments   Labs (all labs ordered are listed, but only abnormal results are displayed) Labs Reviewed  URINALYSIS, ROUTINE W REFLEX MICROSCOPIC - Abnormal; Notable for the following components:      Result Value   Hgb urine dipstick SMALL (*)    Ketones, ur 5 (*)    Protein, ur 100 (*)    All other components within normal limits  BASIC METABOLIC PANEL - Abnormal; Notable for the following components:   Glucose, Bld 102 (*)    All other components within normal limits  CBC - Abnormal; Notable for the following components:   Hemoglobin 12.3 (*)    HCT 38.7 (*)    All other components within normal limits  BETA-HYDROXYBUTYRIC ACID - Abnormal; Notable for the following components:   Beta-Hydroxybutyric Acid 1.19 (*)    All other components within  normal limits  HEPATIC FUNCTION PANEL - Abnormal; Notable for the following components:   Total Protein 8.5 (*)    Albumin 3.2 (*)    All other components within normal limits  CBG MONITORING, ED - Abnormal; Notable for the following components:   Glucose-Capillary 114 (*)    All other components within normal limits  CULTURE, BLOOD (ROUTINE X 2)  CULTURE, BLOOD (ROUTINE X 2)  LACTIC ACID, PLASMA  MAGNESIUM  TSH  TROPONIN I (HIGH SENSITIVITY)  TROPONIN I (HIGH SENSITIVITY)    EKG EKG Interpretation  Date/Time:  Tuesday Oct 15 2021 15:25:34 EDT Ventricular Rate:  102 PR Interval:  120 QRS Duration: 81 QT Interval:  357 QTC Calculation: 465 R Axis:   87 Text Interpretation: Sinus tachycardia Consider left ventricular hypertrophy Confirmed by Godfrey Pick [phone]) on 10/15/2021 4:16:28 PM  Radiology No results found.  Procedures Procedures    Medications Ordered in ED Medications  lactated ringers bolus 1,000 mL (0 mLs Intravenous Stopped 10/15/21 1809)  lactated ringers bolus 1,000 mL (0 mLs Intravenous Stopped 10/15/21 2255)  ipratropium-albuterol (DUONEB) 0.5-2.5 (3) MG/3ML nebulizer solution 3 mL (3 mLs Nebulization Given 10/15/21 1832)  methylPREDNISolone sodium succinate (SOLU-MEDROL) 125 mg/2 mL injection 125 mg (125 mg Intravenous Given 10/15/21 1810)  metoCLOPramide (REGLAN) injection 10 mg (10 mg Intravenous Given 10/15/21 1828)  iohexol (OMNIPAQUE) 350 MG/ML injection 100 mL (100 mLs Intravenous Contrast Given 10/15/21 2330)    ED Course/ Medical Decision Making/ A&P                           Medical Decision Making Amount and/or Complexity of Data Reviewed Labs: ordered. Radiology: ordered.  Risk Prescription drug management.   This patient presents to the ED for concern of generalized weakness, difficulty urinating, this involves an extensive number of treatment options, and is a complaint that carries with it a high risk  of complications and morbidity.  The  differential diagnosis includes acute urinary tension, dehydration, infection, hyperglycemia, metabolic disturbances, polypharmacy, medication withdrawal, depression   Co morbidities that complicate the patient evaluation  DM, bipolar disorder, interstitial lung disease, chronic hypoxia, atrial fibrillation, HLD, PAD, GERD, obesity   Additional history obtained:  Additional history obtained from patient's brother and mother External records from outside source obtained and reviewed including EMR   Lab Tests:  I Ordered, and personally interpreted labs.  The pertinent results include: No evidence of urine infection, normal electrolytes, normal kidney function, no leukocytosis, baseline anemia, beta hydroxybutyrate mildly elevated, consistent with starvation ketosis   Imaging Studies ordered:  I ordered imaging studies including CTA chest I independently visualized and interpreted imaging which showed chronic interstitial lung disease with some enlarged lymph nodes I agree with the radiologist interpretation   Cardiac Monitoring: / EKG:  The patient was maintained on a cardiac monitor.  I personally viewed and interpreted the cardiac monitored which showed an underlying rhythm of: Sinus rhythm   Problem List / ED Course / Critical interventions / Medication management  Patient presents for failure to thrive and generalized weakness.  This has been ongoing and worsening for the past 2 weeks.  He has had very poor p.o. intake due to loss of appetite and nausea.  He endorses no urine output in the past 3 days.  His initial vital signs are notable for tachycardia, however, he is normotensive and afebrile.  He is chronically ill-appearing on exam.  His abdomen is soft and without tenderness.  On bedside ultrasound, bladder has very low volume.  I suspect his low urine output is secondary to dehydration.  IV fluids were ordered.  Patient was given Reglan for nausea.  On lung auscultation,  patient does have expiratory wheezing.  Solu-Medrol DuoNeb were given with improvement in breathing symptoms.  Patient's lab work was notable only for mildly elevated hydroxybutyrate.  Although he does have diabetes, I suspect that her ketosis is secondary to starvation.  His glucose is normal, as is his bicarb.  He had resolution of his nausea and was able to eat a meal while in the ED.  When he stood up, he had tachycardia and tachypnea.  He was given an additional bolus of IV fluids.  CTA of chest was ordered to assess for possible underlying PE.  Care of patient was signed out to oncoming ED provider. I ordered medication including IV fluids for dehydration; Reglan for nausea; Solu-Medrol and DuoNeb for exacerbation of reactive airway disease Reevaluation of the patient after these medicines showed that the patient improved I have reviewed the patients home medicines and have made adjustments as needed   Social Determinants of Health:  Chronically ill, despite young age         Final Clinical Impression(s) / ED Diagnoses Final diagnoses:  Urinary retention  Dehydration    Rx / DC Orders ED Discharge Orders     None         Godfrey Pick, MD 10/18/21 1246    Godfrey Pick, MD 10/18/21 1247

## 2021-10-15 NOTE — ED Notes (Signed)
Upon assessment patient O2 sats 87%. Patient placed on 2L [place] and EDP made aware.  ?

## 2021-10-16 NOTE — Discharge Instructions (Addendum)
You were evaluated in the Emergency Department and after careful evaluation, we did not find any emergent condition requiring admission or further testing in the hospital. ? ?Your exam/testing today was overall reassuring.  Your urinary issues may be due to an enlarged prostate.  Recommend follow-up with the urologist for further care. ? ?Please return to the Emergency Department if you experience any worsening of your condition.  Thank you for allowing [place] to be a part of your care. ? ?

## 2021-10-16 NOTE — ED Provider Notes (Signed)
?  Provider Note ?MRN:  [id number]  ?Arrival date & time: 10/16/21    ?ED Course and Medical Decision Making  ?Assumed care from Dr. Doren Custard at shift change. ? ?Straining to urinate at home.  Also found to be 87% on room air here in the emergency department.  Awaiting CTA. ? ?CTA is without PE, evidence of chronic lung disease.  Patient has a known history of this, endorses use of 2 L oxygen at home at all times at baseline.  He is currently on his baseline oxygen, remainder of medical work-up reassuring, able to urinate here in the emergency department.  Suspect BPH, no indication for further testing or admission, advised urology follow-up. ? ?Procedures ? ?Final Clinical Impressions(s) / ED Diagnoses  ? ?  ICD-10-CM   ?1. Urinary retention  R33.9   ?  ?  ?ED Discharge Orders   ? ? None  ? ?  ?  ? ? ?Discharge Instructions   ? ?  ?You were evaluated in the Emergency Department and after careful evaluation, we did not find any emergent condition requiring admission or further testing in the hospital. ? ?Your exam/testing today was overall reassuring.  Your urinary issues may be due to an enlarged prostate.  Recommend follow-up with the urologist for further care. ? ?Please return to the Emergency Department if you experience any worsening of your condition.  Thank you for allowing [place] to be a part of your care. ? ? ? ? ?Barth Kirks. Sedonia Small, MD ?[facility] ?[email or web address] ? ?  ?Maudie Flakes, MD ?10/16/21 0024 ? ?

## 2021-10-20 LAB — CULTURE, BLOOD (ROUTINE X 2)
Culture: NO GROWTH
Culture: NO GROWTH

## 2021-10-21 ENCOUNTER — Encounter: Payer: Self-pay | Admitting: "Endocrinology

## 2021-10-21 ENCOUNTER — Ambulatory Visit (INDEPENDENT_AMBULATORY_CARE_PROVIDER_SITE_OTHER): Payer: Medicaid Other | Admitting: "Endocrinology

## 2021-10-21 VITALS — BP 102/70 | HR 96 | Ht 73.0 in | Wt 171.0 lb

## 2021-10-21 DIAGNOSIS — F17200 Nicotine dependence, unspecified, uncomplicated: Secondary | ICD-10-CM | POA: Diagnosis not present

## 2021-10-21 DIAGNOSIS — E1110 Type 2 diabetes mellitus with ketoacidosis without coma: Secondary | ICD-10-CM | POA: Diagnosis not present

## 2021-10-21 DIAGNOSIS — I10 Essential (primary) hypertension: Secondary | ICD-10-CM | POA: Diagnosis not present

## 2021-10-21 LAB — POCT GLYCOSYLATED HEMOGLOBIN (HGB A1C): HbA1c, POC (controlled diabetic range): 8.4 % — AB (ref 0.0–7.0)

## 2021-10-21 MED ORDER — NICOTINE 7 MG/24HR TD PT24: 7.0000 mg | MEDICATED_PATCH | Freq: Every day | TRANSDERMAL | 2 refills | Status: AC

## 2021-10-21 MED ORDER — LANTUS SOLOSTAR 100 UNIT/ML ~~LOC~~ SOPN: 20.0000 [IU] | PEN_INJECTOR | Freq: Every day | SUBCUTANEOUS | 1 refills | Status: AC

## 2021-10-21 NOTE — Progress Notes (Signed)
10/21/2021, 3:34 PM   Endocrinology follow-up note  Subjective:    Patient ID: [name], male    DOB: 08-04-1975.  [name] is being seen in follow-up after he was seen in consultation for management of currently uncontrolled symptomatic diabetes requested by  Jani Gravel, MD.   Past Medical History:  Diagnosis Date   Bipolar disorder [facility])    Chronic respiratory failure ([place])    Erectile dysfunction    Essential hypertension    GERD (gastroesophageal reflux disease)    History of pneumonia    Hyperlipidemia    Interstitial lung disease ([place])    Pulmonary eosinophilia   Obesity    Pulmonary eosinophilia ([place])    Sinus tachycardia    Tobacco abuse    Type 2 diabetes mellitus ([place])     Past Surgical History:  Procedure Laterality Date   APPENDECTOMY     BIOPSY  01/15/2021   Procedure: BIOPSY;  Surgeon: Harvel Quale, MD;  [facility];  Service: Gastroenterology;;   CHOLECYSTECTOMY N/A 07/11/2013   Procedure: LAPAROSCOPIC CHOLECYSTECTOMY;  Surgeon: Jamesetta So, MD;  Location: [facility];  Service: General;  Laterality: N/A;   COLONOSCOPY WITH PROPOFOL N/A 01/15/2021   Procedure: COLONOSCOPY WITH PROPOFOL;  Surgeon: Harvel Quale, MD;  Location[facility];  Service: Gastroenterology;  Laterality: N/A;  10:40   ESOPHAGOGASTRODUODENOSCOPY (EGD) WITH PROPOFOL N/A 01/15/2021   Procedure: ESOPHAGOGASTRODUODENOSCOPY (EGD) WITH PROPOFOL;  Surgeon: Harvel Quale, MD;  [facility];  Service: Gastroenterology;  Laterality: N/A;   INCISION AND DRAINAGE ABSCESS / HEMATOMA OF BURSA / KNEE / THIGH     KNEE SURGERY Bilateral    POLYPECTOMY  01/15/2021   Procedure: POLYPECTOMY;  Surgeon: Harvel Quale, MD;  [facility];  Service: Gastroenterology;;   VIDEO BRONCHOSCOPY Bilateral 02/09/2018   Procedure:  VIDEO BRONCHOSCOPY WITHOUT FLUORO;  Surgeon: Brand Males, MD;  Location: [facility];  Service: Cardiopulmonary;  Laterality: Bilateral;    Social History   Socioeconomic History   Marital status: Single    Spouse name: Not on file   Number of children: 0   Years of education: Not on file   Highest education level: Not on file  Occupational History   Not on file  Tobacco Use   Smoking status: Some Days    Packs/day: 0.25    Years: 10.00    Pack years: 2.50    Types: Cigarettes   Smokeless tobacco: Never   Tobacco comments:    smokes 1/2 pack per week 01/21/21 [facility]  Vaping Use   Vaping Use: Never used  Substance and Sexual Activity   Alcohol use: No   Drug use: No   Sexual activity: Never  Other Topics Concern   Not on file  Social History Narrative   Not on file   Social Determinants of Health   Financial Resource Strain: Not on file  Food Insecurity: Not on file  Transportation Needs: Not on file  Physical Activity: Not on file  Stress: Not on file  Social Connections: Not on file  Family History  Problem Relation Age of Onset   Diabetes Mother    Diabetes Maternal Grandmother    CAD Maternal Grandmother        heart attack in her 52s   Pancreatitis Neg Hx    Colon cancer Neg Hx     Outpatient Encounter Medications as of 10/21/2021  Medication Sig   nicotine (NICODERM CQ) 7 mg/24hr patch Place 1 patch (7 mg total) onto the skin daily.   Accu-Chek FastClix Lancets MISC USE TO TEST BLOOD SUGAR FOUR TIMES DAILY.   ACCU-CHEK GUIDE test strip USE TO CHECK BLOOD SUGAR FOUR TIMES DAILY   acetaminophen (TYLENOL) 500 MG tablet Take 500-1,000 mg by mouth See admin instructions. Take 1-2 tablets (500-1,000 mg) by mouth every 6 hours as needed for pain & take 2 tablets (1000 mg) by mouth SCHEDULED at bedtime.   albuterol (VENTOLIN HFA) 108 (90 Base) MCG/ACT inhaler Inhale 2 puffs into the lungs every 6 (six) hours as needed. (Patient taking differently: Inhale 2  puffs into the lungs every 6 (six) hours as needed for shortness of breath or wheezing.)   aspirin EC 81 MG EC tablet Take 1 tablet (81 mg total) by mouth daily with breakfast.   BD VEO INSULIN SYRINGE U/F 31G X 15/64" 1 ML MISC USE AS DIRECTED PER PROVIDER TO INJECT INSULIN DAILY.   chlorthalidone (HYGROTON) 25 MG tablet Take 25 mg by mouth at bedtime.   divalproex (DEPAKOTE) 500 MG DR tablet Take 2 tablets (1,000 mg total) by mouth at bedtime.   ergocalciferol (VITAMIN D2) 1.25 MG (50000 UT) capsule Take 50,000 Units by mouth once a week.   fenofibrate (TRICOR) 145 MG tablet Take 145 mg by mouth at bedtime.   ferrous sulfate 325 (65 FE) MG tablet Take 325 mg by mouth daily with breakfast.   furosemide (LASIX) 40 MG tablet Take 40 mg by mouth 2 (two) times daily.   insulin glargine (LANTUS SOLOSTAR) 100 UNIT/ML Solostar Pen Inject 20 Units into the skin at bedtime.   Insulin Pen Needle (SURE COMFORT PEN NEEDLES) 31G X 8 MM MISC USE TO INJECT INSULIN FOUR TIMES DAILY.   Insulin Syringe-Needle U-100 (SURE COMFORT INSULIN SYRINGE) 30G X 1/2" 1 ML MISC USE AS DIRECTED PER PROVIDER TO INJECT INSULIN DAILY.   lisinopril (ZESTRIL) 2.5 MG tablet Take by mouth.   metoCLOPramide (REGLAN) 10 MG tablet Take 10 mg by mouth 4 (four) times daily.   midodrine (PROAMATINE) 5 MG tablet Take 1 tablet (5 mg total) by mouth 3 (three) times daily with meals.   Multiple Vitamin (MULTIVITAMIN WITH MINERALS) TABS tablet Take 1 tablet by mouth in the morning. One-A-Day Men's Multivitamin   omeprazole (PRILOSEC) 20 MG capsule Take 20 mg by mouth at bedtime.   predniSONE (DELTASONE) 5 MG tablet Take 1 tablet (5 mg total) by mouth daily with breakfast.   risperidone (RISPERDAL) 4 MG tablet Take 4 mg by mouth at bedtime.    simvastatin (ZOCOR) 20 MG tablet Take 20 mg by mouth at bedtime.   solifenacin (VESICARE) 5 MG tablet Take 5 mg by mouth at bedtime.    SYMBICORT 160-4.5 MCG/ACT inhaler Inhale 2 puffs into the lungs 2  (two) times daily.   [DISCONTINUED] LANTUS SOLOSTAR 100 UNIT/ML Solostar Pen INJECT 60 UNITS INTO THE SKIN AT BEDTIME. (Patient not taking: Reported on 10/15/2021)   [DISCONTINUED] NOVOLOG FLEXPEN 100 UNIT/ML FlexPen INJECT 18 UNITS INTO THE SKIN THREE TIMES DAILY WITH MEALS (Patient not taking: Reported on 10/15/2021)  No facility-administered encounter medications on file as of 10/21/2021.    ALLERGIES: No Known Allergies  VACCINATION STATUS: Immunization History  Administered Date(s) Administered   Influenza,inj,Quad PF,6+ Mos 06/06/2013, 05/01/2021   Moderna Sars-Covid-2 Vaccination 08/13/2019, 09/14/2019    Diabetes He presents for his follow-up diabetic visit. He has type 2 diabetes mellitus. Onset time: He was diagnosed at approximate age of 43 years. His disease course has been worsening (He weighed approximately 270 pounds during the time of his diagnosis.  He has chronic uncontrolled diabetes with previous episodes of impending diabetic ketoacidosis, hyperosmolar state. ). There are no hypoglycemic associated symptoms. Pertinent negatives for hypoglycemia include no confusion, headaches, pallor or seizures. Associated symptoms include blurred vision, fatigue and weight loss. Pertinent negatives for diabetes include no chest pain, no polydipsia, no polyphagia, no polyuria and no weakness. (He recently had pancreatitis which caused unintended weight loss.  He continued to lose weight unintentionally.) There are no hypoglycemic complications. Symptoms are worsening. Diabetic complications include peripheral neuropathy and PVD. Risk factors for coronary artery disease include diabetes mellitus, dyslipidemia, family history, male sex, hypertension, sedentary lifestyle and tobacco exposure. Current diabetic treatment includes insulin injections. His weight is increasing steadily. He is following a generally unhealthy diet. When asked about meal planning, he reported none. He has not had a  previous visit with a dietitian. He never participates in exercise. His home blood glucose trend is decreasing steadily. His breakfast blood glucose range is generally >200 mg/dl. His lunch blood glucose range is generally >200 mg/dl. His dinner blood glucose range is generally >200 mg/dl. His bedtime blood glucose range is generally >200 mg/dl. His overall blood glucose range is >200 mg/dl. ( [name] returns for follow-up with his meter.  He was taken off of insulin due to unintended weight loss of 19 pounds since last visit.  His average blood glucose is 225 for the last 7 days, 199 for the last 14 days.  He did not have hypoglycemia.  He is currently on liquid diet mainly on Glucerna due to intolerance of solid foods.  ) An ACE inhibitor/angiotensin II receptor blocker is being taken. Eye exam is current.  Hypertension This is a chronic problem. The problem is controlled. Associated symptoms include blurred vision. Pertinent negatives include no chest pain, headaches, neck pain, palpitations or shortness of breath. Risk factors for coronary artery disease include dyslipidemia, diabetes mellitus, male gender, sedentary lifestyle and smoking/tobacco exposure. Past treatments include ACE inhibitors. Hypertensive end-organ damage includes PVD.  Hyperlipidemia This is a chronic problem. The current episode started more than 1 year ago. The problem is uncontrolled. Exacerbating diseases include diabetes. Associated symptoms include myalgias. Pertinent negatives include no chest pain or shortness of breath. Current antihyperlipidemic treatment includes statins. Risk factors for coronary artery disease include diabetes mellitus, dyslipidemia, family history, male sex, hypertension and a sedentary lifestyle.    Review of Systems  Constitutional:  Positive for fatigue and weight loss. Negative for chills, fever and unexpected weight change.  HENT:  Negative for dental problem, mouth sores and trouble  swallowing.   Eyes:  Positive for blurred vision. Negative for visual disturbance.  Respiratory:  Negative for cough, choking, chest tightness, shortness of breath and wheezing.   Cardiovascular:  Negative for chest pain, palpitations and leg swelling.  Gastrointestinal:  Negative for abdominal distention, abdominal pain, constipation, diarrhea, nausea and vomiting.  Endocrine: Negative for polydipsia, polyphagia and polyuria.  Genitourinary:  Negative for dysuria, flank pain, hematuria and urgency.  Musculoskeletal:  Positive  for myalgias. Negative for back pain, gait problem and neck pain.  Skin:  Negative for pallor, rash and wound.  Neurological:  Negative for seizures, syncope, weakness, numbness and headaches.  Psychiatric/Behavioral:  Negative for confusion and dysphoric mood.    Objective:       10/21/2021    1:37 PM 10/15/2021   11:30 PM 10/15/2021   11:00 PM  Vitals with BMI  Height '6\' 1"'$     Weight 171 lbs    BMI 93.71    Systolic 696 789 381  Diastolic 70 93 97  Pulse 96 93 100    BP 102/70   Pulse 96   Ht '6\' 1"'$  (1.854 m)   Wt 171 lb (77.6 kg)   BMI 22.56 kg/m   Wt Readings from Last 3 Encounters:  10/21/21 171 lb (77.6 kg)  10/15/21 171 lb (77.6 kg)  10/07/21 176 lb 4.8 oz (80 kg)      CMP     Component Value Date/Time   NA 139 10/15/2021 1223   NA 135 07/17/2021 1026   K 3.6 10/15/2021 1223   CL 106 10/15/2021 1223   CO2 26 10/15/2021 1223   GLUCOSE 102 (H) 10/15/2021 1223   BUN 12 10/15/2021 1223   BUN 18 07/17/2021 1026   CREATININE 1.02 10/15/2021 1223   CREATININE 0.67 07/18/2019 1422   CALCIUM 9.1 10/15/2021 1223   PROT 8.5 (H) 10/15/2021 1601   PROT 8.5 07/17/2021 1026   ALBUMIN 3.2 (L) 10/15/2021 1601   ALBUMIN 3.8 (L) 07/17/2021 1026   AST 19 10/15/2021 1601   ALT 9 10/15/2021 1601   ALKPHOS 68 10/15/2021 1601   BILITOT 0.5 10/15/2021 1601   BILITOT 0.2 07/17/2021 1026   GFRNONAA >60 10/15/2021 1223   GFRAA 125 06/05/2020 1046      Diabetic Labs (most recent): Lab Results  Component Value Date   HGBA1C 8.4 (A) 10/21/2021   HGBA1C 9.0 (A) 07/22/2021   HGBA1C 10.6 04/18/2021     Lipid Panel ( most recent) Lipid Panel     Component Value Date/Time   CHOL 113 07/17/2021 1026   TRIG 156 (H) 07/17/2021 1026   HDL 37 (L) 07/17/2021 1026   CHOLHDL 3.1 07/17/2021 1026   LDLCALC 49 07/17/2021 1026   LABVLDL 27 07/17/2021 1026      Lab Results  Component Value Date   TSH 1.517 10/15/2021   TSH 1.303 11/02/2020   TSH 3.360 06/05/2020   TSH 1.111 12/26/2019   TSH 1.36 01/26/2019   TSH 1.78 10/13/2016   FREET4 0.98 11/02/2020   FREET4 1.06 06/05/2020   FREET4 0.86 01/26/2019      Assessment & Plan:   1. DM (diabetes mellitus) type 2, uncontrolled, with ketoacidosis (HCC)   - [name] has currently uncontrolled symptomatic type 2 DM since  46 years of age.   [name] returns for follow-up with his meter.  He was taken off of insulin due to unintended weight loss of 19 pounds since last visit.  His average blood glucose is 225 for the last 7 days, 199 for the last 14 days.  He did not have hypoglycemia.  He is currently on liquid diet mainly on Glucerna due to GI intolerance of solid foods.   His A1c was 12.7% in March 2021.    - Recent labs reviewed. - I had a long discussion with him about the progressive nature of diabetes and the pathology behind its complications. -his diabetes is complicated by  peripheral arterial disease, chronic smoking, pancreatitis, and he remains at extremely high risk  for more acute and chronic complications which include CAD, CVA, CKD, retinopathy, and neuropathy. These are all discussed in detail with him.  - I have counseled him on diet  and weight management  by adopting a carbohydrate restricted/protein rich diet. Patient is encouraged to switch to  unprocessed or minimally processed     complex starch and increased protein intake (animal or plant  source), fruits, and vegetables. -  he is advised to stick to a routine mealtimes to eat 3 meals  a day and avoid unnecessary snacks ( to snack only to correct hypoglycemia).   - he acknowledges that there is a room for improvement in his food and drink choices. - Suggestion is made for him to avoid simple carbohydrates  from his diet including Cakes, Sweet Desserts, Ice Cream, Soda (diet and regular), Sweet Tea, Candies, Chips, Cookies, Store Bought Juices, Alcohol in Excess of  1-2 drinks a day, Artificial Sweeteners,  Coffee Creamer, and "Sugar-free" Products, Lemonade. This will help patient to have more stable blood glucose profile and potentially avoid unintended weight gain.  - he has been scheduled with Jearld Fenton, RDN, CDE for diabetes education.  - I have approached him with the following individualized plan to manage  his diabetes and patient agrees:   -In light of his presentation with chronically uncontrolled type 2 diabetes with chronic hyperglycemic burden, hypertriglyceridemia, he will continue to need at least basal insulin in order for him to achieve control of diabetes to target.    -His history is suggestive of type 2 diabetes rather than type 1, will be considered for work-up for proper classification on subsequent visits.    -In the meantime, he is advised to resume Lantus at a lower dose of 20 units nightly, hold off NovoLog for now until next visit, continue to monitor blood glucose 4 times a day--before meals and at bedtime. - he is warned not to take insulin without proper monitoring per orders.  - he is encouraged to call clinic for blood glucose levels less than 70 or above 300 mg /dl. -He will be treated exclusively with insulin at this time.     - he is not a candidate for SGLT2 inhibitors nor incretin therapy due to his body habitus and recent pancreatitis.   He remains a very heavy chronic smoker.   - Specific targets for  A1c;  LDL, HDL,  and Triglycerides  were discussed with the patient.  2) Blood Pressure /Hypertension:  -His blood pressure is controlled to target.  He is advised to discontinue lisinopril and metoprolol.  Patient is symptomatic of lightheadedness at times.  He is advised to measure blood pressure at home and report if readings are greater than 140/90.     3) Lipids/Hyperlipidemia:   His previsit lipid panel showed improved LDL at 72, hypertriglyceridemia at 178.  He will continue to benefit from statin intervention.  He is advised to continue pravastatin 20 mg p.o. nightly.  Side effects precautions discussed with him.    4)  Weight/Diet:  Body mass index is 22.56 kg/m.  - he is not a candidate for weight loss.  He is losing weight unintentionally.   Current explanation for his weight loss is inadequate oral intake.  He is advised to increase his Glucerna to a can 3 times daily AC to supplement his oral intake. -This patient may need Creon intervention if he continues to lose  weight given his history of pancreatitis.  5) Chronic Care/Health Maintenance:  -he  is on ACEI/ARB and Statin medications and  is encouraged to initiate and continue to follow up with Ophthalmology, Dentist,  Podiatrist at least yearly or according to recommendations, and advised to  quit smoking. I have recommended yearly flu vaccine and pneumonia vaccine at least every 5 years; moderate intensity exercise for up to 150 minutes weekly; and  sleep for at least 7 hours a day.  The patient was counseled on the dangers of tobacco use, and was advised to quit.  Reviewed strategies to maximize success, including removing cigarettes and smoking materials from environment. I discussed and ordered NicoDerm 7 mg/patch every day.  He was evaluated by vascular surgery, further work-up did not show significant peripheral arterial disease.   - he is  advised to maintain close follow up with Jani Gravel, MD for primary care needs, as well as his other providers for  optimal and coordinated care.   I spent 32 minutes in the care of the patient today including review of labs from [facility], Lipids, Thyroid Function, Hematology (current and previous including abstractions from other facilities); face-to-face time discussing  his blood glucose readings/logs, discussing hypoglycemia and hyperglycemia episodes and symptoms, medications doses, his options of short and long term treatment based on the latest standards of care / guidelines;  discussion about incorporating lifestyle medicine;  and documenting the encounter.    Please refer to Patient Instructions for Blood Glucose Monitoring and Insulin/Medications Dosing Guide"  in media tab for additional information. Please  also refer to " Patient Self Inventory" in the Media  tab for reviewed elements of pertinent patient history.  [name] participated in the discussions, expressed understanding, and voiced agreement with the above plans.  All questions were answered to his satisfaction. he is encouraged to contact clinic should he have any questions or concerns prior to his return visit.   Follow up plan: - Return in about 4 weeks (around 11/18/2021) for F/U with Meter/CGM [facility] Only - no Labs.  Omar Lloyd, MD [facility] [address] [place], [place] [address] Phone: [phone]  Fax: [phone]    10/21/2021, 3:34 PM  This note was partially dictated with voice recognition software. Similar sounding words can be transcribed inadequately or may not  be corrected upon review.

## 2021-10-22 ENCOUNTER — Encounter: Payer: Self-pay | Admitting: Urology

## 2021-10-22 ENCOUNTER — Ambulatory Visit (INDEPENDENT_AMBULATORY_CARE_PROVIDER_SITE_OTHER): Payer: Medicaid Other | Admitting: Urology

## 2021-10-22 VITALS — BP 112/79 | HR 114 | Ht 73.0 in | Wt 171.0 lb

## 2021-10-22 DIAGNOSIS — R339 Retention of urine, unspecified: Secondary | ICD-10-CM | POA: Diagnosis not present

## 2021-10-22 DIAGNOSIS — R39198 Other difficulties with micturition: Secondary | ICD-10-CM

## 2021-10-22 LAB — MICROSCOPIC EXAMINATION
Bacteria, UA: NONE SEEN
RBC, Urine: NONE SEEN /hpf (ref 0–2)
Renal Epithel, UA: NONE SEEN /hpf
WBC, UA: NONE SEEN /hpf (ref 0–5)

## 2021-10-22 LAB — URINALYSIS, ROUTINE W REFLEX MICROSCOPIC
Bilirubin, UA: NEGATIVE
Leukocytes,UA: NEGATIVE
Nitrite, UA: NEGATIVE
RBC, UA: NEGATIVE
Specific Gravity, UA: 1.02 (ref 1.005–1.030)
Urobilinogen, Ur: 2 mg/dL — ABNORMAL HIGH (ref 0.2–1.0)
pH, UA: 7 (ref 5.0–7.5)

## 2021-10-22 LAB — BLADDER SCAN AMB NON-IMAGING: Scan Result: 31

## 2021-10-22 NOTE — Progress Notes (Signed)
post void residual =78m

## 2021-10-22 NOTE — Progress Notes (Signed)
Assessment: 1. Difficulty voiding     Plan: I reviewed the records from his recent ER visit including lab results and recent imaging studies. He is emptying his bladder well today.  I think his recent symptoms were related to dehydration. I discussed a trial off of the Solifenacin to see if this is still required.  He will try stopping this to see if his urinary symptoms worsen and restarting if needed. Return to office as needed.  Chief Complaint:  Chief Complaint  Patient presents with   Urinary Retention    History of Present Illness:  [name] is a 46 y.o. year old male who is seen in consultation from Jani Gravel, MD for evaluation of difficulty voiding.  He was seen in the emergency room on 10/15/2021 reporting the inability to void for 3-4 days.  He reported decreased p.o. intake, nausea, vomiting, and increased fatigue.  Bladder scan demonstrated 134 mL in the bladder.  He was eventually able to void while in the emergency department.  Creatinine was normal at 1.02.  Urinalysis demonstrated 0-5 RBCs, 0-5 WBCs, no bacteria.  He was treated with IV fluids.  His voiding symptoms have improved since that time.  He reports that he is currently voiding 3 times per day.  He reports occasional straining to void.  No dysuria or gross hematuria.  No history of UTIs.  He has been on Solifenacin 5 mg nightly for approximately 2 years.  This was started for treatment of overactive bladder symptoms.  He reports a 40 pound weight loss over the past several months.  He is undergoing GI evaluation. CT imaging from 09/22/2021 demonstrated no renal abnormalities, no stones or hydronephrosis, and normal-appearing bladder.  Past Medical History:  Past Medical History:  Diagnosis Date   Bipolar disorder ([place])    Chronic respiratory failure ([place])    Erectile dysfunction    Essential hypertension    GERD (gastroesophageal reflux disease)    History of pneumonia    Hyperlipidemia     Interstitial lung disease ([place])    Pulmonary eosinophilia   Obesity    Pulmonary eosinophilia ([facility])    Sinus tachycardia    Tobacco abuse    Type 2 diabetes mellitus ([place])     Past Surgical History:  Past Surgical History:  Procedure Laterality Date   APPENDECTOMY     BIOPSY  01/15/2021   Procedure: BIOPSY;  Surgeon: Harvel Quale, MD;  [facility]: [facility];  Service: Gastroenterology;;   CHOLECYSTECTOMY N/A 07/11/2013   Procedure: LAPAROSCOPIC CHOLECYSTECTOMY;  Surgeon: Jamesetta So, MD;  Location: [facility];  Service: General;  Laterality: N/A;   COLONOSCOPY WITH PROPOFOL N/A 01/15/2021   Procedure: COLONOSCOPY WITH PROPOFOL;  Surgeon: Harvel Quale, MD;  Location: [facility];  Service: Gastroenterology;  Laterality: N/A;  10:40   ESOPHAGOGASTRODUODENOSCOPY (EGD) WITH PROPOFOL N/A 01/15/2021   Procedure: ESOPHAGOGASTRODUODENOSCOPY (EGD) WITH PROPOFOL;  Surgeon: Harvel Quale, MD;  [facility];  Service: Gastroenterology;  Laterality: N/A;   INCISION AND DRAINAGE ABSCESS / HEMATOMA OF BURSA / KNEE / THIGH     KNEE SURGERY Bilateral    POLYPECTOMY  01/15/2021   Procedure: POLYPECTOMY;  Surgeon: Harvel Quale, MD;  [facility]: [facility];  Service: Gastroenterology;;   VIDEO BRONCHOSCOPY Bilateral 02/09/2018   Procedure: VIDEO BRONCHOSCOPY WITHOUT FLUORO;  Surgeon: Brand Males, MD;  Location: [facility];  Service: Cardiopulmonary;  Laterality: Bilateral;    Allergies:  No Known Allergies  Family  History:  Family History  Problem Relation Age of Onset   Diabetes Mother    Diabetes Maternal Grandmother    CAD Maternal Grandmother        heart attack in her 80s   Pancreatitis Neg Hx    Colon cancer Neg Hx     Social History:  Social History   Tobacco Use   Smoking status: Some Days    Packs/day: 0.25    Years: 10.00    Pack years: 2.50    Types: Cigarettes   Smokeless tobacco: Never   Tobacco comments:     smokes 1/2 pack per week 01/21/21 [facility]  Vaping Use   Vaping Use: Never used  Substance Use Topics   Alcohol use: No   Drug use: No    Review of symptoms:  Constitutional:  Negative for unexplained night sweats, fever, chills ENT:  Negative for nose bleeds, sinus pain, painful swallowing CV:  Negative for chest pain, shortness of breath, exercise intolerance, palpitations, loss of consciousness Resp:  Negative for cough, wheezing, shortness of breath GI:  Negative for diarrhea, bloody stools GU:  Positives noted in HPI; otherwise negative for gross hematuria, dysuria, urinary incontinence Neuro:  Negative for seizures, poor balance, limb weakness, slurred speech Psych:  Negative for lack of energy, depression, anxiety Endocrine:  Negative for polydipsia, polyuria, symptoms of hypoglycemia (dizziness, hunger, sweating) Hematologic:  Negative for anemia, purpura, petechia, prolonged or excessive bleeding, use of anticoagulants  Allergic:  Negative for difficulty breathing or choking as a result of exposure to anything; no shellfish allergy; no allergic response (rash/itch) to materials, foods  Physical exam: BP 112/79   Pulse (!) 114   Ht '6\' 1"'$  (1.854 m)   Wt 171 lb (77.6 kg)   BMI 22.56 kg/m  GENERAL APPEARANCE: Chronically ill appearing, thin male, NAD HEENT: Atraumatic, Normocephalic, oropharynx clear. NECK: Supple without lymphadenopathy or thyromegaly. LUNGS: Clear to auscultation bilaterally. HEART: Regular Rate and Rhythm without murmurs, gallops, or rubs. ABDOMEN: Soft, non-tender, No Masses. EXTREMITIES: Moves all extremities well.  Without clubbing, cyanosis, or edema. NEUROLOGIC:  Alert and oriented x 3, normal gait, CN II-XII grossly intact.  MENTAL STATUS:  Appropriate. BACK:  Non-tender to palpation.  No CVAT SKIN:  Warm, dry and intact.  GU: Penis:  circumcised Meatus: Normal Scrotum: normal, no masses Testis: normal without masses bilateral Epididymis:  normal Prostate: 20 g, nontender, no nodules Rectum: Normal tone,  no masses or tenderness    Results: U/A:  3+ glucose, 1+ protein  PVR = 31 mL

## 2021-10-29 ENCOUNTER — Other Ambulatory Visit (INDEPENDENT_AMBULATORY_CARE_PROVIDER_SITE_OTHER): Payer: Self-pay

## 2021-10-29 DIAGNOSIS — I951 Orthostatic hypotension: Secondary | ICD-10-CM

## 2021-11-04 ENCOUNTER — Other Ambulatory Visit (HOSPITAL_COMMUNITY)
Admission: RE | Admit: 2021-11-04 | Discharge: 2021-11-04 | Disposition: A | Discharge status: Home / Self Care | Payer: Medicaid Other | Source: Ambulatory Visit | Attending: Gastroenterology | Admitting: Gastroenterology

## 2021-11-04 DIAGNOSIS — I951 Orthostatic hypotension: Secondary | ICD-10-CM | POA: Diagnosis present

## 2021-11-04 LAB — BASIC METABOLIC PANEL
Anion gap: 5 (ref 5–15)
BUN: 18 mg/dL (ref 6–20)
CO2: 27 mmol/L (ref 22–32)
Calcium: 8.9 mg/dL (ref 8.9–10.3)
Chloride: 102 mmol/L (ref 98–111)
Creatinine, Ser: 0.94 mg/dL (ref 0.61–1.24)
GFR, Estimated: >60 mL/min (ref >60)
Glucose, Bld: 326 mg/dL — ABNORMAL HIGH (ref 70–99)
Potassium: 4 mmol/L (ref 3.5–5.1)
Sodium: 134 mmol/L — ABNORMAL LOW (ref 135–145)

## 2021-11-05 NOTE — Progress Notes (Signed)
Dr. Briant Cedar notified of glucose 326. Will check accu check on arrival in the am.

## 2021-11-06 ENCOUNTER — Ambulatory Visit (HOSPITAL_COMMUNITY): Payer: Medicaid Other | Admitting: Certified Registered Nurse Anesthetist

## 2021-11-06 ENCOUNTER — Other Ambulatory Visit: Payer: Self-pay

## 2021-11-06 ENCOUNTER — Encounter (HOSPITAL_COMMUNITY): Payer: Self-pay | Admitting: Gastroenterology

## 2021-11-06 ENCOUNTER — Ambulatory Visit (HOSPITAL_BASED_OUTPATIENT_CLINIC_OR_DEPARTMENT_OTHER): Payer: Medicaid Other | Admitting: Certified Registered Nurse Anesthetist

## 2021-11-06 ENCOUNTER — Ambulatory Visit (HOSPITAL_COMMUNITY)
Admission: RE | Admit: 2021-11-06 | Discharge: 2021-11-06 | Disposition: A | Discharge status: Home / Self Care | Payer: Medicaid Other | Attending: Gastroenterology | Admitting: Gastroenterology

## 2021-11-06 ENCOUNTER — Encounter (HOSPITAL_COMMUNITY)
Admission: RE | Disposition: A | Discharge status: Home / Self Care | Payer: Self-pay | Source: Home / Self Care | Attending: Gastroenterology

## 2021-11-06 DIAGNOSIS — N289 Disorder of kidney and ureter, unspecified: Secondary | ICD-10-CM | POA: Insufficient documentation

## 2021-11-06 DIAGNOSIS — E785 Hyperlipidemia, unspecified: Secondary | ICD-10-CM | POA: Insufficient documentation

## 2021-11-06 DIAGNOSIS — D509 Iron deficiency anemia, unspecified: Secondary | ICD-10-CM | POA: Insufficient documentation

## 2021-11-06 DIAGNOSIS — K449 Diaphragmatic hernia without obstruction or gangrene: Secondary | ICD-10-CM | POA: Diagnosis not present

## 2021-11-06 DIAGNOSIS — F17210 Nicotine dependence, cigarettes, uncomplicated: Secondary | ICD-10-CM | POA: Diagnosis not present

## 2021-11-06 DIAGNOSIS — R1310 Dysphagia, unspecified: Secondary | ICD-10-CM | POA: Insufficient documentation

## 2021-11-06 DIAGNOSIS — F319 Bipolar disorder, unspecified: Secondary | ICD-10-CM | POA: Diagnosis not present

## 2021-11-06 DIAGNOSIS — J449 Chronic obstructive pulmonary disease, unspecified: Secondary | ICD-10-CM

## 2021-11-06 DIAGNOSIS — K219 Gastro-esophageal reflux disease without esophagitis: Secondary | ICD-10-CM | POA: Insufficient documentation

## 2021-11-06 DIAGNOSIS — I10 Essential (primary) hypertension: Secondary | ICD-10-CM

## 2021-11-06 DIAGNOSIS — E119 Type 2 diabetes mellitus without complications: Secondary | ICD-10-CM | POA: Diagnosis not present

## 2021-11-06 DIAGNOSIS — D759 Disease of blood and blood-forming organs, unspecified: Secondary | ICD-10-CM | POA: Insufficient documentation

## 2021-11-06 DIAGNOSIS — Z9981 Dependence on supplemental oxygen: Secondary | ICD-10-CM | POA: Diagnosis not present

## 2021-11-06 DIAGNOSIS — R590 Localized enlarged lymph nodes: Secondary | ICD-10-CM | POA: Insufficient documentation

## 2021-11-06 DIAGNOSIS — J849 Interstitial pulmonary disease, unspecified: Secondary | ICD-10-CM | POA: Diagnosis not present

## 2021-11-06 DIAGNOSIS — J479 Bronchiectasis, uncomplicated: Secondary | ICD-10-CM | POA: Insufficient documentation

## 2021-11-06 HISTORY — PX: ESOPHAGOGASTRODUODENOSCOPY (EGD) WITH PROPOFOL: SHX5813

## 2021-11-06 HISTORY — PX: BIOPSY: SHX5522

## 2021-11-06 LAB — GLUCOSE, CAPILLARY
Glucose-Capillary: 251 mg/dL — ABNORMAL HIGH (ref 70–99)
Glucose-Capillary: 226 mg/dL — ABNORMAL HIGH (ref 70–99)

## 2021-11-06 SURGERY — ESOPHAGOGASTRODUODENOSCOPY (EGD) WITH PROPOFOL
Anesthesia: General

## 2021-11-06 MED ORDER — ETOMIDATE 2 MG/ML IV SOLN
INTRAVENOUS | Status: DC | PRN
  Administered 2021-11-06: 6 mg via INTRAVENOUS

## 2021-11-06 MED ORDER — PROPOFOL 500 MG/50ML IV EMUL
INTRAVENOUS | Status: DC | PRN
  Administered 2021-11-06: 180 ug/kg/min via INTRAVENOUS

## 2021-11-06 MED ORDER — ETOMIDATE 2 MG/ML IV SOLN
INTRAVENOUS | Status: AC
  Filled 2021-11-06: qty 10

## 2021-11-06 MED ORDER — ALBUTEROL SULFATE (2.5 MG/3ML) 0.083% IN NEBU
INHALATION_SOLUTION | RESPIRATORY_TRACT | Status: AC
  Filled 2021-11-06: qty 3

## 2021-11-06 MED ORDER — LACTATED RINGERS IV SOLN: INTRAVENOUS | Status: DC

## 2021-11-06 MED ORDER — PROPOFOL 500 MG/50ML IV EMUL
INTRAVENOUS | Status: AC
  Filled 2021-11-06: qty 50

## 2021-11-06 MED ORDER — PROPOFOL 10 MG/ML IV BOLUS
INTRAVENOUS | Status: DC | PRN
  Administered 2021-11-06: 50 mg via INTRAVENOUS

## 2021-11-06 NOTE — Op Note (Signed)
[facility] Patient Name: [name] Procedure Date: 11/06/2021 12:53 PM MRN: [id number] Date of Birth: Oct 08, 1975 Attending MD: [name] ,  CSN: [id number] Age: 46 Admit Type: Outpatient Procedure:                Upper GI endoscopy Indications:              Dysphagia Providers:                [name], Lurline Del, RN, Raphael Gibney,                            Technician Referring MD:              Medicines:                Monitored Anesthesia Care Complications:            No immediate complications. Estimated Blood Loss:     Estimated blood loss: none. Procedure:                Pre-Anesthesia Assessment:                           - Prior to the procedure, a History and Physical                            was performed, and patient medications, allergies                            and sensitivities were reviewed. The patient's                            tolerance of previous anesthesia was reviewed.                           - The risks and benefits of the procedure and the                            sedation options and risks were discussed with the                            patient. All questions were answered and informed                            consent was obtained.                           - ASA Grade Assessment: II - A patient with mild                            systemic disease.                           After obtaining informed consent, the endoscope was                            passed under direct vision. Throughout the  procedure, the patient's blood pressure, pulse, and                            oxygen saturations were monitored continuously. The                            [id number]-H190 ([id number]) scope was introduced through the                            mouth, and advanced to the second part of duodenum.                            The upper GI endoscopy was accomplished without                             difficulty. The patient tolerated the procedure                            well. Scope In: 1:01:47 PM Scope Out: 1:15:30 PM Total Procedure Duration: 0 hours 13 minutes 43 seconds  Findings:      No endoscopic abnormality was evident in the esophagus to explain the       patient's complaint of dysphagia. It was decided, however, to proceed       with dilation of the entire esophagus. A guidewire was placed and the       scope was withdrawn. Dilation was performed with a Savary dilator with       no resistance at 18 mm. No mucosal disruption was seen upon       reinspection. Biopsies were obtained from the proximal and distal       esophagus with cold forceps for histology.      A sliding 1 cm hiatal hernia was present. This was a Hill Grade 1 hernia.      The entire examined stomach was normal.      The examined duodenum was normal. Impression:               - No endoscopic esophageal abnormality to explain                            patient's dysphagia. Esophagus dilated. Biopsied.                           - Sliding 1 cm hiatal hernia.                           - Normal stomach.                           - Normal examined duodenum. Moderate Sedation:      Per Anesthesia Care Recommendation:           - Discharge patient to home (ambulatory).                           - Resume previous diet.                           -  Await pathology results.                           - If recurrent dysphagia, schedule esophageal                            manometry.                           - Return to my office in 3 months. Procedure Code(s):        --- Professional ---                           [phone], Esophagogastroduodenoscopy, flexible,                            transoral; with insertion of guide wire followed by                            passage of dilator(s) through esophagus over guide                            wire                           43239, 42, Esophagogastroduodenoscopy,  flexible,                            transoral; with biopsy, single or multiple Diagnosis Code(s):        --- Professional ---                           R13.10, Dysphagia, unspecified                           K44.9, Diaphragmatic hernia without obstruction or                            gangrene CPT copyright 2019 American Medical Association. All rights reserved. The codes documented in this report are preliminary and upon coder review may  be revised to meet current compliance requirements. [name], MD [name],  11/06/2021 1:23:12 PM This report has been signed electronically. Number of Addenda: 0

## 2021-11-06 NOTE — Anesthesia Preprocedure Evaluation (Addendum)
Anesthesia Evaluation  Patient identified by MRN, date of birth, ID band Patient awake    Reviewed: Allergy & Precautions, H&P , NPO status , Patient's Chart, lab work & pertinent test results, reviewed documented beta blocker date and time   Airway Mallampati: II  TM Distance: >3 FB Neck ROM: full    Dental no notable dental hx.    Pulmonary COPD,  oxygen dependent, Current Smoker and Patient abstained from smoking.,    Pulmonary exam normal breath sounds clear to auscultation       Cardiovascular Exercise Tolerance: Good hypertension, negative cardio ROS   Rhythm:regular Rate:Normal     Neuro/Psych PSYCHIATRIC DISORDERS Bipolar Disorder negative neurological ROS     GI/Hepatic Neg liver ROS, GERD  Medicated,  Endo/Other  negative endocrine ROSdiabetes, Type 2  Renal/GU Renal disease  negative genitourinary   Musculoskeletal   Abdominal   Peds  Hematology  (+) Blood dyscrasia, anemia ,   Anesthesia Other Findings   Reproductive/Obstetrics negative OB ROS                            Anesthesia Physical Anesthesia Plan  ASA: 3  Anesthesia Plan: General   Post-op Pain Management:    Induction:   PONV Risk Score and Plan:   Airway Management Planned:   Additional Equipment:   Intra-op Plan:   Post-operative Plan:   Informed Consent: I have reviewed the patients History and Physical, chart, labs and discussed the procedure including the risks, benefits and alternatives for the proposed anesthesia with the patient or authorized representative who has indicated his/her understanding and acceptance.     Dental Advisory Given  Plan Discussed with: CRNA  Anesthesia Plan Comments:        Anesthesia Quick Evaluation

## 2021-11-06 NOTE — Progress Notes (Signed)
Pt A/O x3.  Resp even and unlabored on RA.  No audible wheezing noted.  Pt denies resp distress.  States he does not a breathing treatment and that he has treatment at home if needed.  Advised pt's ride to help keep watch on pt's blood sugar and to not let pt go to sleep w/o monitoring blood sugar.  Both verbalized understanding.

## 2021-11-06 NOTE — Transfer of Care (Signed)
Immediate Anesthesia Transfer of Care Note  Patient: [name]  Procedure(s) Performed: ESOPHAGOGASTRODUODENOSCOPY (EGD) WITH PROPOFOL  Patient Location: PACU  Anesthesia Type:General  Level of Consciousness: awake, alert  and oriented  Airway & Oxygen Therapy: Patient Spontanous Breathing and Patient connected to nasal cannula oxygen  Post-op Assessment: Report given to RN, Post -op Vital signs reviewed and stable, Patient moving all extremities X 4 and Patient able to stick tongue midline  Post vital signs: Reviewed  Last Vitals:  Vitals Value Taken Time  BP 108/57   Temp 97.5   Pulse 85   Resp 18   SpO2 97    108 Last Pain:  Vitals:   11/06/21 1115  TempSrc: Oral  PainSc: 0-No pain      Patients Stated Pain Goal: 8 (14/38/88 7579)  Complications: No notable events documented.

## 2021-11-06 NOTE — Interval H&P Note (Signed)
History and Physical Interval Note:  11/06/2021 11:01 AM  [name]  has presented today for surgery, with the diagnosis of Dysphagia.  The various methods of treatment have been discussed with the patient and family. After consideration of risks, benefits and other options for treatment, the patient has consented to  Procedure(s) with comments: ESOPHAGOGASTRODUODENOSCOPY (EGD) WITH PROPOFOL (N/A) - 1215 as a surgical intervention.  The patient's history has been reviewed, patient examined, no change in status, stable for surgery.  I have reviewed the patient's chart and labs.  Questions were answered to the patient's satisfaction.     [name]

## 2021-11-06 NOTE — Discharge Instructions (Addendum)
You are being discharged to home.  Resume your previous diet.  We are waiting for your pathology results.  Return to my office in three months.

## 2021-11-07 NOTE — Anesthesia Postprocedure Evaluation (Signed)
Anesthesia Post Note  Patient: SIRIS HOOS  Procedure(s) Performed: ESOPHAGOGASTRODUODENOSCOPY (EGD) WITH PROPOFOL BIOPSY  Patient location during evaluation: Phase II Anesthesia Type: General Level of consciousness: awake Pain management: pain level controlled Vital Signs Assessment: post-procedure vital signs reviewed and stable Respiratory status: spontaneous breathing and respiratory function stable Cardiovascular status: blood pressure returned to baseline and stable Postop Assessment: no headache and no apparent nausea or vomiting Anesthetic complications: no Comments: Late entry   No notable events documented.   Last Vitals:  Vitals:   11/06/21 1326 11/06/21 1331  BP: 93/63 108/72  Pulse:    Resp: (!) 42 (!) 38  Temp:    SpO2: 97% 97%    Last Pain:  Vitals:   11/06/21 1331  TempSrc:   PainSc: 0-No pain                 Louann Sjogren

## 2021-11-08 LAB — SURGICAL PATHOLOGY

## 2021-11-11 ENCOUNTER — Encounter: Payer: Self-pay | Admitting: Pulmonary Disease

## 2021-11-11 ENCOUNTER — Ambulatory Visit (INDEPENDENT_AMBULATORY_CARE_PROVIDER_SITE_OTHER): Payer: Medicaid Other | Admitting: Pulmonary Disease

## 2021-11-11 VITALS — BP 100/64 | HR 114 | Temp 97.7°F | Ht 73.0 in | Wt 164.2 lb

## 2021-11-11 DIAGNOSIS — J8281 Chronic eosinophilic pneumonia: Secondary | ICD-10-CM

## 2021-11-11 DIAGNOSIS — J849 Interstitial pulmonary disease, unspecified: Secondary | ICD-10-CM | POA: Diagnosis not present

## 2021-11-11 DIAGNOSIS — Z720 Tobacco use: Secondary | ICD-10-CM

## 2021-11-11 DIAGNOSIS — G4734 Idiopathic sleep related nonobstructive alveolar hypoventilation: Secondary | ICD-10-CM | POA: Diagnosis not present

## 2021-11-11 MED ORDER — IPRATROPIUM-ALBUTEROL 0.5-2.5 (3) MG/3ML IN SOLN: 3.0000 mL | Freq: Four times a day (QID) | RESPIRATORY_TRACT | 5 refills | Status: AC | PRN

## 2021-11-11 NOTE — Progress Notes (Signed)
[facility]  Chief Complaint  Patient presents with   Follow-up    Patient states he has been losing weight recently but is not sure why. Otherwise doing good.     Constitutional:  BP 100/64 (BP Location: Left Arm, Patient Position: Sitting, Cuff Size: Normal)   Pulse (!) 114   Temp 97.7 F (36.5 C) (Oral)   Ht '6\' 1"'$  (1.854 m)   Wt 164 lb 3.2 oz (74.5 kg)   SpO2 90%   BMI 21.66 kg/m   Past Medical History:  Bipolar, ED, HTN, GERD, Pneumonia, HLD, DM type 2  Past Surgical History:  He  has a past surgical history that includes Appendectomy; Knee surgery (Bilateral); Cholecystectomy (N/A, 07/11/2013); Video bronchoscopy (Bilateral, 02/09/2018); Incision and drainage abscess / hematoma of bursa / knee / thigh; Colonoscopy with propofol (N/A, 01/15/2021); Esophagogastroduodenoscopy (egd) with propofol (N/A, 01/15/2021); biopsy (01/15/2021); and polypectomy (01/15/2021).  Brief Summary:  [name] is a 46 y.o. 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      Subjective:   He still smokes a few cigarettes per day.  He isn't smoking marijuana.  He remains on 5 mg prednisone.  Using symbicort.  Feels nebulizer helps, and he uses this several times per week.  Has cough with clear sputum.    He is worried that he still is losing weight.  He went to ER a couple of times because he was feeling dizzy, and he was told he is dehydrated.  CT chest from 09/23/21 and 10/15/21 were stable.  He saw Dr. Jenetta Downer with GI for dysphagia.  EGD was unremarkable.  Physical Exam:   Appearance - well kempt   ENMT - no sinus tenderness, no oral exudate, no LAN, Mallampati 3 airway, no stridor  Respiratory - b/l inspiratory and expiratory squeaks  CV - s1s2 regular rate and rhythm, no murmurs  Ext - no clubbing, no edema  Skin - no rashes  Psych - normal mood and affect      Pulmonary testing:  Bronchoscopy  02/09/18 >> acute inflammation, 165 WBC (63% N, 2% L, 12% M, 23% E) IgE 02/16/18 >> 369 ACE 11/07/20 >> 34 PFT 11/26/20 >> FEV1 1.73 (44%), FEV1% 75, TLC 4.89 (64%), DLCO 36% Serology 05/24/21 >> ANA, ANCA negative IgE 05/24/21 >> 43  Chest Imaging:  CT chest 12/02/17 >> diffuse GGO with mild BTX, no honeycombing w/o change from 2014 HRCT chest 11/23/20 >> enlarged LN, bronchial wall thickening, mod/severe pulmonary fibrosis with extensive peribronchovascular areas of traction BTX, honeycombing, interlobular septal thickening, and architectural distortion HRCT chest 04/26/21 >> stable findings compared to June 2022 CT angio chest 10/15/21 >> stable LN enlargement, stable ILD with BTX  Cardiac Tests:  Echo 11/02/20 >> EF 50 to 55%  Social History:  He  reports that he has been smoking cigarettes. He has a 2.50 pack-year smoking history. He has never used smokeless tobacco. He reports that he does not drink alcohol and does not use drugs.  Family History:  His family history includes CAD in his maternal grandmother; Diabetes in his maternal grandmother and mother.     Discussion:  He was originally diagnosed with eosinophilic pneumonia in 1638.  He was lost to follow up until June 2022.  His CT imaging and PFT are consistent with advanced ILD, and most recent imaging study has been stable.  He denies recent marijuana use, but has continued tobacco cigarette use.  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 - continue prednisone 5 mg daily - continue symbicort 160 two puffs bid - prn duoneb - defer discussion about referral for lung transplant assessment until he is off cigarettes   Nocturnal hypoxemia. - 2 liters oxygen at night - uses [place] for his DME  CKD 2. - followed by Dr. Ulice Bold with [facility]  DM type 2. - follow up with his PCP to adjust regimen while he is on prednisone  Tobacco abuse. - he is  using nicotine patch  Weight loss. - advised him to follow up with his PCP  Time Spent Involved in Patient Care on Day of Examination:  47 minutes  Follow up:   Patient Instructions  Follow up in 4 months  Medication List:   Allergies as of 11/11/2021   No Known Allergies      Medication List        Accurate as of November 11, 2021  1:52 PM. If you have any questions, ask your nurse or doctor.          Accu-Chek FastClix Lancets Misc USE TO TEST BLOOD SUGAR FOUR TIMES DAILY.   Accu-Chek Guide test strip Generic drug: glucose blood USE TO CHECK BLOOD SUGAR FOUR TIMES DAILY   acetaminophen 500 MG tablet Commonly known as: TYLENOL Take 500-1,000 mg by mouth See admin instructions. Take 1-2 tablets (500-1,000 mg) by mouth every 6 hours as needed for pain & take 2 tablets (1000 mg) by mouth SCHEDULED at bedtime.   albuterol 108 (90 Base) MCG/ACT inhaler Commonly known as: VENTOLIN HFA Inhale 2 puffs into the lungs every 6 (six) hours as needed. What changed: reasons to take this   aspirin EC 81 MG tablet Take 1 tablet (81 mg total) by mouth daily with breakfast.   chlorthalidone 25 MG tablet Commonly known as: HYGROTON Take 25 mg by mouth at bedtime.   divalproex 500 MG DR tablet Commonly known as: DEPAKOTE Take 2 tablets (1,000 mg total) by mouth at bedtime.   fenofibrate 145 MG tablet Commonly known as: TRICOR Take 145 mg by mouth at bedtime.   ferrous sulfate 325 (65 FE) MG tablet Take 325 mg by mouth daily with breakfast.   furosemide 40 MG tablet Commonly known as: LASIX Take 40 mg by mouth 2 (two) times daily.   ipratropium-albuterol 0.5-2.5 (3) MG/3ML Soln Commonly known as: DUONEB Take 3 mLs by nebulization every 6 (six) hours as needed. Started by: Chesley Mires, MD   Lantus SoloStar 100 UNIT/ML Solostar Pen Generic drug: insulin glargine Inject 20 Units into the skin at bedtime.   lisinopril 2.5 MG tablet Commonly known as: ZESTRIL Take 2.5  mg by mouth daily.   metoCLOPramide 10 MG tablet Commonly known as: REGLAN Take 10 mg by mouth 4 (four) times daily.   midodrine 5 MG tablet Commonly known as: PROAMATINE Take 1 tablet (5 mg total) by mouth 3 (three) times daily with meals.   multivitamin with minerals Tabs tablet Take 1 tablet by mouth in the morning. One-A-Day Men's Multivitamin   nicotine 7 mg/24hr patch Commonly known as: Nicoderm CQ Place 1 patch (7 mg total) onto the skin daily.   omeprazole 20 MG capsule Commonly known as: PRILOSEC Take 20 mg by mouth at bedtime.   predniSONE 5 MG tablet Commonly known as: DELTASONE Take 1 tablet (5 mg total) by mouth daily with breakfast.   risperidone 4 MG tablet Commonly known as: RISPERDAL Take 4  mg by mouth at bedtime.   simvastatin 20 MG tablet Commonly known as: ZOCOR Take 20 mg by mouth at bedtime.   solifenacin 5 MG tablet Commonly known as: VESICARE Take 5 mg by mouth at bedtime.   Sure Comfort Insulin Syringe 30G X 1/2" 1 ML Misc Generic drug: Insulin Syringe-Needle U-100 USE AS DIRECTED PER PROVIDER TO INJECT INSULIN DAILY.   BD Veo Insulin Syringe U/F 31G X 15/64" 1 ML Misc Generic drug: Insulin Syringe-Needle U-100 USE AS DIRECTED PER PROVIDER TO INJECT INSULIN DAILY.   Sure Comfort Pen Needles 31G X 8 MM Misc Generic drug: Insulin Pen Needle USE TO INJECT INSULIN FOUR TIMES DAILY.   Symbicort 160-4.5 MCG/ACT inhaler Generic drug: budesonide-formoterol Inhale 2 puffs into the lungs 2 (two) times daily.        Signature:  Chesley Mires, MD [facility] Pager - [phone] 11/11/2021, 1:52 PM

## 2021-11-11 NOTE — Patient Instructions (Signed)
Follow up in 4 months 

## 2021-11-13 ENCOUNTER — Encounter (HOSPITAL_COMMUNITY): Payer: Self-pay | Admitting: Gastroenterology

## 2021-11-20 ENCOUNTER — Other Ambulatory Visit: Payer: Self-pay | Admitting: "Endocrinology

## 2021-11-20 DIAGNOSIS — E1110 Type 2 diabetes mellitus with ketoacidosis without coma: Secondary | ICD-10-CM

## 2021-11-21 ENCOUNTER — Ambulatory Visit: Payer: Medicaid Other | Admitting: "Endocrinology

## 2022-01-10 ENCOUNTER — Other Ambulatory Visit: Payer: Self-pay | Admitting: Pulmonary Disease

## 2022-01-24 IMAGING — CT CT CHEST HIGH RESOLUTION W/O CM
3 of 5 series · 14 of 36 positions shown, 15 images · non-contrast
Comparison: 12/02/2017

CLINICAL DATA: Follow-up interstitial lung disease chronic
shortness of breath

EXAM:
CT CHEST WITHOUT CONTRAST
TECHNIQUE: Multidetector CT imaging of the chest was performed following the
standard protocol without intravenous contrast. High resolution
imaging of the lungs, as well as inspiratory and expiratory imaging,
was performed.

[Series 3: standard chest · axial · 0.75mm/px · z∈[-594,-338]mm · 8 of 158 slices shown]
[im 15/158  mediastinal]
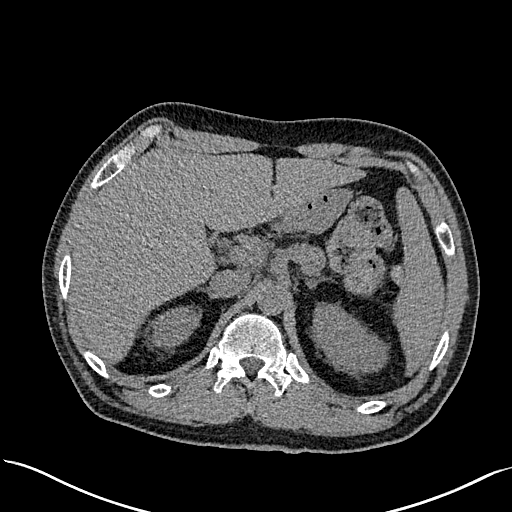
[im 30/158  mediastinal]
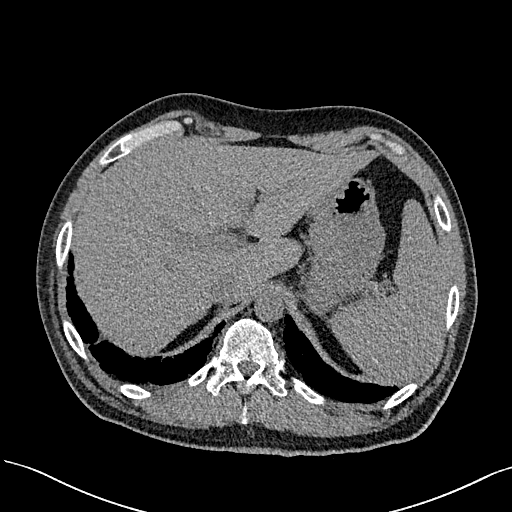
[im 53/158  mediastinal]
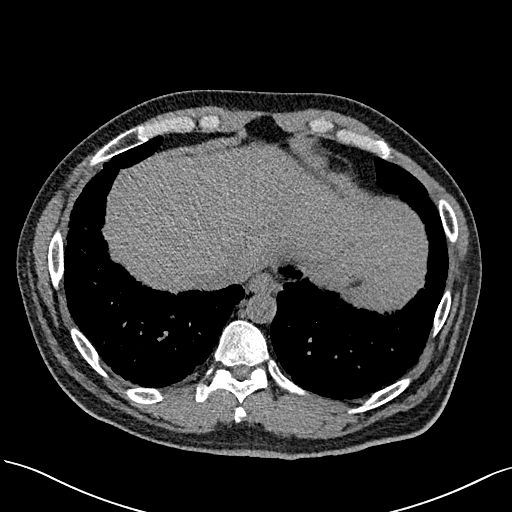
[im 68/158  mediastinal]
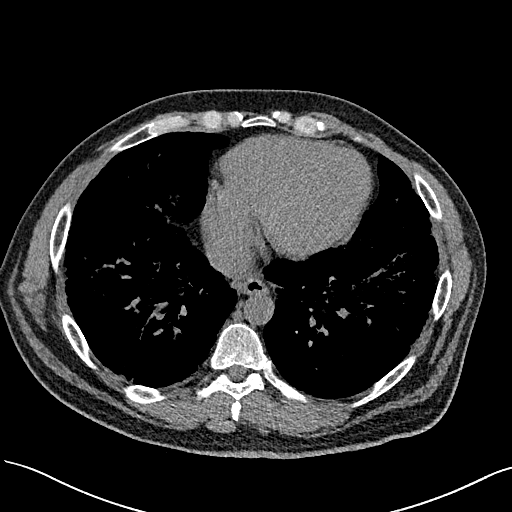
[im 90/158  mediastinal]
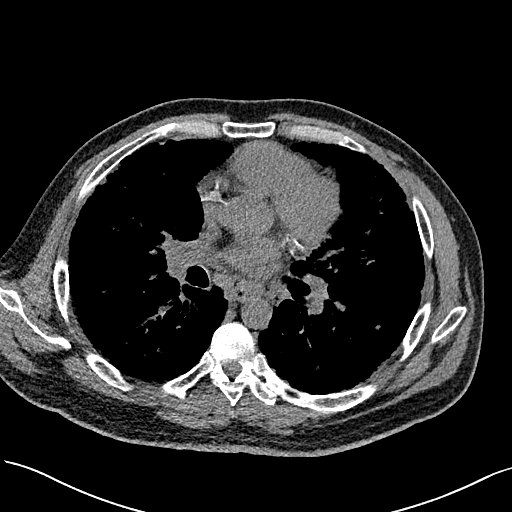
[im 105/158  mediastinal]
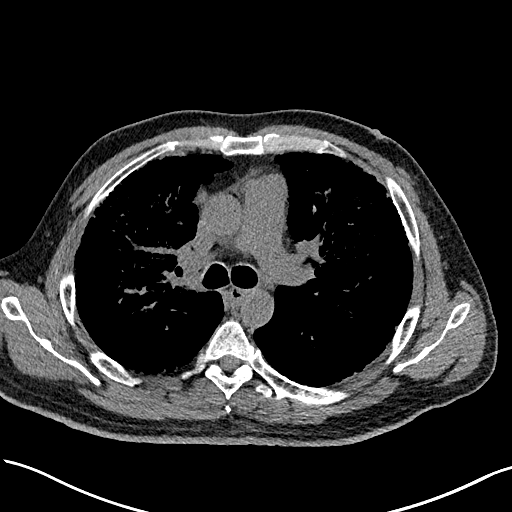
[im 128/158  mediastinal]
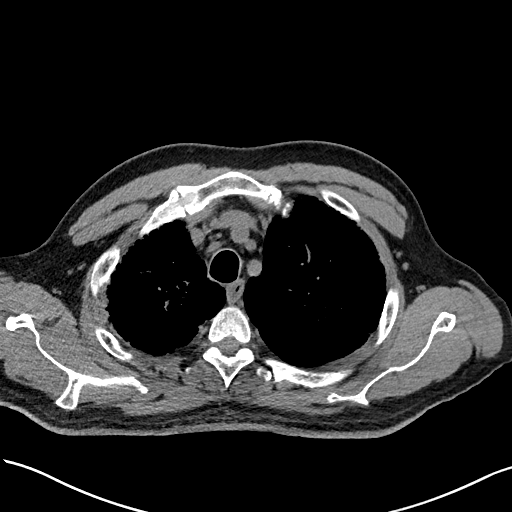
[im 143/158  mediastinal]
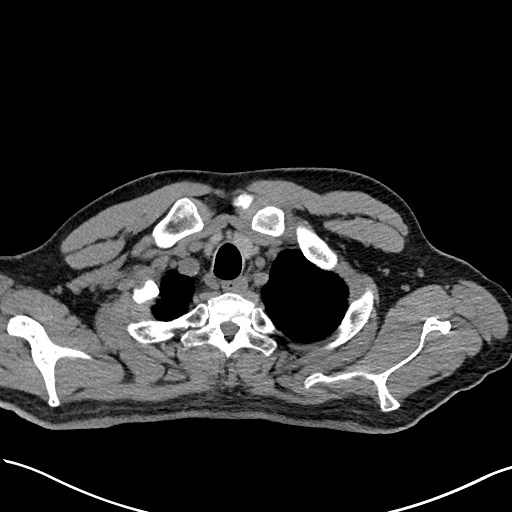

[Series 7: coronal · coronal · 0.64mm/px · 3 of 149 slices shown]
[im 30/149  lung]
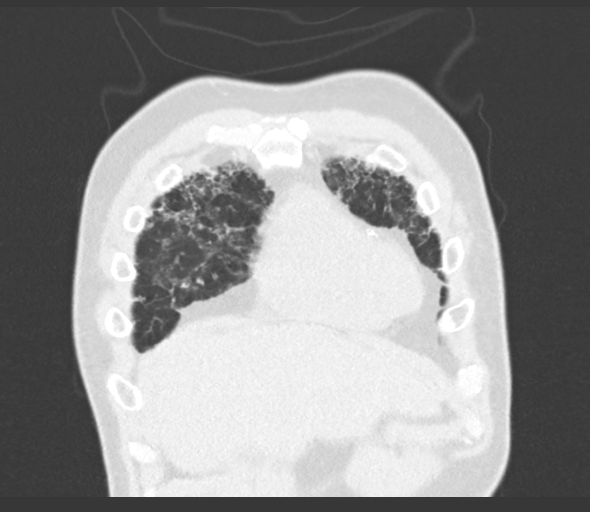
[im 60/149  lung]
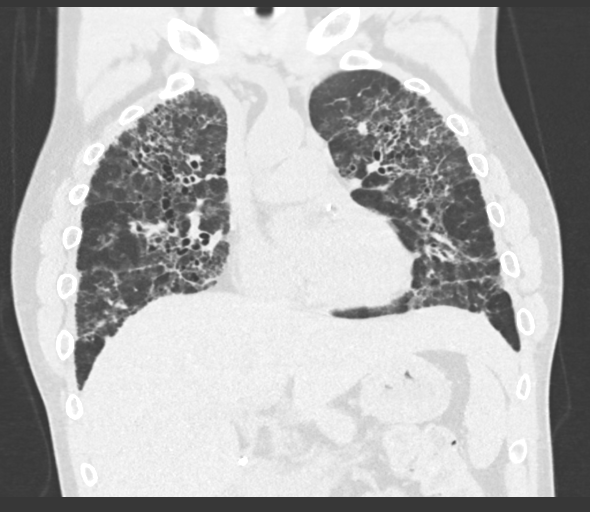
[im 89/149  lung]
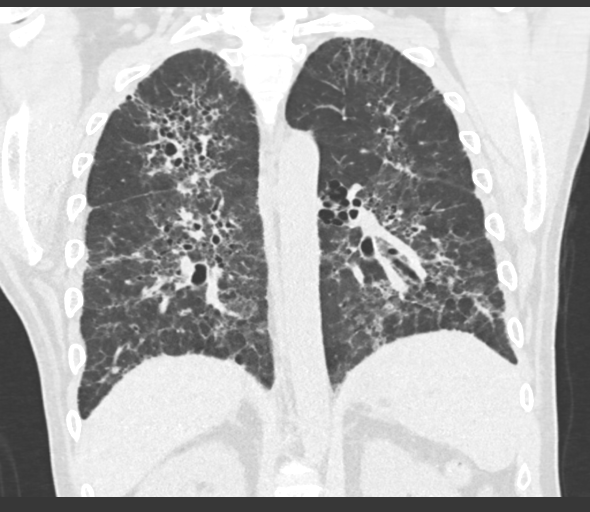

[Series 9: high res insp · axial · 0.76mm/px · z∈[-578,-337]mm · 3 of 18 slices shown, 4 images]
[im 1/18  mediastinal]
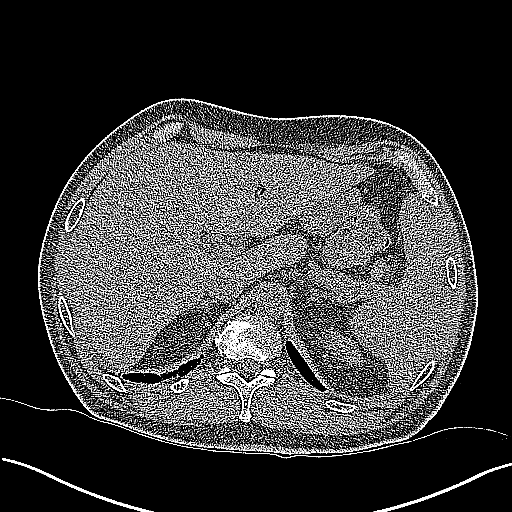
[im 1/18  lung]
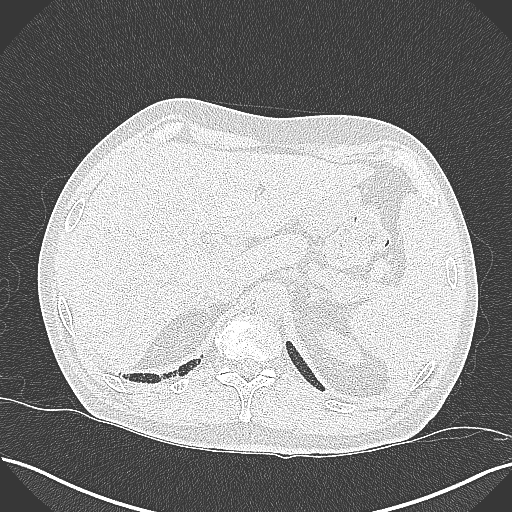
[im 9/18  lung]
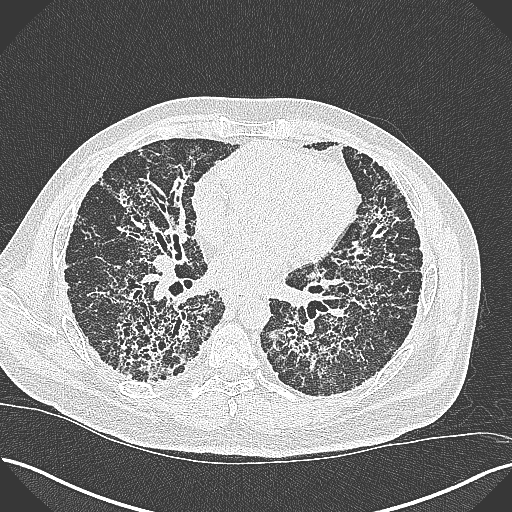
[im 18/18  lung]
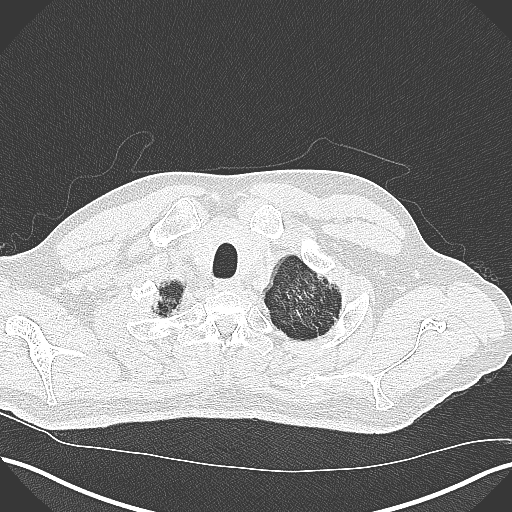

[14 of 36 positions shown; findings below may reference images not displayed]

FINDINGS: Cardiovascular: Aortic atherosclerosis. Normal heart size.
Three-vessel coronary artery calcifications. No pericardial
effusion.

Mediastinum/Nodes: Numerous enlarged mediastinal and bilateral hilar
lymph nodes, unchanged. Thyroid gland, trachea, and esophagus
demonstrate no significant findings.

Lungs/Pleura: Diffuse bilateral bronchial wall thickening. Marked
interval worsening of moderate to severe pulmonary fibrosis,
characterized by extensive peribronchovascular areas of traction
bronchiectasis, honeycombing, interlobular septal thickening, and
architectural distortion. There is no clear apical to basal
gradient. Very extensive, fine perilymphatic nodularity throughout,
worsened compared to prior examination. No significant air trapping
on expiratory phase imaging. No pleural effusion or pneumothorax.

Upper Abdomen: No acute abnormality.

Musculoskeletal: No chest wall mass or suspicious bone lesions
identified.
IMPRESSION: 1. Marked interval worsening of moderate to severe pulmonary
fibrosis when compared to examination dated 12/02/2017,
characterized by extensive peribronchovascular areas of traction
bronchiectasis, honeycombing, interlobular septal thickening, and
architectural distortion. There is no clear apical to basal
gradient. Very extensive, fine perilymphatic nodularity throughout,
worsened compared to prior examination. Findings are characteristic
of advanced fibrotic pulmonary sarcoidosis; in an "alternative
diagnosis" pattern if characterized by ATS pulmonary fibrosis
criteria.
2. Diffuse bilateral bronchial wall thickening, consistent with
nonspecific infectious or inflammatory bronchitis.
3. Numerous enlarged mediastinal and bilateral hilar lymph nodes,
unchanged, and in keeping with nodal sarcoidosis.
4. Coronary artery disease.

Aortic Atherosclerosis ([id number]).

## 2022-01-25 ENCOUNTER — Other Ambulatory Visit: Payer: Self-pay

## 2022-01-25 ENCOUNTER — Encounter (HOSPITAL_COMMUNITY): Payer: Self-pay

## 2022-01-25 ENCOUNTER — Emergency Department (HOSPITAL_COMMUNITY): Payer: Medicaid Other

## 2022-01-25 DIAGNOSIS — E860 Dehydration: Secondary | ICD-10-CM | POA: Insufficient documentation

## 2022-01-25 DIAGNOSIS — E119 Type 2 diabetes mellitus without complications: Secondary | ICD-10-CM | POA: Diagnosis not present

## 2022-01-25 DIAGNOSIS — R634 Abnormal weight loss: Secondary | ICD-10-CM | POA: Diagnosis present

## 2022-01-25 DIAGNOSIS — Z794 Long term (current) use of insulin: Secondary | ICD-10-CM | POA: Diagnosis not present

## 2022-01-25 DIAGNOSIS — Z7982 Long term (current) use of aspirin: Secondary | ICD-10-CM | POA: Diagnosis not present

## 2022-01-25 DIAGNOSIS — F17200 Nicotine dependence, unspecified, uncomplicated: Secondary | ICD-10-CM | POA: Insufficient documentation

## 2022-01-25 LAB — CBC WITH DIFFERENTIAL/PLATELET
Abs Immature Granulocytes: 0.02 10*3/uL (ref 0.00–0.07)
Basophils Absolute: 0.1 10*3/uL (ref 0.0–0.1)
Basophils Relative: 1 %
Eosinophils Absolute: 0.4 10*3/uL (ref 0.0–0.5)
Eosinophils Relative: 6 %
HCT: 37.9 % — ABNORMAL LOW (ref 39.0–52.0)
Hemoglobin: 12 g/dL — ABNORMAL LOW (ref 13.0–17.0)
Immature Granulocytes: 0 %
Lymphocytes Relative: 28 %
Lymphs Abs: 2 10*3/uL (ref 0.7–4.0)
MCH: 28.1 pg (ref 26.0–34.0)
MCHC: 31.7 g/dL (ref 30.0–36.0)
MCV: 88.8 fL (ref 80.0–100.0)
Monocytes Absolute: 0.5 10*3/uL (ref 0.1–1.0)
Monocytes Relative: 6 %
Neutro Abs: 4.4 10*3/uL (ref 1.7–7.7)
Neutrophils Relative %: 59 %
Platelets: 242 10*3/uL (ref 150–400)
RBC: 4.27 MIL/uL (ref 4.22–5.81)
RDW: 15.5 % (ref 11.5–15.5)
WBC: 7.4 10*3/uL (ref 4.0–10.5)
nRBC: 0 % (ref 0.0–0.2)

## 2022-01-25 LAB — CBG MONITORING, ED: Glucose-Capillary: 270 mg/dL — ABNORMAL HIGH (ref 70–99)

## 2022-01-25 NOTE — ED Notes (Addendum)
In Triage, Pts O2 Sats dropped to 88% on RA. Pt placed on 3L nasal Cannula and O2 Sats improved to 93%-98%.

## 2022-01-25 NOTE — ED Triage Notes (Signed)
Pt arrived via POV c/o on-going 2 weeks of failure to thrive, decreased appetite, bowel urgency, lethargy, blood sugar problems at home, Pt reports his doctor stopped refilling his insulin. Pts CBG 270 in Triage. Pt reports weakness in bilateral legs and tingling sensation. Pt reports inability to keep food down and has emesis after trying to consume PO food.

## 2022-01-26 ENCOUNTER — Emergency Department (HOSPITAL_COMMUNITY): Payer: Medicaid Other

## 2022-01-26 ENCOUNTER — Other Ambulatory Visit: Payer: Self-pay

## 2022-01-26 ENCOUNTER — Emergency Department (HOSPITAL_COMMUNITY)
Admission: EM | Admit: 2022-01-26 | Discharge: 2022-01-26 | Disposition: A | Discharge status: Home / Self Care | Payer: Medicaid Other | Attending: Emergency Medicine | Admitting: Emergency Medicine

## 2022-01-26 DIAGNOSIS — R634 Abnormal weight loss: Secondary | ICD-10-CM

## 2022-01-26 DIAGNOSIS — E860 Dehydration: Secondary | ICD-10-CM

## 2022-01-26 LAB — URINALYSIS, ROUTINE W REFLEX MICROSCOPIC
Bilirubin Urine: NEGATIVE
Glucose, UA: 50 mg/dL — AB
Hgb urine dipstick: NEGATIVE
Ketones, ur: NEGATIVE mg/dL
Leukocytes,Ua: NEGATIVE
Nitrite: NEGATIVE
Protein, ur: NEGATIVE mg/dL
Specific Gravity, Urine: 1.035 — ABNORMAL HIGH (ref 1.005–1.030)
pH: 5 (ref 5.0–8.0)

## 2022-01-26 LAB — BASIC METABOLIC PANEL
Anion gap: 9 (ref 5–15)
BUN: 22 mg/dL — ABNORMAL HIGH (ref 6–20)
CO2: 25 mmol/L (ref 22–32)
Calcium: 9 mg/dL (ref 8.9–10.3)
Chloride: 99 mmol/L (ref 98–111)
Creatinine, Ser: 0.97 mg/dL (ref 0.61–1.24)
GFR, Estimated: >60 mL/min (ref >60)
Glucose, Bld: 263 mg/dL — ABNORMAL HIGH (ref 70–99)
Potassium: 4.1 mmol/L (ref 3.5–5.1)
Sodium: 133 mmol/L — ABNORMAL LOW (ref 135–145)

## 2022-01-26 LAB — BLOOD GAS, VENOUS
Acid-Base Excess: 2.8 mmol/L — ABNORMAL HIGH (ref 0.0–2.0)
Bicarbonate: 30.4 mmol/L — ABNORMAL HIGH (ref 20.0–28.0)
Drawn by: 51519
O2 Saturation: 34.7 %
Patient temperature: 36.4
pCO2, Ven: 57 mmHg (ref 44–60)
pH, Ven: 7.33 (ref 7.25–7.43)
pO2, Ven: <31 mmHg — CL (ref 32–45)

## 2022-01-26 MED ORDER — IOHEXOL 300 MG/ML  SOLN
100.0000 mL | Freq: Once | INTRAMUSCULAR | Status: AC | PRN
  Administered 2022-01-26: 100 mL via INTRAVENOUS

## 2022-01-26 MED ORDER — LACTATED RINGERS IV BOLUS
1000.0000 mL | Freq: Once | INTRAVENOUS | Status: AC
  Administered 2022-01-26: 1000 mL via INTRAVENOUS

## 2022-01-26 NOTE — ED Notes (Signed)
Family called and verified they would be here in 5 minutes to give pt a ride.

## 2022-01-26 NOTE — ED Notes (Signed)
ED Provider at bedside. 

## 2022-01-26 NOTE — ED Provider Notes (Signed)
[facility] EMERGENCY DEPARTMENT Provider Note   CSN: [id number] Arrival date & time: 01/25/22  2228     History  Chief Complaint  Patient presents with   Failure To Thrive    [name] is a 46 y.o. male.  46 year old male with longstanding interstitial lung disease who still smokes, diabetes and unintentional weight loss for last couple years.  Patient states he been follow-up with his doctor multiple other physicians related to this and no one can figure it out.  He states her last couple weeks is gotten significantly worse.  He states he eats and drinks normally including Glucerna shakes but they all seem to just go right through him.  He does not gain weight and keeps losing weight.  Is worried he might have cancer or some other etiology.  No night sweats, fevers, cough or any other associated symptoms.  His urine is darker than normal but no other urinary changes.  He has been tested for HIV and has not been sexually active since then.        Home Medications Prior to Admission medications   Medication Sig Start Date End Date Taking? Authorizing Provider  chlorthalidone (HYGROTON) 25 MG tablet Take 25 mg by mouth at bedtime. 09/11/20  Yes [provider]  divalproex (DEPAKOTE) 500 MG DR tablet Take 2 tablets (1,000 mg total) by mouth at bedtime. 12/28/19  Yes Emokpae, Courage, MD  fenofibrate (TRICOR) 145 MG tablet Take 145 mg by mouth at bedtime. 09/11/20  Yes [provider]  ferrous sulfate 325 (65 FE) MG tablet Take 325 mg by mouth daily with breakfast.   Yes [provider]  furosemide (LASIX) 40 MG tablet Take 40 mg by mouth 2 (two) times daily. 10/03/21  Yes [provider]  insulin glargine (LANTUS SOLOSTAR) 100 UNIT/ML Solostar Pen Inject 20 Units into the skin at bedtime. 10/21/21  Yes Nida, Marella Chimes, MD  Insulin Syringe-Needle U-100 (SURE COMFORT INSULIN SYRINGE) 30G X 1/2" 1 ML MISC USE AS DIRECTED PER PROVIDER TO INJECT  INSULIN DAILY. 12/28/19  Yes Emokpae, Courage, MD  lisinopril (ZESTRIL) 2.5 MG tablet Take 2.5 mg by mouth daily. 04/30/21  Yes [provider]  metoCLOPramide (REGLAN) 10 MG tablet Take 10 mg by mouth 4 (four) times daily. 08/15/21  Yes [provider]  midodrine (PROAMATINE) 5 MG tablet Take 1 tablet (5 mg total) by mouth 3 (three) times daily with meals. 11/03/20  Yes Tat, Shanon Brow, MD  Multiple Vitamin (MULTIVITAMIN WITH MINERALS) TABS tablet Take 1 tablet by mouth in the morning. One-A-Day Men's Multivitamin   Yes [provider]  omeprazole (PRILOSEC) 20 MG capsule Take 20 mg by mouth at bedtime.   Yes [provider]  risperidone (RISPERDAL) 4 MG tablet Take 4 mg by mouth at bedtime.    Yes [provider]  simvastatin (ZOCOR) 20 MG tablet Take 20 mg by mouth at bedtime.   Yes [provider]  solifenacin (VESICARE) 5 MG tablet Take 5 mg by mouth at bedtime.    Yes [provider]  Accu-Chek FastClix Lancets MISC USE TO TEST BLOOD SUGAR FOUR TIMES DAILY. 07/16/20   Cassandria Anger, MD  ACCU-CHEK GUIDE test strip USE TO CHECK BLOOD SUGAR FOUR TIMES DAILY 11/21/21   Cassandria Anger, MD  acetaminophen (TYLENOL) 500 MG tablet Take 500-1,000 mg by mouth See admin instructions. Take 1-2 tablets (500-1,000 mg) by mouth every 6 hours as needed for pain & take 2 tablets (  1000 mg) by mouth SCHEDULED at bedtime.    [provider]  albuterol (VENTOLIN HFA) 108 (90 Base) MCG/ACT inhaler Inhale 2 puffs into the lungs every 6 (six) hours as needed. Patient taking differently: Inhale 2 puffs into the lungs every 6 (six) hours as needed for shortness of breath or wheezing. 12/28/19   Roxan Hockey, MD  aspirin EC 81 MG EC tablet Take 1 tablet (81 mg total) by mouth daily with breakfast. 12/28/19   Roxan Hockey, MD  BD VEO INSULIN SYRINGE U/F 31G X 15/64" 1 ML MISC USE AS DIRECTED PER PROVIDER TO INJECT INSULIN DAILY. 01/14/21    Cassandria Anger, MD  Insulin Pen Needle (SURE COMFORT PEN NEEDLES) 31G X 8 MM MISC USE TO INJECT INSULIN FOUR TIMES DAILY. 07/24/21   Cassandria Anger, MD  ipratropium-albuterol (DUONEB) 0.5-2.5 (3) MG/3ML SOLN Take 3 mLs by nebulization every 6 (six) hours as needed. 11/11/21   Chesley Mires, MD  nicotine (NICODERM CQ) 7 mg/24hr patch Place 1 patch (7 mg total) onto the skin daily. 10/21/21   Cassandria Anger, MD  predniSONE (DELTASONE) 5 MG tablet Take 1 tablet (5 mg total) by mouth daily with breakfast. 08/26/21   Chesley Mires, MD  SYMBICORT 160-4.5 MCG/ACT inhaler Inhale 2 puffs into the lungs 2 (two) times daily. 12/28/19   Roxan Hockey, MD      Allergies    Patient has no known allergies.    Review of Systems   Review of Systems  Physical Exam Updated Vital Signs BP (!) 136/92   Pulse 78   Temp (!) 97.5 F (36.4 C) (Oral)   Resp (!) 21   Ht '6\' 1"'$  (1.854 m)   Wt 65.2 kg   SpO2 100%   BMI 18.97 kg/m  Physical Exam Vitals and nursing note reviewed.  Constitutional:      Appearance: He is well-developed. He is cachectic. He is ill-appearing.  HENT:     Head: Normocephalic and atraumatic.     Mouth/Throat:     Mouth: Mucous membranes are dry.  Eyes:     Pupils: Pupils are equal, round, and reactive to light.  Cardiovascular:     Rate and Rhythm: Normal rate.  Pulmonary:     Effort: Pulmonary effort is normal. No respiratory distress.  Abdominal:     General: There is no distension.  Musculoskeletal:        General: Normal range of motion.     Cervical back: Normal range of motion.  Skin:    General: Skin is warm and dry.  Neurological:     General: No focal deficit present.     Mental Status: He is alert.     ED Results / Procedures / Treatments   Labs (all labs ordered are listed, but only abnormal results are displayed) Labs Reviewed  BASIC METABOLIC PANEL - Abnormal; Notable for the following components:      Result Value   Sodium 133 (*)     Glucose, Bld 263 (*)    BUN 22 (*)    All other components within normal limits  CBC WITH DIFFERENTIAL/PLATELET - Abnormal; Notable for the following components:   Hemoglobin 12.0 (*)    HCT 37.9 (*)    All other components within normal limits  URINALYSIS, ROUTINE W REFLEX MICROSCOPIC - Abnormal; Notable for the following components:   Specific Gravity, Urine 1.035 (*)    Glucose, UA 50 (*)    All other components within normal limits  BLOOD GAS, VENOUS - Abnormal; Notable for the following components:   pO2, Ven <31 (*)    Bicarbonate 30.4 (*)    Acid-Base Excess 2.8 (*)    All other components within normal limits  CBG MONITORING, ED - Abnormal; Notable for the following components:   Glucose-Capillary 270 (*)    All other components within normal limits    EKG None  Radiology CT CHEST ABDOMEN PELVIS W CONTRAST  Result Date: 01/26/2022 CLINICAL DATA:  46 year old male with history of unintended weight loss of 100 pounds over the past 1-2 years. EXAM: CT CHEST, ABDOMEN, AND PELVIS WITH CONTRAST TECHNIQUE: Multidetector CT imaging of the chest, abdomen and pelvis was performed following the standard protocol during bolus administration of intravenous contrast. RADIATION DOSE REDUCTION: This exam was performed according to the departmental dose-optimization program which includes automated exposure control, adjustment of the mA and/or kV according to patient size and/or use of iterative reconstruction technique. CONTRAST:  134m OMNIPAQUE IOHEXOL 300 MG/ML  SOLN COMPARISON:  Chest CTA 10/15/2021. CT of the chest, abdomen and pelvis 09/23/2021. FINDINGS: CT CHEST FINDINGS Cardiovascular: Heart size is normal. There is no significant pericardial fluid, thickening or pericardial calcification. There is aortic atherosclerosis, as well as atherosclerosis of the great vessels of the mediastinum and the coronary arteries, including calcified atherosclerotic plaque in the left main, left  anterior descending, left circumflex and right coronary arteries. Mediastinum/Nodes: Multiple prominent borderline enlarged and mildly enlarged mediastinal and hilar lymph nodes are again noted measuring up to 1.6 cm in short axis in prevascular nodal stations. Esophagus is unremarkable in appearance. No axillary lymphadenopathy. Lungs/Pleura: Lungs are again diffusely grossly abnormal, with widespread areas of mild ground-glass attenuation, extensive septal thickening, thickening of the peribronchovascular interstitium, extensive cylindrical and varicose bronchiectasis, and widespread fibro cavitary changes scattered throughout all aspects of the lungs bilaterally with no discernible craniocaudal gradient. Extensive upper lung predominant honeycombing is again noted. No definite confluent consolidative airspace disease. No pleural effusions. Micronodular thickening of the subpleural spaces and fissures. No larger more suspicious appearing pulmonary nodules or masses are noted. Musculoskeletal: There are no aggressive appearing lytic or blastic lesions noted in the visualized portions of the skeleton. CT ABDOMEN PELVIS FINDINGS Hepatobiliary: No suspicious cystic or solid hepatic lesions. No intra or extrahepatic biliary ductal dilatation. Status post cholecystectomy. Pancreas: No definite pancreatic mass or peripancreatic fluid collections or inflammatory changes are noted on today's noncontrast CT examination. Spleen: Unremarkable. Adrenals/Urinary Tract: Bilateral kidneys and bilateral adrenal glands are normal in appearance. No hydroureteronephrosis. Urinary bladder is normal in appearance. Stomach/Bowel: The appearance of the stomach is normal. There is no pathologic dilatation of small bowel or colon. The appendix is not confidently identified and may be surgically absent. Regardless, there are no inflammatory changes noted adjacent to the cecum to suggest the presence of an acute appendicitis at this time.  Vascular/Lymphatic: Extensive aortic atherosclerosis, without evidence of aneurysm or dissection in the abdominal or pelvic vasculature. No lymphadenopathy noted in the abdomen or pelvis. Reproductive: Prostate gland and seminal vesicles are unremarkable in appearance. Other: No significant volume of ascites.  No pneumoperitoneum. Musculoskeletal: There are no aggressive appearing lytic or blastic lesions noted in the visualized portions of the skeleton. IMPRESSION: 1. No acute findings are noted in the chest, abdomen or pelvis. 2. Severe chronic lung disease, similar to prior examinations, with associated mediastinal and bilateral hilar lymphadenopathy, which almost certainly reflects advanced sarcoidosis. Further clinical evaluation is recommended. 3. Severe age advanced atherosclerosis, in addition to  left main and three-vessel coronary artery disease. Please note that although the presence of coronary artery calcium documents the presence of coronary artery disease, the severity of this disease and any potential stenosis cannot be assessed on this non-gated CT examination. Assessment for potential risk factor modification, dietary therapy or pharmacologic therapy may be warranted, if clinically indicated. Electronically Signed   By: Vinnie Langton M.D.   On: 01/26/2022 05:48   DG Chest 2 View  Result Date: 01/25/2022 CLINICAL DATA:  Decreased appetite, failure to thrive. EXAM: CHEST - 2 VIEW COMPARISON:  Chest CT 10/15/2021.  Chest x-ray 09/22/2021. FINDINGS: The heart size and mediastinal contours are within normal limits. Diffuse chronic interstitial changes are again seen throughout both lungs. There is no new focal lung consolidation, pleural effusion or pneumothorax identified. No acute fractures are seen. IMPRESSION: 1. Advanced chronic interstitial lung disease appears unchanged. 2. No new focal lung infiltrate. Electronically Signed   By: Ronney Asters M.D.   On: 01/25/2022 23:59     Procedures Procedures    Medications Ordered in ED Medications  lactated ringers bolus 1,000 mL (1,000 mLs Intravenous New Bag/Given 01/26/22 0347)  iohexol (OMNIPAQUE) 300 MG/ML solution 100 mL (100 mLs Intravenous Contrast Given 01/26/22 0433)    ED Course/ Medical Decision Making/ A&P                           Medical Decision Making Amount and/or Complexity of Data Reviewed Labs: ordered. Radiology: ordered. ECG/medicine tests: ordered.  Risk Prescription drug management.   Some type of chronic condition that is causing his weight loss.  No evidence of malignancy on CT scans.  His labs are overall reassuring.  His vital signs are stable and normal.  I will see indication for admission to the hospital for this.  Could be related to his lung disease versus some type of GI malabsorption.  I discussed quitting smoking to become eligible for possible lung transplant but also discussed following up with gastroenterology for further testing for malabsorption.  Either way he has an appoint with his primary doctor on Tuesday to discuss next steps.   Final Clinical Impression(s) / ED Diagnoses Final diagnoses:  Weight loss  Dehydration    Rx / DC Orders ED Discharge Orders     None         Hutton Pellicane, Corene Cornea, MD 01/26/22 [phone]

## 2022-01-26 NOTE — ED Notes (Signed)
Date and time results received: 01/26/22 0357   Test: pO2 Critical Value: < 31  Name of Provider Notified: Merrily Pew MD

## 2022-01-28 ENCOUNTER — Encounter (HOSPITAL_COMMUNITY): Payer: Self-pay | Admitting: Emergency Medicine

## 2022-01-28 ENCOUNTER — Emergency Department (HOSPITAL_COMMUNITY)
Admission: EM | Admit: 2022-01-28 | Discharge: 2022-01-28 | Disposition: A | Discharge status: Home / Self Care | Payer: Medicaid Other | Attending: Emergency Medicine | Admitting: Emergency Medicine

## 2022-01-28 ENCOUNTER — Emergency Department (HOSPITAL_COMMUNITY): Payer: Medicaid Other

## 2022-01-28 ENCOUNTER — Other Ambulatory Visit: Payer: Self-pay

## 2022-01-28 DIAGNOSIS — Z794 Long term (current) use of insulin: Secondary | ICD-10-CM | POA: Diagnosis not present

## 2022-01-28 DIAGNOSIS — R000 Tachycardia, unspecified: Secondary | ICD-10-CM | POA: Insufficient documentation

## 2022-01-28 DIAGNOSIS — J849 Interstitial pulmonary disease, unspecified: Secondary | ICD-10-CM

## 2022-01-28 DIAGNOSIS — Z7982 Long term (current) use of aspirin: Secondary | ICD-10-CM | POA: Insufficient documentation

## 2022-01-28 DIAGNOSIS — I959 Hypotension, unspecified: Secondary | ICD-10-CM | POA: Diagnosis present

## 2022-01-28 DIAGNOSIS — I10 Essential (primary) hypertension: Secondary | ICD-10-CM | POA: Insufficient documentation

## 2022-01-28 DIAGNOSIS — E119 Type 2 diabetes mellitus without complications: Secondary | ICD-10-CM | POA: Diagnosis not present

## 2022-01-28 DIAGNOSIS — E860 Dehydration: Secondary | ICD-10-CM

## 2022-01-28 LAB — CBC WITH DIFFERENTIAL/PLATELET
Abs Immature Granulocytes: 0.03 10*3/uL (ref 0.00–0.07)
Basophils Absolute: 0 10*3/uL (ref 0.0–0.1)
Basophils Relative: 1 %
Eosinophils Absolute: 0.7 10*3/uL — ABNORMAL HIGH (ref 0.0–0.5)
Eosinophils Relative: 10 %
HCT: 40.4 % (ref 39.0–52.0)
Hemoglobin: 12.8 g/dL — ABNORMAL LOW (ref 13.0–17.0)
Immature Granulocytes: 0 %
Lymphocytes Relative: 27 %
Lymphs Abs: 2 10*3/uL (ref 0.7–4.0)
MCH: 27.8 pg (ref 26.0–34.0)
MCHC: 31.7 g/dL (ref 30.0–36.0)
MCV: 87.8 fL (ref 80.0–100.0)
Monocytes Absolute: 0.4 10*3/uL (ref 0.1–1.0)
Monocytes Relative: 5 %
Neutro Abs: 4.3 10*3/uL (ref 1.7–7.7)
Neutrophils Relative %: 57 %
Platelets: 193 10*3/uL (ref 150–400)
RBC: 4.6 MIL/uL (ref 4.22–5.81)
RDW: 16.3 % — ABNORMAL HIGH (ref 11.5–15.5)
WBC: 7.4 10*3/uL (ref 4.0–10.5)
nRBC: 0 % (ref 0.0–0.2)

## 2022-01-28 LAB — HEPATIC FUNCTION PANEL
ALT: 15 U/L (ref 0–44)
AST: 17 U/L (ref 15–41)
Albumin: 2.7 g/dL — ABNORMAL LOW (ref 3.5–5.0)
Alkaline Phosphatase: 65 U/L (ref 38–126)
Bilirubin, Direct: 0.1 mg/dL (ref 0.0–0.2)
Indirect Bilirubin: 0.4 mg/dL (ref 0.3–0.9)
Total Bilirubin: 0.5 mg/dL (ref 0.3–1.2)
Total Protein: 7.3 g/dL (ref 6.5–8.1)

## 2022-01-28 LAB — BASIC METABOLIC PANEL
Anion gap: 6 (ref 5–15)
BUN: 15 mg/dL (ref 6–20)
CO2: 27 mmol/L (ref 22–32)
Calcium: 8.6 mg/dL — ABNORMAL LOW (ref 8.9–10.3)
Chloride: 102 mmol/L (ref 98–111)
Creatinine, Ser: 0.93 mg/dL (ref 0.61–1.24)
GFR, Estimated: >60 mL/min (ref >60)
Glucose, Bld: 271 mg/dL — ABNORMAL HIGH (ref 70–99)
Potassium: 4.2 mmol/L (ref 3.5–5.1)
Sodium: 135 mmol/L (ref 135–145)

## 2022-01-28 LAB — PROTIME-INR
INR: 1 (ref 0.8–1.2)
Prothrombin Time: 13.4 seconds (ref 11.4–15.2)

## 2022-01-28 LAB — LACTIC ACID, PLASMA: Lactic Acid, Venous: 1 mmol/L (ref 0.5–1.9)

## 2022-01-28 MED ORDER — SODIUM CHLORIDE 0.9 % IV BOLUS
1000.0000 mL | Freq: Once | INTRAVENOUS | Status: AC
  Administered 2022-01-28: 1000 mL via INTRAVENOUS

## 2022-01-28 MED ORDER — LACTATED RINGERS IV BOLUS
1000.0000 mL | Freq: Once | INTRAVENOUS | Status: AC
Start: 2022-01-28 — End: 2022-01-28
  Administered 2022-01-28: 1000 mL via INTRAVENOUS

## 2022-01-28 NOTE — ED Notes (Signed)
Pt states he is chronically on oxygen 2 L [place] at home

## 2022-01-28 NOTE — ED Notes (Signed)
BMET given to phlebotomist to take to lab

## 2022-01-28 NOTE — ED Notes (Addendum)
Pt's first set of lab work hemolyzed according to lab, First phlebotomist unable to obtain blood cultures or labs, Another RN attempted IV access on 2 different sites and was successful on both sitesl;However, RN was not successful in obtaining cultures or blood work via IV access. Another phlebotomist is at bedside attempting to obtain blood work and cultures--PA-C made aware

## 2022-01-28 NOTE — ED Notes (Signed)
Second phlebotomist unable to obtain blood cultures

## 2022-01-28 NOTE — ED Triage Notes (Signed)
Pt sent from PCP for evaluation of low blood pressure, in triage BP 97/53.

## 2022-01-28 NOTE — ED Notes (Signed)
Blood cultures discontinued

## 2022-01-28 NOTE — ED Notes (Signed)
Pt's IV in the RAC, in which LR bolus was infusing, infiltrated. Pt only got around 250 mL of the 1000 LR bolus in that IV. Bolus was stopped, IV removed, extremity elevated, warm compress applied. Bolus restarted in another site--PA-C made aware

## 2022-01-28 NOTE — ED Provider Notes (Signed)
[facility] EMERGENCY DEPARTMENT Provider Note   CSN: [id number] Arrival date & time: 01/28/22  1512     History Chief Complaint  Patient presents with   Hypotension    [name] is a 46 y.o. male patient with history of hyperlipidemia, hypertension, diabetes, chronic respiratory failure on 2 L of oxygen normally who presents to the emergency department with low blood pressure.  Patient was at his primary care doctor for follow-up visit after being seen here in the ED a couple of days ago.  Patient was found to be hypotensive and tachycardic and sent to the ED for further evaluation.  Patient feels about the same from when he was initially seen here.  He reports associated headache, palpitations, generalized weakness.  He denies any hematochezia, melena, hematemesis.  Denies any pain anywhere.  No nausea, vomiting, or diarrhea.  No cough, congestion, fever, chills.  Chart review reveals that the patient has been having unintentional weight loss over several years.  HPI     Home Medications Prior to Admission medications   Medication Sig Start Date End Date Taking? Authorizing Provider  Accu-Chek FastClix Lancets MISC USE TO TEST BLOOD SUGAR FOUR TIMES DAILY. 07/16/20   Cassandria Anger, MD  ACCU-CHEK GUIDE test strip USE TO CHECK BLOOD SUGAR FOUR TIMES DAILY 11/21/21   Cassandria Anger, MD  acetaminophen (TYLENOL) 500 MG tablet Take 500-1,000 mg by mouth See admin instructions. Take 1-2 tablets (500-1,000 mg) by mouth every 6 hours as needed for pain & take 2 tablets (1000 mg) by mouth SCHEDULED at bedtime.    [provider]  albuterol (VENTOLIN HFA) 108 (90 Base) MCG/ACT inhaler Inhale 2 puffs into the lungs every 6 (six) hours as needed. Patient taking differently: Inhale 2 puffs into the lungs every 6 (six) hours as needed for shortness of breath or wheezing. 12/28/19   Roxan Hockey, MD  aspirin EC 81 MG EC tablet Take 1 tablet (81 mg total) by mouth  daily with breakfast. 12/28/19   Roxan Hockey, MD  BD VEO INSULIN SYRINGE U/F 31G X 15/64" 1 ML MISC USE AS DIRECTED PER PROVIDER TO INJECT INSULIN DAILY. 01/14/21   Cassandria Anger, MD  chlorthalidone (HYGROTON) 25 MG tablet Take 25 mg by mouth at bedtime. 09/11/20   [provider]  divalproex (DEPAKOTE) 500 MG DR tablet Take 2 tablets (1,000 mg total) by mouth at bedtime. 12/28/19   Roxan Hockey, MD  fenofibrate (TRICOR) 145 MG tablet Take 145 mg by mouth at bedtime. 09/11/20   [provider]  ferrous sulfate 325 (65 FE) MG tablet Take 325 mg by mouth daily with breakfast.    [provider]  furosemide (LASIX) 40 MG tablet Take 40 mg by mouth 2 (two) times daily. 10/03/21   [provider]  insulin glargine (LANTUS SOLOSTAR) 100 UNIT/ML Solostar Pen Inject 20 Units into the skin at bedtime. 10/21/21   Cassandria Anger, MD  Insulin Pen Needle (SURE COMFORT PEN NEEDLES) 31G X 8 MM MISC USE TO INJECT INSULIN FOUR TIMES DAILY. 07/24/21   Cassandria Anger, MD  Insulin Syringe-Needle U-100 (SURE COMFORT INSULIN SYRINGE) 30G X 1/2" 1 ML MISC USE AS DIRECTED PER PROVIDER TO INJECT INSULIN DAILY. 12/28/19   Emokpae, Courage, MD  ipratropium-albuterol (DUONEB) 0.5-2.5 (3) MG/3ML SOLN Take 3 mLs by nebulization every 6 (six) hours as needed. 11/11/21   Chesley Mires, MD  lisinopril (ZESTRIL) 2.5 MG tablet Take 2.5 mg by mouth daily. 04/30/21  [provider]  metoCLOPramide (REGLAN) 10 MG tablet Take 10 mg by mouth 4 (four) times daily. 08/15/21   [provider]  midodrine (PROAMATINE) 5 MG tablet Take 1 tablet (5 mg total) by mouth 3 (three) times daily with meals. 11/03/20   Orson Eva, MD  Multiple Vitamin (MULTIVITAMIN WITH MINERALS) TABS tablet Take 1 tablet by mouth in the morning. One-A-Day Men's Multivitamin    [provider]  nicotine (NICODERM CQ) 7 mg/24hr patch Place 1 patch (7 mg total) onto the skin daily. 10/21/21    Cassandria Anger, MD  omeprazole (PRILOSEC) 20 MG capsule Take 20 mg by mouth at bedtime.    [provider]  predniSONE (DELTASONE) 5 MG tablet Take 1 tablet (5 mg total) by mouth daily with breakfast. 08/26/21   Chesley Mires, MD  risperidone (RISPERDAL) 4 MG tablet Take 4 mg by mouth at bedtime.     [provider]  simvastatin (ZOCOR) 20 MG tablet Take 20 mg by mouth at bedtime.    [provider]  solifenacin (VESICARE) 5 MG tablet Take 5 mg by mouth at bedtime.     [provider]  SYMBICORT 160-4.5 MCG/ACT inhaler Inhale 2 puffs into the lungs 2 (two) times daily. 12/28/19   Roxan Hockey, MD      Allergies    Patient has no known allergies.    Review of Systems   Review of Systems  All other systems reviewed and are negative.   Physical Exam Updated Vital Signs BP (!) 97/53   Pulse (!) 117   Temp 98.2 F (36.8 C) (Oral)   Resp 18   Ht '6\' 1"'$  (1.854 m)   Wt 64.9 kg   SpO2 94%   BMI 18.87 kg/m  Physical Exam Vitals and nursing note reviewed.  Constitutional:      General: He is not in acute distress.    Appearance: Normal appearance.     Comments: Chronically ill appearing.   HENT:     Head: Normocephalic and atraumatic.  Eyes:     General:        Right eye: No discharge.        Left eye: No discharge.  Cardiovascular:     Rate and Rhythm: Regular rhythm. Tachycardia present.     Comments: S1/S2 are distinct without any evidence of murmur, rubs, or gallops.  Radial pulses are 2+ bilaterally.  Dorsalis pedis pulses are 2+ bilaterally.  No evidence of pedal edema. Pulmonary:     Comments: Clear to auscultation bilaterally.  Normal effort.  No respiratory distress.  No evidence of wheezes, rales, or rhonchi heard throughout. Abdominal:     General: Abdomen is flat. Bowel sounds are normal. There is no distension.     Tenderness: There is no abdominal tenderness. There is no guarding or rebound.  Musculoskeletal:         General: Normal range of motion.     Cervical back: Neck supple.  Skin:    General: Skin is warm and dry.     Findings: No rash.  Neurological:     General: No focal deficit present.     Mental Status: He is alert.  Psychiatric:        Mood and Affect: Mood normal.        Behavior: Behavior normal.     ED Results / Procedures / Treatments   Labs (all labs ordered are listed, but only abnormal results are displayed) Labs Reviewed  CULTURE,  BLOOD (ROUTINE X 2)  CULTURE, BLOOD (ROUTINE X 2)  BASIC METABOLIC PANEL  CBC WITH DIFFERENTIAL/PLATELET  LACTIC ACID, PLASMA  LACTIC ACID, PLASMA  PROTIME-INR  URINALYSIS, ROUTINE W REFLEX MICROSCOPIC  HEPATIC FUNCTION PANEL    EKG None  Radiology No results found.  Procedures Procedures    Medications Ordered in ED Medications  lactated ringers bolus 1,000 mL (has no administration in time range)    ED Course/ Medical Decision Making/ A&P Clinical Course as of 01/28/22 1853  Tue Jan 28, 2022  1842 I was notified by nursing staff that the patient's IV access was blown.  The first IV infiltrated fluids and phlebotomy was unable to get blood x2.  In addition, his initial labs hemolyzed apart from the CBC, PT/INR, and lactic.  Patient did however get 1 L bolus of fluid.  We will plan to give the patient a second liter of fluids and get a repeat BMP. [CF]  1843 Chart review of the patient reveals that the patient he had a CT chest abdomen pelvis couple of days ago which did reveal sarcoidosis.  This is likely the cause of his chronically ill appearance. [CF]  1843 CBC with Differential(!) No evidence of leukocytosis or significant anemia. [CF]  1843 Protime-INR Normal.  [CF]  1845 Lactic acid, plasma Normal.  [CF]    Clinical Course User Index [CF] Hendricks Limes, PA-C                           Medical Decision Making LAITHAN CONCHAS is a 46 y.o. male patient who presents the emergency department for further  evaluation of hypotension and tachycardia.  Given him labs and fluids.  As highlighted in ED course, patient had some trouble getting labs his vital signs improved after liter of fluid.  I will plan to give him a second liter and will wait for BMP to result.  Patient does not appear acutely ill at this time.  Due to shift change the rest of his care will be transferred to Dr. Roderic Palau or ultimate disposition will be made once BMP results.  I feel that the patient will likely be able to go home [place] no abnormalities.   Amount and/or Complexity of Data Reviewed Labs: ordered. Decision-making details documented in ED Course. Radiology: ordered.    Final Clinical Impression(s) / ED Diagnoses Final diagnoses:  None    Rx / DC Orders ED Discharge Orders     None         Cherrie Gauze 01/28/22 1854    Milton Ferguson, MD 01/29/22 [phone]

## 2022-01-28 NOTE — Discharge Instructions (Signed)
Follow-up with your lung specialist or your family doctor.  Drink plenty of fluids

## 2022-01-28 NOTE — ED Notes (Signed)
Discharge instructions reviewed with the patient. No questions or concerns to report. Patient discharged.  

## 2022-01-29 ENCOUNTER — Telehealth: Payer: Self-pay | Admitting: Pulmonary Disease

## 2022-01-29 NOTE — Telephone Encounter (Signed)
That's fine

## 2022-01-29 NOTE — Telephone Encounter (Signed)
Called and spoke to patient. Added him to schedule for 02/05/22 at 2:30 pm. Nothing further needed.

## 2022-01-29 NOTE — Telephone Encounter (Signed)
Patient is asking for a sooner appt than Oct.(Next available) In [facility] office for follow up on recent ED visit.   Dr. Halford Chessman please advise, should we add patient on to an afternoon in [place] or schedule for next available in October?

## 2022-02-03 LAB — CULTURE, BLOOD (ROUTINE X 2): Culture: NO GROWTH

## 2022-02-04 ENCOUNTER — Ambulatory Visit: Payer: Medicaid Other | Admitting: "Endocrinology

## 2022-02-05 ENCOUNTER — Telehealth: Payer: Self-pay | Admitting: Pulmonary Disease

## 2022-02-05 ENCOUNTER — Inpatient Hospital Stay: Payer: Medicaid Other | Admitting: Pulmonary Disease

## 2022-02-05 DIAGNOSIS — J849 Interstitial pulmonary disease, unspecified: Secondary | ICD-10-CM

## 2022-02-05 NOTE — Telephone Encounter (Signed)
Called and notified patient of response from Dr. Halford Chessman. He voiced understanding. Order placed. Nothing further needed

## 2022-02-05 NOTE — Telephone Encounter (Signed)
Called patient and he states he wants [place] to order a POC for him to use when he travels to doctors appts.  I did inform patient that we normally have to do a walk test in the office before we can send an order for any kind of portable oxygen. Patient was supposed to be seen in office today but rescheduled to November. Suggested that we could walk patient at his appt in November but he states he will need oxygen before then. Refused an appt in [place] office.  Patient is requesting that we try to place the order for a POC without the walk first. He states he has been using 3LO2 around the house with his concentrator and was instructed to do so at his last ED visit. Dr. Halford Chessman please advise.

## 2022-02-05 NOTE — Telephone Encounter (Signed)
Can try putting in order for POC at 3 liters with [facility].  Just let him know that insurance might deny coverage for this without having required documentation proving he needs supplemental oxygen during the day.

## 2022-02-10 ENCOUNTER — Ambulatory Visit (INDEPENDENT_AMBULATORY_CARE_PROVIDER_SITE_OTHER): Payer: Medicaid Other | Admitting: Gastroenterology

## 2022-02-24 ENCOUNTER — Encounter (HOSPITAL_COMMUNITY)
Admission: RE | Admit: 2022-02-24 | Discharge: 2022-02-24 | Disposition: A | Discharge status: Home / Self Care | Payer: Medicaid Other | Source: Ambulatory Visit | Attending: Ophthalmology | Admitting: Ophthalmology

## 2022-02-25 NOTE — H&P (Signed)
Surgical History & Physical  Patient Name: [name] DOB: Feb 02, 1976  Surgery: Cataract extraction with intraocular lens implant phacoemulsification; Left Eye  Surgeon: Baruch Goldmann MD Surgery Date:  02-28-22 Pre-Op Date:  02-17-22  HPI: A 53 Yr. old male patient is referred by Dr Jorja Loa foe cataract eval. 1. 1. The patient complains of difficulty when driving, which began 1 year ago. Both eyes are affected. The episode is gradual. The condition's severity increased since last visit. Symptoms occur when the patient is driving, inside, outside and reading. The complaint is associated with glare. This is negatively affecting the patient's quality of life and the patient is unable to function adequately in life with the current level of vision. HPI was performed by Baruch Goldmann .  Medical History: Cataracts Diabetes LDL  Review of Systems Negative Allergic/Immunologic Negative Cardiovascular Negative Constitutional Negative Ear, Nose, Mouth & Throat Negative Endocrine Negative Eyes Negative Gastrointestinal Negative Genitourinary Negative Hemotologic/Lymphatic Negative Integumentary Negative Musculoskeletal Negative Neurological Negative Psychiatry Negative Respiratory  Social   Current every day smoker   Medication  Depakote, NovoLog, Lantus, Resperdal, Vesicare, Zocor,   Sx/Procedures Knee Surgery, Appendectomy, Gallbladder Removal,   Drug Allergies   NKDA  History & Physical: Heent: Cataract, left eye NECK: supple without bruits LUNGS: lungs clear to auscultation CV: regular rate and rhythm Abdomen: soft and non-tender Impression & Plan: Assessment: 1.  COMBINED FORMS AGE RELATED CATARACT; Left Eye (H25.812) 2.  NUCLEAR SCLEROSIS AGE RELATED; Right Eye (H25.11) 3.  BLEPHARITIS; Right Upper Lid, Right Lower Lid, Left Upper Lid, Left Lower Lid (H01.001, H01.002,H01.004,H01.005)  Plan: 1.  Cataract accounts for the patient's decreased vision. This  visual impairment is not correctable with a tolerable change in glasses or contact lenses. Cataract surgery with an implantation of a new lens should significantly improve the visual and functional status of the patient. Discussed all risks, benefits, alternatives, and potential complications. Discussed the procedures and recovery. Patient desires to have surgery. A-scan ordered and performed today for intra-ocular lens calculations. The surgery will be performed in order to improve vision for driving, reading, and for eye examinations. Recommend phacoemulsification with intra-ocular lens. Recommend Dextenza for post-operative pain and inflammation. Left Eye worse - first. Dilates well - shugarcaine by protocol. Declines Multifocal IOL.  2.  Will address after left eye.  3.  Recommend regular lid cleaning

## 2022-02-28 ENCOUNTER — Ambulatory Visit (HOSPITAL_BASED_OUTPATIENT_CLINIC_OR_DEPARTMENT_OTHER): Payer: Medicaid Other | Admitting: Anesthesiology

## 2022-02-28 ENCOUNTER — Ambulatory Visit (HOSPITAL_COMMUNITY)
Admission: RE | Admit: 2022-02-28 | Discharge: 2022-02-28 | Disposition: A | Discharge status: Home / Self Care | Payer: Medicaid Other | Attending: Ophthalmology | Admitting: Ophthalmology

## 2022-02-28 ENCOUNTER — Ambulatory Visit (HOSPITAL_COMMUNITY): Payer: Medicaid Other | Admitting: Anesthesiology

## 2022-02-28 ENCOUNTER — Encounter (HOSPITAL_COMMUNITY)
Admission: RE | Disposition: A | Discharge status: Home / Self Care | Payer: Self-pay | Source: Home / Self Care | Attending: Ophthalmology

## 2022-02-28 ENCOUNTER — Encounter (HOSPITAL_COMMUNITY): Payer: Self-pay | Admitting: Ophthalmology

## 2022-02-28 DIAGNOSIS — K219 Gastro-esophageal reflux disease without esophagitis: Secondary | ICD-10-CM | POA: Diagnosis not present

## 2022-02-28 DIAGNOSIS — E1136 Type 2 diabetes mellitus with diabetic cataract: Secondary | ICD-10-CM | POA: Insufficient documentation

## 2022-02-28 DIAGNOSIS — F17200 Nicotine dependence, unspecified, uncomplicated: Secondary | ICD-10-CM | POA: Insufficient documentation

## 2022-02-28 DIAGNOSIS — I10 Essential (primary) hypertension: Secondary | ICD-10-CM | POA: Diagnosis not present

## 2022-02-28 DIAGNOSIS — J449 Chronic obstructive pulmonary disease, unspecified: Secondary | ICD-10-CM | POA: Diagnosis not present

## 2022-02-28 DIAGNOSIS — H2511 Age-related nuclear cataract, right eye: Secondary | ICD-10-CM | POA: Diagnosis not present

## 2022-02-28 DIAGNOSIS — D649 Anemia, unspecified: Secondary | ICD-10-CM | POA: Insufficient documentation

## 2022-02-28 DIAGNOSIS — H0100B Unspecified blepharitis left eye, upper and lower eyelids: Secondary | ICD-10-CM | POA: Insufficient documentation

## 2022-02-28 DIAGNOSIS — F17210 Nicotine dependence, cigarettes, uncomplicated: Secondary | ICD-10-CM | POA: Diagnosis not present

## 2022-02-28 DIAGNOSIS — H25812 Combined forms of age-related cataract, left eye: Secondary | ICD-10-CM | POA: Diagnosis not present

## 2022-02-28 DIAGNOSIS — Z9981 Dependence on supplemental oxygen: Secondary | ICD-10-CM | POA: Diagnosis not present

## 2022-02-28 DIAGNOSIS — F319 Bipolar disorder, unspecified: Secondary | ICD-10-CM | POA: Insufficient documentation

## 2022-02-28 DIAGNOSIS — H0100A Unspecified blepharitis right eye, upper and lower eyelids: Secondary | ICD-10-CM | POA: Diagnosis not present

## 2022-02-28 HISTORY — PX: CATARACT EXTRACTION W/PHACO: SHX586

## 2022-02-28 LAB — GLUCOSE, CAPILLARY: Glucose-Capillary: 247 mg/dL — ABNORMAL HIGH (ref 70–99)

## 2022-02-28 SURGERY — PHACOEMULSIFICATION, CATARACT, WITH IOL INSERTION
Anesthesia: Monitor Anesthesia Care | Site: Eye | Laterality: Left

## 2022-02-28 MED ORDER — EPINEPHRINE PF 1 MG/ML IJ SOLN
INTRAOCULAR | Status: DC | PRN
  Administered 2022-02-28: 500 mL

## 2022-02-28 MED ORDER — SODIUM HYALURONATE 23MG/ML IO SOSY
PREFILLED_SYRINGE | INTRAOCULAR | Status: DC | PRN
  Administered 2022-02-28: 0.6 mL via INTRAOCULAR

## 2022-02-28 MED ORDER — STERILE WATER FOR IRRIGATION IR SOLN
Status: DC | PRN
  Administered 2022-02-28: 50 mL

## 2022-02-28 MED ORDER — BSS IO SOLN
INTRAOCULAR | Status: DC | PRN
  Administered 2022-02-28: 15 mL via INTRAOCULAR

## 2022-02-28 MED ORDER — EPINEPHRINE PF 1 MG/ML IJ SOLN
INTRAMUSCULAR | Status: AC
  Filled 2022-02-28: qty 2

## 2022-02-28 MED ORDER — LIDOCAINE HCL 3.5 % OP GEL
1.0000 | Freq: Once | OPHTHALMIC | Status: AC
  Administered 2022-02-28: 1 via OPHTHALMIC

## 2022-02-28 MED ORDER — SODIUM HYALURONATE 10 MG/ML IO SOLUTION
PREFILLED_SYRINGE | INTRAOCULAR | Status: DC | PRN
  Administered 2022-02-28: 0.85 mL via INTRAOCULAR

## 2022-02-28 MED ORDER — PHENYLEPHRINE HCL 2.5 % OP SOLN
1.0000 [drp] | OPHTHALMIC | Status: AC | PRN
  Administered 2022-02-28 (×3): 1 [drp] via OPHTHALMIC

## 2022-02-28 MED ORDER — POVIDONE-IODINE 5 % OP SOLN
OPHTHALMIC | Status: DC | PRN
  Administered 2022-02-28: 1 via OPHTHALMIC

## 2022-02-28 MED ORDER — TROPICAMIDE 1 % OP SOLN
1.0000 [drp] | OPHTHALMIC | Status: AC | PRN
  Administered 2022-02-28 (×3): 1 [drp] via OPHTHALMIC

## 2022-02-28 MED ORDER — TETRACAINE HCL 0.5 % OP SOLN
1.0000 [drp] | OPHTHALMIC | Status: AC | PRN
  Administered 2022-02-28 (×3): 1 [drp] via OPHTHALMIC

## 2022-02-28 MED ORDER — MIDAZOLAM HCL 2 MG/2ML IJ SOLN
INTRAMUSCULAR | Status: AC
  Filled 2022-02-28: qty 2

## 2022-02-28 MED ORDER — LIDOCAINE HCL (PF) 1 % IJ SOLN
INTRAOCULAR | Status: DC | PRN
  Administered 2022-02-28: 1 mL via OPHTHALMIC

## 2022-02-28 MED ORDER — MOXIFLOXACIN HCL 0.5 % OP SOLN
OPHTHALMIC | Status: DC | PRN
  Administered 2022-02-28: 2 [drp] via OPHTHALMIC

## 2022-02-28 SURGICAL SUPPLY — 15 items
CATARACT SUITE SIGHTPATH (MISCELLANEOUS) ×1 IMPLANT
CLOTH BEACON ORANGE TIMEOUT ST (SAFETY) ×2 IMPLANT
DRAPE HALF SHEET 40X57 (DRAPES) IMPLANT
EYE SHIELD UNIVERSAL CLEAR (GAUZE/BANDAGES/DRESSINGS) IMPLANT
FEE CATARACT SUITE SIGHTPATH (MISCELLANEOUS) ×2 IMPLANT
GLOVE BIOGEL PI IND STRL 7.0 (GLOVE) ×4 IMPLANT
GLOVE SURG SS PI 7.0 STRL IVOR (GLOVE) IMPLANT
LENS IOL RAYNER 17.5 (Intraocular Lens) ×1 IMPLANT
LENS IOL RAYONE EMV 17.5 (Intraocular Lens) IMPLANT
NDL HYPO 18GX1.5 BLUNT FILL (NEEDLE) ×2 IMPLANT
NEEDLE HYPO 18GX1.5 BLUNT FILL (NEEDLE) ×1 IMPLANT
PAD ARMBOARD 7.5X6 YLW CONV (MISCELLANEOUS) ×2 IMPLANT
SYR TB 1ML LL NO SAFETY (SYRINGE) ×2 IMPLANT
TAPE SURG TRANSPORE 1 IN (GAUZE/BANDAGES/DRESSINGS) IMPLANT
TAPE SURGICAL TRANSPORE 1 IN (GAUZE/BANDAGES/DRESSINGS) ×1

## 2022-02-28 NOTE — Discharge Instructions (Signed)
Please discharge patient when stable, will follow up today with Dr. Rafaela Dinius at the [facility] office immediately following discharge.  Leave shield in place until visit.  All paperwork with discharge instructions will be given at the office.  [facility] Address:  [address]  [place], [place] [address]  

## 2022-02-28 NOTE — Interval H&P Note (Signed)
History and Physical Interval Note:  02/28/2022 10:39 AM  [name]  has presented today for surgery, with the diagnosis of combined forms age related cataract; left.  The various methods of treatment have been discussed with the patient and family. After consideration of risks, benefits and other options for treatment, the patient has consented to  Procedure(s) with comments: CATARACT EXTRACTION PHACO AND INTRAOCULAR LENS PLACEMENT ([place]) (Left) - CDE as a surgical intervention.  The patient's history has been reviewed, patient examined, no change in status, stable for surgery.  I have reviewed the patient's chart and labs.  Questions were answered to the patient's satisfaction.     Baruch Goldmann

## 2022-02-28 NOTE — Anesthesia Preprocedure Evaluation (Signed)
Anesthesia Evaluation  Patient identified by MRN, date of birth, ID band Patient awake    Reviewed: Allergy & Precautions, H&P , NPO status , Patient's Chart, lab work & pertinent test results, reviewed documented beta blocker date and time   Airway Mallampati: II  TM Distance: >3 FB Neck ROM: full    Dental no notable dental hx.    Pulmonary COPD,  oxygen dependent, Current Smoker and Patient abstained from smoking.,    Pulmonary exam normal breath sounds clear to auscultation       Cardiovascular Exercise Tolerance: Good hypertension, negative cardio ROS   Rhythm:regular Rate:Normal     Neuro/Psych PSYCHIATRIC DISORDERS Bipolar Disorder negative neurological ROS     GI/Hepatic Neg liver ROS, GERD  Medicated,  Endo/Other  negative endocrine ROSdiabetes, Type 2  Renal/GU Renal disease  negative genitourinary   Musculoskeletal   Abdominal   Peds  Hematology  (+) Blood dyscrasia, anemia ,   Anesthesia Other Findings   Reproductive/Obstetrics negative OB ROS                             Anesthesia Physical  Anesthesia Plan  ASA: 3  Anesthesia Plan: MAC   Post-op Pain Management:    Induction:   PONV Risk Score and Plan:   Airway Management Planned:   Additional Equipment:   Intra-op Plan:   Post-operative Plan:   Informed Consent: I have reviewed the patients History and Physical, chart, labs and discussed the procedure including the risks, benefits and alternatives for the proposed anesthesia with the patient or authorized representative who has indicated his/her understanding and acceptance.     Dental Advisory Given  Plan Discussed with: CRNA  Anesthesia Plan Comments:         Anesthesia Quick Evaluation

## 2022-02-28 NOTE — Anesthesia Procedure Notes (Signed)
Date/Time: 02/28/2022 10:45 AM  Performed by: Vista Deck, CRNAPre-anesthesia Checklist: Patient identified, Emergency Drugs available, Suction available, Timeout performed and Patient being monitored Patient Re-evaluated:Patient Re-evaluated prior to induction Oxygen Delivery Method: Nasal Cannula

## 2022-02-28 NOTE — Op Note (Signed)
Date of procedure: 02/28/22  Pre-operative diagnosis: Visually significant age-related combined cataract, Left Eye (H25.812)  Post-operative diagnosis: Visually significant age-related combined cataract, Left Eye (H25.812)  Procedure: Removal of cataract via phacoemulsification and insertion of intra-ocular lens Rayner [id number] +17.5D into the capsular bag of the Left Eye  Attending surgeon: Gerda Diss. Yanelis Osika, MD, MA  Anesthesia: MAC, Topical Akten  Complications: None  Estimated Blood Loss: <17m (minimal)  Specimens: None  Implants: As above  Indications:  Visually significant age-related cataract, Left Eye  Procedure:  The patient was seen and identified in the pre-operative area. The operative eye was identified and dilated.  The operative eye was marked.  Topical anesthesia was administered to the operative eye.     The patient was then to the operative suite and placed in the supine position.  A timeout was performed confirming the patient, procedure to be performed, and all other relevant information.   The patient's face was prepped and draped in the usual fashion for intra-ocular surgery.  A lid speculum was placed into the operative eye and the surgical microscope moved into place and focused.  An inferotemporal paracentesis was created using a 20 gauge paracentesis blade.  Shugarcaine was injected into the anterior chamber.  Viscoelastic was injected into the anterior chamber.  A temporal clear-corneal main wound incision was created using a 2.428mmicrokeratome.  A continuous curvilinear capsulorrhexis was initiated using an irrigating cystitome and completed using capsulorrhexis forceps.  Hydrodissection and hydrodeliniation were performed.  Viscoelastic was injected into the anterior chamber.  A phacoemulsification handpiece and a chopper as a second instrument were used to remove the nucleus and epinucleus. The irrigation/aspiration handpiece was used to remove any remaining  cortical material.   The capsular bag was reinflated with viscoelastic, checked, and found to be intact.  The intraocular lens was inserted into the capsular bag.  The irrigation/aspiration handpiece was used to remove any remaining viscoelastic.  The clear corneal wound and paracentesis wounds were then hydrated and checked with Weck-Cels to be watertight.  Moxifloxacin drops were instilled on the eye. The lid-speculum was removed.  The drape was removed.  The patient's face was cleaned with a wet and dry 4x4.    A clear shield was taped over the eye. The patient was taken to the post-operative care unit in good condition, having tolerated the procedure well.  Post-Op Instructions: The [facility] post-operative evaluation and will receive all other orders and instructions..Marland Kitchen

## 2022-02-28 NOTE — Transfer of Care (Signed)
Immediate Anesthesia Transfer of Care Note  Patient: [name]  Procedure(s) Performed: CATARACT EXTRACTION PHACO AND INTRAOCULAR LENS PLACEMENT ([facility]) (Left: Eye)  Patient Location: Short Stay  Anesthesia Type:MAC  Level of Consciousness: awake and patient cooperative  Airway & Oxygen Therapy: Patient Spontanous Breathing and Patient connected to nasal cannula oxygen  Post-op Assessment: Report given to RN and Post -op Vital signs reviewed and stable  Post vital signs: Reviewed and stable  Last Vitals:  Vitals Value Taken Time  BP 107/74 02/28/22 1106  Temp 36.6 C 02/28/22 1106  Pulse 87 02/28/22 1106  Resp 20 02/28/22 1106  SpO2 98 1106    Last Pain:  Vitals:   02/28/22 1106  TempSrc: Oral  PainSc: 0-No pain      Patients Stated Pain Goal: 8 (61/60/73 7106)  Complications: No notable events documented.

## 2022-03-01 NOTE — Anesthesia Postprocedure Evaluation (Signed)
Anesthesia Post Note  Patient: [name]  Procedure(s) Performed: CATARACT EXTRACTION PHACO AND INTRAOCULAR LENS PLACEMENT ([facility]) (Left: Eye)  Patient location during evaluation: Phase II Anesthesia Type: MAC Level of consciousness: awake Pain management: pain level controlled Vital Signs Assessment: post-procedure vital signs reviewed and stable Respiratory status: spontaneous breathing and respiratory function stable Cardiovascular status: blood pressure returned to baseline and stable Postop Assessment: no headache and no apparent nausea or vomiting Anesthetic complications: no Comments: Late entry   No notable events documented.   Last Vitals:  Vitals:   02/28/22 1015 02/28/22 1106  BP: 93/77 107/74  Pulse:  87  Resp: 18 20  Temp: 36.6 C 36.6 C  SpO2: 94%     Last Pain:  Vitals:   02/28/22 1106  TempSrc: Oral  PainSc: 0-No pain                 Louann Sjogren

## 2022-03-03 ENCOUNTER — Encounter (HOSPITAL_COMMUNITY): Payer: Self-pay | Admitting: Ophthalmology

## 2022-03-14 ENCOUNTER — Encounter (HOSPITAL_COMMUNITY)
Admission: RE | Admit: 2022-03-14 | Discharge: 2022-03-14 | Disposition: A | Discharge status: Home / Self Care | Payer: Medicaid Other | Source: Ambulatory Visit | Attending: Ophthalmology | Admitting: Ophthalmology

## 2022-03-18 NOTE — H&P (Signed)
Surgical History & Physical  Patient Name: [name] DOB: March 25, 1976  Surgery: Cataract extraction with intraocular lens implant phacoemulsification; Right Eye  Surgeon: Omar Goldmann MD Surgery Date:  03-24-22 Pre-Op Date:  03-03-22  HPI: A 71 Yr. old male patient 1. The patient is returning after cataract post-op. The left eye is affected. Status post cataract post-op, which began 1 week ago: Since the last visit, the affected area is doing well. The patient's vision is stable. Patient is following medication instructions: Ilevro qd, Moxifloxacin TID, and Prednisolone TID OS. 2. 2. Patient is having difficulties watching TV, reading, seeing due to glare during the day and at night, and seeing street signs. This is negatively affecting the patient's quality of life and the patient is unable to function adequately in life with the current level of vision. HPI was performed by [name] .  Medical History: Cataracts Diabetes LDL  Review of Systems Negative Allergic/Immunologic Negative Cardiovascular Negative Constitutional Negative Ear, Nose, Mouth & Throat Negative Endocrine Negative Eyes Negative Gastrointestinal Negative Genitourinary Negative Hemotologic/Lymphatic Negative Integumentary Negative Musculoskeletal Negative Neurological Negative Psychiatry Negative Respiratory  Social   Current every day smoker / Cigarettes   Medication Moxifloxacin, Ilevro, Prednisolone acetate 1%,  Depakote, NovoLog, Lantus, Resperdal, Vesicare, Zocor,   Sx/Procedures Phaco c IOL OS,  Knee Surgery, Appendectomy, Gallbladder Removal,   Drug Allergies   NKDA  History & Physical: Heent: Cataract, right eye NECK: supple without bruits LUNGS: lungs clear to auscultation CV: regular rate and rhythm Abdomen: soft and non-tender Impression & Plan: Assessment: 1.  CATARACT EXTRACTION STATUS; Left Eye (Z98.42) 2.  NUCLEAR SCLEROSIS AGE RELATED; Right Eye (H25.11)  Plan:  1.  4 days after cataract surgery. Doing well with improved vision and normal eye pressure. Call with any problems or concerns. Continue Vigamox 3x/day for 4 more days then stop, Ilevo 1x/day for 3.5 weeks, pred acetate 3x/day for 4 days, then 2x/day for 3 weeks.  2.  Cataract accounts for the patient's decreased vision. This visual impairment is not correctable with a tolerable change in glasses or contact lenses. Cataract surgery with an implantation of a new lens should significantly improve the visual and functional status of the patient. Discussed all risks, benefits, alternatives, and potential complications. Discussed the procedures and recovery. Patient desires to have surgery. A-scan ordered and performed today for intra-ocular lens calculations. The surgery will be performed in order to improve vision for driving, reading, and for eye examinations. Recommend phacoemulsification with intra-ocular lens. Recommend Dextenza for post-operative pain and inflammation. Right Eye. Surgery required to correct imbalance of vision. Dilates poorly - shugarcaine by protocol. Malyugin Ring. [name].

## 2022-03-24 ENCOUNTER — Ambulatory Visit (HOSPITAL_COMMUNITY): Payer: Medicaid Other | Admitting: Anesthesiology

## 2022-03-24 ENCOUNTER — Ambulatory Visit (HOSPITAL_COMMUNITY)
Admission: RE | Admit: 2022-03-24 | Discharge: 2022-03-24 | Disposition: A | Discharge status: Home / Self Care | Payer: Medicaid Other | Attending: Ophthalmology | Admitting: Ophthalmology

## 2022-03-24 ENCOUNTER — Ambulatory Visit (HOSPITAL_BASED_OUTPATIENT_CLINIC_OR_DEPARTMENT_OTHER): Payer: Medicaid Other | Admitting: Anesthesiology

## 2022-03-24 ENCOUNTER — Encounter (HOSPITAL_COMMUNITY): Payer: Self-pay | Admitting: Ophthalmology

## 2022-03-24 ENCOUNTER — Encounter (HOSPITAL_COMMUNITY)
Admission: RE | Disposition: A | Discharge status: Home / Self Care | Payer: Self-pay | Source: Home / Self Care | Attending: Ophthalmology

## 2022-03-24 DIAGNOSIS — F17210 Nicotine dependence, cigarettes, uncomplicated: Secondary | ICD-10-CM

## 2022-03-24 DIAGNOSIS — I10 Essential (primary) hypertension: Secondary | ICD-10-CM

## 2022-03-24 DIAGNOSIS — F319 Bipolar disorder, unspecified: Secondary | ICD-10-CM | POA: Diagnosis not present

## 2022-03-24 DIAGNOSIS — H25811 Combined forms of age-related cataract, right eye: Secondary | ICD-10-CM | POA: Diagnosis not present

## 2022-03-24 DIAGNOSIS — Z9981 Dependence on supplemental oxygen: Secondary | ICD-10-CM | POA: Insufficient documentation

## 2022-03-24 DIAGNOSIS — E1136 Type 2 diabetes mellitus with diabetic cataract: Secondary | ICD-10-CM | POA: Diagnosis not present

## 2022-03-24 DIAGNOSIS — K219 Gastro-esophageal reflux disease without esophagitis: Secondary | ICD-10-CM | POA: Insufficient documentation

## 2022-03-24 HISTORY — PX: CATARACT EXTRACTION W/PHACO: SHX586

## 2022-03-24 LAB — GLUCOSE, CAPILLARY: Glucose-Capillary: 189 mg/dL — ABNORMAL HIGH (ref 70–99)

## 2022-03-24 SURGERY — PHACOEMULSIFICATION, CATARACT, WITH IOL INSERTION
Anesthesia: Monitor Anesthesia Care | Site: Eye | Laterality: Right

## 2022-03-24 MED ORDER — MIDAZOLAM HCL 2 MG/2ML IJ SOLN
INTRAMUSCULAR | Status: AC
  Filled 2022-03-24: qty 2

## 2022-03-24 MED ORDER — SODIUM HYALURONATE 10 MG/ML IO SOLUTION
PREFILLED_SYRINGE | INTRAOCULAR | Status: DC | PRN
  Administered 2022-03-24: 0.85 mL via INTRAOCULAR

## 2022-03-24 MED ORDER — LIDOCAINE HCL (PF) 1 % IJ SOLN
INTRAOCULAR | Status: DC | PRN
  Administered 2022-03-24: 1 mL via OPHTHALMIC

## 2022-03-24 MED ORDER — EPINEPHRINE PF 1 MG/ML IJ SOLN
INTRAMUSCULAR | Status: AC
  Filled 2022-03-24: qty 1

## 2022-03-24 MED ORDER — EPINEPHRINE PF 1 MG/ML IJ SOLN
INTRAMUSCULAR | Status: AC
  Filled 2022-03-24: qty 2

## 2022-03-24 MED ORDER — MIDAZOLAM HCL 2 MG/2ML IJ SOLN
INTRAMUSCULAR | Status: DC | PRN
  Administered 2022-03-24: 2 mg via INTRAVENOUS

## 2022-03-24 MED ORDER — SODIUM CHLORIDE 0.9% FLUSH
INTRAVENOUS | Status: DC | PRN
  Administered 2022-03-24: 5 mL via INTRAVENOUS

## 2022-03-24 MED ORDER — TETRACAINE HCL 0.5 % OP SOLN
1.0000 [drp] | OPHTHALMIC | Status: AC | PRN
  Administered 2022-03-24 (×3): 1 [drp] via OPHTHALMIC

## 2022-03-24 MED ORDER — BSS IO SOLN
INTRAOCULAR | Status: DC | PRN
  Administered 2022-03-24: 15 mL via INTRAOCULAR

## 2022-03-24 MED ORDER — PHENYLEPHRINE-KETOROLAC 1-0.3 % IO SOLN
INTRAOCULAR | Status: AC
  Filled 2022-03-24: qty 4

## 2022-03-24 MED ORDER — SODIUM HYALURONATE 23MG/ML IO SOSY
PREFILLED_SYRINGE | INTRAOCULAR | Status: DC | PRN
  Administered 2022-03-24: 0.6 mL via INTRAOCULAR

## 2022-03-24 MED ORDER — PHENYLEPHRINE-KETOROLAC 1-0.3 % IO SOLN
INTRAOCULAR | Status: DC | PRN
  Administered 2022-03-24: 500 mL via OPHTHALMIC

## 2022-03-24 MED ORDER — TROPICAMIDE 1 % OP SOLN
1.0000 [drp] | OPHTHALMIC | Status: AC | PRN
  Administered 2022-03-24 (×3): 1 [drp] via OPHTHALMIC

## 2022-03-24 MED ORDER — POVIDONE-IODINE 5 % OP SOLN
OPHTHALMIC | Status: DC | PRN
  Administered 2022-03-24: 1 via OPHTHALMIC

## 2022-03-24 MED ORDER — LIDOCAINE HCL 3.5 % OP GEL
1.0000 | Freq: Once | OPHTHALMIC | Status: AC
  Administered 2022-03-24: 1 via OPHTHALMIC

## 2022-03-24 MED ORDER — MOXIFLOXACIN HCL 0.5 % OP SOLN
OPHTHALMIC | Status: DC | PRN
  Administered 2022-03-24: 0.2 mL via OPHTHALMIC

## 2022-03-24 MED ORDER — MOXIFLOXACIN HCL 5 MG/ML IO SOLN
INTRAOCULAR | Status: AC
  Filled 2022-03-24: qty 1

## 2022-03-24 MED ORDER — STERILE WATER FOR IRRIGATION IR SOLN
Status: DC | PRN
  Administered 2022-03-24: 250 mL

## 2022-03-24 MED ORDER — PHENYLEPHRINE HCL 2.5 % OP SOLN
1.0000 [drp] | OPHTHALMIC | Status: AC | PRN
  Administered 2022-03-24 (×3): 1 [drp] via OPHTHALMIC

## 2022-03-24 SURGICAL SUPPLY — 15 items
CATARACT SUITE SIGHTPATH (MISCELLANEOUS) ×1 IMPLANT
CLOTH BEACON ORANGE TIMEOUT ST (SAFETY) ×2 IMPLANT
DRAPE HALF SHEET 40X57 (DRAPES) IMPLANT
EYE SHIELD UNIVERSAL CLEAR (GAUZE/BANDAGES/DRESSINGS) IMPLANT
FEE CATARACT SUITE SIGHTPATH (MISCELLANEOUS) ×2 IMPLANT
GLOVE BIOGEL PI IND STRL 7.0 (GLOVE) ×4 IMPLANT
LENS IOL RAYNER 17.5 (Intraocular Lens) ×1 IMPLANT
LENS IOL RAYONE EMV 17.5 (Intraocular Lens) IMPLANT
NDL HYPO 18GX1.5 BLUNT FILL (NEEDLE) ×2 IMPLANT
NEEDLE HYPO 18GX1.5 BLUNT FILL (NEEDLE) ×1 IMPLANT
PAD ARMBOARD 7.5X6 YLW CONV (MISCELLANEOUS) ×2 IMPLANT
SYR TB 1ML LL NO SAFETY (SYRINGE) ×2 IMPLANT
TAPE SURG TRANSPORE 1 IN (GAUZE/BANDAGES/DRESSINGS) IMPLANT
TAPE SURGICAL TRANSPORE 1 IN (GAUZE/BANDAGES/DRESSINGS) ×1
WATER STERILE IRR 250ML POUR (IV SOLUTION) ×2 IMPLANT

## 2022-03-24 NOTE — Anesthesia Postprocedure Evaluation (Signed)
Anesthesia Post Note  Patient: [name]  Procedure(s) Performed: CATARACT EXTRACTION PHACO AND INTRAOCULAR LENS PLACEMENT (IOC) (Right: Eye)  Patient location during evaluation: Phase II Anesthesia Type: MAC Level of consciousness: awake Pain management: pain level controlled Vital Signs Assessment: post-procedure vital signs reviewed and stable Respiratory status: spontaneous breathing and respiratory function stable Cardiovascular status: blood pressure returned to baseline and stable Postop Assessment: no headache and no apparent nausea or vomiting Anesthetic complications: no Comments: Late entry   No notable events documented.   Last Vitals:  Vitals:   03/24/22 0826 03/24/22 0910  BP: 92/71 102/80  Pulse: 99 (!) 104  Resp: 18 (!) 22  Temp: 36.6 C 36.6 C  SpO2: 100% 97%    Last Pain:  Vitals:   03/24/22 0910  TempSrc: Oral  PainSc: 0-No pain                 Louann Sjogren

## 2022-03-24 NOTE — Transfer of Care (Signed)
Immediate Anesthesia Transfer of Care Note  Patient: [name]  Procedure(s) Performed: CATARACT EXTRACTION PHACO AND INTRAOCULAR LENS PLACEMENT (IOC) (Right: Eye)  Patient Location: PACU  Anesthesia Type:MAC  Level of Consciousness: awake, alert , oriented and patient cooperative  Airway & Oxygen Therapy: Patient Spontanous Breathing  Post-op Assessment: Report given to RN, Post -op Vital signs reviewed and stable and Patient moving all extremities X 4  Post vital signs: Reviewed and stable  Last Vitals:  Vitals Value Taken Time  BP    Temp    Pulse    Resp    SpO2      Last Pain:  Vitals:   03/24/22 0826  TempSrc: Oral  PainSc: 0-No pain         Complications: No notable events documented.

## 2022-03-24 NOTE — Discharge Instructions (Addendum)
Please discharge patient when stable, will follow up today with Dr. Wrzosek at the [facility] [place] office immediately following discharge.  Leave shield in place until visit.  All paperwork with discharge instructions will be given at the office.  [facility] Address:  [address]  [place], [place] [address]  

## 2022-03-24 NOTE — Anesthesia Preprocedure Evaluation (Signed)
Anesthesia Evaluation  Patient identified by MRN, date of birth, ID band Patient awake    Reviewed: Allergy & Precautions, H&P , NPO status , Patient's Chart, lab work & pertinent test results, reviewed documented beta blocker date and time   Airway Mallampati: II  TM Distance: >3 FB Neck ROM: full    Dental no notable dental hx.    Pulmonary neg pulmonary ROS,  oxygen dependent, Current Smoker and Patient abstained from smoking.,    Pulmonary exam normal breath sounds clear to auscultation       Cardiovascular Exercise Tolerance: Good hypertension, negative cardio ROS   Rhythm:regular Rate:Normal     Neuro/Psych PSYCHIATRIC DISORDERS Bipolar Disorder negative neurological ROS     GI/Hepatic Neg liver ROS, GERD  Medicated,  Endo/Other  negative endocrine ROSdiabetes, Type 2Morbid obesity  Renal/GU negative Renal ROS  negative genitourinary   Musculoskeletal   Abdominal   Peds  Hematology  (+) Blood dyscrasia, anemia ,   Anesthesia Other Findings   Reproductive/Obstetrics negative OB ROS                             Anesthesia Physical  Anesthesia Plan  ASA: 2  Anesthesia Plan: MAC   Post-op Pain Management:    Induction:   PONV Risk Score and Plan:   Airway Management Planned:   Additional Equipment:   Intra-op Plan:   Post-operative Plan:   Informed Consent: I have reviewed the patients History and Physical, chart, labs and discussed the procedure including the risks, benefits and alternatives for the proposed anesthesia with the patient or authorized representative who has indicated his/her understanding and acceptance.     Dental Advisory Given  Plan Discussed with: CRNA  Anesthesia Plan Comments:         Anesthesia Quick Evaluation

## 2022-03-24 NOTE — Interval H&P Note (Signed)
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  The various methods of treatment have been discussed with the patient and family. After consideration of risks, benefits and other options for treatment, the patient has consented to  Procedure(s) with comments: CATARACT EXTRACTION PHACO AND INTRAOCULAR LENS PLACEMENT ([place]) (Right) - CDE as a surgical intervention.  The patient's history has been reviewed, patient examined, no change in status, stable for surgery.  I have reviewed the patient's chart and labs.  Questions were answered to the patient's satisfaction.     Baruch Goldmann

## 2022-03-24 NOTE — Op Note (Signed)
Date of procedure: 03/24/22  Pre-operative diagnosis:  Visually significant combined form age-related cataract, Right Eye (H25.811)  Post-operative diagnosis:  Visually significant combined form age-related cataract, Right Eye (H25.811)  Procedure: Removal of cataract via phacoemulsification and insertion of intra-ocular lens Rayner [id number] +17.5D into the capsular bag of the Right Eye  Attending surgeon: Gerda Diss. Nancy Arvin, MD, MA  Anesthesia: MAC, Topical Akten  Complications: None  Estimated Blood Loss: <882m (minimal)  Specimens: None  Implants: As above  Indications:  Visually significant age-related cataract, Right Eye  Procedure:  The patient was seen and identified in the pre-operative area. The operative eye was identified and dilated.  The operative eye was marked.  Topical anesthesia was administered to the operative eye.     The patient was then to the operative suite and placed in the supine position.  A timeout was performed confirming the patient, procedure to be performed, and all other relevant information.   The patient's face was prepped and draped in the usual fashion for intra-ocular surgery.  A lid speculum was placed into the operative eye and the surgical microscope moved into place and focused.  A superotemporal paracentesis was created using a 20 gauge paracentesis blade.  Shugarcaine was injected into the anterior chamber.  Viscoelastic was injected into the anterior chamber.  A temporal clear-corneal main wound incision was created using a 2.416mmicrokeratome.  A continuous curvilinear capsulorrhexis was initiated using an irrigating cystitome and completed using capsulorrhexis forceps.  Hydrodissection and hydrodeliniation were performed.  Viscoelastic was injected into the anterior chamber.  A phacoemulsification handpiece and a chopper as a second instrument were used to remove the nucleus and epinucleus. The irrigation/aspiration handpiece was used to remove any  remaining cortical material.   The capsular bag was reinflated with viscoelastic, checked, and found to be intact.  The intraocular lens was inserted into the capsular bag.  The irrigation/aspiration handpiece was used to remove any remaining viscoelastic.  The clear corneal wound and paracentesis wounds were then hydrated and checked with Weck-Cels to be watertight. 0.82m37mf moxifloxacin was injected into the anterior chamber. The lid-speculum was removed.  The drape was removed.  The patient's face was cleaned with a wet and dry 4x4. A clear shield was taped over the eye. The patient was taken to the post-operative care unit in good condition, having tolerated the procedure well.  Post-Op Instructions: [facility] and will receive all other orders and instructions.

## 2022-03-25 ENCOUNTER — Encounter (HOSPITAL_COMMUNITY): Payer: Self-pay | Admitting: Ophthalmology

## 2022-04-17 ENCOUNTER — Inpatient Hospital Stay: Payer: Medicaid Other | Admitting: Pulmonary Disease

## 2022-07-03 DEATH — deceased
